# Patient Record
Sex: Male | Born: 1935
Health system: Southern US, Community
[De-identification: ages and names within clinical notes are randomized; demographics above are authoritative.]

## PROBLEM LIST (undated history)

## (undated) DIAGNOSIS — I350 Nonrheumatic aortic (valve) stenosis: Secondary | ICD-10-CM

## (undated) DIAGNOSIS — Z87442 Personal history of urinary calculi: Secondary | ICD-10-CM

## (undated) DIAGNOSIS — C801 Malignant (primary) neoplasm, unspecified: Secondary | ICD-10-CM

## (undated) DIAGNOSIS — E785 Hyperlipidemia, unspecified: Secondary | ICD-10-CM

## (undated) DIAGNOSIS — E1140 Type 2 diabetes mellitus with diabetic neuropathy, unspecified: Secondary | ICD-10-CM

## (undated) DIAGNOSIS — I10 Essential (primary) hypertension: Secondary | ICD-10-CM

## (undated) DIAGNOSIS — M1990 Unspecified osteoarthritis, unspecified site: Secondary | ICD-10-CM

## (undated) DIAGNOSIS — I2510 Atherosclerotic heart disease of native coronary artery without angina pectoris: Secondary | ICD-10-CM

## (undated) DIAGNOSIS — K219 Gastro-esophageal reflux disease without esophagitis: Secondary | ICD-10-CM

## (undated) DIAGNOSIS — I679 Cerebrovascular disease, unspecified: Secondary | ICD-10-CM

## (undated) DIAGNOSIS — I219 Acute myocardial infarction, unspecified: Secondary | ICD-10-CM

## (undated) DIAGNOSIS — I509 Heart failure, unspecified: Secondary | ICD-10-CM

## (undated) DIAGNOSIS — C4490 Unspecified malignant neoplasm of skin, unspecified: Secondary | ICD-10-CM

## (undated) HISTORY — DX: Essential (primary) hypertension: I10

## (undated) HISTORY — DX: Acute myocardial infarction, unspecified: I21.9

## (undated) HISTORY — DX: Type 2 diabetes mellitus with diabetic neuropathy, unspecified: E11.40

## (undated) HISTORY — DX: Unspecified malignant neoplasm of skin, unspecified: C44.90

## (undated) HISTORY — DX: Cerebrovascular disease, unspecified: I67.9

## (undated) HISTORY — DX: Hyperlipidemia, unspecified: E78.5

## (undated) HISTORY — DX: Nonrheumatic aortic (valve) stenosis: I35.0

## (undated) HISTORY — PX: CATARACT EXTRACTION, BILATERAL: SHX1313

## (undated) HISTORY — DX: Atherosclerotic heart disease of native coronary artery without angina pectoris: I25.10

---

## 1998-03-07 ENCOUNTER — Inpatient Hospital Stay (HOSPITAL_COMMUNITY): Admission: EM | Admit: 1998-03-07 | Discharge: 1998-03-11 | Payer: Self-pay | Admitting: *Deleted

## 2000-10-03 ENCOUNTER — Ambulatory Visit (HOSPITAL_COMMUNITY): Admission: RE | Admit: 2000-10-03 | Discharge: 2000-10-03 | Payer: Self-pay | Admitting: Cardiology

## 2000-10-03 ENCOUNTER — Encounter: Payer: Self-pay | Admitting: Cardiology

## 2004-10-05 ENCOUNTER — Inpatient Hospital Stay (HOSPITAL_COMMUNITY): Admission: EM | Admit: 2004-10-05 | Discharge: 2004-10-08 | Payer: Self-pay | Admitting: *Deleted

## 2004-10-05 ENCOUNTER — Ambulatory Visit: Payer: Self-pay | Admitting: Pulmonary Disease

## 2005-01-21 ENCOUNTER — Ambulatory Visit: Payer: Self-pay | Admitting: Cardiology

## 2005-11-25 ENCOUNTER — Ambulatory Visit: Payer: Self-pay

## 2005-11-25 ENCOUNTER — Ambulatory Visit: Payer: Self-pay | Admitting: Cardiology

## 2005-12-11 ENCOUNTER — Ambulatory Visit (HOSPITAL_COMMUNITY): Admission: RE | Admit: 2005-12-11 | Discharge: 2005-12-11 | Payer: Self-pay | Admitting: Vascular Surgery

## 2005-12-11 HISTORY — PX: OTHER SURGICAL HISTORY: SHX169

## 2006-01-27 ENCOUNTER — Ambulatory Visit: Payer: Self-pay | Admitting: Cardiology

## 2006-12-09 ENCOUNTER — Ambulatory Visit: Payer: Self-pay | Admitting: Cardiology

## 2006-12-18 ENCOUNTER — Ambulatory Visit: Payer: Self-pay

## 2007-12-31 ENCOUNTER — Ambulatory Visit: Payer: Self-pay | Admitting: Cardiology

## 2008-01-12 ENCOUNTER — Ambulatory Visit (HOSPITAL_COMMUNITY): Admission: RE | Admit: 2008-01-12 | Discharge: 2008-01-12 | Payer: Self-pay | Admitting: Cardiology

## 2008-10-26 ENCOUNTER — Encounter (INDEPENDENT_AMBULATORY_CARE_PROVIDER_SITE_OTHER): Payer: Self-pay | Admitting: *Deleted

## 2008-12-23 DIAGNOSIS — Z8679 Personal history of other diseases of the circulatory system: Secondary | ICD-10-CM | POA: Insufficient documentation

## 2008-12-23 DIAGNOSIS — E785 Hyperlipidemia, unspecified: Secondary | ICD-10-CM | POA: Insufficient documentation

## 2008-12-23 DIAGNOSIS — I10 Essential (primary) hypertension: Secondary | ICD-10-CM | POA: Insufficient documentation

## 2008-12-30 ENCOUNTER — Ambulatory Visit: Payer: Self-pay | Admitting: Cardiology

## 2009-01-05 ENCOUNTER — Telehealth (INDEPENDENT_AMBULATORY_CARE_PROVIDER_SITE_OTHER): Payer: Self-pay | Admitting: *Deleted

## 2009-01-09 ENCOUNTER — Encounter: Payer: Self-pay | Admitting: Cardiovascular Disease

## 2009-01-09 ENCOUNTER — Ambulatory Visit: Payer: Self-pay

## 2009-01-09 ENCOUNTER — Encounter: Payer: Self-pay | Admitting: Cardiology

## 2009-07-12 ENCOUNTER — Encounter: Payer: Self-pay | Admitting: Cardiology

## 2009-07-13 ENCOUNTER — Encounter: Payer: Self-pay | Admitting: Cardiology

## 2009-07-13 ENCOUNTER — Ambulatory Visit: Payer: Self-pay

## 2010-01-25 DIAGNOSIS — I2510 Atherosclerotic heart disease of native coronary artery without angina pectoris: Secondary | ICD-10-CM | POA: Insufficient documentation

## 2010-01-29 ENCOUNTER — Encounter: Payer: Self-pay | Admitting: Cardiology

## 2010-01-31 ENCOUNTER — Ambulatory Visit: Payer: Self-pay

## 2010-01-31 ENCOUNTER — Ambulatory Visit: Payer: Self-pay | Admitting: Cardiology

## 2010-07-03 NOTE — Miscellaneous (Signed)
Summary: Orders Update  Clinical Lists Changes  Orders: Added new Test order of Carotid Duplex (Carotid Duplex) - Signed 

## 2010-07-03 NOTE — Assessment & Plan Note (Signed)
Summary: 1 YR/DMP   Visit Type:  1 yr f/u Primary Provider:  Kari Baars  CC:  pt had bilateral cataract surgery back in June 2011...denies any cardiac complaints today.  History of Present Illness: William Walker returns today for evaluation management of his coronary disease and cerebrovascular disease.  He has no angina or ischemic symptoms. He denies symptoms of TIAs or mini strokes. I reviewed these at length with him. Carotid Dopplers today shows stability with this chronic left internal carotid artery occlusion and no flow in the left vertebral.  He still quite active playing golf and farming.  Current Medications (verified): 1)  Lisinopril 20 Mg Tabs (Lisinopril) .Marland Kitchen.. 1 Tab Once Daily 2)  Prevacid 30 Mg Cpdr (Lansoprazole) .... 1/2 Tab Once Daily 3)  Aspirin Ec 325 Mg Tbec (Aspirin) .... Take One Tablet By Mouth Daily 4)  Pravachol 40 Mg Tabs (Pravastatin Sodium) .Marland Kitchen.. 1 Tab Once Daily 5)  Multivitamins   Tabs (Multiple Vitamin) .Marland Kitchen.. 1 Tab Once Daily  Allergies: 1)  ! Pcn  Past History:  Past Medical History: Last updated: 01/25/2010 CAD, NATIVE VESSEL (ICD-414.01) MYOCARDIAL INFARCTION, ANTERIOR WALL/..03/1998 (ICD-410.10) CEREBROVASCULAR DISEASE, HX OF (ICD-V12.50) HYPERTENSION, UNSPECIFIED (ICD-401.9) HYPERLIPIDEMIA-MIXED (ICD-272.4)    Past Surgical History: Last updated: 12/23/2008 Arch cerebral arteriogram. SURGEON:  Larina Earthly, M.D...12/11/2005  Family History: Last updated: 12/23/2008 Family History of Coronary Artery Disease: Fatherin his 29's had CAD.Marland KitchenOtherwise noncontributory  Social History: Last updated: 12/23/2008 Never smoked.  No history of significant occupational exposures.  Clinical Reports Reviewed:  Carotid Doppler:  07/13/2009:  Impressions: Stable 60-79% RICA stenosis. Chronic LICA occlusion. Question of interval left vertebral artery occlusion.  Charlton Haws, MD  01/09/2009:  Impressions: Moderate plaque on the right. 60-79%  RICA stenosis. Chronic LICA occlusion.  Tonny Bollman, MD  01/12/2008:  US Carotid Duplex Bilat   Findings:    RIGHT CAROTID ARTERY: The right carotid bifurcation demonstrates   moderate echogenic and hypoechoic plaque formation.  By velocity   and ratio criteria as well as gray-scale imaging, the right ICA   stenosis is within the 50 - 69% range.  This is primarily based on   the elevated velocity of 155 cm/S. Wave form analysis demonstrates   no significant spectral broadening or turbulent flow.    RIGHT VERTEBRAL ARTERY:  Patent and antegrade    LEFT CAROTID ARTERY: No detectable flow.  Gray scale imaging as   well as Doppler evaluation demonstrates complete occlusion of the   left carotid artery.  This appears chronic.    LEFT VERTEBRAL ARTERY:  Patent and antegrade    IMPRESSION:   Moderate right ICA stenosis within the 50 - 69% range.   Complete chronic-appearing occlusion of the left ICA.   Both vertebral arteries are patent with antegrade flow.    Read By:  Sigurd Sos.,  M.D.   Released By:  Sigurd Sos.,  M.D.  11/25/2005:  Impressions: Mild plaque on the right, significant plaque on the left. 40-59% RICA stenosis 80-99% LICA stenosis with string-sign flow vs. LICA occlusion.  Randa Evens, MD  Nuclear Study:  01/09/2009:  Excerise capacity: Lexiscan  Blood Pressure response: Normal blood pressure response  Clinical symptoms: No chest pain  ECG impression: No significant ST segment change suggestive of ischemia  Overall impression: Normal stres nuclear study.  Dietrich Pates, MD, [facility]   12/18/2006:  Excerise capacity: Dobutamine study with no exercise  Blood Pressure response: Normal blood pressure response  Clinical symptoms: No chest pain  or dyspnea  ECG impression: No significant ST segment change suggestive of ischemia. Burst of SVT noted at peak.  Overall impression: Low risk dobutamine nuclear study as above.  Jonelle Sidle,  MD   11/01/2003:  Impression: Essentially normal Cardiolite study with thinning of the inferior apex but no transmural infarction or ischemia. Gated ejection fraction 66%.  Noralyn Pick. Eden Emms, MD, [facility]  10/03/2000:   REPORT:  CLINICAL DATA:  54-YEAR-OLD GENTLEMAN WITH PRIOR ANTERIOR MYOCARDIAL   INFARCTION TREATED WITH URGENT PERCUTANEOUS INTERVENTION;   HISTORY OF CONGESTIVE HEART FAILURE.   STRESS CARDIOLITE STUDY:   RADIONUCLIDE DATA:  ONE DAY REST/STRESS PROTOCOL PERFORMED WITH 10/29.7 MCI TC   65M SESTAMIBI.   STRESS DATA:  PLEASE SEE SEPARATE REPORT.  THE PATIENT EXERCISED TO 7   METS WITH ADEQUATE HEART RATE RESPONSE AND NO SIGNIFICANT   ELECTROCARDIOGRAPHIC ABNORMALITIES.   SCINTIGRAPHIC DATA:  ACQUISITION WAS TECHNICALLY GOOD WITH NORMAL LEFT   VENTRICULAR SIZE AND BORDERLINE RIGHT VENTRICULAR   ENLARGEMENT.  ON TOMOGRAPHIC IMAGES RECONSTRUCTED IN   STANDARD PLANES, THERE WAS THINNING OF THE INFERIOR WALL   THAT WAS NOT NUMERICALLY SIGNIFICANT BY QUANTITATIVE   ANALYSIS.  ALL OTHER SEGMENTS DEMONSTRATED NORMAL UPTAKE   AND DISTRIBUTION OF TRACER.  THE REST IMAGES WERE   UNCHANGED.  THE GATED 3D IMAGES REVEALED NORMAL REGIONAL  AND GLOBAL LEFT VENTRICULAR SYSTOLIC FUNCTION WITH  AN   ESTIMATED EJECTION FRACTION OF .62.  ON THE GATED   TOMOGRAPHIC IMAGES,  NORMAL SYSTOLIC ACCENTUATION OF   ACTIVITY WAS PRESENT THROUGHOUT.   IMPRESSION:  NEGATIVE STRESS CARDIOLITE STUDY WITH   SOMEWHAT IMPAIRED EXERCISE CAPACITY, NORMAL LEFT   VENTRICULAR SIZE AND FUNCTION AND NEITHER   ELECTROCARDIOGRAPHIC NOR SCINTIGRAPHIC EVIDENCE FOR   MYOCARDIAL ISCHEMIA OR INFARCTION.  OTHER FINDINGS AS   NOTED.  COMPARED WITH A PRIOR STUDY OF 09/13/98, ISCHEMIA   IN THE INFEROAPICAL AND ANTEROAPICAL REGIONS IS NO LONGER   EVIDENT.   TRANSCRIBED DATE:  MA  AS                                           Read ZO:[id number] M.   Dietrich Pates, M.D.  10/07/2000 16:14                                            Released VW:[id number]   M. Rothbart   Review of Systems       negative other than history of present illness  Vital Signs:  Patient profile:   77 year old male Height:      72 inches Weight:      213.8 pounds BMI:     29.10 Pulse rate:   67 / minute Pulse rhythm:   regular BP sitting:   130 / 74  (left arm) Cuff size:   large  Vitals Entered By: Danielle Rankin, CMA (January 31, 2010 11:08 AM)  Physical Exam  General:  Well developed, well nourished, in no acute distress. Head:  normocephalic and atraumatic Eyes:  PERRLA/EOM intact; conjunctiva and lids normal. Neck:  Neck supple, no JVD. No masses, thyromegaly or abnormal cervical nodes. Chest Wall:  no deformities or breast masses noted Lungs:  Clear bilaterally to auscultation and percussion. Heart:  PMI nondisplaced, regular rate and rhythm, normal  S1-S2 is split, no gallop, curve shows equal bilaterally with the soft right carotid bruit. Abdomen:  Bowel sounds positive; abdomen soft and non-tender without masses, organomegaly, or hernias noted. No hepatosplenomegaly. Msk:  decreased ROM.   Pulses:  pulses normal in all 4 extremities Extremities:  dependent rubor, mildly delayed capillary reflex, varicose veins without sign of DVT, trace edema Neurologic:  Alert and oriented x 3. Skin:  Intact without lesions or rashes. Psych:  Normal affect.   EKG  Procedure date:  01/31/2010  Findings:      normal sinus rhythm, normal EKG  Impression & Recommendations:  Problem # 1:  CAD, NATIVE VESSEL (ICD-414.01) Assessment Unchanged  His updated medication list for this problem includes:    Lisinopril 20 Mg Tabs (Lisinopril) .Marland Kitchen... 1 tab once daily    Aspirin Ec 325 Mg Tbec (Aspirin) .Marland Kitchen... Take one tablet by mouth daily  Orders: EKG w/ Interpretation (93000)  Problem # 2:  MYOCARDIAL INFARCTION, ANTERIOR WALL/..03/1998 (ICD-410.10) Assessment: Unchanged  His updated medication list for this problem includes:     Lisinopril 20 Mg Tabs (Lisinopril) .Marland Kitchen... 1 tab once daily    Aspirin Ec 325 Mg Tbec (Aspirin) .Marland Kitchen... Take one tablet by mouth daily  Problem # 3:  CEREBROVASCULAR DISEASE, HX OF (ICD-V12.50) Assessment: Unchanged  Problem # 4:  HYPERTENSION, UNSPECIFIED (ICD-401.9) Assessment: Improved  His updated medication list for this problem includes:    Lisinopril 20 Mg Tabs (Lisinopril) .Marland Kitchen... 1 tab once daily    Aspirin Ec 325 Mg Tbec (Aspirin) .Marland Kitchen... Take one tablet by mouth daily  Problem # 5:  HYPERLIPIDEMIA-MIXED (ICD-272.4)  His updated medication list for this problem includes:    Pravachol 40 Mg Tabs (Pravastatin sodium) .Marland Kitchen... 1 tab once daily  Patient Instructions: 1)  Your physician recommends that you schedule a follow-up appointment in: 1 year with Dr. Daleen Squibb and Carotid ultrasound 2)  Your physician recommends that you continue on your current medications as directed. Please refer to the Current Medication list given to you today.

## 2010-10-16 NOTE — Assessment & Plan Note (Signed)
[facility]                            CARDIOLOGY OFFICE NOTE   NAASIR, CARREIRA                        MRN:          [id number]  DATE:12/31/2007                            DOB:          [birth year]    Mr. William Walker returns today for the following problems:  1. Coronary artery disease status post large anterior wall infarct,      October 1999.  He has had full recovery of his left ventricular      function, EF is 66%.  Last stress Myoview December 18, 2006, with an EF      of 70%.  He did not like the dobutamine or like to do adenosine      next time.  He is having no signs of angina.  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  2. Hypertension, followed by Dr. Juanetta Gosling.  3. Hyperlipidemia.  He brings lipids from Dr. Juanetta Gosling' office, which      looked consistent and quite good.  4. History of cerebrovascular disease.  He is not having any symptoms      of transient ischemic attacks or stroke.  He has had a study in the      past which showed a widely patent right internal carotid artery,      large right vertebral with a 50% stenosis at the origin, occluded      left internal carotid, and occluded left vertebral.  Again, he is      asymptomatic.   CURRENT MEDICATIONS:  1. Lisinopril 20 mg a day.  2. Prevacid 30 mg a day.  3. Aspirin 325 a day.  4. Pravachol 40 mg a day.   PHYSICAL EXAMINATION:  VITAL SIGNS:  His blood pressure today is 135/79,  his pulse is 77 and regular.  His EKG is stable.  His weight is 215.  HEENT:  Unchanged.  Carotids upstrokes were equal bilaterally except for  slight decrease on the left.  There are no bruits.  Thyroid is not  enlarged.  Trachea is midline.  NECK:  Supple.  LUNGS:  Clear.  HEART:  Reveals a regular rate and rhythm.  No gallop or murmur.  ABDOMEN:  Soft, good bowel sounds.  No midline bruits.  EXTREMITIES:  No cyanosis, clubbing, or edema.  Pulses were present, but  reduced.  He has  trace edema.  NEURO:  Intact.   William Walker is doing well.  Other than the need for some screening carotid  Dopplers, we have made no other recommendations.  I will plan on seeing  him back again in a year.     Thomas C. Daleen Squibb, MD, [facility]  Electronically Signed   TCW/MedQ  DD: 12/31/2007  DT: 01/01/2008  Job #: [id number]   cc:   Ramon Dredge L. Juanetta Gosling, M.D.

## 2010-10-16 NOTE — Assessment & Plan Note (Signed)
[facility]                            CARDIOLOGY OFFICE NOTE   William Walker                        MRN:          [id number]  DATE:12/09/2006                            DOB:          02/27/1927    William Walker returns today for further management of the following issues:  1. Coronary artery disease, status post large anterior wall infarct,      October 1999.  He is having no symptoms of angina or ischemia.  He      had remarkable recovery of the LV function with an EF of 66% by      last Myoview.  He is due a stress followup.  He does not like      adenosine!  2. Hypertension.  3. Hyperlipidemia.  His lipids are remarkably good and at goal with      William Walker.  4. History of vascular disease.  He has had a cerebrovascular study by      William Walker, December 11, 2005, which showed a widely patent right      internal carotid artery, large right vertebral with a 50% stenosis      at the origin, and occluded left internal carotid artery, and an      occluded left vertebral.  He is asymptomatic.  Symptoms of a TIA      and stroke were reviewed with him today, as well as how to call      911.   His meds are Lisinopril 20 mg a day, Prevacid 30 mg a day, aspirin 325 a  day, Pravachol 40 mg a day, and Metanx which is a nutritional  supplement.   His blood pressure today is 140/76, pulse 67 and regular.  His weight is  213.  HEENT:  Normocephalic, atraumatic.  PERRLA, extraocular movements  intact.  Sclerae are clear.  Facial symmetry is normal.  Carotids are  full.  He has bilateral carotid sounds.  Thyroid is not enlarged,  trachea is midline.  LUNGS:  Clear.  HEART:  Reveals a regular rate and rhythm without murmur or gallop.  PMI  is nondisplaced.  ABDOMINAL EXAM:  Soft with good bowel sounds, no midline bruit.  EXTREMITIES:  Reveal no edema.  Pulses are intact.  NEURO EXAM:  Intact.  SKIN:  Shows a few ecchymoses.   Electrocardiogram shows sinus  rhythm, first degree AV block.  He has  some nonspecific ST segment changes inferiorly.  His __________ criteria  for LVH.  There has been no significant change.   ASSESSMENT AND PLAN:  William Walker is doing well.  I have asked him to  return for a dobutamine stress Myoview.  He does not like adenosine.  If  this is negative for ischemia, we will see him back in a year.     Thomas C. Daleen Squibb, MD, [facility]  Electronically Signed   TCW/MedQ  DD: 12/09/2006  DT: 12/10/2006  Job #: [id number]   cc:   William Walker, M.D.

## 2010-10-19 NOTE — Discharge Summary (Signed)
NAME:  William Walker, William Walker NO.:  [id number]   MEDICAL RECORD NO.:  [id number]          PATIENT TYPE:  INP   LOCATION:  3031                         FACILITY:  [facility]   PHYSICIAN:  Oley Balm. Sung Amabile, M.D. [facility] OF BIRTH:  [birth year]   DATE OF ADMISSION:  10/05/2004  DATE OF DISCHARGE:  10/08/2004                                 DISCHARGE SUMMARY   DISCHARGE DIAGNOSES:  1.  Urinary tract infection.  2.  History of kidney stones.  3.  Low-grade sepsis.   LABORATORY DATA:  On Oct 05, 2004, urinary culture assessment:  Greater than  100,000 colonies of Escherichia coli.  Sensitivities less than 0.25  sensitive to ciprofloxacin.  Blood culture date Oct 05, 2004 with 1 out of 2  cultures with gram-positive cocci in clusters, currently final culture date  pending.  Laboratory data date Oct 07, 2004:  Sodium 135, potassium 3.8,  chloride 107, CO2 24, glucose 89, BUN 14, creatinine 1.3. Complete blood  count date Oct 07, 2004.  White blood cell count 9.7, hemoglobin 11.8,  platelets 142.  On Oct 05, 2004, white blood cell count 11.3.  On Oct 05, 2004, BUN 21, creatinine 1.9.  Lactic acid 3.3.  INR 1.3.   BRIEF HISTORY:  William Walker is a 77 year old gentleman who usually enjoys good  health.  He presented with a 24-hour history of shaking chills and dysuria.  He was consequently brought to the emergency room on the morning of  admission for further evaluation.  Upon arrival he was noted to be  hypotensive and confused.  He received normal saline resuscitation with  improvement in his blood pressure and improvement in his cognitive function.  Upon arrival his temperature was 102.9.  he improved with Tylenol.  No other  complaints.  He denied any other significant pertinent positives for review  of systems.   PAST MEDICAL HISTORY:  Significant for:  1.  Myocardial infarction in 1999.  2.  Hypertension.  3.  Hyperlipidemia.  4.  Diabetes.  5.  Peripheral neuropathy.  6.  Remote  history of kidney stones.   He was admitted to the critical care service for low-grade sepsis and  urinary tract infection as well as volume response to septic shock.   HOSPITAL COURSE:  Problem #1.  Urinary tract infection.  Urinary cultures  were obtained.  Cultures were positive for E coli.  Sensitivities sensitive  to ciprofloxacin with less than 0.025 MIC.  The patient is now on day #3 of  Cipro.  Will go home on 14-day course.   Problem #2.  Low-grade sepsis.  Septic shock.  Easily resolved with volume  resuscitation.  BUN and creatinine are returning to baseline.  Vital signs  are now stable.   On physical examination on discharge, the patient has now been afebrile for  over 24 hours.  His temperature is 98.2, pulse 69,  respirations 18,  saturations 95% on room air.  Blood pressure 116/67.  Breath sounds are  clear.  Heart tones are normal.  Abdomen is soft, nontender.  Denies  dysuria.  Did have Foley catheter discontinued yesterday.  Does still  continue to complain of urinary frequency.   DISCHARGE MEDICATIONS:  1.  Ciprofloxacin 500 mg tablets one tablet twice a day x 11 more days.  2.  Aspirin 325 mg one a day.  3.  Prevacid 30 mg a day.   The patient following medications will be held and the patient has been  instructed to discuss with Dr. Juanetta Gosling upon further followup.  Glucotrol XL,  Lipitor, lisinopril.   ACTIVITY:  Without restrictions.   DIET:  No concentrated sweets.  The patient is instructed to check his  fasting blood glucose upon arising each morning.   FOLLOW UP:  The patient has been instructed to make followup appointment  with Dr. Juanetta Gosling in one week.  Phone number was provided.  We were unable to  make followup appointment for him as Dr. Juanetta Gosling' office is closed on  Monday.      PB/MEDQ  D:  10/08/2004  T:  10/08/2004  Job:  [id number]   cc:   Ramon Dredge L. Juanetta Gosling, M.D.  [address]  [place]  [place] [address]  Fax: [phone]

## 2010-10-19 NOTE — H&P (Signed)
NAME:  William Walker, William Walker NO.:  [id number]   MEDICAL RECORD NO.:  [id number]          PATIENT TYPE:  INP   LOCATION:  1824                         FACILITY:  [facility]   PHYSICIAN:  Oley Balm. Sung Amabile, M.D. [facility] OF BIRTH:  Jan 09, 1927   DATE OF ADMISSION:  10/05/2004  DATE OF DISCHARGE:                                HISTORY & PHYSICAL   ADMISSION DIAGNOSES:  1.  Urinary tract infection.  2.  History of kidney stones.  3.  Low grade sepsis.   HISTORY OF PRESENT ILLNESS:  William Walker is a 77 year old gentleman who usually  enjoys good health.  He presents with a 24-hour history of shaking chills  and dysuria.  He was consequently brought to the emergency department on the  morning of this admission for further evaluation.  Upon arrival he was noted  to be hypotensive and confused.  He received normal saline resuscitation  with improvement in his blood pressure and cognitive function.  Upon arrival  his temperature was 102.9 which has now improved with Tylenol.  He has no  other complaints.  At his baseline he enjoys robust health.  He denies sore  throat, neck stiffness, chest pain, shortness of breath, cough, sputum  production, hemoptysis, abdominal pain, nausea, vomiting, diarrhea, lower  extremity edema, and calf tenderness.   PAST MEDICAL HISTORY:  1.  Myocardial infarction in 1999.  2.  Hypertension.  3.  Hyperlipidemia.  4.  Diabetes.  5.  Peripheral neuropathy.  6.  Remote history of kidney stones.   CURRENT MEDICATIONS:  1.  Glucotrol XL 2.5 mg daily.  2.  Lipitor 10 mg daily.  3.  Lisinopril 40 mg daily.  4.  Prevacid 30 mg daily.  5.  Aspirin one daily.   SOCIAL HISTORY:  Never smoked.  No history of significant occupational  exposures.   FAMILY HISTORY:  Noncontributory.   REVIEW OF SYSTEMS:  As per the history of present illness.  Otherwise a  detailed review of systems is negative.   PHYSICAL EXAMINATION:  VITAL SIGNS:  Initial temperature  102.9, current  temperature 100-100.4.  Initial blood pressure 70/40, current blood pressure  and 98/60, pulse 96 (sinus rhythm), respirations 16, oxygen saturation 100%.  GENERAL:  He is well-developed, well-nourished, in no acute cardiac or  respiratory distress.  He is alert and oriented.  HEENT:  No acute abnormalities.  NECK:  Supple without adenopathy or jugular venous distension.  CHEST:  Full breath sounds without wheezes or other adventitious sounds.  CARDIAC:  Regular rate and rhythm.  No murmurs.  ABDOMEN:  Soft, nontender with normal bowel sounds.  EXTREMITIES:  Without clubbing, cyanosis, and edema.  Peripheral pulses are  full.  Capillary refill is normal.  NEUROLOGIC:  No focal deficits.   DATA:  Chest x-ray reveals no acute cardiac or respiratory disease.  EKG  reveals no acute ischemic changes.  Chemistries are pending.  Urinalysis  reveals too numerous to count white blood cells and many bacteria.  White  blood cell count is 11,300.   IMPRESSION:  1.  Urinary tract infection  with low grade sepsis.  2.  History of kidney stones.  3.  Type 2 diabetes.  4.  Hypertension and hyperlipidemia.  5.  History of myocardial infarction.   PLAN:  He will be admitted to the step-down unit and treated with  intravenous antibiotics - ciprofloxacin.  He did receive a dose of  vancomycin in the emergency department but I think we can hold on further  vancomycin for now pending culture results.  He will continue to receive  intravenous fluids for resuscitation.  Antihypertensives will be held.  The  rest of his medical regimen will be continued as is.  He will be covered  with sliding scale insulin.  DVT prophylaxis will be provided.  I do not  believe that he warrants the full sepsis protocol at this time given his  rapid improvement to intravenous fluids.  I do not believe he needs an ICU  level of care for the same reasons and therefore he is admitted to the step-  down  unit.      DBS/MEDQ  D:  10/05/2004  T:  10/05/2004  Job:  [id number]   cc:   Ramon Dredge L. Juanetta Gosling, M.D.  [address]  [place]  [place] [address]  Fax: [phone]

## 2010-10-19 NOTE — Assessment & Plan Note (Signed)
[facility]                              CARDIOLOGY OFFICE NOTE   RAAHIL, ONG                        MRN:          [id number]  DATE:01/27/2006                            DOB:          Mar 28, 1927    William Walker returns today after being evaluated for cerebrovascular disease by  Dr. Gretta Began.  Please see the angiogram report from December 11, 2005.  He is  a widely patent right internal carotid, large right vertebral with a 50%  stenosis at the origin and occluded left internal carotid and an occluded  left vertebral.   He is currently asymptomatic.  We are treating him medically.   His blood pressure today is 146/82, pulse 84 and regular. His weight is 219.  The rest of his exam is unchanged.   I reviewed extensively with Mr. and Mrs. Busey the symptoms of an acute  ischemic stroke.  He knows how to react to this, calling 911.   We will plan on seeing him back again in May 2008.  At that time he will  need a stress Myoview.  We made no changes in his medical program.  Dr.  Juanetta Gosling continues to follow his blood work.                               Thomas C. Daleen Squibb, MD, [facility]    TCW/MedQ  DD:  01/27/2006  DT:  01/28/2006  Job #:  [id number]   cc:   Larina Earthly, MD  [place] Deputy Juanetta Gosling, MD

## 2010-10-19 NOTE — Op Note (Signed)
NAME:  KOTARO, BUER NO.:  [id number]   MEDICAL RECORD NO.:  [id number]          PATIENT TYPE:  AMB   LOCATION:  SDS                          FACILITY:  [facility]   PHYSICIAN:  Larina Earthly, M.D.    DATE OF BIRTH:  1927-04-17   DATE OF PROCEDURE:  12/11/2005  DATE OF DISCHARGE:                                 OPERATIVE REPORT   PREOPERATIVE DIAGNOSIS:  Asymptomatic extracranial cerebrovascular occlusive  disease.   POSTOPERATIVE DIAGNOSIS:  Asymptomatic extracranial cerebrovascular  occlusive disease.   PROCEDURES:  Arch cerebral arteriogram.   SURGEON:  Larina Earthly, M.D.   ANESTHESIA:  1% lidocaine local.   COMPLICATIONS:  None.   DISPOSITION:  To holding area, stable.   PROCEDURE IN DETAIL:  The patient was taken to the peripheral vascular cath  lab and placed in supine position, where the area of the right and left  groins were prepped and draped in usual sterile fashion.  Using local  anesthesia and a single-wall puncture, the right common femoral artery was  entered and a guidewire was passed up to the level of the aortic arch.  A 5-  French sheath was passed over the guidewire and a pigtail catheter was  positioned at the level of the ascending aorta.  A 40-degree LAO projection  was undertaken.  This revealed the bovine arch was widely patent great  vessels proximally.  There was occlusion of the left vertebral artery, a  large right vertebral artery with a moderate 50% stenosis at its origin.  A  Simmons catheter was used to selectively catheterize the innominate artery.  AP and lateral projections were undertaken, again showing a large vertebral  artery with moderate proximal stenosis.  The intracranial injection revealed  excellent cross-filling from right to left and intracranial views will be  dictated as a separate note by [facility].  The left common  carotid artery was then selectively catheterized with the [facility] catheter.  AP  and lateral cervical and intracranial views were undertaken.  This  revealed complete occlusion of the internal carotid artery at its origin.  There was large external carotid with no good intracranial feeling with the  common carotid injection.  Next, the right lateral projections were  undertaken, revealing widely patent common carotid, internal and external  carotid arteries.  The patient tolerated the procedure without immediate  complications and was transferred to the holding area in stable condition.   FINDINGS:  1.  Widely patent right internal carotid artery.  2.  Large right vertebral artery with 50% stenosis at its origin.  3.  Occluded internal carotid artery on the left.  4.  Occluded left vertebral artery.      Larina Earthly, M.D.  Electronically Signed     TFE/MEDQ  D:  12/11/2005  T:  12/11/2005  Job:  [id number]   cc:   Thomas C. Wall, M.D.  [address]  [address]  [place]  [place] [address]   Oneal Deputy. Juanetta Gosling, M.D.  Fax: [phone]

## 2011-01-24 ENCOUNTER — Encounter: Payer: Self-pay | Admitting: Cardiology

## 2011-02-11 ENCOUNTER — Other Ambulatory Visit: Payer: Self-pay | Admitting: Cardiology

## 2011-02-11 DIAGNOSIS — I6529 Occlusion and stenosis of unspecified carotid artery: Secondary | ICD-10-CM

## 2011-02-12 ENCOUNTER — Ambulatory Visit (INDEPENDENT_AMBULATORY_CARE_PROVIDER_SITE_OTHER): Payer: Medicare Other | Admitting: Cardiology

## 2011-02-12 ENCOUNTER — Encounter: Payer: Self-pay | Admitting: Cardiology

## 2011-02-12 ENCOUNTER — Encounter (INDEPENDENT_AMBULATORY_CARE_PROVIDER_SITE_OTHER): Payer: Medicare Other | Admitting: *Deleted

## 2011-02-12 VITALS — BP 146/76 | HR 70 | Ht 71.0 in | Wt 211.0 lb

## 2011-02-12 DIAGNOSIS — I2510 Atherosclerotic heart disease of native coronary artery without angina pectoris: Secondary | ICD-10-CM

## 2011-02-12 DIAGNOSIS — Z8679 Personal history of other diseases of the circulatory system: Secondary | ICD-10-CM

## 2011-02-12 DIAGNOSIS — I6529 Occlusion and stenosis of unspecified carotid artery: Secondary | ICD-10-CM

## 2011-02-12 MED ORDER — NITROGLYCERIN 0.4 MG/SPRAY TL SOLN: 1.0000 | Status: DC | PRN

## 2011-02-12 NOTE — Assessment & Plan Note (Signed)
Stable. Symptoms reviewed. How to respond also reinforced.

## 2011-02-12 NOTE — Assessment & Plan Note (Signed)
Stable. Continue current medications. Nitroglycerin updated.

## 2011-02-12 NOTE — Progress Notes (Signed)
HPI William Walker returns today for evaluation and management coronary artery disease history of MI. He also has a history of severe true vascular disease has been stable. Carotid Dopplers today are stable. He is a chronically occluded left internal carotid artery 60-79% right internal carotid artery stenosis, and no flow in his left vertebral. Followup recommended in one year. He is having no symptoms of TIAs.  He denies any symptoms of angina or any chest discomfort. He remains very active. Past Medical History  Diagnosis Date  . CAD in native artery   . MI (myocardial infarction)   . CVD (cerebrovascular disease)   . HTN (hypertension)   . Hyperlipidemia     Past Surgical History  Procedure Date  . Arch cerebral ateriogram 12/11/2005    by Larina Earthly MD    Family History  Problem Relation Age of Onset  . Coronary artery disease Father     History   Social History  . Marital Status: Married    Spouse Name: N/A    Number of Children: N/A  . Years of Education: N/A   Occupational History  . Not on file.   Social History Main Topics  . Smoking status: Never Smoker   . Smokeless tobacco: Not on file  . Alcohol Use: Not on file  . Drug Use: Not on file  . Sexually Active: Not on file   Other Topics Concern  . Not on file   Social History Narrative  . No narrative on file    Allergies  Allergen Reactions  . Penicillins     Current Outpatient Prescriptions  Medication Sig Dispense Refill  . aspirin EC 325 MG tablet Take 325 mg by mouth daily.        . lansoprazole (PREVACID) 30 MG capsule Take 15 mg by mouth daily.        Marland Kitchen lisinopril (PRINIVIL,ZESTRIL) 20 MG tablet Take 20 mg by mouth daily.        . pravastatin (PRAVACHOL) 40 MG tablet Take 40 mg by mouth daily.          ROS Negative other than HPI.   PE General Appearance: well developed, well nourished in no acute distress HEENT: symmetrical face, PERRLA, good dentition  Neck: no JVD, thyromegaly, or  adenopathy, trachea midline Chest: symmetric without deformity Cardiac: PMI non-displaced, RRR, normal S1, S2, no gallop or murmur Lung: clear to ausculation and percussion Vascular: right carotid bruit  Abdominal: nondistended, nontender, good bowel sounds, no HSM, no bruits Extremities: no cyanosis, clubbing or edema, no sign of DVT, no varicosities  Skin: normal color, no rashes Neuro: alert and oriented x 3, non-focal Pysch: normal affect Filed Vitals:   02/12/11 1011  BP: 146/76  Pulse: 70  Height: 5\' 11"  (1.803 m)  Weight: 211 lb (95.709 kg)    EKG  Labs and Studies Reviewed.   No results found for this basename: WBC, HGB, HCT, MCV, PLT      Chemistry   No results found for this basename: NA, K, CL, CO2, BUN, CREATININE, GLU   No results found for this basename: CALCIUM, ALKPHOS, AST, ALT, BILITOT       No results found for this basename: CHOL   No results found for this basename: HDL   No results found for this basename: LDLCALC   No results found for this basename: TRIG   No results found for this basename: CHOLHDL   No results found for this basename: HGBA1C   No  results found for this basename: ALT, AST, GGT, ALKPHOS, BILITOT   No results found for this basename: TSH

## 2011-02-12 NOTE — Patient Instructions (Addendum)
Your physician recommends that you schedule a follow-up appointment in: 1 year with Dr. Daleen Squibb  Your physician has requested that you have a carotid duplex. This test is an ultrasound of the carotid arteries in your neck. It looks at blood flow through these arteries that supply the brain with blood. Allow one hour for this exam. There are no restrictions or special instructions. September 2013

## 2012-03-19 ENCOUNTER — Telehealth: Payer: Self-pay | Admitting: *Deleted

## 2012-03-19 DIAGNOSIS — I6529 Occlusion and stenosis of unspecified carotid artery: Secondary | ICD-10-CM

## 2012-03-19 NOTE — Telephone Encounter (Signed)
I spoke with pt today about upcoming appointment 04/01/12 with Dr. Daleen Squibb. He is due carotids. Scheduled for 03/26/12 carotids orders placed Mylo Red RN

## 2012-03-26 ENCOUNTER — Encounter (INDEPENDENT_AMBULATORY_CARE_PROVIDER_SITE_OTHER): Payer: Medicare Other

## 2012-03-26 DIAGNOSIS — I6529 Occlusion and stenosis of unspecified carotid artery: Secondary | ICD-10-CM

## 2012-04-01 ENCOUNTER — Ambulatory Visit (INDEPENDENT_AMBULATORY_CARE_PROVIDER_SITE_OTHER): Payer: Medicare Other | Admitting: Cardiology

## 2012-04-01 ENCOUNTER — Encounter: Payer: Self-pay | Admitting: Cardiology

## 2012-04-01 VITALS — BP 140/73 | HR 73 | Ht 72.0 in | Wt 207.0 lb

## 2012-04-01 DIAGNOSIS — G609 Hereditary and idiopathic neuropathy, unspecified: Secondary | ICD-10-CM

## 2012-04-01 DIAGNOSIS — I6529 Occlusion and stenosis of unspecified carotid artery: Secondary | ICD-10-CM

## 2012-04-01 DIAGNOSIS — I2510 Atherosclerotic heart disease of native coronary artery without angina pectoris: Secondary | ICD-10-CM

## 2012-04-01 DIAGNOSIS — E785 Hyperlipidemia, unspecified: Secondary | ICD-10-CM

## 2012-04-01 DIAGNOSIS — I739 Peripheral vascular disease, unspecified: Secondary | ICD-10-CM | POA: Insufficient documentation

## 2012-04-01 DIAGNOSIS — I779 Disorder of arteries and arterioles, unspecified: Secondary | ICD-10-CM | POA: Insufficient documentation

## 2012-04-01 DIAGNOSIS — G629 Polyneuropathy, unspecified: Secondary | ICD-10-CM | POA: Insufficient documentation

## 2012-04-01 DIAGNOSIS — I10 Essential (primary) hypertension: Secondary | ICD-10-CM

## 2012-04-01 NOTE — Assessment & Plan Note (Signed)
Stable. Continue secondary preventative therapy. 

## 2012-04-01 NOTE — Patient Instructions (Addendum)
Your physician recommends that you continue on your current medications as directed. Please refer to the Current Medication list given to you today.  Your physician has requested that you have a carotid duplex in 6 months.. This test is an ultrasound of the carotid arteries in your neck. It looks at blood flow through these arteries that supply the brain with blood. Allow one hour for this exam. There are no restrictions or special instructions.  Your physician wants you to follow-up in: 1 year with Dr. Daleen Squibb.  You will receive a reminder letter in the mail two months in advance. If you don't receive a letter, please call our office to schedule the follow-up appointment.

## 2012-04-01 NOTE — Assessment & Plan Note (Signed)
Asymptomatic but some progression on recent study. Repeat study in 6 months. Continue secondary preventative therapy.

## 2012-04-01 NOTE — Assessment & Plan Note (Signed)
This is most likely secondary to age and small vessel disease. I've advised him to wear shoes at all times other than in the house. I've also advised him to not trim his toenails too close.

## 2012-04-01 NOTE — Assessment & Plan Note (Signed)
Plan per note under  peripheral neuropathy.

## 2012-04-01 NOTE — Progress Notes (Signed)
HPI William Walker returns today for evaluation and management of his history of coronary artery disease, history of anterior William Walker infarction and percutaneous intervention. He also has carotid disease. Recent carotids showed slight progression of his disease. He is totally asymptomatic.  He denies any angina or symptoms of ischemia. He is compliant with his meds. Denies orthopnea, PND or edema.  His biggest complaint is the fact that his feet got burned and healed at the beach. He denies any claudication or any tissue injury otherwise. He does have significant dependent rubor and he has a nonspecific neuropathy with tingling and numbness in his feet.  Past Medical History  Diagnosis Date  . CAD in native artery   . MI (myocardial infarction)   . CVD (cerebrovascular disease)   . HTN (hypertension)   . Hyperlipidemia     Current Outpatient Prescriptions  Medication Sig Dispense Refill  . aspirin EC 325 MG tablet Take 325 mg by mouth daily.        . lansoprazole (PREVACID) 30 MG capsule Take 15 mg by mouth daily.        Marland Kitchen lisinopril (PRINIVIL,ZESTRIL) 20 MG tablet Take 20 mg by mouth daily.        . pravastatin (PRAVACHOL) 40 MG tablet Take 40 mg by mouth daily.        . traMADol (ULTRAM) 50 MG tablet Take 50 mg by mouth every 6 (six) hours as needed.        Allergies  Allergen Reactions  . Penicillins     Family History  Problem Relation Age of Onset  . Coronary artery disease Father     History   Social History  . Marital Status: Married    Spouse Name: N/A    Number of Children: N/A  . Years of Education: N/A   Occupational History  . Not on file.   Social History Main Topics  . Smoking status: Never Smoker   . Smokeless tobacco: Not on file  . Alcohol Use: Not on file  . Drug Use: Not on file  . Sexually Active: Not on file   Other Topics Concern  . Not on file   Social History Narrative  . No narrative on file    ROS ALL NEGATIVE EXCEPT THOSE NOTED IN  HPI  PE  General Appearance: well developed, well nourished in no acute distress HEENT: symmetrical face, PERRLA, good dentition  Neck: no JVD, thyromegaly, or adenopathy, trachea midline Chest: symmetric without deformity Cardiac: PMI non-displaced, RRR, normal S1, S2, no gallop or murmur Lung: clear to ausculation and percussion Vascular: all pulses full without bruits  Abdominal: nondistended, nontender, good bowel sounds, no HSM, no bruits Extremities: no cyanosis, clubbing or edema, no sign of DVT, no varicosities, dependent rubor with cool toes but good dorsalis pedis and posterior tibial pulses bilaterally. He has decreased capillary refill.  Skin: normal color, no rashes Neuro: alert and oriented x 3, non-focal Pysch: normal affect  EKG Normal sinus rhythm with first-degree AV block, left axis deviation, no change. BMET No results found for this basename: na, k, cl, co2, glucose, bun, creatinine, calcium, gfrnonaa, gfraa    Lipid Panel  No results found for this basename: chol, trig, hdl, cholhdl, vldl, ldlcalc    CBC No results found for this basename: wbc, rbc, hgb, hct, plt, mcv, mch, mchc, rdw, neutrabs, lymphsabs, monoabs, eosabs, basosabs

## 2012-12-03 ENCOUNTER — Encounter (HOSPITAL_COMMUNITY): Payer: Self-pay

## 2012-12-03 ENCOUNTER — Encounter (HOSPITAL_COMMUNITY): Payer: Self-pay | Admitting: Pharmacy Technician

## 2012-12-03 ENCOUNTER — Encounter (HOSPITAL_COMMUNITY)
Admission: RE | Admit: 2012-12-03 | Discharge: 2012-12-03 | Disposition: A | Discharge status: Home / Self Care | Payer: Medicare Other | Source: Ambulatory Visit | Attending: General Surgery | Admitting: General Surgery

## 2012-12-03 HISTORY — DX: Gastro-esophageal reflux disease without esophagitis: K21.9

## 2012-12-03 LAB — CBC WITH DIFFERENTIAL/PLATELET
Basophils Relative: 0 % (ref 0–1)
Eosinophils Absolute: 0.5 10*3/uL (ref 0.0–0.7)
HCT: 40.8 % (ref 39.0–52.0)
Hemoglobin: 14 g/dL (ref 13.0–17.0)
Lymphs Abs: 2.7 10*3/uL (ref 0.7–4.0)
MCH: 34.7 pg — ABNORMAL HIGH (ref 26.0–34.0)
MCHC: 34.3 g/dL (ref 30.0–36.0)
Monocytes Absolute: 0.5 10*3/uL (ref 0.1–1.0)
Monocytes Relative: 7 % (ref 3–12)
RBC: 4.03 MIL/uL — ABNORMAL LOW (ref 4.22–5.81)

## 2012-12-03 LAB — BASIC METABOLIC PANEL
BUN: 26 mg/dL — ABNORMAL HIGH (ref 6–23)
CO2: 27 mEq/L (ref 19–32)
Chloride: 101 mEq/L (ref 96–112)
Glucose, Bld: 187 mg/dL — ABNORMAL HIGH (ref 70–99)
Potassium: 4.4 mEq/L (ref 3.5–5.1)

## 2012-12-03 NOTE — H&P (Signed)
  NTS SOAP Note  Vital Signs:  Vitals as of: 12/03/2012: Systolic 135: Diastolic 72: Heart Rate 81: Temp 99.35F: Height 55ft 10in: Weight 208Lbs 0 Ounces: Pain Level 5: BMI 29.84  BMI : 29.84 kg/m2  Subjective: This 4 Years 4 Months old Male presents for of    HERNIA: ,Has had a left inguinal hernia for a long time, but recently it has become difficult to reduce.  Causes some mild discomfort.  Review of Symptoms:  Constitutional:unremarkable   Head:unremarkable    Eyes:unremarkable   Nose/Mouth/Throat:unremarkable Cardiovascular:  unremarkable   Respiratory:unremarkable   Gastrointestinal:  unremarkable   Genitourinary:unremarkable     Skin:unremarkable Breast:  negative Allergic/Immunologic:unremarkable     Past Medical History:    Reviewed   Past Medical History  Surgical History: none Medical Problems: carotid artery occlussion on left, HTN, high cholesterol, coronary stent placement in 1999 Allergies: PCN Medications: lisinopril, prevachol, tramadol, prevacid   Social History:Reviewed  Social History  Preferred Language: English Race:  White Ethnicity: Not Hispanic / Latino Age: 79 Years 4 Months Marital Status:  S Alcohol:  No Recreational drug(s):  No   Smoking Status: Never smoker reviewed on 12/03/2012 Functional Status reviewed on mm/dd/yyyy ------------------------------------------------ Bathing: Normal Cooking: Normal Dressing: Normal Driving: Normal Eating: Normal Managing Meds: Normal Oral Care: Normal Shopping: Normal Toileting: Normal Transferring: Normal Walking: Normal Cognitive Status reviewed on mm/dd/yyyy ------------------------------------------------ Attention: Normal Decision Making: Normal Language: Normal Memory: Normal Motor: Normal Perception: Normal Problem Solving: Normal Visual and Spatial: Normal   Family History:  Reviewed  Family Health History Mother, Deceased;  Healthy;  Father, Deceased; Healthy;     Objective Information: General:  Well appearing, well nourished in no distress.   No right carotid bruit.  No pulse in left carotid. Heart:  RRR, no murmur or gallop.  Normal S1, S2.  No S3, S4.  Lungs:    CTA bilaterally, no wheezes, rhonchi, rales.  Breathing unlabored. Abdomen:Soft, NT/ND, no HSM, no masses.  Large reducible left inguinal hernia noted. WU:[id number]    Assessment:Left inguinal hernia  Diagnosis &amp; Procedure Smart Code   Plan:Scheduled for left inguinal herniorrhaphy on 12/09/12.   Patient Education:Alternative treatments to surgery were discussed with patient (and family).  Risks and benefits  of procedure including bleeding, infection, recurrence, and cardiopulmonary difficulties were fully explained to the patient (and family) who gave informed consent. Patient/family questions were addressed.  To hold ASA.  Follow-up:Pending Surgery

## 2012-12-03 NOTE — Patient Instructions (Addendum)
William Walker  12/03/2012   Your procedure is scheduled on:  12/09/2012  Report to [facility] at  810  AM.  Call this number if you have problems the morning of surgery: [phone]   Remember:   Do not eat food or drink liquids after midnight.   Take these medicines the morning of surgery with A SIP OF WATER:  Lisinopril, ultram   Do not wear jewelry, make-up or nail polish.  Do not wear lotions, powders, or perfumes.   Do not shave 48 hours prior to surgery. Men may shave face and neck.  Do not bring valuables to the hospital.  [facility] is not responsible for any belongings or valuables.  Contacts, dentures or bridgework may not be worn into surgery.  Leave suitcase in the car. After surgery it may be brought to your room.  For patients admitted to the hospital, checkout time is 11:00 AM the day of discharge.   Patients discharged the day of surgery will not be allowed to drive home.  Name and phone number of your driver: family  Special Instructions: Shower using CHG 2 nights before surgery and the night before surgery.  If you shower the day of surgery use CHG.  Use special wash - you have one bottle of CHG for all showers.  You should use approximately 1/3 of the bottle for each shower.   Please read over the following fact sheets that you were given: Pain Booklet, Coughing and Deep Breathing, Surgical Site Infection Prevention, Anesthesia Post-op Instructions and Care and Recovery After Surgery Hernia A hernia occurs when an internal organ pushes out through a weak spot in the abdominal wall. Hernias most commonly occur in the groin and around the navel. Hernias often can be pushed back into place (reduced). Most hernias tend to get worse over time. Some abdominal hernias can get stuck in the opening (irreducible or incarcerated hernia) and cannot be reduced. An irreducible abdominal hernia which is tightly squeezed into the opening is at risk for impaired blood supply  (strangulated hernia). A strangulated hernia is a medical emergency. Because of the risk for an irreducible or strangulated hernia, surgery may be recommended to repair a hernia. CAUSES   Heavy lifting.  Prolonged coughing.  Straining to have a bowel movement.  A cut (incision) made during an abdominal surgery. HOME CARE INSTRUCTIONS   Bed rest is not required. You may continue your normal activities.  Avoid lifting more than 10 pounds (4.5 kg) or straining.  Cough gently. If you are a smoker it is best to stop. Even the best hernia repair can break down with the continual strain of coughing. Even if you do not have your hernia repaired, a cough will continue to aggravate the problem.  Do not wear anything tight over your hernia. Do not try to keep it in with an outside bandage or truss. These can damage abdominal contents if they are trapped within the hernia sac.  Eat a normal diet.  Avoid constipation. Straining over long periods of time will increase hernia size and encourage breakdown of repairs. If you cannot do this with diet alone, stool softeners may be used. SEEK IMMEDIATE MEDICAL CARE IF:   You have a fever.  You develop increasing abdominal pain.  You feel nauseous or vomit.  Your hernia is stuck outside the abdomen, looks discolored, feels hard, or is tender.  You have any changes in your bowel habits or in the hernia  that are unusual for you.  You have increased pain or swelling around the hernia.  You cannot push the hernia back in place by applying gentle pressure while lying down. MAKE SURE YOU:   Understand these instructions.  Will watch your condition.  Will get help right away if you are not doing well or get worse. Document Released: 05/20/2005 Document Revised: 08/12/2011 Document Reviewed: 01/07/2008 [facility] Patient Information 2014 [place], [place]. PATIENT INSTRUCTIONS POST-ANESTHESIA  IMMEDIATELY FOLLOWING SURGERY:  Do not drive or operate  machinery for the first twenty four hours after surgery.  Do not make any important decisions for twenty four hours after surgery or while taking narcotic pain medications or sedatives.  If you develop intractable nausea and vomiting or a severe headache please notify your doctor immediately.  FOLLOW-UP:  Please make an appointment with your surgeon as instructed. You do not need to follow up with anesthesia unless specifically instructed to do so.  WOUND CARE INSTRUCTIONS (if applicable):  Keep a dry clean dressing on the anesthesia/puncture wound site if there is drainage.  Once the wound has quit draining you may leave it open to air.  Generally you should leave the bandage intact for twenty four hours unless there is drainage.  If the epidural site drains for more than 36-48 hours please call the anesthesia department.  QUESTIONS?:  Please feel free to call your physician or the hospital operator if you have any questions, and they will be happy to assist you.

## 2012-12-09 ENCOUNTER — Ambulatory Visit (HOSPITAL_COMMUNITY)
Admission: RE | Admit: 2012-12-09 | Discharge: 2012-12-09 | Disposition: A | Discharge status: Home / Self Care | Payer: Medicare Other | Source: Ambulatory Visit | Attending: General Surgery | Admitting: General Surgery

## 2012-12-09 ENCOUNTER — Encounter (HOSPITAL_COMMUNITY): Payer: Self-pay

## 2012-12-09 ENCOUNTER — Encounter (HOSPITAL_COMMUNITY): Payer: Self-pay | Admitting: Anesthesiology

## 2012-12-09 ENCOUNTER — Encounter (HOSPITAL_COMMUNITY)
Admission: RE | Disposition: A | Discharge status: Home / Self Care | Payer: Self-pay | Source: Ambulatory Visit | Attending: General Surgery

## 2012-12-09 ENCOUNTER — Ambulatory Visit (HOSPITAL_COMMUNITY): Payer: Medicare Other | Admitting: Anesthesiology

## 2012-12-09 DIAGNOSIS — E119 Type 2 diabetes mellitus without complications: Secondary | ICD-10-CM | POA: Insufficient documentation

## 2012-12-09 DIAGNOSIS — K4090 Unilateral inguinal hernia, without obstruction or gangrene, not specified as recurrent: Secondary | ICD-10-CM | POA: Insufficient documentation

## 2012-12-09 DIAGNOSIS — I10 Essential (primary) hypertension: Secondary | ICD-10-CM | POA: Insufficient documentation

## 2012-12-09 DIAGNOSIS — Z01812 Encounter for preprocedural laboratory examination: Secondary | ICD-10-CM | POA: Insufficient documentation

## 2012-12-09 HISTORY — PX: INGUINAL HERNIA REPAIR: SHX194

## 2012-12-09 HISTORY — PX: INSERTION OF MESH: SHX5868

## 2012-12-09 SURGERY — REPAIR, HERNIA, INGUINAL, ADULT
Anesthesia: Spinal | Site: Groin | Laterality: Left | Wound class: Clean

## 2012-12-09 MED ORDER — GLYCOPYRROLATE 0.2 MG/ML IJ SOLN
INTRAMUSCULAR | Status: AC
  Filled 2012-12-09: qty 2

## 2012-12-09 MED ORDER — PROPOFOL INFUSION 10 MG/ML OPTIME
INTRAVENOUS | Status: DC | PRN
  Administered 2012-12-09: 25 ug/kg/min via INTRAVENOUS
  Administered 2012-12-09: 20 ug/kg/min via INTRAVENOUS

## 2012-12-09 MED ORDER — EPHEDRINE SULFATE 50 MG/ML IJ SOLN
INTRAMUSCULAR | Status: DC | PRN
  Administered 2012-12-09: 5 mg via INTRAVENOUS
  Administered 2012-12-09: 10 mg via INTRAVENOUS

## 2012-12-09 MED ORDER — FENTANYL CITRATE 0.05 MG/ML IJ SOLN
25.0000 ug | INTRAMUSCULAR | Status: DC | PRN
  Administered 2012-12-09: 50 ug via INTRAVENOUS

## 2012-12-09 MED ORDER — BUPIVACAINE IN DEXTROSE 0.75-8.25 % IT SOLN
INTRATHECAL | Status: AC
  Filled 2012-12-09: qty 2

## 2012-12-09 MED ORDER — ONDANSETRON HCL 4 MG/2ML IJ SOLN: 4.0000 mg | Freq: Once | INTRAMUSCULAR | Status: DC | PRN

## 2012-12-09 MED ORDER — VANCOMYCIN HCL IN DEXTROSE 1-5 GM/200ML-% IV SOLN
1000.0000 mg | Freq: Once | INTRAVENOUS | Status: AC
  Administered 2012-12-09: 1000 mg via INTRAVENOUS

## 2012-12-09 MED ORDER — LACTATED RINGERS IV SOLN
INTRAVENOUS | Status: DC
  Administered 2012-12-09: 09:00:00 via INTRAVENOUS

## 2012-12-09 MED ORDER — KETOROLAC TROMETHAMINE 30 MG/ML IJ SOLN
INTRAMUSCULAR | Status: AC
  Filled 2012-12-09: qty 1

## 2012-12-09 MED ORDER — EPHEDRINE SULFATE 50 MG/ML IJ SOLN
INTRAMUSCULAR | Status: AC
  Filled 2012-12-09: qty 1

## 2012-12-09 MED ORDER — MIDAZOLAM HCL 2 MG/2ML IJ SOLN
INTRAMUSCULAR | Status: AC
  Filled 2012-12-09: qty 2

## 2012-12-09 MED ORDER — FENTANYL CITRATE 0.05 MG/ML IJ SOLN
INTRAMUSCULAR | Status: AC
  Filled 2012-12-09: qty 2

## 2012-12-09 MED ORDER — ONDANSETRON HCL 4 MG/2ML IJ SOLN
4.0000 mg | Freq: Once | INTRAMUSCULAR | Status: AC
  Administered 2012-12-09: 4 mg via INTRAVENOUS

## 2012-12-09 MED ORDER — KETOROLAC TROMETHAMINE 30 MG/ML IJ SOLN
30.0000 mg | Freq: Once | INTRAMUSCULAR | Status: AC
  Administered 2012-12-09: 30 mg via INTRAVENOUS

## 2012-12-09 MED ORDER — ONDANSETRON HCL 4 MG/2ML IJ SOLN
INTRAMUSCULAR | Status: AC
  Filled 2012-12-09: qty 2

## 2012-12-09 MED ORDER — FENTANYL CITRATE 0.05 MG/ML IJ SOLN
INTRAMUSCULAR | Status: AC
  Filled 2012-12-09: qty 5

## 2012-12-09 MED ORDER — HYDROCODONE-ACETAMINOPHEN 5-325 MG PO TABS: 1.0000 | ORAL_TABLET | Freq: Four times a day (QID) | ORAL | Status: DC | PRN

## 2012-12-09 MED ORDER — SODIUM CHLORIDE 0.9 % IR SOLN
Status: DC | PRN
  Administered 2012-12-09: 1000 mL

## 2012-12-09 MED ORDER — VANCOMYCIN HCL IN DEXTROSE 1-5 GM/200ML-% IV SOLN
INTRAVENOUS | Status: AC
  Filled 2012-12-09: qty 200

## 2012-12-09 MED ORDER — PROPOFOL 10 MG/ML IV EMUL
INTRAVENOUS | Status: AC
  Filled 2012-12-09: qty 20

## 2012-12-09 MED ORDER — BUPIVACAINE HCL (PF) 0.5 % IJ SOLN
INTRAMUSCULAR | Status: AC
  Filled 2012-12-09: qty 30

## 2012-12-09 MED ORDER — MIDAZOLAM HCL 2 MG/2ML IJ SOLN
1.0000 mg | INTRAMUSCULAR | Status: DC | PRN
  Administered 2012-12-09 (×2): 2 mg via INTRAVENOUS

## 2012-12-09 MED ORDER — CHLORHEXIDINE GLUCONATE 4 % EX LIQD: 1.0000 "application " | Freq: Once | CUTANEOUS | Status: DC

## 2012-12-09 MED ORDER — NEOSTIGMINE METHYLSULFATE 1 MG/ML IJ SOLN
INTRAMUSCULAR | Status: AC
  Filled 2012-12-09: qty 1

## 2012-12-09 MED ORDER — LIDOCAINE HCL (PF) 1 % IJ SOLN
INTRAMUSCULAR | Status: AC
  Filled 2012-12-09: qty 5

## 2012-12-09 MED ORDER — FENTANYL CITRATE 0.05 MG/ML IJ SOLN
INTRAMUSCULAR | Status: DC | PRN
  Administered 2012-12-09 (×2): 25 ug via INTRAVENOUS

## 2012-12-09 MED ORDER — FENTANYL CITRATE 0.05 MG/ML IJ SOLN
25.0000 ug | INTRAMUSCULAR | Status: DC | PRN
  Administered 2012-12-09: 25 ug via INTRAVENOUS

## 2012-12-09 MED ORDER — BUPIVACAINE LIPOSOME 1.3 % IJ SUSP
20.0000 mL | Freq: Once | INTRAMUSCULAR | Status: AC
  Administered 2012-12-09: 15 mL
  Filled 2012-12-09: qty 20

## 2012-12-09 SURGICAL SUPPLY — 42 items
ADH SKN CLS APL DERMABOND .7 (GAUZE/BANDAGES/DRESSINGS) ×1
BAG HAMPER (MISCELLANEOUS) ×2 IMPLANT
CLOTH BEACON ORANGE TIMEOUT ST (SAFETY) ×2 IMPLANT
COVER LIGHT HANDLE STERIS (MISCELLANEOUS) ×4 IMPLANT
DECANTER SPIKE VIAL GLASS SM (MISCELLANEOUS) ×2 IMPLANT
DERMABOND ADVANCED (GAUZE/BANDAGES/DRESSINGS) ×1
DERMABOND ADVANCED .7 DNX12 (GAUZE/BANDAGES/DRESSINGS) ×1 IMPLANT
DRAIN PENROSE 18X.75 LTX STRL (MISCELLANEOUS) ×2 IMPLANT
DRAPE UTILITY W/TAPE 26X15 (DRAPES) ×2 IMPLANT
ELECT REM PT RETURN 9FT ADLT (ELECTROSURGICAL) ×2
ELECTRODE REM PT RTRN 9FT ADLT (ELECTROSURGICAL) ×1 IMPLANT
FORMALIN 10 PREFIL 120ML (MISCELLANEOUS) IMPLANT
GLOVE BIO SURGEON STRL SZ7.5 (GLOVE) ×2 IMPLANT
GLOVE ECLIPSE 6.5 STRL STRAW (GLOVE) ×4 IMPLANT
GLOVE EXAM NITRILE MD LF STRL (GLOVE) ×2 IMPLANT
GLOVE INDICATOR 7.0 STRL GRN (GLOVE) ×4 IMPLANT
GOWN STRL REIN XL XLG (GOWN DISPOSABLE) ×6 IMPLANT
INST SET MINOR GENERAL (KITS) ×2 IMPLANT
KIT ROOM TURNOVER APOR (KITS) ×2 IMPLANT
MANIFOLD NEPTUNE II (INSTRUMENTS) ×2 IMPLANT
MESH HERNIA 1.6X1.9 PLUG LRG (Mesh General) ×2 IMPLANT
MESH HERNIA PLUG LRG (Mesh General) ×2 IMPLANT
NEEDLE HYPO 18GX1.5 BLUNT FILL (NEEDLE) ×2 IMPLANT
NS IRRIG 1000ML POUR BTL (IV SOLUTION) ×2 IMPLANT
PACK MINOR (CUSTOM PROCEDURE TRAY) ×2 IMPLANT
PAD ARMBOARD 7.5X6 YLW CONV (MISCELLANEOUS) ×2 IMPLANT
SET BASIN LINEN APH (SET/KITS/TRAYS/PACK) ×2 IMPLANT
SOL PREP PROV IODINE SCRUB 4OZ (MISCELLANEOUS) ×2 IMPLANT
SUT NOVA NAB GS-22 2 2-0 T-19 (SUTURE) ×4 IMPLANT
SUT NOVAFIL NAB HGS22 2-0 30IN (SUTURE) IMPLANT
SUT SILK 3 0 (SUTURE) ×2
SUT SILK 3-0 18XBRD TIE 12 (SUTURE) ×1 IMPLANT
SUT VIC AB 2-0 CT1 27 (SUTURE) ×2
SUT VIC AB 2-0 CT1 TAPERPNT 27 (SUTURE) ×1 IMPLANT
SUT VIC AB 3-0 SH 27 (SUTURE) ×2
SUT VIC AB 3-0 SH 27X BRD (SUTURE) ×1 IMPLANT
SUT VIC AB 4-0 PS2 27 (SUTURE) ×2 IMPLANT
SUT VICRYL AB 3 0 TIES (SUTURE) IMPLANT
SYR 20CC LL (SYRINGE) ×2 IMPLANT
SYR BULB IRRIGATION 50ML (SYRINGE) ×2 IMPLANT
SYR CONTROL 10ML LL (SYRINGE) ×2 IMPLANT
TOWEL OR 17X26 4PK STRL BLUE (TOWEL DISPOSABLE) ×2 IMPLANT

## 2012-12-09 NOTE — Anesthesia Preprocedure Evaluation (Signed)
Anesthesia Evaluation  Patient identified by MRN, date of birth, ID band Patient awake    Reviewed: Allergy & Precautions, H&P , NPO status , Patient's Chart, lab work & pertinent test results  Airway Mallampati: II TM Distance: >3 FB     Dental  (+) Teeth Intact and Implants   Pulmonary former smoker,  breath sounds clear to auscultation        Cardiovascular hypertension, + CAD, + Past MI and + Peripheral Vascular Disease Rhythm:Regular Rate:Normal     Neuro/Psych  Neuromuscular disease    GI/Hepatic GERD-  ,  Endo/Other  diabetes, Type 2  Renal/GU      Musculoskeletal   Abdominal   Peds  Hematology   Anesthesia Other Findings   Reproductive/Obstetrics                           Anesthesia Physical Anesthesia Plan  ASA: III  Anesthesia Plan: Spinal   Post-op Pain Management:    Induction:   Airway Management Planned: Nasal Cannula  Additional Equipment:   Intra-op Plan:   Post-operative Plan:   Informed Consent: I have reviewed the patients History and Physical, chart, labs and discussed the procedure including the risks, benefits and alternatives for the proposed anesthesia with the patient or authorized representative who has indicated his/her understanding and acceptance.     Plan Discussed with:   Anesthesia Plan Comments:         Anesthesia Quick Evaluation

## 2012-12-09 NOTE — Interval H&P Note (Signed)
History and Physical Interval Note:  12/09/2012 9:18 AM  William Walker  has presented today for surgery, with the diagnosis of left inguinal hernia  The various methods of treatment have been discussed with the patient and family. After consideration of risks, benefits and other options for treatment, the patient has consented to  Procedure(s): HERNIA REPAIR INGUINAL ADULT (Left) as a surgical intervention .  The patient's history has been reviewed, patient examined, no change in status, stable for surgery.  I have reviewed the patient's chart and labs.  Questions were answered to the patient's satisfaction.     Franky Macho A

## 2012-12-09 NOTE — Op Note (Signed)
Patient:  William Walker  DOB:  05/17/1927  MRN:  [id number]   Preop Diagnosis:  Left inguinal hernia  Postop Diagnosis:  Same  Procedure:  Left inguinal herniorrhaphy  Surgeon:  Franky Macho, M.D.  Anes:  Spinal  Indications:  Patient is an 79 year old white male who presents with a symptomatic left inguinal hernia. It is now difficult to reduce with bowel contents extending into the scrotum. The risks and benefits of the procedure including bleeding, infection, and recurrence of the hernia were fully explained to the patient, who gave informed consent.  Procedure note:  The patient is placed the supine position. After spinal anesthesia was administered, the left groin region was prepped and draped using usual sterile technique with Betadine. Surgical site confirmation was performed.  A transverse incision was made in the left groin region down to the external oblique aponeuroses. The aponeuroses was incised to the external ring. The patient had a chronic hydrocele with an indirect hernia sac that extended to the testicle. This was excised without difficulty. A direct hernia was also found in the hernia sac was freed away from the spermatic cord up to the peritoneal reflection and inverted. 2 large size PerFix Bard plug was then placed into this region. An onlay mesh patch was then placed along the floor of inguinal canal and secured superiorly to the conjoined tendon and inferiorly to the shelving edge of Poupart's ligament using 2-0 Novafil interrupted sutures. The internal ring was recreated using a 2-0 Novafil interrupted suture. The left testicle was secured back into the scrotum without difficulty. The external oblique aponeuroses was reapproximated using a 2-0 Vicryl running suture. Subcutaneous layer was reapproximated using 3-0 Vicryl interrupted suture. The skin was closed using a 4-0 Vicryl subcuticular suture. Exparel was then instilled into the surrounding wound. Dermabond was then  applied.  All tape and needle counts were correct at the end of the procedure. Patient was transferred to PACU in stable condition.  Complications:  None  EBL:  Minimal  Specimen:  None

## 2012-12-09 NOTE — Anesthesia Postprocedure Evaluation (Signed)
  Anesthesia Post-op Note  Patient: William Walker  Procedure(s) Performed: Procedure(s): HERNIA REPAIR INGUINAL with mesh (Left)  Patient Location: PACU  Anesthesia Type:Spinal  Level of Consciousness: awake, alert  and oriented  Airway and Oxygen Therapy: Patient Spontanous Breathing  Post-op Pain: none  Post-op Assessment: Post-op Vital signs reviewed, Patient's Cardiovascular Status Stable, Respiratory Function Stable, Patent Airway and No signs of Nausea or vomiting  Post-op Vital Signs: Reviewed and stable  Complications: No apparent anesthesia complications

## 2012-12-09 NOTE — Transfer of Care (Signed)
Immediate Anesthesia Transfer of Care Note  Patient: William Walker  Procedure(s) Performed: Procedure(s): HERNIA REPAIR INGUINAL with mesh (Left)  Patient Location: PACU  Anesthesia Type:Spinal  Level of Consciousness: awake, alert  and oriented  Airway & Oxygen Therapy: Patient Spontanous Breathing  Post-op Assessment: Report given to PACU RN  Post vital signs: Reviewed and stable  Complications: No apparent anesthesia complications

## 2012-12-09 NOTE — Anesthesia Procedure Notes (Signed)
Spinal  Patient location during procedure: OR Start time: 12/09/2012 10:13 AM Preanesthetic Checklist Completed: patient identified, site marked, surgical consent, pre-op evaluation, timeout performed, IV checked, risks and benefits discussed and monitors and equipment checked Spinal Block Patient position: left lateral decubitus Prep: Betadine Patient monitoring: heart rate, cardiac monitor, continuous pulse ox and blood pressure Approach: left paramedian Location: L3-4 Injection technique: single-shot Needle Needle type: Spinocan  Needle gauge: 22 G Needle length: 9 cm Assessment Sensory level: T8 Additional Notes  ATTEMPTS:1 TRAY ZO:[id number] TRAY EXPIRATION DATE:05/2013

## 2012-12-11 ENCOUNTER — Encounter (HOSPITAL_COMMUNITY): Payer: Self-pay | Admitting: General Surgery

## 2013-06-11 ENCOUNTER — Ambulatory Visit: Payer: Medicare Other | Admitting: Cardiovascular Disease

## 2013-06-14 ENCOUNTER — Ambulatory Visit: Payer: Medicare Other | Admitting: Cardiology

## 2013-06-17 DIAGNOSIS — I6529 Occlusion and stenosis of unspecified carotid artery: Secondary | ICD-10-CM | POA: Insufficient documentation

## 2013-06-17 DIAGNOSIS — K4090 Unilateral inguinal hernia, without obstruction or gangrene, not specified as recurrent: Secondary | ICD-10-CM | POA: Insufficient documentation

## 2013-06-17 DIAGNOSIS — E1340 Other specified diabetes mellitus with diabetic neuropathy, unspecified: Secondary | ICD-10-CM

## 2013-06-17 DIAGNOSIS — E1169 Type 2 diabetes mellitus with other specified complication: Secondary | ICD-10-CM | POA: Insufficient documentation

## 2013-06-18 ENCOUNTER — Ambulatory Visit (INDEPENDENT_AMBULATORY_CARE_PROVIDER_SITE_OTHER): Payer: Medicare Other | Admitting: Cardiology

## 2013-06-18 ENCOUNTER — Encounter: Payer: Self-pay | Admitting: Cardiology

## 2013-06-18 VITALS — BP 154/85 | HR 90 | Ht 71.0 in | Wt 210.5 lb

## 2013-06-18 DIAGNOSIS — I779 Disorder of arteries and arterioles, unspecified: Secondary | ICD-10-CM

## 2013-06-18 DIAGNOSIS — Z8679 Personal history of other diseases of the circulatory system: Secondary | ICD-10-CM

## 2013-06-18 DIAGNOSIS — I739 Peripheral vascular disease, unspecified: Principal | ICD-10-CM

## 2013-06-18 DIAGNOSIS — E785 Hyperlipidemia, unspecified: Secondary | ICD-10-CM

## 2013-06-18 DIAGNOSIS — I6529 Occlusion and stenosis of unspecified carotid artery: Secondary | ICD-10-CM

## 2013-06-18 DIAGNOSIS — I2510 Atherosclerotic heart disease of native coronary artery without angina pectoris: Secondary | ICD-10-CM

## 2013-06-18 NOTE — Patient Instructions (Addendum)
Your physician wants you to follow-up in: [place] will receive a reminder letter in the mail two months in advance. If you don't receive a letter, please call our office to schedule the follow-up appointment.  Your physician has requested that you have a carotid duplex. This test is an ultrasound of the carotid arteries in your neck. It looks at blood flow through these arteries that supply the brain with blood. Allow one hour for this exam. There are no restrictions or special instructions.  WE WILL CALL YOU WITH YOUR TEST RESULTS/INSTRUCTIONS/NEXT STEPS ONCE RECEIVED BY THE PROVIDER

## 2013-06-18 NOTE — Progress Notes (Signed)
Clinical Summary William Walker is an 80 y.o.male referred to the office for followup by Dr. Luan Pulling. He is a former patient of Dr. Verl Blalock, last seen in October 2013. He is here with his wife. Reports no angina symptoms, no regular nitroglycerin use. Has stable NYHA class II dyspnea. He still tries to play golf during the week when the weather will allow.  Carotid Dopplers from October 2013 showed 60-79% RICA stenosis and chronically occluded LICA as well as left vertebral. He has not had followup studies since that time.  Lipids are followed by Dr. Luan Pulling, he reports tolerating Pravachol.   Allergies  Allergen Reactions  . Penicillins Other (See Comments)    Unknown    Current Outpatient Prescriptions  Medication Sig Dispense Refill  . aspirin EC 325 MG tablet Take 325 mg by mouth daily.        Marland Kitchen HYDROcodone-acetaminophen (NORCO) 5-325 MG per tablet Take 1 tablet by mouth every 6 (six) hours as needed for pain.  40 tablet  0  . lansoprazole (PREVACID) 15 MG capsule Take 15 mg by mouth daily.      Marland Kitchen lisinopril (PRINIVIL,ZESTRIL) 20 MG tablet Take 20 mg by mouth daily.        . pravastatin (PRAVACHOL) 40 MG tablet Take 40 mg by mouth daily.        . traMADol (ULTRAM) 50 MG tablet Take 50 mg by mouth every 6 (six) hours as needed for pain.        No current facility-administered medications for this visit.    Past Medical History  Diagnosis Date  . Coronary atherosclerosis of native coronary artery     Reportedly 2 stents - presumably LAD (records not available)  . CVD (cerebrovascular disease)   . Essential hypertension, benign   . Hyperlipidemia   . MI (myocardial infarction)     October 1999  . Type 2 diabetes mellitus with diabetic neuropathy   . GERD (gastroesophageal reflux disease)     Past Surgical History  Procedure Laterality Date  . Arch cerebral ateriogram  12/11/2005    Rosetta Posner MD  . Cataract extraction, bilateral    . Inguinal hernia repair Left 12/09/2012    Procedure: HERNIA REPAIR INGUINAL with mesh;  Surgeon: [name], MD;  Location: [facility];  Service: General;  Laterality: Left;  . Insertion of mesh Left 12/09/2012    Procedure: INSERTION OF MESH;  Surgeon: [name], MD;  Location: [facility];  Service: General;  Laterality: Left;    Family History  Problem Relation Age of Onset  . Coronary artery disease Father     Social History Mr. Rod reports that he quit smoking about 64 years ago. His smoking use included Pipe. He does not have any smokeless tobacco history on file. [name] reports that he does not drink alcohol.  Review of Systems No palpitations, dizziness, syncope. Hard of hearing. No leg swelling. Stable appetite.  Physical Examination Filed Vitals:   06/18/13 1352  BP: 154/85  Pulse: 90   Filed Weights   06/18/13 1352  Weight: 210 lb 8 oz (95.482 kg)   Patient appears comfortable at rest. HEENT: Conjunctiva and lids normal, oropharynx clear. Neck: Supple, no elevated JVP or carotid bruits, no thyromegaly. Lungs: Clear to auscultation, nonlabored breathing at rest. Cardiac: Regular rate and rhythm, no S3, 2/6 systolic murmur. Abdomen: Soft, nontender, bowel sounds present. Extremities: No pitting edema, distal pulses 1-2+. Skin: Warm and dry. Musculoskeletal: No kyphosis.  Neuropsychiatric: Alert and oriented x3, affect grossly appropriate.   Problem List and Plan   CAD, NATIVE VESSEL Symptomatically stable over time on medical therapy, history of remote infarct in October 1999 with previous intervention. He has been managed conservatively, continue observation for now.  Bilateral carotid artery disease Known occluded LICA and 93-81% RICA stenosis by Dopplers in October 2013. Repeat study to be obtained. He has seen Dr. Donnetta Hutching in the past. For now continues on aspirin and statin therapy.  HYPERLIPIDEMIA-MIXED Lipids are followed by Dr. Luan Pulling, he continues on Pravachol. Would aim for LDL around  70.    Satira Sark, M.D., F.A.C.C.

## 2013-06-18 NOTE — Assessment & Plan Note (Signed)
Known occluded LICA and 88-87% RICA stenosis by Dopplers in October 2013. Repeat study to be obtained. He has seen Dr. Donnetta Hutching in the past. For now continues on aspirin and statin therapy.

## 2013-06-18 NOTE — Assessment & Plan Note (Signed)
Lipids are followed by Dr. Luan Pulling, he continues on Pravachol. Would aim for LDL around 70.

## 2013-06-18 NOTE — Addendum Note (Signed)
Addended by: Shara Blazing A on: 06/18/2013 03:14 PM   Modules accepted: Orders

## 2013-06-18 NOTE — Assessment & Plan Note (Signed)
Symptomatically stable over time on medical therapy, history of remote infarct in October 1999 with previous intervention. He has been managed conservatively, continue observation for now.

## 2013-06-24 ENCOUNTER — Encounter (INDEPENDENT_AMBULATORY_CARE_PROVIDER_SITE_OTHER): Payer: Medicare Other

## 2013-06-24 DIAGNOSIS — I739 Peripheral vascular disease, unspecified: Principal | ICD-10-CM

## 2013-06-24 DIAGNOSIS — I779 Disorder of arteries and arterioles, unspecified: Secondary | ICD-10-CM

## 2013-06-24 DIAGNOSIS — I6529 Occlusion and stenosis of unspecified carotid artery: Secondary | ICD-10-CM

## 2013-06-24 DIAGNOSIS — Z8679 Personal history of other diseases of the circulatory system: Secondary | ICD-10-CM

## 2013-06-24 DIAGNOSIS — I2510 Atherosclerotic heart disease of native coronary artery without angina pectoris: Secondary | ICD-10-CM

## 2013-06-25 ENCOUNTER — Other Ambulatory Visit: Payer: Self-pay | Admitting: *Deleted

## 2013-06-25 DIAGNOSIS — I25810 Atherosclerosis of coronary artery bypass graft(s) without angina pectoris: Secondary | ICD-10-CM

## 2014-06-07 DIAGNOSIS — Z961 Presence of intraocular lens: Secondary | ICD-10-CM | POA: Diagnosis not present

## 2014-06-09 DIAGNOSIS — E785 Hyperlipidemia, unspecified: Secondary | ICD-10-CM | POA: Diagnosis not present

## 2014-06-09 DIAGNOSIS — I119 Hypertensive heart disease without heart failure: Secondary | ICD-10-CM | POA: Diagnosis not present

## 2014-06-09 DIAGNOSIS — E1140 Type 2 diabetes mellitus with diabetic neuropathy, unspecified: Secondary | ICD-10-CM | POA: Diagnosis not present

## 2014-06-09 DIAGNOSIS — E1169 Type 2 diabetes mellitus with other specified complication: Secondary | ICD-10-CM | POA: Diagnosis not present

## 2014-07-19 ENCOUNTER — Encounter: Payer: Self-pay | Admitting: *Deleted

## 2014-08-22 DIAGNOSIS — D225 Melanocytic nevi of trunk: Secondary | ICD-10-CM | POA: Diagnosis not present

## 2014-08-22 DIAGNOSIS — L570 Actinic keratosis: Secondary | ICD-10-CM | POA: Diagnosis not present

## 2014-08-22 DIAGNOSIS — X32XXXD Exposure to sunlight, subsequent encounter: Secondary | ICD-10-CM | POA: Diagnosis not present

## 2014-08-25 ENCOUNTER — Encounter: Payer: Self-pay | Admitting: Cardiology

## 2014-08-25 ENCOUNTER — Ambulatory Visit (INDEPENDENT_AMBULATORY_CARE_PROVIDER_SITE_OTHER): Payer: Medicare Other | Admitting: Cardiology

## 2014-08-25 VITALS — BP 110/62 | HR 82 | Ht 72.0 in | Wt 207.0 lb

## 2014-08-25 DIAGNOSIS — I2510 Atherosclerotic heart disease of native coronary artery without angina pectoris: Secondary | ICD-10-CM | POA: Diagnosis not present

## 2014-08-25 DIAGNOSIS — I6523 Occlusion and stenosis of bilateral carotid arteries: Secondary | ICD-10-CM

## 2014-08-25 DIAGNOSIS — E782 Mixed hyperlipidemia: Secondary | ICD-10-CM

## 2014-08-25 DIAGNOSIS — I10 Essential (primary) hypertension: Secondary | ICD-10-CM | POA: Diagnosis not present

## 2014-08-25 NOTE — Progress Notes (Signed)
Cardiology Office Note  Date: 08/25/2014   ID: William Walker, DOB 1927-03-10, MRN [id number]  PCP: Alonza Bogus, MD  Primary Cardiologist: [name], MD   Chief Complaint  Patient presents with  . Coronary Artery Disease  . Hypertension    History of Present Illness: William Walker is an 81 y.o. male last seen in January 2015. He presents today for a routine visit. Since last year he reports no major change in functional capacity, still plays golf when he can. He does not endorse any angina symptoms on medical therapy. He sees Dr. Luan Pulling about every 3 months, tells me that he will be having a physical soon with lab work. Follow-up tracing today shows sinus rhythm with prolonged PR interval.  Today we discussed his cardiac history and possibility of follow-up testing to reassess ischemic burden and also valvular status. At this point he told me that he was comfortable following symptoms, and will consider further testing if anything changed.   Past Medical History  Diagnosis Date  . Coronary atherosclerosis of native coronary artery     Reportedly 2 stents - presumably LAD (records not available)  . CVD (cerebrovascular disease)   . Essential hypertension, benign   . Hyperlipidemia   . MI (myocardial infarction)     October 1999  . Type 2 diabetes mellitus with diabetic neuropathy   . GERD (gastroesophageal reflux disease)     Past Surgical History  Procedure Laterality Date  . Arch cerebral ateriogram  12/11/2005    Rosetta Posner MD  . Cataract extraction, bilateral    . Inguinal hernia repair Left 12/09/2012    Procedure: HERNIA REPAIR INGUINAL with mesh;  Surgeon: [name], MD;  Location: [facility];  Service: General;  Laterality: Left;  . Insertion of mesh Left 12/09/2012    Procedure: INSERTION OF MESH;  Surgeon: [name], MD;  Location: [facility];  Service: General;  Laterality: Left;    Current Outpatient Prescriptions  Medication Sig Dispense Refill    . aspirin EC 325 MG tablet Take 325 mg by mouth daily.      Marland Kitchen HYDROcodone-acetaminophen (NORCO) 5-325 MG per tablet Take 1 tablet by mouth every 6 (six) hours as needed for pain. 40 tablet 0  . lansoprazole (PREVACID) 15 MG capsule Take 15 mg by mouth daily.    Marland Kitchen lisinopril (PRINIVIL,ZESTRIL) 20 MG tablet Take 20 mg by mouth daily.      . pravastatin (PRAVACHOL) 40 MG tablet Take 40 mg by mouth daily.      . traMADol (ULTRAM) 50 MG tablet Take 50 mg by mouth every 6 (six) hours as needed for pain.      No current facility-administered medications for this visit.    Allergies:  Penicillins   Social History: The patient  reports that he quit smoking about 65 years ago. His smoking use included Pipe. He does not have any smokeless tobacco history on file. He reports that he does not drink alcohol or use illicit drugs.   ROS:  Please see the history of present illness. Otherwise, complete review of systems is positive for "aches and pains." No palpitations or syncope.  All other systems are reviewed and negative.   Physical Exam: VS:  BP 110/62 mmHg  Pulse 82  Ht 6' (1.829 m)  Wt 207 lb (93.895 kg)  BMI 28.07 kg/m2  SpO2 98%, BMI Body mass index is 28.07 kg/(m^2).  Wt Readings from Last 3 Encounters:  08/25/14  207 lb (93.895 kg)  06/18/13 210 lb 8 oz (95.482 kg)  12/09/12 208 lb (94.348 kg)     Patient appears comfortable at rest. HEENT: Conjunctiva and lids normal, oropharynx clear. Neck: Supple, no elevated JVP or carotid bruits, no thyromegaly. Lungs: Clear to auscultation, nonlabored breathing at rest. Cardiac: Regular rate and rhythm, no S3, 2-3/6 higher pitched systolic murmur at base. Abdomen: Soft, nontender, bowel sounds present. Extremities: No pitting edema, distal pulses 1-2+. Skin: Warm and dry. Musculoskeletal: No kyphosis. Neuropsychiatric: Alert and oriented x3, affect grossly appropriate.   ECG: ECG is ordered today and reviewed showing sinus rhythm with  prolonged PR interval.   Other Studies Reviewed Today:  Carotid Dopplers from 06/25/2013 showed chronic occlusion of the LICA with 12-24% RICA stenosis.  ASSESSMENT AND PLAN:  1. Symptomatically stable CAD status post previous remote interventions to the LAD based on limited information. ECG is stable. We discussed potential for follow-up ischemic evaluation, however he indicated being comfortable with observation at this time, would consider follow-up testing if notices a significant change.  2. Bilateral carotid artery disease, occluded LICA, and moderate disease of the RICA. Follow-up carotid Dopplers will be obtained.  3. Essential hypertension, blood pressure is normal today.  4. Hyperlipidemia, on Pravachol, followed by Dr. Luan Pulling.  Current medicines were reviewed at length with the patient today.   Orders Placed This Encounter  Procedures  . EKG 12-Lead    Disposition: FU with me in 1 year.   Signed, Satira Sark, MD, [facility] 08/25/2014 10:32 AM    [facility] at [place]. [address], [place], [place] [address] Phone: [phone]; Fax: [phone]

## 2014-08-25 NOTE — Patient Instructions (Addendum)
Your physician wants you to follow-up in: 1 year You will receive a reminder letter in the mail two months in advance. If you don't receive a letter, please call our office to schedule the follow-up appointment.    Your physician recommends that you continue on your current medications as directed. Please refer to the Current Medication list given to you today.  Please schedule Carotid Ultrasound at the [facility] office  Thank you for choosing [facility] !

## 2014-09-08 DIAGNOSIS — Z1211 Encounter for screening for malignant neoplasm of colon: Secondary | ICD-10-CM | POA: Diagnosis not present

## 2014-09-08 DIAGNOSIS — Z0000 Encounter for general adult medical examination without abnormal findings: Secondary | ICD-10-CM | POA: Diagnosis not present

## 2014-09-12 DIAGNOSIS — E785 Hyperlipidemia, unspecified: Secondary | ICD-10-CM | POA: Diagnosis not present

## 2014-09-12 DIAGNOSIS — I2510 Atherosclerotic heart disease of native coronary artery without angina pectoris: Secondary | ICD-10-CM | POA: Diagnosis not present

## 2014-09-12 DIAGNOSIS — E1140 Type 2 diabetes mellitus with diabetic neuropathy, unspecified: Secondary | ICD-10-CM | POA: Diagnosis not present

## 2014-09-12 DIAGNOSIS — Z0000 Encounter for general adult medical examination without abnormal findings: Secondary | ICD-10-CM | POA: Diagnosis not present

## 2014-09-12 DIAGNOSIS — I119 Hypertensive heart disease without heart failure: Secondary | ICD-10-CM | POA: Diagnosis not present

## 2014-09-14 ENCOUNTER — Encounter (INDEPENDENT_AMBULATORY_CARE_PROVIDER_SITE_OTHER): Payer: Medicare Other

## 2014-09-14 DIAGNOSIS — I6523 Occlusion and stenosis of bilateral carotid arteries: Secondary | ICD-10-CM

## 2014-12-08 DIAGNOSIS — I2510 Atherosclerotic heart disease of native coronary artery without angina pectoris: Secondary | ICD-10-CM | POA: Diagnosis not present

## 2014-12-08 DIAGNOSIS — E1140 Type 2 diabetes mellitus with diabetic neuropathy, unspecified: Secondary | ICD-10-CM | POA: Diagnosis not present

## 2014-12-08 DIAGNOSIS — E1169 Type 2 diabetes mellitus with other specified complication: Secondary | ICD-10-CM | POA: Diagnosis not present

## 2014-12-08 DIAGNOSIS — I10 Essential (primary) hypertension: Secondary | ICD-10-CM | POA: Diagnosis not present

## 2014-12-16 DIAGNOSIS — I2510 Atherosclerotic heart disease of native coronary artery without angina pectoris: Secondary | ICD-10-CM | POA: Diagnosis not present

## 2014-12-16 DIAGNOSIS — E1140 Type 2 diabetes mellitus with diabetic neuropathy, unspecified: Secondary | ICD-10-CM | POA: Diagnosis not present

## 2014-12-16 DIAGNOSIS — I10 Essential (primary) hypertension: Secondary | ICD-10-CM | POA: Diagnosis not present

## 2014-12-16 DIAGNOSIS — E1169 Type 2 diabetes mellitus with other specified complication: Secondary | ICD-10-CM | POA: Diagnosis not present

## 2015-02-16 DIAGNOSIS — E1169 Type 2 diabetes mellitus with other specified complication: Secondary | ICD-10-CM | POA: Diagnosis not present

## 2015-02-16 DIAGNOSIS — J301 Allergic rhinitis due to pollen: Secondary | ICD-10-CM | POA: Diagnosis not present

## 2015-02-16 DIAGNOSIS — I119 Hypertensive heart disease without heart failure: Secondary | ICD-10-CM | POA: Diagnosis not present

## 2015-03-09 DIAGNOSIS — I2510 Atherosclerotic heart disease of native coronary artery without angina pectoris: Secondary | ICD-10-CM | POA: Diagnosis not present

## 2015-03-09 DIAGNOSIS — E1140 Type 2 diabetes mellitus with diabetic neuropathy, unspecified: Secondary | ICD-10-CM | POA: Diagnosis not present

## 2015-03-09 DIAGNOSIS — Z23 Encounter for immunization: Secondary | ICD-10-CM | POA: Diagnosis not present

## 2015-03-09 DIAGNOSIS — I6521 Occlusion and stenosis of right carotid artery: Secondary | ICD-10-CM | POA: Diagnosis not present

## 2015-03-09 DIAGNOSIS — E1151 Type 2 diabetes mellitus with diabetic peripheral angiopathy without gangrene: Secondary | ICD-10-CM | POA: Diagnosis not present

## 2015-03-23 ENCOUNTER — Telehealth: Payer: Self-pay | Admitting: Cardiology

## 2015-03-23 NOTE — Telephone Encounter (Signed)
6 month fu Cartoid doppers-per Vas Lab Report -last one done 09/14/2014  Unable to reach patient by phone. Mailed patient a reminder letter.

## 2015-03-24 DIAGNOSIS — E1151 Type 2 diabetes mellitus with diabetic peripheral angiopathy without gangrene: Secondary | ICD-10-CM | POA: Diagnosis not present

## 2015-03-24 DIAGNOSIS — E1140 Type 2 diabetes mellitus with diabetic neuropathy, unspecified: Secondary | ICD-10-CM | POA: Diagnosis not present

## 2015-03-24 DIAGNOSIS — I2510 Atherosclerotic heart disease of native coronary artery without angina pectoris: Secondary | ICD-10-CM | POA: Diagnosis not present

## 2015-03-24 DIAGNOSIS — I6521 Occlusion and stenosis of right carotid artery: Secondary | ICD-10-CM | POA: Diagnosis not present

## 2015-05-03 DIAGNOSIS — H578 Other specified disorders of eye and adnexa: Secondary | ICD-10-CM | POA: Diagnosis not present

## 2015-06-08 DIAGNOSIS — I10 Essential (primary) hypertension: Secondary | ICD-10-CM | POA: Diagnosis not present

## 2015-06-08 DIAGNOSIS — I2510 Atherosclerotic heart disease of native coronary artery without angina pectoris: Secondary | ICD-10-CM | POA: Diagnosis not present

## 2015-06-08 DIAGNOSIS — I119 Hypertensive heart disease without heart failure: Secondary | ICD-10-CM | POA: Diagnosis not present

## 2015-06-08 DIAGNOSIS — E1151 Type 2 diabetes mellitus with diabetic peripheral angiopathy without gangrene: Secondary | ICD-10-CM | POA: Diagnosis not present

## 2015-07-04 DIAGNOSIS — Z961 Presence of intraocular lens: Secondary | ICD-10-CM | POA: Diagnosis not present

## 2015-07-04 DIAGNOSIS — H35363 Drusen (degenerative) of macula, bilateral: Secondary | ICD-10-CM | POA: Diagnosis not present

## 2015-07-04 DIAGNOSIS — H524 Presbyopia: Secondary | ICD-10-CM | POA: Diagnosis not present

## 2015-07-04 DIAGNOSIS — H52203 Unspecified astigmatism, bilateral: Secondary | ICD-10-CM | POA: Diagnosis not present

## 2015-07-12 ENCOUNTER — Other Ambulatory Visit: Payer: Self-pay | Admitting: Cardiology

## 2015-07-12 DIAGNOSIS — I6523 Occlusion and stenosis of bilateral carotid arteries: Secondary | ICD-10-CM

## 2015-07-27 ENCOUNTER — Ambulatory Visit: Payer: Medicare Other

## 2015-07-27 DIAGNOSIS — I6523 Occlusion and stenosis of bilateral carotid arteries: Secondary | ICD-10-CM | POA: Diagnosis not present

## 2015-08-31 ENCOUNTER — Ambulatory Visit (INDEPENDENT_AMBULATORY_CARE_PROVIDER_SITE_OTHER): Payer: Medicare Other | Admitting: Cardiology

## 2015-08-31 ENCOUNTER — Encounter: Payer: Self-pay | Admitting: Cardiology

## 2015-08-31 VITALS — BP 128/70 | HR 76 | Ht 72.0 in | Wt 207.0 lb

## 2015-08-31 DIAGNOSIS — I2510 Atherosclerotic heart disease of native coronary artery without angina pectoris: Secondary | ICD-10-CM | POA: Diagnosis not present

## 2015-08-31 DIAGNOSIS — I6523 Occlusion and stenosis of bilateral carotid arteries: Secondary | ICD-10-CM

## 2015-08-31 DIAGNOSIS — E782 Mixed hyperlipidemia: Secondary | ICD-10-CM

## 2015-08-31 NOTE — Patient Instructions (Signed)
Your physician wants you to follow-up in: 1 year with Dr Mcdowell You will receive a reminder letter in the mail two months in advance. If you don't receive a letter, please call our office to schedule the follow-up appointment.    Your physician recommends that you continue on your current medications as directed. Please refer to the Current Medication list given to you today.     If you need a refill on your cardiac medications before your next appointment, please call your pharmacy.      Thank you for choosing [facility] !        

## 2015-08-31 NOTE — Progress Notes (Signed)
Cardiology Office Note  Date: 08/31/2015   ID: KENDERICK KETELSEN, DOB [birth year], MRN [id number]  PCP: Alonza Bogus, MD  Primary Cardiologist: [name], MD   Chief Complaint  Patient presents with  . Coronary Artery Disease    History of Present Illness: William Walker is an 82 y.o. male last seen in March 2016. He is here today for a routine visit. He does not report any angina symptoms, remains functional in ADLs, has gradually slowed down over the years. I reviewed his medications which are outlined below.  He remains comfortable with observation, has declined follow-up cardiac ischemic and structural testing at this point.  He had recent carotid Dopplers done with stable findings as outlined below. We discussed the results again today. Anticipate a follow-up study in one year.  Past Medical History  Diagnosis Date  . Coronary atherosclerosis of native coronary artery     Reportedly 2 stents - presumably LAD (records not available)  . CVD (cerebrovascular disease)   . Essential hypertension, benign   . Hyperlipidemia   . MI (myocardial infarction) [facility])     October 1999  . Type 2 diabetes mellitus with diabetic neuropathy ([place])   . GERD (gastroesophageal reflux disease)     Current Outpatient Prescriptions  Medication Sig Dispense Refill  . aspirin EC 325 MG tablet Take 325 mg by mouth daily.      . lansoprazole (PREVACID) 15 MG capsule Take 15 mg by mouth daily.    Marland Kitchen lisinopril (PRINIVIL,ZESTRIL) 20 MG tablet Take 20 mg by mouth daily.      . pravastatin (PRAVACHOL) 40 MG tablet Take 40 mg by mouth daily.      . traMADol (ULTRAM) 50 MG tablet Take 50 mg by mouth every 6 (six) hours as needed for pain.      No current facility-administered medications for this visit.   Allergies:  Penicillins   Social History: The patient  reports that he quit smoking about 66 years ago. His smoking use included Pipe. He does not have any smokeless tobacco history on file. He  reports that he does not drink alcohol or use illicit drugs.   ROS:  Please see the history of present illness. Otherwise, complete review of systems is positive for arthritic stiffness.  All other systems are reviewed and negative.   Physical Exam: VS:  BP 128/70 mmHg  Pulse 76  Ht 6' (1.829 m)  Wt 207 lb (93.895 kg)  BMI 28.07 kg/m2  SpO2 99%, BMI Body mass index is 28.07 kg/(m^2).  Wt Readings from Last 3 Encounters:  08/31/15 207 lb (93.895 kg)  08/25/14 207 lb (93.895 kg)  06/18/13 210 lb 8 oz (95.482 kg)    Patient appears comfortable at rest. HEENT: Conjunctiva and lids normal, oropharynx clear. Neck: Supple, no elevated JVP or carotid bruits, no thyromegaly. Lungs: Clear to auscultation, nonlabored breathing at rest. Cardiac: Regular rate and rhythm, no S3, 2-3/6 higher pitched systolic murmur at base. Abdomen: Soft, nontender, bowel sounds present. Extremities: No pitting edema, distal pulses 1-2+.  ECG: I personally reviewed the prior tracing from 08/25/2014 which showed sinus rhythm with prolonged PR interval.  Other Studies Reviewed Today:  Carotid Dopplers 07/27/2015: Chronically occluded LICA with [id number] RICA stenosis.  Assessment and Plan:  1. Symptomatically stable CAD on medical therapy. He prefers conservative management, we continue observation for now.  2. Carotid artery disease with occluded LICA and moderate disease of the RICA. Follow carotid Dopplers next year.  3.  Hyperlipidemia, continues on Pravachol. He will be having follow-up lipids with Dr. Luan Pulling in the next month.  Current medicines were reviewed with the patient today.  Disposition: FU with me in 1 year.   Signed, Satira Sark, MD, [facility] 08/31/2015 10:26 AM    [facility] at [facility] [address] [place], [place] [address] Phone: [phone]; Fax: [phone]

## 2015-09-14 DIAGNOSIS — Z0000 Encounter for general adult medical examination without abnormal findings: Secondary | ICD-10-CM | POA: Diagnosis not present

## 2015-09-14 DIAGNOSIS — Z1211 Encounter for screening for malignant neoplasm of colon: Secondary | ICD-10-CM | POA: Diagnosis not present

## 2015-09-20 DIAGNOSIS — E1151 Type 2 diabetes mellitus with diabetic peripheral angiopathy without gangrene: Secondary | ICD-10-CM | POA: Diagnosis not present

## 2015-09-20 DIAGNOSIS — E785 Hyperlipidemia, unspecified: Secondary | ICD-10-CM | POA: Diagnosis not present

## 2015-09-20 DIAGNOSIS — I119 Hypertensive heart disease without heart failure: Secondary | ICD-10-CM | POA: Diagnosis not present

## 2015-09-20 DIAGNOSIS — E1169 Type 2 diabetes mellitus with other specified complication: Secondary | ICD-10-CM | POA: Diagnosis not present

## 2015-09-20 DIAGNOSIS — E1140 Type 2 diabetes mellitus with diabetic neuropathy, unspecified: Secondary | ICD-10-CM | POA: Diagnosis not present

## 2015-12-21 DIAGNOSIS — I2510 Atherosclerotic heart disease of native coronary artery without angina pectoris: Secondary | ICD-10-CM | POA: Diagnosis not present

## 2015-12-21 DIAGNOSIS — E1151 Type 2 diabetes mellitus with diabetic peripheral angiopathy without gangrene: Secondary | ICD-10-CM | POA: Diagnosis not present

## 2015-12-21 DIAGNOSIS — I119 Hypertensive heart disease without heart failure: Secondary | ICD-10-CM | POA: Diagnosis not present

## 2015-12-21 DIAGNOSIS — E1140 Type 2 diabetes mellitus with diabetic neuropathy, unspecified: Secondary | ICD-10-CM | POA: Diagnosis not present

## 2016-07-11 DIAGNOSIS — E1151 Type 2 diabetes mellitus with diabetic peripheral angiopathy without gangrene: Secondary | ICD-10-CM | POA: Diagnosis not present

## 2016-07-11 DIAGNOSIS — I2510 Atherosclerotic heart disease of native coronary artery without angina pectoris: Secondary | ICD-10-CM | POA: Diagnosis not present

## 2016-07-11 DIAGNOSIS — I10 Essential (primary) hypertension: Secondary | ICD-10-CM | POA: Diagnosis not present

## 2016-07-11 DIAGNOSIS — I739 Peripheral vascular disease, unspecified: Secondary | ICD-10-CM | POA: Diagnosis not present

## 2016-09-09 DIAGNOSIS — E1151 Type 2 diabetes mellitus with diabetic peripheral angiopathy without gangrene: Secondary | ICD-10-CM | POA: Diagnosis not present

## 2016-09-09 DIAGNOSIS — I2510 Atherosclerotic heart disease of native coronary artery without angina pectoris: Secondary | ICD-10-CM | POA: Diagnosis not present

## 2016-09-09 DIAGNOSIS — I10 Essential (primary) hypertension: Secondary | ICD-10-CM | POA: Diagnosis not present

## 2016-09-10 ENCOUNTER — Other Ambulatory Visit (HOSPITAL_COMMUNITY): Payer: Self-pay | Admitting: Pulmonary Disease

## 2016-09-10 DIAGNOSIS — R609 Edema, unspecified: Secondary | ICD-10-CM

## 2016-09-16 ENCOUNTER — Ambulatory Visit (HOSPITAL_COMMUNITY)
Admission: RE | Admit: 2016-09-16 | Discharge: 2016-09-16 | Disposition: A | Discharge status: Home / Self Care | Payer: Medicare Other | Source: Ambulatory Visit | Attending: Pulmonary Disease | Admitting: Pulmonary Disease

## 2016-09-16 DIAGNOSIS — E785 Hyperlipidemia, unspecified: Secondary | ICD-10-CM | POA: Diagnosis not present

## 2016-09-16 DIAGNOSIS — I351 Nonrheumatic aortic (valve) insufficiency: Secondary | ICD-10-CM | POA: Diagnosis not present

## 2016-09-16 DIAGNOSIS — E119 Type 2 diabetes mellitus without complications: Secondary | ICD-10-CM | POA: Insufficient documentation

## 2016-09-16 DIAGNOSIS — I10 Essential (primary) hypertension: Secondary | ICD-10-CM | POA: Insufficient documentation

## 2016-09-16 DIAGNOSIS — R609 Edema, unspecified: Secondary | ICD-10-CM | POA: Insufficient documentation

## 2016-09-16 DIAGNOSIS — I2510 Atherosclerotic heart disease of native coronary artery without angina pectoris: Secondary | ICD-10-CM | POA: Insufficient documentation

## 2016-09-16 DIAGNOSIS — Z87891 Personal history of nicotine dependence: Secondary | ICD-10-CM | POA: Insufficient documentation

## 2016-09-16 NOTE — Progress Notes (Signed)
*  PRELIMINARY RESULTS* Echocardiogram 2D Echocardiogram has been performed.  Leavy Cella 09/16/2016, 2:41 PM

## 2016-10-08 NOTE — Progress Notes (Signed)
Cardiology Office Note  Date: 10/09/2016   ID: JYAIR KIRALY, DOB 02-20-1927, MRN [id number]  PCP: [name], MD  Primary Cardiologist: [name], MD   Chief Complaint  Patient presents with  . Coronary Artery Disease    History of Present Illness: William Walker is an 83 y.o. male last seen in March 2017.He presents today for a routine visit. Reports no chest pain symptoms. Mainly limited by chronic leg weakness and neuropathy. Still functions with his ADLs, states that he is compliant with medications.  Recent follow-up echocardiogram from April obtained by Dr. Luan Pulling demonstrated normal LVEF at 60-65% with grade 1 diastolic dysfunction and increased filling pressures, also moderate aortic stenosis with mild aortic regurgitation. He had been having some leg swelling and is now using Lasix as needed with good results. He is not on standing diuretic.  I reviewed his medications. Cardiac regimen includes aspirin, lisinopril, and Pravachol. Today's blood pressure was low normal. He states that sometimes he feels lightheaded when he stands up. He does check his blood pressure at home and I told him that he could reduce the dose of lisinopril to 10 mg daily if he sees that his systolic is more regularly under the 120s.  I personally reviewed his ECG today which shows sinus rhythm with prolonged PR interval.  Past Medical History:  Diagnosis Date  . Coronary atherosclerosis of native coronary artery    Reportedly 2 stents - presumably LAD (records not available)  . CVD (cerebrovascular disease)   . Essential hypertension, benign   . GERD (gastroesophageal reflux disease)   . Hyperlipidemia   . MI (myocardial infarction) [facility])    October 1999  . Type 2 diabetes mellitus with diabetic neuropathy [facility])     Past Surgical History:  Procedure Laterality Date  . Arch cerebral ateriogram  12/11/2005   Rosetta Posner MD  . CATARACT EXTRACTION, BILATERAL    . INGUINAL HERNIA  REPAIR Left 12/09/2012   Procedure: HERNIA REPAIR INGUINAL with mesh;  Surgeon: [name], MD;  Location: [facility];  Service: General;  Laterality: Left;  . INSERTION OF MESH Left 12/09/2012   Procedure: INSERTION OF MESH;  Surgeon: [name], MD;  Location: [facility];  Service: General;  Laterality: Left;    Current Outpatient Prescriptions  Medication Sig Dispense Refill  . aspirin EC 325 MG tablet Take 325 mg by mouth daily.      . lansoprazole (PREVACID) 15 MG capsule Take 15 mg by mouth daily.    Marland Kitchen lisinopril (PRINIVIL,ZESTRIL) 20 MG tablet Take 20 mg by mouth daily.      . pravastatin (PRAVACHOL) 40 MG tablet Take 40 mg by mouth daily.      . traMADol (ULTRAM) 50 MG tablet Take 50 mg by mouth every 6 (six) hours as needed for pain.      No current facility-administered medications for this visit.    Allergies:  Penicillins   Social History: The patient  reports that he quit smoking about 67 years ago. His smoking use included Pipe. He has never used smokeless tobacco. He reports that he does not drink alcohol or use drugs.   Family History: The patient's family history includes Coronary artery disease in his father.   ROS:  Please see the history of present illness. Otherwise, complete review of systems is positive for arthritic pains.  All other systems are reviewed and negative.   Physical Exam: VS:  BP 102/60   Pulse 86  Ht 6' (1.829 m)   Wt 212 lb (96.2 kg)   SpO2 97%   BMI 28.75 kg/m , BMI Body mass index is 28.75 kg/m.  Wt Readings from Last 3 Encounters:  10/09/16 212 lb (96.2 kg)  08/31/15 207 lb (93.9 kg)  08/25/14 207 lb (93.9 kg)    Patient appears comfortable at rest. HEENT: Conjunctiva and lids normal, oropharynx clear. Neck: Supple, no elevated JVP or carotid bruits, no thyromegaly. Lungs: Clear to auscultation, nonlabored breathing at rest. Cardiac: Regular rate and rhythm, no S3, 2-3/6 higher pitched systolic murmur at base. Abdomen: Soft,  nontender, bowel sounds present. Extremities: No pitting edema, distal pulses 1-2+.  ECG: I personally reviewed the tracing from 08/25/2014 which showed sinus rhythm with prolonged PR interval.  Other Studies Reviewed Today:  Echocardiogram 09/16/2016: Study Conclusions  - Left ventricle: The cavity size was normal. Wall thickness was   increased in a pattern of moderate LVH. Systolic function was   normal. The estimated ejection fraction was in the range of 60%   to 65%. Wall motion was normal; there were no regional wall   motion abnormalities. Doppler parameters are consistent with   abnormal left ventricular relaxation (grade 1 diastolic   dysfunction). Doppler parameters are consistent with high   ventricular filling pressure. - Aortic valve: Moderately calcified annulus. Trileaflet;   moderately thickened leaflets. There was moderate stenosis. There   was mild regurgitation. Valve area (VTI): 1.25 cm^2. Valve area   (Vmax): 1.14 cm^2. - Mitral valve: Mildly calcified annulus. Mildly thickened leaflets. - Left atrium: The atrium was mildly dilated. - Pulmonary arteries: Systolic pressure was mildly increased. PA   peak pressure: 32 mm Hg (S). - Technically adequate study.  Assessment and Plan:  1. History of CAD and reported stent interventions, presumably LAD based on limited records. We have managed him conservatively and he does not report any angina symptoms on medical therapy.  2. Moderate aortic stenosis with mild aortic regurgitation by recent echocardiogram.  3. Hyperlipidemia, continues on Pravachol. Keep follow-up with Dr. Luan Pulling.  4. Bilateral carotid artery disease. Doppler studies from February 3154 showed chronic LICA occlusion and 00-86% RICA stenosis. He is on aspirin and statin.  Current medicines were reviewed with the patient today.   Orders Placed This Encounter  Procedures  . EKG 12-Lead    Disposition: Follow-up in one year.  Signed, Satira Sark, MD, [facility] 10/09/2016 11:37 AM    [facility] at [place]. [address], [place], [place] [address] Phone: [phone]; Fax: [phone]

## 2016-10-09 ENCOUNTER — Ambulatory Visit (INDEPENDENT_AMBULATORY_CARE_PROVIDER_SITE_OTHER): Payer: Medicare Other | Admitting: Cardiology

## 2016-10-09 ENCOUNTER — Encounter: Payer: Self-pay | Admitting: Cardiology

## 2016-10-09 VITALS — BP 102/60 | HR 86 | Ht 72.0 in | Wt 212.0 lb

## 2016-10-09 DIAGNOSIS — I739 Peripheral vascular disease, unspecified: Secondary | ICD-10-CM

## 2016-10-09 DIAGNOSIS — I779 Disorder of arteries and arterioles, unspecified: Secondary | ICD-10-CM | POA: Diagnosis not present

## 2016-10-09 DIAGNOSIS — E782 Mixed hyperlipidemia: Secondary | ICD-10-CM

## 2016-10-09 DIAGNOSIS — I350 Nonrheumatic aortic (valve) stenosis: Secondary | ICD-10-CM

## 2016-10-09 DIAGNOSIS — I2510 Atherosclerotic heart disease of native coronary artery without angina pectoris: Secondary | ICD-10-CM

## 2016-10-09 NOTE — Patient Instructions (Signed)
Medication Instructions:  Your physician recommends that you continue on your current medications as directed. Please refer to the Current Medication list given to you today.   Labwork: NONE  Testing/Procedures: Your physician has requested that you have a carotid duplex. This test is an ultrasound of the carotid arteries in your neck. It looks at blood flow through these arteries that supply the brain with blood. Allow one hour for this exam. There are no restrictions or special instructions.    Follow-Up: Your physician wants you to follow-up in: 1 YEAR.  You will receive a reminder letter in the mail two months in advance. If you don't receive a letter, please call our office to schedule the follow-up appointment.   Any Other Special Instructions Will Be Listed Below (If Applicable).     If you need a refill on your cardiac medications before your next appointment, please call your pharmacy.   

## 2016-10-10 ENCOUNTER — Ambulatory Visit: Payer: Medicare Other

## 2016-10-10 DIAGNOSIS — I739 Peripheral vascular disease, unspecified: Principal | ICD-10-CM

## 2016-10-10 DIAGNOSIS — I779 Disorder of arteries and arterioles, unspecified: Secondary | ICD-10-CM

## 2016-10-10 NOTE — Progress Notes (Unsigned)
Ogletree geor

## 2016-10-14 DIAGNOSIS — Z0000 Encounter for general adult medical examination without abnormal findings: Secondary | ICD-10-CM | POA: Diagnosis not present

## 2016-10-14 DIAGNOSIS — R609 Edema, unspecified: Secondary | ICD-10-CM | POA: Diagnosis not present

## 2016-10-14 DIAGNOSIS — I2510 Atherosclerotic heart disease of native coronary artery without angina pectoris: Secondary | ICD-10-CM | POA: Diagnosis not present

## 2016-10-14 DIAGNOSIS — E785 Hyperlipidemia, unspecified: Secondary | ICD-10-CM | POA: Diagnosis not present

## 2016-10-14 DIAGNOSIS — E1140 Type 2 diabetes mellitus with diabetic neuropathy, unspecified: Secondary | ICD-10-CM | POA: Diagnosis not present

## 2016-11-19 DIAGNOSIS — H52203 Unspecified astigmatism, bilateral: Secondary | ICD-10-CM | POA: Diagnosis not present

## 2016-11-19 DIAGNOSIS — H524 Presbyopia: Secondary | ICD-10-CM | POA: Diagnosis not present

## 2017-01-16 DIAGNOSIS — I10 Essential (primary) hypertension: Secondary | ICD-10-CM | POA: Diagnosis not present

## 2017-01-16 DIAGNOSIS — E039 Hypothyroidism, unspecified: Secondary | ICD-10-CM | POA: Diagnosis not present

## 2017-02-05 ENCOUNTER — Encounter: Payer: Self-pay | Admitting: *Deleted

## 2017-02-05 ENCOUNTER — Ambulatory Visit (INDEPENDENT_AMBULATORY_CARE_PROVIDER_SITE_OTHER): Payer: Medicare Other | Admitting: Physician Assistant

## 2017-02-05 ENCOUNTER — Encounter: Payer: Self-pay | Admitting: Physician Assistant

## 2017-02-05 VITALS — BP 114/62 | HR 82 | Ht 72.0 in | Wt 214.0 lb

## 2017-02-05 DIAGNOSIS — I25119 Atherosclerotic heart disease of native coronary artery with unspecified angina pectoris: Secondary | ICD-10-CM

## 2017-02-05 DIAGNOSIS — I350 Nonrheumatic aortic (valve) stenosis: Secondary | ICD-10-CM

## 2017-02-05 DIAGNOSIS — I779 Disorder of arteries and arterioles, unspecified: Secondary | ICD-10-CM

## 2017-02-05 DIAGNOSIS — I10 Essential (primary) hypertension: Secondary | ICD-10-CM | POA: Diagnosis not present

## 2017-02-05 DIAGNOSIS — E782 Mixed hyperlipidemia: Secondary | ICD-10-CM

## 2017-02-05 DIAGNOSIS — R079 Chest pain, unspecified: Secondary | ICD-10-CM | POA: Diagnosis not present

## 2017-02-05 DIAGNOSIS — I739 Peripheral vascular disease, unspecified: Secondary | ICD-10-CM

## 2017-02-05 MED ORDER — LISINOPRIL 5 MG PO TABS: 5.0000 mg | ORAL_TABLET | Freq: Every day | ORAL | 3 refills | Status: DC

## 2017-02-05 MED ORDER — ISOSORBIDE MONONITRATE ER 30 MG PO TB24
30.0000 mg | ORAL_TABLET | Freq: Every day | ORAL | 3 refills | Status: DC
Start: 2017-02-05 — End: 2017-04-14

## 2017-02-05 NOTE — Patient Instructions (Signed)
Medication Instructions:  Your physician has recommended you make the following change in your medication:  Decrease Lisinopril to 5 mg Daily  Start Imdur 30 mg Daily    Labwork: NONE  Testing/Procedures: Your physician has requested that you have a lexiscan myoview. For further information please visit [email or web address]. Please follow instruction sheet, as given.    Follow-Up: Your physician recommends that you schedule a follow-up appointment with Dr. Domenic Polite   Any Other Special Instructions Will Be Listed Below (If Applicable).     If you need a refill on your cardiac medications before your next appointment, please call your pharmacy.

## 2017-02-05 NOTE — Progress Notes (Signed)
Cardiology Office Note    Date:  02/05/2017   ID:  IANMICHAEL AMESCUA, DOB 10/24/1926, MRN [id number]  PCP:  [name], MD  Cardiologist: Dr. Domenic Polite  No chief complaint on file.   History of Present Illness:   William Walker is a 83 y.o. male who is being seen today for the evaluation of chest pain at the request of [name], MD.  Patient has a history of CAD with reportedly an MI in 1999 and 2 stents in the LAD. Records not available. Also has hypertension, DM type II, HLD. 2-D echo 09/2016 LVEF 60-65% with grade 1 DD and increased filling pressures, moderate aortic stenosis with mild AI. He was having some edema that was managed with when necessary Lasix. Saw Dr. Domenic Polite 10/09/16 and was having some dizziness and low blood pressures. Lisinopril was decreased to 10 mg daily. He also has bilateral carotid artery disease with a chronic LICA occlusion and 76-28% R ICA stenosis 10/2016. He was not having chest pain at that time and has been managed medically.  Complains of chest pressure with little exertion runs down left arm, some dyspnea on exertion. Has been going on for just over one month. He says he can hardly do anything without getting some chest pressure. He was able to golf without symptoms a week ago. Ankles are swelling some and he uses Lasix about once a week. He says he was putting salt on his watermelon to help bring his blood pressure up. He only decrease his lisinopril about a week ago. Sleeping a lot.   Past Medical History:  Diagnosis Date  . Coronary atherosclerosis of native coronary artery    Reportedly 2 stents - presumably LAD (records not available)  . CVD (cerebrovascular disease)   . Essential hypertension, benign   . GERD (gastroesophageal reflux disease)   . Hyperlipidemia   . MI (myocardial infarction) [facility])    October 1999  . Type 2 diabetes mellitus with diabetic neuropathy [facility])     Past Surgical History:  Procedure Laterality Date  . Arch  cerebral ateriogram  12/11/2005   Rosetta Posner MD  . CATARACT EXTRACTION, BILATERAL    . INGUINAL HERNIA REPAIR Left 12/09/2012   Procedure: HERNIA REPAIR INGUINAL with mesh;  Surgeon: [name], MD;  Location: [facility];  Service: General;  Laterality: Left;  . INSERTION OF MESH Left 12/09/2012   Procedure: INSERTION OF MESH;  Surgeon: [name], MD;  Location: [facility];  Service: General;  Laterality: Left;    Current Medications: Current Meds  Medication Sig  . aspirin EC 325 MG tablet Take 325 mg by mouth daily.    . furosemide (LASIX) 40 MG tablet Take 40 mg by mouth daily as needed.   . lansoprazole (PREVACID) 15 MG capsule Take 15 mg by mouth daily.  Marland Kitchen levothyroxine (SYNTHROID, LEVOTHROID) 50 MCG tablet Take 50 mcg by mouth.   . potassium chloride SA (KLOR-CON M20) 20 MEQ tablet Take 20 mEq by mouth.   . pravastatin (PRAVACHOL) 40 MG tablet Take 40 mg by mouth daily.    . traMADol (ULTRAM) 50 MG tablet Take 50 mg by mouth every 6 (six) hours as needed for pain.   . [DISCONTINUED] lisinopril (PRINIVIL,ZESTRIL) 20 MG tablet Take 20 mg by mouth daily.       Allergies:   Penicillins   Social History   Social History  . Marital status: Married    Spouse name: N/A  . Number  of children: N/A  . Years of education: N/A   Social History Main Topics  . Smoking status: Former Smoker    Types: Pipe    Quit date: 12/03/1948  . Smokeless tobacco: Never Used  . Alcohol use No  . Drug use: No  . Sexual activity: Yes    Birth control/ protection: None   Other Topics Concern  . None   Social History Narrative  . None     Family History:  The patient's family history includes Coronary artery disease in his father.   ROS:   Please see the history of present illness.    Review of Systems  Constitution: Positive for weakness and malaise/fatigue.  HENT: Negative.   Cardiovascular: Positive for chest pain, dyspnea on exertion and leg swelling.  Respiratory: Negative.     Endocrine: Negative.   Hematologic/Lymphatic: Negative.   Musculoskeletal: Negative.   Gastrointestinal: Negative.   Genitourinary: Positive for frequency.  Neurological: Positive for sensory change.   All other systems reviewed and are negative.   PHYSICAL EXAM:   VS:  BP 114/62   Pulse 82   Ht 6' (1.829 m)   Wt 214 lb (97.1 kg)   SpO2 98%   BMI 29.02 kg/m   Physical Exam  GEN: Well nourished, well developed, in no acute distress  Neck: no JVD, carotid bruits, or masses Cardiac:RRR; 7-0/9 harsh systolic murmur at the left sternal border Respiratory:  clear to auscultation bilaterally, normal work of breathing GI: soft, nontender, nondistended, + BS Ext: Trace of ankle edema bilaterally without cyanosis, clubbing, Good distal pulses bilaterally Neuro:  Alert and Oriented x 3 Psych: euthymic mood, full affect  Wt Readings from Last 3 Encounters:  02/05/17 214 lb (97.1 kg)  10/09/16 212 lb (96.2 kg)  08/31/15 207 lb (93.9 kg)      Studies/Labs Reviewed:   EKG:  EKG is  ordered today.  The ekg ordered today demonstrates Normal sinus rhythm with first-degree AV block, no acute change  Recent Labs: No results found for requested labs within last 8760 hours.   Lipid Panel No results found for: CHOL, TRIG, HDL, CHOLHDL, VLDL, LDLCALC, LDLDIRECT  Additional studies/ records that were reviewed today include:   Echocardiogram 09/16/2016: Study Conclusions   - Left ventricle: The cavity size was normal. Wall thickness was   increased in a pattern of moderate LVH. Systolic function was   normal. The estimated ejection fraction was in the range of 60%   to 65%. Wall motion was normal; there were no regional wall   motion abnormalities. Doppler parameters are consistent with   abnormal left ventricular relaxation (grade 1 diastolic   dysfunction). Doppler parameters are consistent with high   ventricular filling pressure. - Aortic valve: Moderately calcified annulus.  Trileaflet;   moderately thickened leaflets. There was moderate stenosis. There   was mild regurgitation. Valve area (VTI): 1.25 cm^2. Valve area   (Vmax): 1.14 cm^2. - Mitral valve: Mildly calcified annulus. Mildly thickened leaflets. - Left atrium: The atrium was mildly dilated. - Pulmonary arteries: Systolic pressure was mildly increased. PA   peak pressure: 32 mm Hg (S). - Technically adequate study.   Carotid Dopplers 5/10/18Stable 60-79% RICA stenosis and chronic LICA and left vertebral artery occlusions.    ASSESSMENT:    1. Chest pain, unspecified type   2. Atherosclerosis of native coronary artery of native heart with angina pectoris ([place])   3. Essential hypertension   4. Bilateral carotid artery disease ([place])  5. Mixed hyperlipidemia   6. Nonrheumatic aortic valve stenosis      PLAN:  In order of problems listed above:  Chest pain consistent with angina worrisome for ischemia. Had a long discussion with he and his wife concerning possible cardiac catheterization versus Lexi scan Myoview. He would like to remain active but given his age and carotid disease like to start off with the stress test. I will decrease his lisinopril to 5 mg once daily and add Imdur 30 mg once a day. Nitroglycerin when necessary. Proceed to the emergency room for prolonged chest pain. Follow-up with Dr. Domenic Polite 02/17/17.  CAD status post 2 stents by Dr. Evangeline Dakin in 1999. Records unavailable, continue aspirin and pravastatin  Essential hypertension blood pressures been running low. Decreasing lisinopril further with the addition of Imdur  Bilateral carotid artery stenosis with carotid Dopplers checked recently and unchanged  Hyperlipidemia continue pravastatin  Moderate aortic stenosis on most recent echo      Medication Adjustments/Labs and Tests Ordered: Current medicines are reviewed at length with the patient today.  Concerns regarding medicines are outlined above.  Medication  changes, Labs and Tests ordered today are listed in the Patient Instructions below. There are no Patient Instructions on file for this visit.   Sumner Boast, PA-C  02/05/2017 2:47 PM    [facility] [place], [place], [place]  [address] Phone: [phone]; Fax: [phone]

## 2017-02-13 ENCOUNTER — Encounter (HOSPITAL_COMMUNITY): Payer: Self-pay

## 2017-02-13 ENCOUNTER — Encounter (HOSPITAL_BASED_OUTPATIENT_CLINIC_OR_DEPARTMENT_OTHER)
Admission: RE | Admit: 2017-02-13 | Discharge: 2017-02-13 | Disposition: A | Discharge status: Home / Self Care | Payer: Medicare Other | Source: Ambulatory Visit | Attending: Physician Assistant | Admitting: Physician Assistant

## 2017-02-13 ENCOUNTER — Encounter (HOSPITAL_COMMUNITY)
Admission: RE | Admit: 2017-02-13 | Discharge: 2017-02-13 | Disposition: A | Discharge status: Home / Self Care | Payer: Medicare Other | Source: Ambulatory Visit | Attending: Physician Assistant | Admitting: Physician Assistant

## 2017-02-13 DIAGNOSIS — R079 Chest pain, unspecified: Secondary | ICD-10-CM | POA: Diagnosis not present

## 2017-02-13 HISTORY — DX: Heart failure, unspecified: I50.9

## 2017-02-13 LAB — NM MYOCAR MULTI W/SPECT W/WALL MOTION / EF
CHL CUP NUCLEAR SDS: 6
CHL CUP RESTING HR STRESS: 65 {beats}/min
LHR: 0.29
LVDIAVOL: 89 mL (ref 62–150)
LVSYSVOL: 24 mL
NUC STRESS TID: 0.8
Peak HR: 82 {beats}/min
SRS: 3
SSS: 9

## 2017-02-13 MED ORDER — TECHNETIUM TC 99M TETROFOSMIN IV KIT
10.0000 | PACK | Freq: Once | INTRAVENOUS | Status: AC | PRN
  Administered 2017-02-13: 10.2 via INTRAVENOUS

## 2017-02-13 MED ORDER — REGADENOSON 0.4 MG/5ML IV SOLN
INTRAVENOUS | Status: AC
  Administered 2017-02-13: 0.4 mg via INTRAVENOUS
  Filled 2017-02-13: qty 5

## 2017-02-13 MED ORDER — SODIUM CHLORIDE 0.9% FLUSH
INTRAVENOUS | Status: AC
  Administered 2017-02-13: 10 mL via INTRAVENOUS
  Filled 2017-02-13: qty 10

## 2017-02-13 MED ORDER — TECHNETIUM TC 99M TETROFOSMIN IV KIT
30.0000 | PACK | Freq: Once | INTRAVENOUS | Status: AC | PRN
  Administered 2017-02-13: 29.8 via INTRAVENOUS

## 2017-02-17 NOTE — Progress Notes (Signed)
Cardiology Office Note  Date: 02/18/2017   ID: William Walker, DOB 1926-07-05, MRN [id number]  PCP: [name], MD  Primary Cardiologist: [name], MD   Chief Complaint  Patient presents with  . Coronary Artery Disease    History of Present Illness: William Walker is an 83 y.o. male seen recently by Ms. Vita Barley on September 5. He was reporting angina symptoms at that time and referred for follow-up ischemic testing. In addition, lisinopril dose was decreased to 5 mg daily and he was started on Imdur 30 mg daily.  He is here with his wife today for follow-up. He cannot tell any major difference following the medication adjustments. He describes to me probable angina symptoms when he is up walking for exercise, also has neuropathy and leg pain which limits him. He does not have any chest pain at rest. He does report that his blood pressure has been lower than normal and wonders whether this could be related. This is despite cutting the lisinopril back to 5 mg daily.  Exercise Myoview done recently demonstrated probable mild ischemia in the inferior/septal distribution with LVEF 72%. I reviewed the results with him.  Reviewed his lab work from May per Dr. Luan Pulling, outlined below.  Past Medical History:  Diagnosis Date  . CHF (congestive heart failure) ([place])   . Coronary atherosclerosis of native coronary artery    Reportedly 2 stents - presumably LAD (records not available)  . CVD (cerebrovascular disease)   . Essential hypertension, benign   . GERD (gastroesophageal reflux disease)   . Hyperlipidemia   . MI (myocardial infarction) [facility])    October 1999  . Type 2 diabetes mellitus with diabetic neuropathy [facility])     Past Surgical History:  Procedure Laterality Date  . Arch cerebral ateriogram  12/11/2005   Rosetta Posner MD  . CATARACT EXTRACTION, BILATERAL    . INGUINAL HERNIA REPAIR Left 12/09/2012   Procedure: HERNIA REPAIR INGUINAL with mesh;  Surgeon: [name], MD;  Location: [facility];  Service: General;  Laterality: Left;  . INSERTION OF MESH Left 12/09/2012   Procedure: INSERTION OF MESH;  Surgeon: [name], MD;  Location: [facility];  Service: General;  Laterality: Left;    Current Outpatient Prescriptions  Medication Sig Dispense Refill  . aspirin EC 325 MG tablet Take 325 mg by mouth daily.      . furosemide (LASIX) 40 MG tablet Take 40 mg by mouth daily as needed.     . isosorbide mononitrate (IMDUR) 30 MG 24 hr tablet Take 1 tablet (30 mg total) by mouth daily. 90 tablet 3  . lansoprazole (PREVACID) 15 MG capsule Take 15 mg by mouth daily.    Marland Kitchen levothyroxine (SYNTHROID, LEVOTHROID) 50 MCG tablet Take 50 mcg by mouth.     . potassium chloride SA (KLOR-CON M20) 20 MEQ tablet Take 20 mEq by mouth.     . pravastatin (PRAVACHOL) 40 MG tablet Take 40 mg by mouth daily.      . traMADol (ULTRAM) 50 MG tablet Take 50 mg by mouth every 6 (six) hours as needed for pain.      No current facility-administered medications for this visit.    Allergies:  Penicillins   Social History: The patient  reports that he quit smoking about 68 years ago. His smoking use included Pipe. He has never used smokeless tobacco. He reports that he does not drink alcohol or use drugs.   ROS:  Please see the history of present illness. Otherwise, complete review of systems is positive for hearing loss.  All other systems are reviewed and negative.   Physical Exam: VS:  BP (!) 106/58   Pulse 69   Ht 6' (1.829 m)   Wt 212 lb (96.2 kg)   SpO2 95%   BMI 28.75 kg/m , BMI Body mass index is 28.75 kg/m.  Wt Readings from Last 3 Encounters:  02/18/17 212 lb (96.2 kg)  02/05/17 214 lb (97.1 kg)  10/09/16 212 lb (96.2 kg)    General: Elderly male, appears comfortable at rest. HEENT: Conjunctiva and lids normal, oropharynx clear. Neck: Supple, no elevated JVP or carotid bruits, no thyromegaly. Lungs: Clear to auscultation, nonlabored breathing at rest. Cardiac:  Regular rate and rhythm, no S3, soft systolic murmur, no pericardial rub. Abdomen: Soft, nontender, bowel sounds present, no guarding or rebound. Extremities: No pitting edema, distal pulses 2+. Skin: Warm and dry. Musculoskeletal: No kyphosis. Neuropsychiatric: Alert and oriented x3, affect grossly appropriate.  ECG: I personally reviewed the tracing from 02/05/2017 which showed sinus rhythm with prolonged PR interval.  Recent Labwork:  May 2018: Hgb 13.1, platelets 252, BUN 1.27, creatinine 1.27, AST 25, ALT 22, TSH 6.5, HbgA1c 7.3. Cholesterol 168, triglycerides 137, HDL 27, LDL 79  Other Studies Reviewed Today:  Echocardiogram 09/16/2016: Study Conclusions  - Left ventricle: The cavity size was normal. Wall thickness was   increased in a pattern of moderate LVH. Systolic function was   normal. The estimated ejection fraction was in the range of 60%   to 65%. Wall motion was normal; there were no regional wall   motion abnormalities. Doppler parameters are consistent with   abnormal left ventricular relaxation (grade 1 diastolic   dysfunction). Doppler parameters are consistent with high   ventricular filling pressure. - Aortic valve: Moderately calcified annulus. Trileaflet;   moderately thickened leaflets. There was moderate stenosis. There   was mild regurgitation. Valve area (VTI): 1.25 cm^2. Valve area   (Vmax): 1.14 cm^2. - Mitral valve: Mildly calcified annulus. Mildly thickened leaflets. - Left atrium: The atrium was mildly dilated. - Pulmonary arteries: Systolic pressure was mildly increased. PA   peak pressure: 32 mm Hg (S). - Technically adequate study.  Exercise Myoview 02/13/2017:  There was no ST segment deviation noted during stress.  Defect 1: There is a medium defect of mild severity present in the basal inferoseptal, mid inferoseptal, apical septal and apical inferior location. While this is likely due to variable soft tissue attenuation artifact, a mild  degree of ischemia cannot entirely be ruled out.  This is a low risk study.  Nuclear stress EF: 72%.  Assessment and Plan:  1. CAD with exertional angina symptoms. He has a history of previous coronary intervention with stents, details are not clear. Recent Myoview does show a mild region of ischemia in the inferior/inferoseptal wall. We discussed options including medical therapy versus proceeding to invasive cardiac catheterization for evaluation of revascularization options. For now he prefers medical therapy unless symptoms escalate. Plan to have him hold lisinopril and allow blood pressure to come up somewhat since he seems to feel worse with lower blood pressures. Also continue Imdur, aspirin, and statin therapy.  2. Essential hypertension. Recent blood pressure has been low. Discontinue lisinopril for now and follow blood pressure trend at home.  3. Hyperlipidemia, on Pravachol. Recent LDL 79.  4. Type 2 diabetes mellitus, continues to follow with Dr. Luan Pulling. Recent hemoglobin A1c 7.3.  Current medicines  were reviewed with the patient today.  Disposition: Follow-up in one month.  Signed, Satira Sark, MD, [facility] 02/18/2017 1:53 PM    [place] [facility] [address], [place], [place] [address] Phone: [phone]; Fax: [phone]

## 2017-02-18 ENCOUNTER — Encounter: Payer: Self-pay | Admitting: Cardiology

## 2017-02-18 ENCOUNTER — Ambulatory Visit (INDEPENDENT_AMBULATORY_CARE_PROVIDER_SITE_OTHER): Payer: Medicare Other | Admitting: Cardiology

## 2017-02-18 VITALS — BP 106/58 | HR 69 | Ht 72.0 in | Wt 212.0 lb

## 2017-02-18 DIAGNOSIS — E782 Mixed hyperlipidemia: Secondary | ICD-10-CM | POA: Diagnosis not present

## 2017-02-18 DIAGNOSIS — E118 Type 2 diabetes mellitus with unspecified complications: Secondary | ICD-10-CM

## 2017-02-18 DIAGNOSIS — I10 Essential (primary) hypertension: Secondary | ICD-10-CM

## 2017-02-18 DIAGNOSIS — I25119 Atherosclerotic heart disease of native coronary artery with unspecified angina pectoris: Secondary | ICD-10-CM

## 2017-02-18 NOTE — Patient Instructions (Signed)
Your physician recommends that you schedule a follow-up appointment in: 1 month with Dr.McDowell     STOP Lisinopril    Continue all other medications     No lab work or tests ordered today.       Thank you for choosing [facility] !

## 2017-03-12 DIAGNOSIS — Z23 Encounter for immunization: Secondary | ICD-10-CM | POA: Diagnosis not present

## 2017-04-13 NOTE — Progress Notes (Signed)
Cardiology Office Note  Date: 04/14/2017   ID: William Walker, DOB 1927/03/19, MRN [id number]  PCP: [name], MD  Primary Cardiologist: [name], MD   Chief Complaint  Patient presents with  . Coronary Artery Disease    History of Present Illness: William Walker is an 83 y.o. male last seen in September.  Presents today with his wife for a follow-up visit.  He reports increasing exertional angina symptoms over the last several months.  Ischemic testing from September was overall low risk, possible mild inferoseptal ischemia noted in the setting of soft tissue attenuation.  Echocardiogram also revealed moderate aortic stenosis.  At the last visit we had him stop lisinopril completely given relatively low blood pressure.  He has been checking blood pressures at home, systolics are consistently under 150.  We went over his medications.  Current cardiac regimen includes aspirin, Lasix, M Doerr, potassium supplements, and Pravachol.  We have discussed possible cardiac catheterization as a next step to evaluate for any potential revascularization options, and also assess aortic stenosis further.  This would at least give [place] a better understanding of his treatment options beyond medications at age 74.  Past Medical History:  Diagnosis Date  . CHF (congestive heart failure) ([place])   . Coronary atherosclerosis of native coronary artery    Reportedly 2 stents - presumably LAD (records not available)  . CVD (cerebrovascular disease)   . Essential hypertension, benign   . GERD (gastroesophageal reflux disease)   . Hyperlipidemia   . MI (myocardial infarction) [facility])    October 1999  . Type 2 diabetes mellitus with diabetic neuropathy [facility])     Past Surgical History:  Procedure Laterality Date  . Arch cerebral ateriogram  12/11/2005   Rosetta Posner MD  . CATARACT EXTRACTION, BILATERAL      Current Outpatient Medications  Medication Sig Dispense Refill  . aspirin EC 325 MG  tablet Take 325 mg by mouth daily.      . furosemide (LASIX) 40 MG tablet Take 40 mg by mouth daily as needed.     . lansoprazole (PREVACID) 15 MG capsule Take 15 mg by mouth daily.    . potassium chloride SA (KLOR-CON M20) 20 MEQ tablet Take 20 mEq by mouth.     . pravastatin (PRAVACHOL) 40 MG tablet Take 40 mg by mouth daily.      . traMADol (ULTRAM) 50 MG tablet Take 50 mg by mouth every 6 (six) hours as needed for pain.     . isosorbide mononitrate (IMDUR) 30 MG 24 hr tablet Take 1 tablet (30 mg total) 2 (two) times daily by mouth. 180 tablet 3   No current facility-administered medications for this visit.    Allergies:  Penicillins   Social History: The patient  reports that he quit smoking about 68 years ago. His smoking use included pipe. he has never used smokeless tobacco. He reports that he does not drink alcohol or use drugs.   ROS:  Please see the history of present illness. Otherwise, complete review of systems is positive for hearing loss.  All other systems are reviewed and negative.   Physical Exam: VS:  BP (!) 142/82   Pulse 80   Ht 6' (1.829 m)   Wt 193 lb (87.5 kg)   SpO2 98%   BMI 26.18 kg/m , BMI Body mass index is 26.18 kg/m.  Wt Readings from Last 3 Encounters:  04/14/17 193 lb (87.5 kg)  02/18/17 212 lb (  96.2 kg)  02/05/17 214 lb (97.1 kg)    General: Elderly male, appears comfortable at rest. HEENT: Conjunctiva and lids normal, oropharynx clear. Neck: Supple, no elevated JVP or carotid bruits, no thyromegaly. Lungs: Clear to auscultation, nonlabored breathing at rest. Cardiac: Regular rate and rhythm, no S3, 2/6 systolic murmur, no pericardial rub. Abdomen: Soft, nontender, bowel sounds present, no guarding or rebound. Extremities: No pitting edema, distal pulses 2+. Skin: Warm and dry. Musculoskeletal: No kyphosis. Neuropsychiatric: Alert and oriented x3, affect grossly appropriate.  ECG: I personally reviewed the tracing from 02/05/2017 which showed  sinus rhythm with prolonged PR interval.  Recent Labwork:  May 2018: Hgb 13.1, platelets 252, BUN 1.27, creatinine 1.27, AST 25, ALT 22, TSH 6.5, HbgA1c 7.3. Cholesterol 168, triglycerides 137, HDL 27, LDL 79  Other Studies Reviewed Today:  Echocardiogram 09/16/2016: Study Conclusions  - Left ventricle: The cavity size was normal. Wall thickness was increased in a pattern of moderate LVH. Systolic function was normal. The estimated ejection fraction was in the range of 60% to 65%. Wall motion was normal; there were no regional wall motion abnormalities. Doppler parameters are consistent with abnormal left ventricular relaxation (grade 1 diastolic dysfunction). Doppler parameters are consistent with high ventricular filling pressure. - Aortic valve: Moderately calcified annulus. Trileaflet; moderately thickened leaflets. There was moderate stenosis. There was mild regurgitation. Valve area (VTI): 1.25 cm^2. Valve area (Vmax): 1.14 cm^2. - Mitral valve: Mildly calcified annulus. Mildly thickened leaflets. - Left atrium: The atrium was mildly dilated. - Pulmonary arteries: Systolic pressure was mildly increased. PA peak pressure: 32 mm Hg (S). - Technically adequate study.  Exercise Myoview 02/13/2017:  There was no ST segment deviation noted during stress.  Defect 1: There is a medium defect of mild severity present in the basal inferoseptal, mid inferoseptal, apical septal and apical inferior location. While this is likely due to variable soft tissue attenuation artifact, a mild degree of ischemia cannot entirely be ruled out.  This is a low risk study.  Nuclear stress EF: 72%.  Assessment and Plan:  1.  CAD with progressive exertional angina.  He has had a previous history of stent interventions, although details are not clear.  After extensive discussion today, plan is to increase Imdur to 30 mg twice daily.  I plan to bring him back to the office after  Thanksgiving to discuss proceeding with a diagnostic right and left heart catheterization to better understand his treatment options.  He wanted to consider the procedure a little more in the meanwhile, but feels like he will likely proceed.  2.  Essential hypertension, continue with current regimen.  He is checking blood pressure at home.  3.  Moderate aortic stenosis by echocardiogram in April.  4.  Type 2 diabetes mellitus, follows with Dr. Luan Pulling.  Last hemoglobin A1c 7.3.  Current medicines were reviewed with the patient today.  Disposition: Follow-up in the next few weeks.  Signed, Satira Sark, MD, [facility] 04/14/2017 2:18 PM    [facility] [address] [place], [place] [address] Phone: [phone]; Fax: [phone]

## 2017-04-14 ENCOUNTER — Encounter: Payer: Self-pay | Admitting: Cardiology

## 2017-04-14 ENCOUNTER — Ambulatory Visit: Payer: Medicare Other | Admitting: Cardiology

## 2017-04-14 VITALS — BP 142/82 | HR 80 | Ht 72.0 in | Wt 193.0 lb

## 2017-04-14 DIAGNOSIS — I350 Nonrheumatic aortic (valve) stenosis: Secondary | ICD-10-CM | POA: Diagnosis not present

## 2017-04-14 DIAGNOSIS — I25119 Atherosclerotic heart disease of native coronary artery with unspecified angina pectoris: Secondary | ICD-10-CM | POA: Diagnosis not present

## 2017-04-14 DIAGNOSIS — E118 Type 2 diabetes mellitus with unspecified complications: Secondary | ICD-10-CM

## 2017-04-14 DIAGNOSIS — I10 Essential (primary) hypertension: Secondary | ICD-10-CM | POA: Diagnosis not present

## 2017-04-14 MED ORDER — ISOSORBIDE MONONITRATE ER 30 MG PO TB24: 30.0000 mg | ORAL_TABLET | Freq: Two times a day (BID) | ORAL | 3 refills | Status: DC

## 2017-04-14 NOTE — Patient Instructions (Signed)
Your physician recommends that you schedule a follow-up appointment in: 3 weeks with Dr.McDowell   INCREASE Imdur to 30 mg twice a day    All other medications stay the same    No lab work or tests ordered today.       Thank you for choosing [facility] !

## 2017-05-01 DIAGNOSIS — I739 Peripheral vascular disease, unspecified: Secondary | ICD-10-CM | POA: Diagnosis not present

## 2017-05-01 DIAGNOSIS — I2510 Atherosclerotic heart disease of native coronary artery without angina pectoris: Secondary | ICD-10-CM | POA: Diagnosis not present

## 2017-05-01 DIAGNOSIS — E119 Type 2 diabetes mellitus without complications: Secondary | ICD-10-CM | POA: Diagnosis not present

## 2017-05-01 DIAGNOSIS — I10 Essential (primary) hypertension: Secondary | ICD-10-CM | POA: Diagnosis not present

## 2017-05-07 ENCOUNTER — Ambulatory Visit: Payer: Medicare Other | Admitting: Cardiology

## 2017-05-07 ENCOUNTER — Encounter: Payer: Self-pay | Admitting: Cardiology

## 2017-05-07 VITALS — BP 158/80 | HR 85 | Ht 72.0 in | Wt 215.0 lb

## 2017-05-07 DIAGNOSIS — I350 Nonrheumatic aortic (valve) stenosis: Secondary | ICD-10-CM

## 2017-05-07 DIAGNOSIS — E118 Type 2 diabetes mellitus with unspecified complications: Secondary | ICD-10-CM

## 2017-05-07 DIAGNOSIS — I25119 Atherosclerotic heart disease of native coronary artery with unspecified angina pectoris: Secondary | ICD-10-CM

## 2017-05-07 DIAGNOSIS — I10 Essential (primary) hypertension: Secondary | ICD-10-CM

## 2017-05-07 MED ORDER — METOPROLOL SUCCINATE ER 25 MG PO TB24: 12.5000 mg | ORAL_TABLET | Freq: Every day | ORAL | 3 refills | Status: DC

## 2017-05-07 NOTE — Patient Instructions (Signed)
Medication Instructions:  START TOPROL XL 12.5 MG DAILY (1/2 TABLET)   Labwork: NONE  Testing/Procedures: NONE  Follow-Up: Your physician recommends that you schedule a follow-up appointment in: 3 MONTHS    Any Other Special Instructions Will Be Listed Below (If Applicable).     If you need a refill on your cardiac medications before your next appointment, please call your pharmacy.

## 2017-05-07 NOTE — Progress Notes (Signed)
Cardiology Office Note  Date: 05/07/2017   ID: DEZMOND DOWNIE, DOB 04-12-1927, MRN [id number]  PCP: [name], MD  Primary Cardiologist: [name], MD   Chief Complaint  Patient presents with  . Coronary Artery Disease    History of Present Illness: William Walker is an 83 y.o. male last seen in November and follow-up of exertional angina symptoms. Ischemic testing from September was overall low risk, possible mild inferoseptal ischemia noted in the setting of soft tissue attenuation.  Echocardiogram also revealed moderate aortic stenosis.  Imdur was increased to 30 mg twice daily and he comes back to discuss symptoms and next step.  He is here today with his daughter.  He states that he feels like he is able to walk further without angina since the increase in Imdur.  I talked with him about continuing medical therapy adjustments versus pursuing other invasive cardiac evaluation to at least understand his treatment options in more detail.  He does not want to pursue a cardiac catheterization at this point, certainly not unreasonable.  Current cardiac medical regimen includes aspirin, Imdur, Pravachol, and as needed Lasix for leg swelling.  We discussed adding low-dose Toprol-XL next.  Past Medical History:  Diagnosis Date  . CHF (congestive heart failure) ([place])   . Coronary atherosclerosis of native coronary artery    Reportedly 2 stents - presumably LAD (records not available)  . CVD (cerebrovascular disease)   . Essential hypertension, benign   . GERD (gastroesophageal reflux disease)   . Hyperlipidemia   . MI (myocardial infarction) [facility])    October 1999  . Type 2 diabetes mellitus with diabetic neuropathy [facility])     Past Surgical History:  Procedure Laterality Date  . Arch cerebral ateriogram  12/11/2005   Rosetta Posner MD  . CATARACT EXTRACTION, BILATERAL    . INGUINAL HERNIA REPAIR Left 12/09/2012   Procedure: HERNIA REPAIR INGUINAL with mesh;  Surgeon: [name], MD;  Location: [facility];  Service: General;  Laterality: Left;  . INSERTION OF MESH Left 12/09/2012   Procedure: INSERTION OF MESH;  Surgeon: [name], MD;  Location: [facility];  Service: General;  Laterality: Left;    Current Outpatient Medications  Medication Sig Dispense Refill  . aspirin EC 325 MG tablet Take 325 mg by mouth daily.      . furosemide (LASIX) 40 MG tablet Take 40 mg by mouth daily as needed.     . isosorbide mononitrate (IMDUR) 30 MG 24 hr tablet Take 1 tablet (30 mg total) 2 (two) times daily by mouth. 180 tablet 3  . lansoprazole (PREVACID) 15 MG capsule Take 15 mg by mouth daily.    . potassium chloride SA (KLOR-CON M20) 20 MEQ tablet Take 20 mEq by mouth.     . pravastatin (PRAVACHOL) 40 MG tablet Take 40 mg by mouth daily.      . traMADol (ULTRAM) 50 MG tablet Take 50 mg by mouth every 6 (six) hours as needed for pain.     . metoprolol succinate (TOPROL XL) 25 MG 24 hr tablet Take 0.5 tablets (12.5 mg total) by mouth daily. 45 tablet 3   No current facility-administered medications for this visit.    Allergies:  Penicillins   Social History: The patient  reports that he quit smoking about 68 years ago. His smoking use included pipe and cigars. His smokeless tobacco use includes chew. He reports that he does not drink alcohol or use drugs.  ROS:  Please see the history of present illness. Otherwise, complete review of systems is positive for arthritic stiffness.  All other systems are reviewed and negative.   Physical Exam: VS:  BP (!) 158/80   Pulse 85   Ht 6' (1.829 m)   Wt 215 lb (97.5 kg)   SpO2 96% Comment: on room air  BMI 29.16 kg/m , BMI Body mass index is 29.16 kg/m.  Wt Readings from Last 3 Encounters:  05/07/17 215 lb (97.5 kg)  04/14/17 193 lb (87.5 kg)  02/18/17 212 lb (96.2 kg)    General: Elderly male, appears comfortable at rest. HEENT: Conjunctiva and lids normal, oropharynx clear. Neck: Supple, no elevated JVP or carotid  bruits, no thyromegaly. Lungs: Clear to auscultation, nonlabored breathing at rest. Cardiac: Regular rate and rhythm, no S3, 2/6 systolic murmur, no pericardial rub. Abdomen: Soft, nontender, bowel sounds present, no guarding or rebound. Extremities: No pitting edema, distal pulses 2+. Skin: Warm and dry. Musculoskeletal: No kyphosis. Neuropsychiatric: Alert and oriented x3, affect grossly appropriate.  ECG: I personally reviewed the tracing from 02/05/2017 which shows sinus rhythm with prolonged PR interval.  Recent Labwork:  May 2018: Hgb 13.1, platelets 252, BUN 1.27, creatinine 1.27, AST 25, ALT 22, TSH 6.5, HbgA1c 7.3. Cholesterol 168, triglycerides 137, HDL 27, LDL 79  Other Studies Reviewed Today:  Echocardiogram 09/16/2016: Study Conclusions  - Left ventricle: The cavity size was normal. Wall thickness was increased in a pattern of moderate LVH. Systolic function was normal. The estimated ejection fraction was in the range of 60% to 65%. Wall motion was normal; there were no regional wall motion abnormalities. Doppler parameters are consistent with abnormal left ventricular relaxation (grade 1 diastolic dysfunction). Doppler parameters are consistent with high ventricular filling pressure. - Aortic valve: Moderately calcified annulus. Trileaflet; moderately thickened leaflets. There was moderate stenosis. There was mild regurgitation. Valve area (VTI): 1.25 cm^2. Valve area (Vmax): 1.14 cm^2. - Mitral valve: Mildly calcified annulus. Mildly thickened leaflets. - Left atrium: The atrium was mildly dilated. - Pulmonary arteries: Systolic pressure was mildly increased. PA peak pressure: 32 mm Hg (S). - Technically adequate study.  Exercise Myoview 02/13/2017:  There was no ST segment deviation noted during stress.  Defect 1: There is a medium defect of mild severity present in the basal inferoseptal, mid inferoseptal, apical septal and apical  inferior location. While this is likely due to variable soft tissue attenuation artifact, a mild degree of ischemia cannot entirely be ruled out.  This is a low risk study.  Nuclear stress EF: 72%.  Assessment and Plan:  1.  CAD with exertional angina.  We have discussed options for management and at this point we will continue with medical therapy titration.  He tolerated the increase in Imdur, and we will add Toprol-XL beginning at 12.5 mg daily.  Office follow-up arranged.  2.  Essential hypertension, blood pressure elevated today.  Continue medical therapy adjustments.  3.  Moderate aortic stenosis.  Echocardiogram from April outlined above.  Will consider follow-up study around the time of his next visit.  4.  Type 2 diabetes mellitus, followed by Dr. Luan Pulling.  This is managed by diet.  Current medicines were reviewed with the patient today.  Disposition: Follow-up in 3 months, sooner if needed.  Signed, Satira Sark, MD, [facility] 05/07/2017 1:19 PM    [place] at [facility] [address], [place], [place] [address] Phone: [phone]; Fax: [phone]

## 2017-07-31 DIAGNOSIS — I10 Essential (primary) hypertension: Secondary | ICD-10-CM | POA: Diagnosis not present

## 2017-07-31 DIAGNOSIS — E1140 Type 2 diabetes mellitus with diabetic neuropathy, unspecified: Secondary | ICD-10-CM | POA: Diagnosis not present

## 2017-07-31 DIAGNOSIS — E1151 Type 2 diabetes mellitus with diabetic peripheral angiopathy without gangrene: Secondary | ICD-10-CM | POA: Diagnosis not present

## 2017-07-31 DIAGNOSIS — I25119 Atherosclerotic heart disease of native coronary artery with unspecified angina pectoris: Secondary | ICD-10-CM | POA: Diagnosis not present

## 2017-08-07 NOTE — Progress Notes (Signed)
Cardiology Office Note  Date: 08/08/2017   ID: William Walker, DOB [birth year], MRN [id number]  PCP: [name], MD  Primary Cardiologist: [name], MD   Chief Complaint  Patient presents with  . Coronary Artery Disease  . Aortic Stenosis    History of Present Illness: William Walker is an 84 y.o. male last seen in December 2018.  He presents for a follow-up visit with his daughter today.  He states that his stamina continues to decline and he has recurring angina limiting his activity despite medication adjustments.  He did not tolerate M Doerr at twice daily dosing, has cut it back to once a day.  Cardiac structural and ischemic testing from last year is outlined below.  We have been managing him medically so far after initial discussion.  Reviewed his current medications which are outlined below.  We discussed increasing Toprol-XL to 25 mg daily.  We also had a discussion about the possibility of considering a cardiac catheterization to at least understand what his treatment options might be beyond medical therapy.  He also needs an echocardiogram to reassess degree of aortic stenosis.  Past Medical History:  Diagnosis Date  . CHF (congestive heart failure) ([place])   . Coronary atherosclerosis of native coronary artery    Reportedly 2 stents - presumably LAD (records not available)  . CVD (cerebrovascular disease)   . Essential hypertension, benign   . GERD (gastroesophageal reflux disease)   . Hyperlipidemia   . MI (myocardial infarction) [facility])    October 1999  . Type 2 diabetes mellitus with diabetic neuropathy [facility])     Past Surgical History:  Procedure Laterality Date  . Arch cerebral ateriogram  12/11/2005   Rosetta Posner MD  . CATARACT EXTRACTION, BILATERAL    . INGUINAL HERNIA REPAIR Left 12/09/2012   Procedure: HERNIA REPAIR INGUINAL with mesh;  Surgeon: [name], MD;  Location: [facility];  Service: General;  Laterality: Left;  . INSERTION OF MESH Left  12/09/2012   Procedure: INSERTION OF MESH;  Surgeon: [name], MD;  Location: [facility];  Service: General;  Laterality: Left;    Current Outpatient Medications  Medication Sig Dispense Refill  . aspirin EC 325 MG tablet Take 325 mg by mouth daily.      Marland Kitchen doxycycline (VIBRAMYCIN) 100 MG capsule     . furosemide (LASIX) 40 MG tablet Take 40 mg by mouth daily as needed.     . isosorbide mononitrate (IMDUR) 30 MG 24 hr tablet Take 30 mg by mouth daily.    . lansoprazole (PREVACID) 15 MG capsule Take 15 mg by mouth daily.    . metoprolol succinate (TOPROL XL) 25 MG 24 hr tablet Take 1 tablet (25 mg total) by mouth daily. 90 tablet 3  . potassium chloride SA (KLOR-CON M20) 20 MEQ tablet Take 20 mEq by mouth.     . pravastatin (PRAVACHOL) 40 MG tablet Take 40 mg by mouth daily.      . traMADol (ULTRAM) 50 MG tablet Take 50 mg by mouth every 6 (six) hours as needed for pain.      No current facility-administered medications for this visit.    Allergies:  Penicillins   Social History: The patient  reports that he quit smoking about 68 years ago. His smoking use included pipe and cigars. His smokeless tobacco use includes chew. He reports that he does not drink alcohol or use drugs.   ROS:  Please see the  history of present illness. Otherwise, complete review of systems is positive for hearing loss.  All other systems are reviewed and negative.   Physical Exam: VS:  BP 124/60   Pulse 96   Ht 5\' 11"  (1.803 m)   Wt 219 lb (99.3 kg)   SpO2 96%   BMI 30.54 kg/m , BMI Body mass index is 30.54 kg/m.  Wt Readings from Last 3 Encounters:  08/08/17 219 lb (99.3 kg)  05/07/17 215 lb (97.5 kg)  04/14/17 193 lb (87.5 kg)    General: Elderly male, appears comfortable at rest. HEENT: Conjunctiva and lids normal, oropharynx clear. Neck: Supple, no elevated JVP or carotid bruits, no thyromegaly. Lungs: Clear to auscultation, nonlabored breathing at rest. Cardiac: Regular rate and rhythm, no S3,  4-6/2 systolic murmur, no pericardial rub. Abdomen: Soft, nontender, bowel sounds present. Extremities: No pitting edema, distal pulses 2+. Skin: Warm and dry. Musculoskeletal: No kyphosis. Neuropsychiatric: Alert and oriented x3, affect grossly appropriate.  ECG: I personally reviewed the tracing from 02/05/2017 which shows sinus rhythm with prolonged PR interval.  Recent Labwork:  May 2018: Hgb 13.1, platelets 252, BUN 1.27, creatinine 1.27, AST 25, ALT 22, TSH 6.5, HbgA1c 7.3. Cholesterol 168, triglycerides 137, HDL 27, LDL 79  Other Studies Reviewed Today:  Echocardiogram 09/16/2016: Study Conclusions  - Left ventricle: The cavity size was normal. Wall thickness was increased in a pattern of moderate LVH. Systolic function was normal. The estimated ejection fraction was in the range of 60% to 65%. Wall motion was normal; there were no regional wall motion abnormalities. Doppler parameters are consistent with abnormal left ventricular relaxation (grade 1 diastolic dysfunction). Doppler parameters are consistent with high ventricular filling pressure. - Aortic valve: Moderately calcified annulus. Trileaflet; moderately thickened leaflets. There was moderate stenosis. There was mild regurgitation. Valve area (VTI): 1.25 cm^2. Valve area (Vmax): 1.14 cm^2. - Mitral valve: Mildly calcified annulus. Mildly thickened leaflets. - Left atrium: The atrium was mildly dilated. - Pulmonary arteries: Systolic pressure was mildly increased. PA peak pressure: 32 mm Hg (S). - Technically adequate study.  Exercise Myoview 02/13/2017:  There was no ST segment deviation noted during stress.  Defect 1: There is a medium defect of mild severity present in the basal inferoseptal, mid inferoseptal, apical septal and apical inferior location. While this is likely due to variable soft tissue attenuation artifact, a mild degree of ischemia cannot entirely be ruled out.  This is  a low risk study.  Nuclear stress EF: 72%.  Assessment and Plan:  1.  Exertional angina and lack of stamina.  This is in the setting of ischemic heart disease and aortic stenosis as well as advanced age.  Noninvasive testing from last year is reviewed above.  We have attempted medication adjustments and at this point will increase Toprol-XL from 12.5 mg daily to 25 mg daily.  We have discussed other possibilities for treatment, although at 84 years old these may be limited.  I have not pushed him for cardiac catheterization, but we have discussed the possibility of pursuing this to at least understand what other options may be possible as it relates to PCI or even potentially TAVR.  We will get a follow-up echocardiogram and have him come back to the office.  2.  Aortic stenosis, moderate by echocardiogram in April of last year.  This will be reassessed.  3.  Essential hypertension, blood pressure control is better today on current regimen.  4.  Type 2 diabetes mellitus, followed by Dr.  Hawkins.  Current medicines were reviewed with the patient today.   Orders Placed This Encounter  Procedures  . ECHOCARDIOGRAM COMPLETE    Disposition: Follow-up in 1 month.  Signed, Satira Sark, MD, [facility] 08/08/2017 11:34 AM    [place] at [facility] [address], [place], [place] [address] Phone: [phone]; Fax: [phone]

## 2017-08-08 ENCOUNTER — Encounter: Payer: Self-pay | Admitting: Cardiology

## 2017-08-08 ENCOUNTER — Ambulatory Visit: Payer: Medicare Other | Admitting: Cardiology

## 2017-08-08 VITALS — BP 124/60 | HR 96 | Ht 71.0 in | Wt 219.0 lb

## 2017-08-08 DIAGNOSIS — I10 Essential (primary) hypertension: Secondary | ICD-10-CM

## 2017-08-08 DIAGNOSIS — I350 Nonrheumatic aortic (valve) stenosis: Secondary | ICD-10-CM

## 2017-08-08 DIAGNOSIS — E118 Type 2 diabetes mellitus with unspecified complications: Secondary | ICD-10-CM

## 2017-08-08 DIAGNOSIS — I25119 Atherosclerotic heart disease of native coronary artery with unspecified angina pectoris: Secondary | ICD-10-CM

## 2017-08-08 MED ORDER — METOPROLOL SUCCINATE ER 25 MG PO TB24: 25.0000 mg | ORAL_TABLET | Freq: Every day | ORAL | 3 refills | Status: DC

## 2017-08-08 NOTE — Patient Instructions (Addendum)
Your physician wants you to follow-up in:1 month with Dr.McDowell     INCREASE Toprol to 25 mg daily    Your physician has requested that you have an echocardiogram. Echocardiography is a painless test that uses sound waves to create images of your heart. It provides your doctor with information about the size and shape of your heart and how well your heart's chambers and valves are working. This procedure takes approximately one hour. There are no restrictions for this procedure.      No lab work ordered today    If you need a refill on your cardiac medications before your next appointment, please call your pharmacy.    Thank you for choosing [facility] !

## 2017-08-15 ENCOUNTER — Ambulatory Visit (HOSPITAL_COMMUNITY)
Admission: RE | Admit: 2017-08-15 | Discharge: 2017-08-15 | Disposition: A | Discharge status: Home / Self Care | Payer: Medicare Other | Source: Ambulatory Visit | Attending: Cardiology | Admitting: Cardiology

## 2017-08-15 DIAGNOSIS — I2510 Atherosclerotic heart disease of native coronary artery without angina pectoris: Secondary | ICD-10-CM | POA: Diagnosis not present

## 2017-08-15 DIAGNOSIS — E119 Type 2 diabetes mellitus without complications: Secondary | ICD-10-CM | POA: Diagnosis not present

## 2017-08-15 DIAGNOSIS — I350 Nonrheumatic aortic (valve) stenosis: Secondary | ICD-10-CM | POA: Diagnosis not present

## 2017-08-15 DIAGNOSIS — I082 Rheumatic disorders of both aortic and tricuspid valves: Secondary | ICD-10-CM | POA: Diagnosis not present

## 2017-08-15 DIAGNOSIS — Z87891 Personal history of nicotine dependence: Secondary | ICD-10-CM | POA: Diagnosis not present

## 2017-08-15 DIAGNOSIS — I10 Essential (primary) hypertension: Secondary | ICD-10-CM | POA: Diagnosis not present

## 2017-08-15 DIAGNOSIS — E785 Hyperlipidemia, unspecified: Secondary | ICD-10-CM | POA: Insufficient documentation

## 2017-08-15 DIAGNOSIS — Z8673 Personal history of transient ischemic attack (TIA), and cerebral infarction without residual deficits: Secondary | ICD-10-CM | POA: Insufficient documentation

## 2017-08-15 NOTE — Progress Notes (Signed)
*  PRELIMINARY RESULTS* Echocardiogram 2D Echocardiogram has been performed.  Leavy Cella 08/15/2017, 11:33 AM

## 2017-09-22 ENCOUNTER — Other Ambulatory Visit: Payer: Self-pay

## 2017-09-22 MED ORDER — METOPROLOL SUCCINATE ER 25 MG PO TB24: 25.0000 mg | ORAL_TABLET | Freq: Every day | ORAL | 3 refills | Status: DC

## 2017-09-22 NOTE — Telephone Encounter (Signed)
escribed to [facility], metoprolol refill

## 2017-09-26 ENCOUNTER — Telehealth: Payer: Self-pay

## 2017-09-26 DIAGNOSIS — I6523 Occlusion and stenosis of bilateral carotid arteries: Secondary | ICD-10-CM

## 2017-09-26 NOTE — Telephone Encounter (Signed)
Order placed for carotid US, messaged front desk to schedule

## 2017-09-26 NOTE — Telephone Encounter (Signed)
-----   Message from Bernita Raisin, RN sent at 10/11/2016  9:26 AM EDT ----- Regarding: needs carotid end of april 2019 for sm Carotid US April 2019 for sm

## 2017-09-30 NOTE — H&P (View-Only) (Signed)
Cardiology Office Note  Date: 10/02/2017   ID: William Walker, DOB 07/04/1926, MRN [id number]  PCP: [name], MD  Primary Cardiologist: [name], MD   Chief Complaint  Patient presents with  . Coronary Artery Disease    History of Present Illness: William Walker is an 84 y.o. male last seen in March.  He presents with his daughter for a follow-up visit.  Still reports NYHA class III dyspnea, no no angina but decreased stamina.  We have tried medication adjustments, he ultimately stopped Imdur with concerns about a rash. At the last visit we increased Toprol-XL to 25 mg daily which he did tolerate.  Unfortunately, none of these changes have improved his symptoms.  Follow-up echocardiogram in March revealed LVEF 60 to 65% with grade 1 diastolic dysfunction, moderate to severe aortic stenosis with mean gradient 27 mmHg and dimensionless index 0.27.  We have discussed these results.  Today he talked with me about pursuing a cardiac catheterization to better understand his treatment options as it relates to both ischemic heart disease and aortic stenosis.  We had discussed this as a possibility previously, and he has had time to consider it further.  Past Medical History:  Diagnosis Date  . CHF (congestive heart failure) ([place])   . Coronary atherosclerosis of native coronary artery    Reportedly 2 stents - presumably LAD (records not available)  . CVD (cerebrovascular disease)   . Essential hypertension, benign   . GERD (gastroesophageal reflux disease)   . Hyperlipidemia   . MI (myocardial infarction) [facility])    October 1999  . Type 2 diabetes mellitus with diabetic neuropathy [facility])     Past Surgical History:  Procedure Laterality Date  . Arch cerebral ateriogram  12/11/2005   Rosetta Posner MD  . CATARACT EXTRACTION, BILATERAL    . INGUINAL HERNIA REPAIR Left 12/09/2012   Procedure: HERNIA REPAIR INGUINAL with mesh;  Surgeon: [name], MD;  Location: [facility];   Service: General;  Laterality: Left;  . INSERTION OF MESH Left 12/09/2012   Procedure: INSERTION OF MESH;  Surgeon: [name], MD;  Location: [facility];  Service: General;  Laterality: Left;    Current Outpatient Medications  Medication Sig Dispense Refill  . aspirin EC 325 MG tablet Take 325 mg by mouth daily.      . furosemide (LASIX) 40 MG tablet Take 40 mg by mouth daily as needed.     . lansoprazole (PREVACID) 15 MG capsule Take 15 mg by mouth daily.    . metoprolol succinate (TOPROL XL) 25 MG 24 hr tablet Take 1 tablet (25 mg total) by mouth daily. 90 tablet 3  . potassium chloride SA (KLOR-CON M20) 20 MEQ tablet Take 20 mEq by mouth.     . pravastatin (PRAVACHOL) 40 MG tablet Take 40 mg by mouth daily.       No current facility-administered medications for this visit.    Allergies:  Penicillins   Social History: The patient  reports that he quit smoking about 68 years ago. His smoking use included pipe and cigars. His smokeless tobacco use includes chew. He reports that he does not drink alcohol or use drugs.   Family History: The patient's family history includes Coronary artery disease in his father.   ROS:  Please see the history of present illness. Otherwise, complete review of systems is positive for hearing loss.  All other systems are reviewed and negative.   Physical Exam: VS:  BP (!) 154/80 (BP Location: Right Arm)   Pulse 84   Ht 6' (1.829 m)   Wt 216 lb (98 kg)   SpO2 97%   BMI 29.29 kg/m , BMI Body mass index is 29.29 kg/m.  Wt Readings from Last 3 Encounters:  10/02/17 216 lb (98 kg)  08/08/17 219 lb (99.3 kg)  05/07/17 215 lb (97.5 kg)    General: Elderly male, appears comfortable at rest. HEENT: Conjunctiva and lids normal, oropharynx clear. Neck: Supple, no elevated JVP or carotid bruits, no thyromegaly. Lungs: Clear to auscultation, nonlabored breathing at rest. Cardiac: Regular rate and rhythm, no S3, 3/6 systolic murmur, no pericardial  rub. Abdomen: Soft, nontender, bowel sounds present, no guarding or rebound. Extremities: No pitting edema, distal pulses 2+. Skin: Warm and dry. Musculoskeletal: No kyphosis. Neuropsychiatric: Alert and oriented x3, affect grossly appropriate.  ECG: I personally reviewed the tracing from  02/05/2017 which shows sinus rhythm with prolonged PR interval.  Recent Labwork:  May 2018: Hgb 13.1, platelets 252, creatinine 1.27, AST 25, ALT 22, TSH 6.5, HbgA1c 7.3. Cholesterol 168, triglycerides 137, HDL 27, LDL 79  Other Studies Reviewed Today:  Echocardiogram 08/15/2017: Study Conclusions  - Left ventricle: The cavity size was normal. Wall thickness was   increased in a pattern of moderate LVH. Systolic function was   normal. The estimated ejection fraction was in the range of 60%   to 65%. Wall motion was normal; there were no regional wall   motion abnormalities. Doppler parameters are consistent with   abnormal left ventricular relaxation (grade 1 diastolic   dysfunction). - Aortic valve: There was moderate to severe stenosis. There was   mild regurgitation. Mean gradient (S): 27 mm Hg. Peak gradient   (S): 42 mm Hg. VTI ratio of LVOT to aortic valve: 0.27. Valve   area (VTI): 0.95 cm^2. Valve area (Vmax): 0.99 cm^2. - Mitral valve: Mildly calcified annulus. There was trivial   regurgitation. - Left atrium: The atrium was mildly dilated. - Right atrium: Central venous pressure (est): 3 mm Hg. - Atrial septum: No defect or patent foramen ovale was identified. - Tricuspid valve: There was mild regurgitation. - Pulmonary arteries: PA peak pressure: 31 mm Hg (S). - Pericardium, extracardiac: There was no pericardial effusion.  Exercise Myoview 02/13/2017:  There was no ST segment deviation noted during stress.  Defect 1: There is a medium defect of mild severity present in the basal inferoseptal, mid inferoseptal, apical septal and apical inferior location. While this is likely due to  variable soft tissue attenuation artifact, a mild degree of ischemia cannot entirely be ruled out.  This is a low risk study.  Nuclear stress EF: 72%.  Assessment and Plan:  1.  Dyspnea on exertion with declining stamina.  We have attempted medical therapy for underlying ischemic heart disease based on history and also progressive aortic stenosis.  He is now at a point that he would like to consider further invasive testing, although his options at age 71 may be limited.  We have discussed the risks and benefits of a right and left heart catheterization for evaluation of coronary anatomy and further assessment of aortic stenosis for potential consideration of TAVR.  He is in agreement to proceed.  This will be scheduled with Dr. Burt Knack next week.  2.  Progressive aortic stenosis, moderate to severe range by recent echocardiogram.  3.  CAD with previous intervention (possibly 2 stents in LAD, records not available).  4.  Essential hypertension, no  further changes to medical regimen at this time.  He did not tolerate Imdur.  5.  Hyperlipidemia, on Pravachol.  Current medicines were reviewed with the patient today.   Orders Placed This Encounter  Procedures  . DG Chest 2 View  . CBC w/Diff/Platelet  . Basic Metabolic Panel (BMET)  . INR/PT    Disposition: Follow-up after procedure.  Signed, Satira Sark, MD, [facility] 10/02/2017 3:40 PM    [facility] at [facility] [address], [place], [place] [address] Phone: [phone]; Fax: [phone]

## 2017-09-30 NOTE — Progress Notes (Signed)
Cardiology Office Note  Date: 10/02/2017   ID: ARN MCOMBER, DOB 03/09/1927, MRN [id number]  PCP: [name], MD  Primary Cardiologist: [name], MD   Chief Complaint  Patient presents with  . Coronary Artery Disease    History of Present Illness: William Walker is an 84 y.o. male last seen in March.  He presents with his daughter for a follow-up visit.  Still reports NYHA class III dyspnea, no no angina but decreased stamina.  We have tried medication adjustments, he ultimately stopped Imdur with concerns about a rash. At the last visit we increased Toprol-XL to 25 mg daily which he did tolerate.  Unfortunately, none of these changes have improved his symptoms.  Follow-up echocardiogram in March revealed LVEF 60 to 65% with grade 1 diastolic dysfunction, moderate to severe aortic stenosis with mean gradient 27 mmHg and dimensionless index 0.27.  We have discussed these results.  Today he talked with me about pursuing a cardiac catheterization to better understand his treatment options as it relates to both ischemic heart disease and aortic stenosis.  We had discussed this as a possibility previously, and he has had time to consider it further.  Past Medical History:  Diagnosis Date  . CHF (congestive heart failure) ([place])   . Coronary atherosclerosis of native coronary artery    Reportedly 2 stents - presumably LAD (records not available)  . CVD (cerebrovascular disease)   . Essential hypertension, benign   . GERD (gastroesophageal reflux disease)   . Hyperlipidemia   . MI (myocardial infarction) [facility])    October 1999  . Type 2 diabetes mellitus with diabetic neuropathy [facility])     Past Surgical History:  Procedure Laterality Date  . Arch cerebral ateriogram  12/11/2005   Rosetta Posner MD  . CATARACT EXTRACTION, BILATERAL    . INGUINAL HERNIA REPAIR Left 12/09/2012   Procedure: HERNIA REPAIR INGUINAL with mesh;  Surgeon: [name], MD;  Location: [facility];   Service: General;  Laterality: Left;  . INSERTION OF MESH Left 12/09/2012   Procedure: INSERTION OF MESH;  Surgeon: [name], MD;  Location: [facility];  Service: General;  Laterality: Left;    Current Outpatient Medications  Medication Sig Dispense Refill  . aspirin EC 325 MG tablet Take 325 mg by mouth daily.      . furosemide (LASIX) 40 MG tablet Take 40 mg by mouth daily as needed.     . lansoprazole (PREVACID) 15 MG capsule Take 15 mg by mouth daily.    . metoprolol succinate (TOPROL XL) 25 MG 24 hr tablet Take 1 tablet (25 mg total) by mouth daily. 90 tablet 3  . potassium chloride SA (KLOR-CON M20) 20 MEQ tablet Take 20 mEq by mouth.     . pravastatin (PRAVACHOL) 40 MG tablet Take 40 mg by mouth daily.       No current facility-administered medications for this visit.    Allergies:  Penicillins   Social History: The patient  reports that he quit smoking about 68 years ago. His smoking use included pipe and cigars. His smokeless tobacco use includes chew. He reports that he does not drink alcohol or use drugs.   Family History: The patient's family history includes Coronary artery disease in his father.   ROS:  Please see the history of present illness. Otherwise, complete review of systems is positive for hearing loss.  All other systems are reviewed and negative.   Physical Exam: VS:  BP (!) 154/80 (BP Location: Right Arm)   Pulse 84   Ht 6' (1.829 m)   Wt 216 lb (98 kg)   SpO2 97%   BMI 29.29 kg/m , BMI Body mass index is 29.29 kg/m.  Wt Readings from Last 3 Encounters:  10/02/17 216 lb (98 kg)  08/08/17 219 lb (99.3 kg)  05/07/17 215 lb (97.5 kg)    General: Elderly male, appears comfortable at rest. HEENT: Conjunctiva and lids normal, oropharynx clear. Neck: Supple, no elevated JVP or carotid bruits, no thyromegaly. Lungs: Clear to auscultation, nonlabored breathing at rest. Cardiac: Regular rate and rhythm, no S3, 3/6 systolic murmur, no pericardial  rub. Abdomen: Soft, nontender, bowel sounds present, no guarding or rebound. Extremities: No pitting edema, distal pulses 2+. Skin: Warm and dry. Musculoskeletal: No kyphosis. Neuropsychiatric: Alert and oriented x3, affect grossly appropriate.  ECG: I personally reviewed the tracing from  02/05/2017 which shows sinus rhythm with prolonged PR interval.  Recent Labwork:  May 2018: Hgb 13.1, platelets 252, creatinine 1.27, AST 25, ALT 22, TSH 6.5, HbgA1c 7.3. Cholesterol 168, triglycerides 137, HDL 27, LDL 79  Other Studies Reviewed Today:  Echocardiogram 08/15/2017: Study Conclusions  - Left ventricle: The cavity size was normal. Wall thickness was   increased in a pattern of moderate LVH. Systolic function was   normal. The estimated ejection fraction was in the range of 60%   to 65%. Wall motion was normal; there were no regional wall   motion abnormalities. Doppler parameters are consistent with   abnormal left ventricular relaxation (grade 1 diastolic   dysfunction). - Aortic valve: There was moderate to severe stenosis. There was   mild regurgitation. Mean gradient (S): 27 mm Hg. Peak gradient   (S): 42 mm Hg. VTI ratio of LVOT to aortic valve: 0.27. Valve   area (VTI): 0.95 cm^2. Valve area (Vmax): 0.99 cm^2. - Mitral valve: Mildly calcified annulus. There was trivial   regurgitation. - Left atrium: The atrium was mildly dilated. - Right atrium: Central venous pressure (est): 3 mm Hg. - Atrial septum: No defect or patent foramen ovale was identified. - Tricuspid valve: There was mild regurgitation. - Pulmonary arteries: PA peak pressure: 31 mm Hg (S). - Pericardium, extracardiac: There was no pericardial effusion.  Exercise Myoview 02/13/2017:  There was no ST segment deviation noted during stress.  Defect 1: There is a medium defect of mild severity present in the basal inferoseptal, mid inferoseptal, apical septal and apical inferior location. While this is likely due to  variable soft tissue attenuation artifact, a mild degree of ischemia cannot entirely be ruled out.  This is a low risk study.  Nuclear stress EF: 72%.  Assessment and Plan:  1.  Dyspnea on exertion with declining stamina.  We have attempted medical therapy for underlying ischemic heart disease based on history and also progressive aortic stenosis.  He is now at a point that he would like to consider further invasive testing, although his options at age 42 may be limited.  We have discussed the risks and benefits of a right and left heart catheterization for evaluation of coronary anatomy and further assessment of aortic stenosis for potential consideration of TAVR.  He is in agreement to proceed.  This will be scheduled with Dr. Burt Knack next week.  2.  Progressive aortic stenosis, moderate to severe range by recent echocardiogram.  3.  CAD with previous intervention (possibly 2 stents in LAD, records not available).  4.  Essential hypertension, no  further changes to medical regimen at this time.  He did not tolerate Imdur.  5.  Hyperlipidemia, on Pravachol.  Current medicines were reviewed with the patient today.   Orders Placed This Encounter  Procedures  . DG Chest 2 View  . CBC w/Diff/Platelet  . Basic Metabolic Panel (BMET)  . INR/PT    Disposition: Follow-up after procedure.  Signed, Satira Sark, MD, [facility] 10/02/2017 3:40 PM    [facility] at [facility] [address] [place], [place] [address] Phone: [phone]; Fax: [phone]

## 2017-10-02 ENCOUNTER — Ambulatory Visit (HOSPITAL_COMMUNITY)
Admission: RE | Admit: 2017-10-02 | Discharge: 2017-10-02 | Disposition: A | Discharge status: Home / Self Care | Payer: Medicare Other | Source: Ambulatory Visit | Attending: Cardiology | Admitting: Cardiology

## 2017-10-02 ENCOUNTER — Other Ambulatory Visit: Payer: Self-pay | Admitting: Cardiology

## 2017-10-02 ENCOUNTER — Encounter: Payer: Self-pay | Admitting: Cardiology

## 2017-10-02 ENCOUNTER — Ambulatory Visit: Payer: Medicare Other | Admitting: Cardiology

## 2017-10-02 ENCOUNTER — Other Ambulatory Visit (HOSPITAL_COMMUNITY)
Admission: RE | Admit: 2017-10-02 | Discharge: 2017-10-02 | Disposition: A | Discharge status: Home / Self Care | Payer: Medicare Other | Source: Ambulatory Visit | Attending: Cardiology | Admitting: Cardiology

## 2017-10-02 VITALS — BP 154/80 | HR 84 | Ht 72.0 in | Wt 216.0 lb

## 2017-10-02 DIAGNOSIS — Z01818 Encounter for other preprocedural examination: Secondary | ICD-10-CM

## 2017-10-02 DIAGNOSIS — R0609 Other forms of dyspnea: Principal | ICD-10-CM

## 2017-10-02 DIAGNOSIS — R0600 Dyspnea, unspecified: Secondary | ICD-10-CM

## 2017-10-02 DIAGNOSIS — K449 Diaphragmatic hernia without obstruction or gangrene: Secondary | ICD-10-CM | POA: Insufficient documentation

## 2017-10-02 DIAGNOSIS — I25119 Atherosclerotic heart disease of native coronary artery with unspecified angina pectoris: Secondary | ICD-10-CM

## 2017-10-02 DIAGNOSIS — I10 Essential (primary) hypertension: Secondary | ICD-10-CM

## 2017-10-02 DIAGNOSIS — Z01812 Encounter for preprocedural laboratory examination: Secondary | ICD-10-CM | POA: Diagnosis not present

## 2017-10-02 DIAGNOSIS — E782 Mixed hyperlipidemia: Secondary | ICD-10-CM

## 2017-10-02 DIAGNOSIS — I350 Nonrheumatic aortic (valve) stenosis: Secondary | ICD-10-CM | POA: Insufficient documentation

## 2017-10-02 LAB — PROTIME-INR
INR: 1.02
PROTHROMBIN TIME: 13.3 s (ref 11.4–15.2)

## 2017-10-02 LAB — CBC WITH DIFFERENTIAL/PLATELET
BASOS PCT: 0 %
Basophils Absolute: 0 10*3/uL (ref 0.0–0.1)
EOS ABS: 0.3 10*3/uL (ref 0.0–0.7)
Eosinophils Relative: 4 %
HCT: 27.6 % — ABNORMAL LOW (ref 39.0–52.0)
Hemoglobin: 8.3 g/dL — ABNORMAL LOW (ref 13.0–17.0)
Lymphocytes Relative: 18 %
Lymphs Abs: 1.4 10*3/uL (ref 0.7–4.0)
MCH: 23.4 pg — ABNORMAL LOW (ref 26.0–34.0)
MCHC: 30.1 g/dL (ref 30.0–36.0)
MCV: 78 fL (ref 78.0–100.0)
MONO ABS: 0.8 10*3/uL (ref 0.1–1.0)
MONOS PCT: 10 %
Neutro Abs: 5.4 10*3/uL (ref 1.7–7.7)
Neutrophils Relative %: 68 %
Platelets: 252 10*3/uL (ref 150–400)
RBC: 3.54 MIL/uL — ABNORMAL LOW (ref 4.22–5.81)
RDW: 17.8 % — AB (ref 11.5–15.5)
WBC: 8 10*3/uL (ref 4.0–10.5)

## 2017-10-02 LAB — BASIC METABOLIC PANEL
Anion gap: 11 (ref 5–15)
BUN: 22 mg/dL — ABNORMAL HIGH (ref 6–20)
CALCIUM: 9.3 mg/dL (ref 8.9–10.3)
CO2: 24 mmol/L (ref 22–32)
CREATININE: 1.13 mg/dL (ref 0.61–1.24)
Chloride: 102 mmol/L (ref 101–111)
GFR calc non Af Amer: 56 mL/min — ABNORMAL LOW (ref >60)
GLUCOSE: 179 mg/dL — AB (ref 65–99)
Potassium: 4.2 mmol/L (ref 3.5–5.1)
Sodium: 137 mmol/L (ref 135–145)

## 2017-10-02 NOTE — Patient Instructions (Addendum)
   [place] [address] Dept: [phone] Loc: [phone]  William Walker  10/02/2017  You are scheduled for a Cardiac Catheterization on Tuesday, May 7 with Dr. [name].  1. Please arrive at the [facility] (Main Entrance A) at [facility]: [address], [place] [address] at 10:00 AM (two hours before your procedure to ensure your preparation). Free valet parking service is available.   Special note: Every effort is made to have your procedure done on time. Please understand that emergencies sometimes delay scheduled procedures.  2. Diet: Do not eat or drink anything after midnight prior to your procedure except sips of water to take medications.  3. Labs get lab work today and Chest x-ray  4. Medication instructions in preparation for your procedure:  Take all medications as usual  EXCEPT : HOLD LASIX THE MORNING OF TEST   On the morning of your procedure, take your Aspirin 81 MG and any morning medicines NOT listed above.  You may use sips of water.  5. Plan for one night stay--bring personal belongings. 6. Bring a current list of your medications and current insurance cards. 7. You MUST have a responsible person to drive you home. 8. Someone MUST be with you the first 24 hours after you arrive home or your discharge will be delayed. 9. Please wear clothes that are easy to get on and off and wear slip-on shoes.  Thank you for allowing [place] to care for you!   -- [place]

## 2017-10-07 ENCOUNTER — Encounter (HOSPITAL_COMMUNITY)
Admission: RE | Disposition: A | Discharge status: Home / Self Care | Payer: Self-pay | Source: Ambulatory Visit | Attending: Cardiovascular Disease

## 2017-10-07 ENCOUNTER — Ambulatory Visit (HOSPITAL_COMMUNITY)
Admission: RE | Admit: 2017-10-07 | Discharge: 2017-10-07 | Disposition: A | Discharge status: Home / Self Care | Payer: Medicare Other | Source: Ambulatory Visit | Attending: Cardiovascular Disease | Admitting: Cardiovascular Disease

## 2017-10-07 DIAGNOSIS — R0600 Dyspnea, unspecified: Secondary | ICD-10-CM

## 2017-10-07 DIAGNOSIS — E785 Hyperlipidemia, unspecified: Secondary | ICD-10-CM | POA: Insufficient documentation

## 2017-10-07 DIAGNOSIS — I110 Hypertensive heart disease with heart failure: Secondary | ICD-10-CM | POA: Diagnosis not present

## 2017-10-07 DIAGNOSIS — I252 Old myocardial infarction: Secondary | ICD-10-CM | POA: Insufficient documentation

## 2017-10-07 DIAGNOSIS — Z955 Presence of coronary angioplasty implant and graft: Secondary | ICD-10-CM | POA: Diagnosis not present

## 2017-10-07 DIAGNOSIS — Z79899 Other long term (current) drug therapy: Secondary | ICD-10-CM | POA: Diagnosis not present

## 2017-10-07 DIAGNOSIS — Z8249 Family history of ischemic heart disease and other diseases of the circulatory system: Secondary | ICD-10-CM | POA: Diagnosis not present

## 2017-10-07 DIAGNOSIS — Z720 Tobacco use: Secondary | ICD-10-CM | POA: Insufficient documentation

## 2017-10-07 DIAGNOSIS — I509 Heart failure, unspecified: Secondary | ICD-10-CM | POA: Diagnosis not present

## 2017-10-07 DIAGNOSIS — D649 Anemia, unspecified: Secondary | ICD-10-CM | POA: Insufficient documentation

## 2017-10-07 DIAGNOSIS — I25118 Atherosclerotic heart disease of native coronary artery with other forms of angina pectoris: Secondary | ICD-10-CM

## 2017-10-07 DIAGNOSIS — E1140 Type 2 diabetes mellitus with diabetic neuropathy, unspecified: Secondary | ICD-10-CM | POA: Diagnosis not present

## 2017-10-07 DIAGNOSIS — Z7982 Long term (current) use of aspirin: Secondary | ICD-10-CM | POA: Insufficient documentation

## 2017-10-07 DIAGNOSIS — R0609 Other forms of dyspnea: Secondary | ICD-10-CM

## 2017-10-07 DIAGNOSIS — I350 Nonrheumatic aortic (valve) stenosis: Secondary | ICD-10-CM | POA: Diagnosis not present

## 2017-10-07 DIAGNOSIS — I208 Other forms of angina pectoris: Secondary | ICD-10-CM | POA: Diagnosis present

## 2017-10-07 DIAGNOSIS — K219 Gastro-esophageal reflux disease without esophagitis: Secondary | ICD-10-CM | POA: Diagnosis not present

## 2017-10-07 HISTORY — PX: RIGHT/LEFT HEART CATH AND CORONARY ANGIOGRAPHY: CATH118266

## 2017-10-07 LAB — CBC
HEMATOCRIT: 26.3 % — AB (ref 39.0–52.0)
Hemoglobin: 7.7 g/dL — ABNORMAL LOW (ref 13.0–17.0)
MCH: 23 pg — ABNORMAL LOW (ref 26.0–34.0)
MCHC: 29.3 g/dL — ABNORMAL LOW (ref 30.0–36.0)
MCV: 78.5 fL (ref 78.0–100.0)
PLATELETS: 230 10*3/uL (ref 150–400)
RBC: 3.35 MIL/uL — AB (ref 4.22–5.81)
RDW: 18.4 % — ABNORMAL HIGH (ref 11.5–15.5)
WBC: 5 10*3/uL (ref 4.0–10.5)

## 2017-10-07 LAB — POCT I-STAT 3, VENOUS BLOOD GAS (G3P V)
Acid-base deficit: 2 mmol/L (ref 0.0–2.0)
Bicarbonate: 23.6 mmol/L (ref 20.0–28.0)
O2 SAT: 67 %
PCO2 VEN: 41.8 mmHg — AB (ref 44.0–60.0)
PO2 VEN: 36 mmHg (ref 32.0–45.0)
TCO2: 25 mmol/L (ref 22–32)
pH, Ven: 7.361 (ref 7.250–7.430)

## 2017-10-07 LAB — POCT I-STAT 3, ART BLOOD GAS (G3+)
Acid-base deficit: 2 mmol/L (ref 0.0–2.0)
BICARBONATE: 23.1 mmol/L (ref 20.0–28.0)
O2 Saturation: 98 %
PH ART: 7.392 (ref 7.350–7.450)
TCO2: 24 mmol/L (ref 22–32)
pCO2 arterial: 38 mmHg (ref 32.0–48.0)
pO2, Arterial: 105 mmHg (ref 83.0–108.0)

## 2017-10-07 LAB — GLUCOSE, CAPILLARY: Glucose-Capillary: 162 mg/dL — ABNORMAL HIGH (ref 65–99)

## 2017-10-07 SURGERY — RIGHT/LEFT HEART CATH AND CORONARY ANGIOGRAPHY
Anesthesia: LOCAL

## 2017-10-07 MED ORDER — ACETAMINOPHEN 325 MG PO TABS: 650.0000 mg | ORAL_TABLET | ORAL | Status: DC | PRN

## 2017-10-07 MED ORDER — LIDOCAINE HCL (PF) 1 % IJ SOLN
INTRAMUSCULAR | Status: AC
  Filled 2017-10-07: qty 30

## 2017-10-07 MED ORDER — SODIUM CHLORIDE 0.9% FLUSH: 3.0000 mL | Freq: Two times a day (BID) | INTRAVENOUS | Status: DC

## 2017-10-07 MED ORDER — VERAPAMIL HCL 2.5 MG/ML IV SOLN
INTRAVENOUS | Status: AC
  Filled 2017-10-07: qty 2

## 2017-10-07 MED ORDER — IOHEXOL 350 MG/ML SOLN
INTRAVENOUS | Status: DC | PRN
  Administered 2017-10-07: 90 mL via INTRAVENOUS

## 2017-10-07 MED ORDER — METOPROLOL SUCCINATE ER 25 MG PO TB24: 25.0000 mg | ORAL_TABLET | Freq: Every day | ORAL | Status: DC

## 2017-10-07 MED ORDER — FENTANYL CITRATE (PF) 100 MCG/2ML IJ SOLN
INTRAMUSCULAR | Status: AC
  Filled 2017-10-07: qty 2

## 2017-10-07 MED ORDER — VERAPAMIL HCL 2.5 MG/ML IV SOLN
INTRAVENOUS | Status: DC | PRN
  Administered 2017-10-07: 10 mL via INTRA_ARTERIAL

## 2017-10-07 MED ORDER — ASPIRIN 81 MG PO CHEW: 81.0000 mg | CHEWABLE_TABLET | ORAL | Status: DC

## 2017-10-07 MED ORDER — MIDAZOLAM HCL 2 MG/2ML IJ SOLN
INTRAMUSCULAR | Status: AC
  Filled 2017-10-07: qty 2

## 2017-10-07 MED ORDER — SODIUM CHLORIDE 0.9 % WEIGHT BASED INFUSION: 1.0000 mL/kg/h | INTRAVENOUS | Status: DC

## 2017-10-07 MED ORDER — MIDAZOLAM HCL 2 MG/2ML IJ SOLN
INTRAMUSCULAR | Status: DC | PRN
  Administered 2017-10-07: 1 mg via INTRAVENOUS

## 2017-10-07 MED ORDER — SODIUM CHLORIDE 0.9 % WEIGHT BASED INFUSION
3.0000 mL/kg/h | INTRAVENOUS | Status: AC
  Administered 2017-10-07: 3 mL/kg/h via INTRAVENOUS

## 2017-10-07 MED ORDER — SODIUM CHLORIDE 0.9% FLUSH: 3.0000 mL | INTRAVENOUS | Status: DC | PRN

## 2017-10-07 MED ORDER — ASPIRIN EC 81 MG PO TBEC: 81.0000 mg | DELAYED_RELEASE_TABLET | Freq: Every day | ORAL | 2 refills | Status: AC

## 2017-10-07 MED ORDER — LIDOCAINE HCL (PF) 1 % IJ SOLN
INTRAMUSCULAR | Status: DC | PRN
  Administered 2017-10-07: 2 mL

## 2017-10-07 MED ORDER — HEPARIN (PORCINE) IN NACL 1000-0.9 UT/500ML-% IV SOLN
INTRAVENOUS | Status: AC
  Filled 2017-10-07: qty 1000

## 2017-10-07 MED ORDER — FENTANYL CITRATE (PF) 100 MCG/2ML IJ SOLN
INTRAMUSCULAR | Status: DC | PRN
  Administered 2017-10-07: 25 ug via INTRAVENOUS

## 2017-10-07 MED ORDER — ONDANSETRON HCL 4 MG/2ML IJ SOLN: 4.0000 mg | Freq: Four times a day (QID) | INTRAMUSCULAR | Status: DC | PRN

## 2017-10-07 MED ORDER — SODIUM CHLORIDE 0.9 % IV SOLN: 250.0000 mL | INTRAVENOUS | Status: DC | PRN

## 2017-10-07 MED ORDER — HEPARIN SODIUM (PORCINE) 1000 UNIT/ML IJ SOLN
INTRAMUSCULAR | Status: AC
  Filled 2017-10-07: qty 1

## 2017-10-07 MED ORDER — HEPARIN (PORCINE) IN NACL 2-0.9 UNITS/ML
INTRAMUSCULAR | Status: AC | PRN
  Administered 2017-10-07 (×2): 500 mL

## 2017-10-07 MED ORDER — HEPARIN SODIUM (PORCINE) 1000 UNIT/ML IJ SOLN
INTRAMUSCULAR | Status: DC | PRN
  Administered 2017-10-07: 5000 [IU] via INTRAVENOUS

## 2017-10-07 SURGICAL SUPPLY — 14 items
BAND CMPR LRG ZPHR (HEMOSTASIS) ×1
BAND ZEPHYR COMPRESS 30 LONG (HEMOSTASIS) ×2 IMPLANT
CATH BALLN WEDGE 5F 110CM (CATHETERS) ×2 IMPLANT
CATH INFINITI 5 FR JL3.5 (CATHETERS) ×2 IMPLANT
CATH INFINITI JR4 5F (CATHETERS) ×2 IMPLANT
CATH LAUNCHER 5F EBU3.5 (CATHETERS) ×2 IMPLANT
GUIDEWIRE INQWIRE 1.5J.035X260 (WIRE) ×1 IMPLANT
INQWIRE 1.5J .035X260CM (WIRE) ×2
KIT HEART LEFT (KITS) ×2 IMPLANT
PACK CARDIAC CATHETERIZATION (CUSTOM PROCEDURE TRAY) ×2 IMPLANT
SHEATH GLIDE SLENDER 4/5FR (SHEATH) ×2 IMPLANT
SHEATH RAIN RADIAL 21G 6FR (SHEATH) ×2 IMPLANT
TRANSDUCER W/STOPCOCK (MISCELLANEOUS) ×2 IMPLANT
TUBING CIL FLEX 10 FLL-RA (TUBING) ×2 IMPLANT

## 2017-10-07 NOTE — Discharge Instructions (Signed)

## 2017-10-07 NOTE — Interval H&P Note (Signed)
History and Physical Interval Note:  10/07/2017 12:43 PM  William Walker  has presented today for surgery, with the diagnosis of cad - as  The various methods of treatment have been discussed with the patient and family. After consideration of risks, benefits and other options for treatment, the patient has consented to  Procedure(s): RIGHT/LEFT HEART CATH AND CORONARY ANGIOGRAPHY (N/A) as a surgical intervention .  The patient's history has been reviewed, patient examined, no change in status, stable for surgery.  I have reviewed the patient's chart and labs.  Questions were answered to the patient's satisfaction.    Labs reviewed. Long d/w patient and family. HgB 7.7 down from baseline approximately 13. Pt with exertional angina. Denies melena, maroon stools, or hematochezia. Reviewed options. I think reasonable to proceed with diagnostic only cath (R/L) to evaluate coronary anatomy. Likely will need PRBC transfusion and further evaluation for symptomatic anemia once we better understand his cardiac status.   [name]

## 2017-10-07 NOTE — Progress Notes (Signed)
Notified Dr. Burt Knack of Hgb 7.7.  No new orders

## 2017-10-08 ENCOUNTER — Encounter (HOSPITAL_COMMUNITY): Payer: Self-pay | Admitting: Cardiovascular Disease

## 2017-10-08 ENCOUNTER — Encounter (HOSPITAL_COMMUNITY)
Admission: RE | Admit: 2017-10-08 | Discharge: 2017-10-08 | Disposition: A | Discharge status: Home / Self Care | Payer: Medicare Other | Source: Ambulatory Visit | Attending: Pulmonary Disease | Admitting: Pulmonary Disease

## 2017-10-08 DIAGNOSIS — D649 Anemia, unspecified: Secondary | ICD-10-CM | POA: Insufficient documentation

## 2017-10-08 LAB — ABO/RH: ABO/RH(D): A POS

## 2017-10-08 LAB — VITAMIN B12: VITAMIN B 12: 234 pg/mL (ref 180–914)

## 2017-10-08 LAB — IRON AND TIBC
IRON: 22 ug/dL — AB (ref 45–182)
SATURATION RATIOS: 5 % — AB (ref 17.9–39.5)
TIBC: 479 ug/dL — AB (ref 250–450)
UIBC: 457 ug/dL

## 2017-10-08 LAB — FERRITIN: FERRITIN: 14 ng/mL — AB (ref 24–336)

## 2017-10-08 LAB — HEMOGLOBIN AND HEMATOCRIT, BLOOD
HCT: 28.4 % — ABNORMAL LOW (ref 39.0–52.0)
HEMOGLOBIN: 8.2 g/dL — AB (ref 13.0–17.0)

## 2017-10-08 LAB — PREPARE RBC (CROSSMATCH)

## 2017-10-08 MED FILL — Heparin Sod (Porcine)-NaCl IV Soln 1000 Unit/500ML-0.9%: INTRAVENOUS | Qty: 1000 | Status: AC

## 2017-10-08 NOTE — Progress Notes (Signed)
Here for blood to be drawn for blood transfusion on 10/10/17. Consent signed. Blood drawn and sent to lab for results. Son at side.

## 2017-10-09 LAB — FOLATE RBC
FOLATE, RBC: 1638 ng/mL (ref >498)
Folate, Hemolysate: 448.9 ng/mL
HEMATOCRIT: 27.4 % — AB (ref 37.5–51.0)

## 2017-10-10 ENCOUNTER — Encounter (HOSPITAL_COMMUNITY): Payer: Self-pay

## 2017-10-10 ENCOUNTER — Encounter (HOSPITAL_COMMUNITY)
Admission: RE | Admit: 2017-10-10 | Discharge: 2017-10-10 | Disposition: A | Discharge status: Home / Self Care | Payer: Medicare Other | Source: Ambulatory Visit | Attending: Pulmonary Disease | Admitting: Pulmonary Disease

## 2017-10-10 DIAGNOSIS — D649 Anemia, unspecified: Secondary | ICD-10-CM | POA: Diagnosis not present

## 2017-10-10 MED ORDER — SODIUM CHLORIDE 0.9 % IV SOLN: Freq: Once | INTRAVENOUS | Status: DC

## 2017-10-11 LAB — TYPE AND SCREEN
ABO/RH(D): A POS
Antibody Screen: NEGATIVE
Unit division: 0
Unit division: 0

## 2017-10-11 LAB — BPAM RBC
BLOOD PRODUCT EXPIRATION DATE: 201905292359
Blood Product Expiration Date: 201905272359
ISSUE DATE / TIME: 201905100943
ISSUE DATE / TIME: 201905100814
Unit Type and Rh: 5100
Unit Type and Rh: 5100

## 2017-10-14 NOTE — Progress Notes (Signed)
Results for JEMAL, MISKELL (MRN [id number]) as of 10/14/2017 10:18  Ref. Range 10/08/2017 13:50 10/08/2017 13:50 10/08/2017 13:53 10/08/2017 14:30  Iron Latest Ref Range: 45 - 182 ug/dL   22 (L)   UIBC Latest Units: ug/dL   457   TIBC Latest Ref Range: 250 - 450 ug/dL   479 (H)   Saturation Ratios Latest Ref Range: 17.9 - 39.5 %   5 (L)   Ferritin Latest Ref Range: 24 - 336 ng/mL   14 (L)   Folate, Hemolysate Latest Ref Range: Not Estab. ng/mL    448.9  Folate, RBC Latest Ref Range: >498 ng/mL    1,638  Vitamin B12 Latest Ref Range: 180 - 914 pg/mL   234   Hemoglobin Latest Ref Range: 13.0 - 17.0 g/dL 8.2 (L)     HCT Latest Ref Range: 37.5 - 51.0 % 28.4 (L)   27.4 (L)  Sample Expiration Unknown 10/11/2017     Antibody Screen Unknown NEG     ABO/RH(D) Unknown A POS   A POS...  Unit Number Unknown [id number]    Blood Component Type Unknown RED CELLS,LR RBC LR PHER1    Unit division Unknown 00 00    Status of Unit Unknown ISSUED,FINAL ISSUED,FINAL    Transfusion Status Unknown OK TO TRANSFUSE OK TO TRANSFUSE    Crossmatch Result Unknown Compatible Compatible...    Order Confirmation Unknown ORDER PROCESSED B.Marland KitchenMarland Kitchen

## 2017-10-17 ENCOUNTER — Ambulatory Visit (HOSPITAL_COMMUNITY)
Admission: RE | Admit: 2017-10-17 | Discharge: 2017-10-17 | Disposition: A | Discharge status: Home / Self Care | Payer: Medicare Other | Source: Ambulatory Visit | Attending: Cardiology | Admitting: Cardiology

## 2017-10-17 DIAGNOSIS — I6523 Occlusion and stenosis of bilateral carotid arteries: Secondary | ICD-10-CM

## 2017-10-17 DIAGNOSIS — I6521 Occlusion and stenosis of right carotid artery: Secondary | ICD-10-CM | POA: Diagnosis not present

## 2017-10-20 ENCOUNTER — Telehealth: Payer: Self-pay

## 2017-10-20 DIAGNOSIS — I6523 Occlusion and stenosis of bilateral carotid arteries: Secondary | ICD-10-CM

## 2017-10-20 NOTE — Telephone Encounter (Signed)
-----   Message from Satira Sark, MD sent at 10/19/2017  2:17 PM EDT ----- Results reviewed. He should have a consultation with [facility] regarding progressive carotid artery disease. A copy of this test should be forwarded to [name], MD.

## 2017-10-20 NOTE — Telephone Encounter (Signed)
Daughter Sofie Rower informed.

## 2017-10-20 NOTE — Telephone Encounter (Signed)
Referral place to [facility] [place], I spoke with wife, Darcus Pester and they will expect call from [facility]

## 2017-10-28 ENCOUNTER — Encounter: Payer: Self-pay | Admitting: Internal Medicine

## 2017-10-28 DIAGNOSIS — E1169 Type 2 diabetes mellitus with other specified complication: Secondary | ICD-10-CM | POA: Diagnosis not present

## 2017-10-28 DIAGNOSIS — D649 Anemia, unspecified: Secondary | ICD-10-CM | POA: Diagnosis not present

## 2017-10-28 DIAGNOSIS — I10 Essential (primary) hypertension: Secondary | ICD-10-CM | POA: Diagnosis not present

## 2017-10-28 DIAGNOSIS — I25119 Atherosclerotic heart disease of native coronary artery with unspecified angina pectoris: Secondary | ICD-10-CM | POA: Diagnosis not present

## 2017-11-13 NOTE — Progress Notes (Signed)
Cardiology Office Note  Date: 11/14/2017   ID: William Walker, DOB 1927/05/27, MRN [id number]  PCP: [name], MD  Primary Cardiologist: [name], MD   Chief Complaint  Patient presents with  . Coronary Artery Disease  . Aortic Stenosis    History of Present Illness: William Walker is an 84 y.o. male last seen in May.  He is here today with his daughter for a follow-up visit. He underwent left and right heart catheterization with Dr. Burt Knack in May with findings of moderate diffuse CAD, continued patency of stent site within the LAD, and overall moderate aortic stenosis.  Medical therapy was recommended.  He was also noted to be anemic with follow-up planned with PCP.  He did see Dr. Luan Pulling and had 2 units of PRBCs, follow-up hemoglobin 10.5.  He has feels somewhat better.  Apparently he had negative stool cards.  Follow-up carotid Dopplers showed significant bilateral ICA disease as detailed below, he has pending assessment with [facility], sees Dr. Donnetta Hutching soon.  He is asymptomatic and is on antiplatelet regimen as well as statin.  Today we discussed plan to continue medical therapy and observation for now from a cardiac perspective.  Past Medical History:  Diagnosis Date  . CHF (congestive heart failure) ([place])   . Coronary atherosclerosis of native coronary artery    Reportedly 2 stents - presumably LAD (records not available)  . CVD (cerebrovascular disease)   . Essential hypertension, benign   . GERD (gastroesophageal reflux disease)   . Hyperlipidemia   . MI (myocardial infarction) [facility])    October 1999  . Type 2 diabetes mellitus with diabetic neuropathy [facility])     Past Surgical History:  Procedure Laterality Date  . Arch cerebral ateriogram  12/11/2005   Rosetta Posner MD  . CATARACT EXTRACTION, BILATERAL    . INGUINAL HERNIA REPAIR Left 12/09/2012   Procedure: HERNIA REPAIR INGUINAL with mesh;  Surgeon: [name], MD;  Location: [facility];  Service: General;   Laterality: Left;  . INSERTION OF MESH Left 12/09/2012   Procedure: INSERTION OF MESH;  Surgeon: [name], MD;  Location: [facility];  Service: General;  Laterality: Left;  . RIGHT/LEFT HEART CATH AND CORONARY ANGIOGRAPHY N/A 10/07/2017   Procedure: RIGHT/LEFT HEART CATH AND CORONARY ANGIOGRAPHY;  Surgeon: [name], MD;  Location: [facility];  Service: Cardiovascular;  Laterality: N/A;    Current Outpatient Medications  Medication Sig Dispense Refill  . Ascorbic Acid (VITAMIN C PO) Take 1 tablet by mouth daily.    Marland Kitchen aspirin EC 81 MG tablet Take 1 tablet (81 mg total) by mouth daily. 150 tablet 2  . furosemide (LASIX) 40 MG tablet Take 40 mg by mouth daily as needed for fluid.     Marland Kitchen lansoprazole (PREVACID) 15 MG capsule Take 15 mg by mouth daily with supper.     . metoprolol succinate (TOPROL XL) 25 MG 24 hr tablet Take 1 tablet (25 mg total) by mouth daily. 90 tablet 3  . potassium chloride SA (KLOR-CON M20) 20 MEQ tablet Take 20 mEq by mouth daily as needed (with Lasix).     . pravastatin (PRAVACHOL) 40 MG tablet Take 40 mg by mouth at bedtime.     Marland Kitchen lisinopril (PRINIVIL,ZESTRIL) 10 MG tablet Take 1 tablet (10 mg total) by mouth daily. 90 tablet 3   No current facility-administered medications for this visit.    Allergies:  Penicillins   Social History: The patient  reports that he quit smoking about 68 years ago. His smoking use included pipe and cigars. His smokeless tobacco use includes chew. He reports that he does not drink alcohol or use drugs.   ROS:  Please see the history of present illness. Otherwise, complete review of systems is positive for chronic rash, arthritic changes, hearing loss.  All other systems are reviewed and negative.   Physical Exam: VS:  BP (!) 142/78   Pulse 84   Ht 5\' 11"  (1.803 m)   Wt 217 lb (98.4 kg)   SpO2 98%   BMI 30.27 kg/m , BMI Body mass index is 30.27 kg/m.  Wt Readings from Last 3 Encounters:  11/14/17 217 lb (98.4 kg)    10/10/17 216 lb (98 kg)  10/07/17 216 lb (98 kg)    General: Elderly male, appears comfortable at rest. HEENT: Conjunctiva and lids normal, oropharynx clear. Neck: Supple, no elevated JVP or carotid bruits, no thyromegaly. Lungs: Clear to auscultation, nonlabored breathing at rest. Cardiac: Regular rate and rhythm, no S3, 3/6 systolic murmur. Abdomen: Soft, nontender, bowel sounds present. Extremities: No pitting edema, distal pulses 2+. Skin: Warm and dry. Musculoskeletal: No kyphosis. Neuropsychiatric: Alert and oriented x3, affect grossly appropriate.  ECG: I personally reviewed the tracing from 10/07/2017 which showed sinus rhythm with prolonged PR interval and LVH.  Recent Labwork: 10/02/2017: BUN 22; Creatinine, Ser 1.13; Potassium 4.2; Sodium 137 10/07/2017: Platelets 230 10/08/2017: Hemoglobin 8.2   Other Studies Reviewed Today:  Carotid Dopplers 10/17/2017: IMPRESSION: Right:  Heterogeneous and partially calcified plaque at the right carotid bifurcation, with 70%-99% stenosis by established duplex criteria. This may be overestimated given the contralateral occlusion, and if there is concern for more precise assessment, formal cerebral angiogram may be considered.  Left:  Chronic occlusion of the left internal carotid artery.  There appears to be new occlusion of the left vertebral artery, which was patent in 2009  Cardiac catheterization 10/07/2017: 1. Moderate diffuse coronary artery disease with moderate left mainstem stenosis, moderate stenosis of the proximal circumflex, mild nonobstructive stenosis of the LAD and RCA, and moderately severe stenosis of the diagonal origin 2.  Continued patency of the stented segment in the LAD 3.  Moderate aortic stenosis with a mean transvalvular gradient of 26 mmHg  Recommendations: All available data is reviewed.  The patient really does not have any targets for PCI.  I suspect a combination of moderate aortic stenosis,  moderate coronary artery disease, and significant anemia accounts in combination for his symptoms.  His hemoglobin is approximately 8 mg/dL which is down from a normal hemoglobin of 13 mg/dL last year.  Advised follow-up with his primary care physician for consideration of elective blood transfusion and further work-up.  I think his cardiac disease can be followed with medical therapy and serial echo follow-up.  Findings discussed with Dr. Domenic Polite.  Note if the patient requires repeat catheterization, I would not recommend using the right radial as an access site because of severe right subclavian tortuosity.  Assessment and Plan:  1.  Moderate multivessel CAD with patent LAD stent site by recent cardiac catheterization and plan for medical therapy.  Continue aspirin and Pravachol.  2.  Essential hypertension, patient states blood pressure has started to trend back up.  We are resuming lisinopril at 10 mg daily which he had taken in the past.  3.  Iron deficiency anemia, being followed by Dr. Luan Pulling at this point.  He is status post PRBCs with hemoglobin 10.5, stool cards  negative.  4.  Severe bilateral ICA disease with chronically occluded LICA and progression in RICA disease to severe range.  He has been referred to Dr. Donnetta Hutching for vascular evaluation.  To new aspirin and statin.  5.  Aortic stenosis, moderate by recent cardiac catheterization, moderate to severe by echocardiogram.  Continue to follow at this time, he may ultimately be a candidate for TAVR.  Current medicines were reviewed with the patient today.  Disposition: Follow-up in 3 months.  Signed, Satira Sark, MD, [facility] 11/14/2017 11:18 AM    [facility] at [facility] [address], [place], [place] [address] Phone: [phone]; Fax: [phone]

## 2017-11-14 ENCOUNTER — Encounter: Payer: Self-pay | Admitting: Cardiology

## 2017-11-14 ENCOUNTER — Ambulatory Visit: Payer: Medicare Other | Admitting: Cardiology

## 2017-11-14 VITALS — BP 142/78 | HR 84 | Ht 71.0 in | Wt 217.0 lb

## 2017-11-14 DIAGNOSIS — I6523 Occlusion and stenosis of bilateral carotid arteries: Secondary | ICD-10-CM | POA: Diagnosis not present

## 2017-11-14 DIAGNOSIS — I350 Nonrheumatic aortic (valve) stenosis: Secondary | ICD-10-CM

## 2017-11-14 DIAGNOSIS — I10 Essential (primary) hypertension: Secondary | ICD-10-CM | POA: Diagnosis not present

## 2017-11-14 DIAGNOSIS — I25119 Atherosclerotic heart disease of native coronary artery with unspecified angina pectoris: Secondary | ICD-10-CM

## 2017-11-14 DIAGNOSIS — D509 Iron deficiency anemia, unspecified: Secondary | ICD-10-CM | POA: Diagnosis not present

## 2017-11-14 MED ORDER — LISINOPRIL 10 MG PO TABS: 10.0000 mg | ORAL_TABLET | Freq: Every day | ORAL | 3 refills | Status: DC

## 2017-11-14 NOTE — Patient Instructions (Addendum)
Your physician recommends that you schedule a follow-up appointment in:  3 months with Dr.McDowell      START Lisinopril 10 mg daily   All other medications stay the same    No lab work or tests ordered today.      Thank you for choosing [facility] !

## 2017-12-02 ENCOUNTER — Other Ambulatory Visit: Payer: Self-pay

## 2017-12-02 DIAGNOSIS — I6523 Occlusion and stenosis of bilateral carotid arteries: Secondary | ICD-10-CM

## 2017-12-09 ENCOUNTER — Encounter: Payer: Self-pay | Admitting: Vascular Surgery

## 2017-12-09 ENCOUNTER — Ambulatory Visit (HOSPITAL_COMMUNITY)
Admission: RE | Admit: 2017-12-09 | Discharge: 2017-12-09 | Disposition: A | Discharge status: Home / Self Care | Payer: Medicare Other | Source: Ambulatory Visit | Attending: Vascular Surgery | Admitting: Vascular Surgery

## 2017-12-09 ENCOUNTER — Ambulatory Visit: Payer: Medicare Other | Admitting: Vascular Surgery

## 2017-12-09 VITALS — BP 148/79 | HR 68 | Temp 97.6°F | Resp 18 | Ht 71.0 in | Wt 210.0 lb

## 2017-12-09 DIAGNOSIS — I6523 Occlusion and stenosis of bilateral carotid arteries: Secondary | ICD-10-CM

## 2017-12-09 NOTE — Progress Notes (Signed)
[facility] [place]  Patient name: William Walker MRN: [id number] DOB: 07-16-1926 Sex: male  REASON FOR CONSULT: Evaluation of extracranial cerebrovascular occlusive disease  HPI: William Walker is a 84 y.o. male, who is here today for follow-up.  He has a long known history of left carotid occlusion.  Has known moderate right carotid stenosis.  He underwent a recent follow-up duplex suggesting some progression of his right stenosis and also potentially new finding of left vertebral occlusion.  He is here today for further discussion.  He remains quite active despite his age of 41.  He recently underwent diagnostic cardiac catheterization revealing moderate disease being treated medically.  He denies any neurologic deficits.  Past Medical History:  Diagnosis Date  . CHF (congestive heart failure) ([place])   . Coronary atherosclerosis of native coronary artery    Reportedly 2 stents - presumably LAD (records not available)  . CVD (cerebrovascular disease)   . Essential hypertension, benign   . GERD (gastroesophageal reflux disease)   . Hyperlipidemia   . MI (myocardial infarction) [facility])    October 1999  . Type 2 diabetes mellitus with diabetic neuropathy (HCC)     Family History  Problem Relation Age of Onset  . Coronary artery disease Father     SOCIAL HISTORY: Social History   Socioeconomic History  . Marital status: Married    Spouse name: Not on file  . Number of children: Not on file  . Years of education: Not on file  . Highest education level: Not on file  Occupational History  . Not on file  Social Needs  . Financial resource strain: Not on file  . Food insecurity:    Worry: Not on file    Inability: Not on file  . Transportation needs:    Medical: Not on file    Non-medical: Not on file  Tobacco Use  . Smoking status: Former Smoker    Types: Pipe, Cigars    Last attempt to quit: 12/03/1948    Years since  quitting: 69.0  . Smokeless tobacco: Current User    Types: Chew  . Tobacco comment: has been chewing tobacco for 60-70 years  Substance and Sexual Activity  . Alcohol use: No  . Drug use: No  . Sexual activity: Yes    Birth control/protection: None  Lifestyle  . Physical activity:    Days per week: Not on file    Minutes per session: Not on file  . Stress: Not on file  Relationships  . Social connections:    Talks on phone: Not on file    Gets together: Not on file    Attends religious service: Not on file    Active member of club or organization: Not on file    Attends meetings of clubs or organizations: Not on file    Relationship status: Not on file  . Intimate partner violence:    Fear of current or ex partner: Not on file    Emotionally abused: Not on file    Physically abused: Not on file    Forced sexual activity: Not on file  Other Topics Concern  . Not on file  Social History Narrative  . Not on file    Allergies  Allergen Reactions  . Penicillins Other (See Comments)    Unknown Has patient had a PCN reaction causing immediate rash, facial/tongue/throat swelling, SOB or lightheadedness with hypotension: Unknown Has patient had a PCN reaction causing severe  rash involving mucus membranes or skin necrosis: Unknown Has patient had a PCN reaction that required hospitalization: Unknown Has patient had a PCN reaction occurring within the last 10 years: No If all of the above answers are "NO", then may proceed with Cephalosporin use.     Current Outpatient Medications  Medication Sig Dispense Refill  . Ascorbic Acid (VITAMIN C PO) Take 1 tablet by mouth daily.    Marland Kitchen aspirin EC 81 MG tablet Take 1 tablet (81 mg total) by mouth daily. 150 tablet 2  . furosemide (LASIX) 40 MG tablet Take 40 mg by mouth daily as needed for fluid.     Marland Kitchen lansoprazole (PREVACID) 15 MG capsule Take 15 mg by mouth daily with supper.     Marland Kitchen lisinopril (PRINIVIL,ZESTRIL) 10 MG tablet Take 1  tablet (10 mg total) by mouth daily. 90 tablet 3  . metoprolol succinate (TOPROL XL) 25 MG 24 hr tablet Take 1 tablet (25 mg total) by mouth daily. 90 tablet 3  . potassium chloride SA (KLOR-CON M20) 20 MEQ tablet Take 20 mEq by mouth daily as needed (with Lasix).     . pravastatin (PRAVACHOL) 40 MG tablet Take 40 mg by mouth at bedtime.      No current facility-administered medications for this visit.     REVIEW OF SYSTEMS:  [X]  denotes positive finding, [ ]  denotes negative finding Cardiac  Comments:  Chest pain or chest pressure: x   Shortness of breath upon exertion:    Short of breath when lying flat:    Irregular heart rhythm:        Vascular    Pain in calf, thigh, or hip brought on by ambulation:    Pain in feet at night that wakes you up from your sleep:     Blood clot in your veins:    Leg swelling:  x       Pulmonary    Oxygen at home:    Productive cough:     Wheezing:         Neurologic    Sudden weakness in arms or legs:     Sudden numbness in arms or legs:     Sudden onset of difficulty speaking or slurred speech:    Temporary loss of vision in one eye:     Problems with dizziness:         Gastrointestinal    Blood in stool:     Vomited blood:         Genitourinary    Burning when urinating:     Blood in urine:        Psychiatric    Major depression:         Hematologic    Bleeding problems:    Problems with blood clotting too easily:        Skin    Rashes or ulcers: x       Constitutional    Fever or chills:      PHYSICAL EXAM: There were no vitals filed for this visit.  GENERAL: The patient is a well-nourished male, in no acute distress. The vital signs are documented above. CARDIOVASCULAR: I do not hear carotid bruits bilaterally.  He does have a heart murmur.  Plus radial pulses bilaterally PULMONARY: There is good air exchange  ABDOMEN: Soft and non-tender  MUSCULOSKELETAL: There are no major deformities or cyanosis. NEUROLOGIC: No  focal weakness or paresthesias are detected. SKIN: There are no ulcers or rashes noted. PSYCHIATRIC:  The patient has a normal affect.  DATA:  I reviewed his carotid duplex from [facility].  He underwent repeat study in our office today.  Our velocities are somewhat lower than those obtained at [facility].  His systolic velocity max on the right is 245 cm/s.  He does appear to have probable occlusion of his left vertebral artery but antegrade right vertebral artery  MEDICAL ISSUES: I discussed these findings and length with patient and his family present.  Asymptomatic left carotid occlusion and moderate carotid stenosis.  He certainly remains below the threshold of where we would recommend meant for asymptomatic disease.  He is currently undergoing yearly duplex with Dr. Domenic Polite.  Would recommend continuation of this.  We will see him if he develops any symptoms or progression.  I did explain that the left vertebral artery may be a new finding but potentially could be an old finding.  He does have patency of antegrade right vertebral artery flow so explained that this would be not a cause for concern.  We will see him again as needed   Rosetta Posner, MD [facility] V[facility] and Vein Specialists of [facility] Tel [phone] Pager [phone]

## 2017-12-11 ENCOUNTER — Encounter (HOSPITAL_COMMUNITY): Payer: Medicare Other

## 2017-12-11 ENCOUNTER — Encounter: Payer: Medicare Other | Admitting: Vascular Surgery

## 2018-01-26 DIAGNOSIS — L723 Sebaceous cyst: Secondary | ICD-10-CM | POA: Diagnosis not present

## 2018-01-26 DIAGNOSIS — L719 Rosacea, unspecified: Secondary | ICD-10-CM | POA: Diagnosis not present

## 2018-01-27 ENCOUNTER — Ambulatory Visit: Payer: Medicare Other | Admitting: Gastroenterology

## 2018-01-28 DIAGNOSIS — I2510 Atherosclerotic heart disease of native coronary artery without angina pectoris: Secondary | ICD-10-CM | POA: Diagnosis not present

## 2018-01-28 DIAGNOSIS — E1151 Type 2 diabetes mellitus with diabetic peripheral angiopathy without gangrene: Secondary | ICD-10-CM | POA: Diagnosis not present

## 2018-01-28 DIAGNOSIS — D649 Anemia, unspecified: Secondary | ICD-10-CM | POA: Diagnosis not present

## 2018-01-28 DIAGNOSIS — R21 Rash and other nonspecific skin eruption: Secondary | ICD-10-CM | POA: Diagnosis not present

## 2018-02-24 ENCOUNTER — Encounter: Payer: Self-pay | Admitting: Cardiology

## 2018-02-24 ENCOUNTER — Ambulatory Visit: Payer: Medicare Other | Admitting: Cardiology

## 2018-02-24 VITALS — BP 136/80 | HR 71 | Ht 72.0 in | Wt 208.0 lb

## 2018-02-24 DIAGNOSIS — I350 Nonrheumatic aortic (valve) stenosis: Secondary | ICD-10-CM | POA: Diagnosis not present

## 2018-02-24 DIAGNOSIS — D509 Iron deficiency anemia, unspecified: Secondary | ICD-10-CM

## 2018-02-24 DIAGNOSIS — I6523 Occlusion and stenosis of bilateral carotid arteries: Secondary | ICD-10-CM

## 2018-02-24 DIAGNOSIS — I25119 Atherosclerotic heart disease of native coronary artery with unspecified angina pectoris: Secondary | ICD-10-CM

## 2018-02-24 NOTE — Progress Notes (Signed)
Cardiology Office Note  Date: 02/24/2018   ID: William Walker, DOB Oct 10, 1926, MRN [id number]  PCP: [name], MD  Primary Cardiologist: [name], MD   Chief Complaint  Patient presents with  . Aortic Stenosis  . Coronary Artery Disease    History of Present Illness: William Walker is an 84 y.o. male last seen in June.  He is here today for a routine follow-up visit.  Since last assessment he does not report any obvious worsening in stamina, no exertional angina symptoms.  Still has to pace himself, has balance trouble with neuropathy as well, but denies any falls.  Office consultation noted with Dr. Donnetta Hutching in July.  Carotid Dopplers were repeated at that time revealing 60 to 79% RICA stenosis and occluded LICA.  Medical therapy and observation was recommended.  I reviewed his current medications.  He remains on aspirin, lisinopril, Toprol-XL, Pravachol, and Lasix with potassium supplements.  He did have recent lab work done in follow-up, hemoglobin has improved significantly up to 12.8.  Past Medical History:  Diagnosis Date  . CHF (congestive heart failure) ([place])   . Coronary atherosclerosis of native coronary artery    Reportedly 2 stents - presumably LAD (records not available)  . CVD (cerebrovascular disease)   . Essential hypertension, benign   . GERD (gastroesophageal reflux disease)   . Hyperlipidemia   . MI (myocardial infarction) [facility])    October 1999  . Type 2 diabetes mellitus with diabetic neuropathy [facility])     Past Surgical History:  Procedure Laterality Date  . Arch cerebral ateriogram  12/11/2005   Rosetta Posner MD  . CATARACT EXTRACTION, BILATERAL    . INGUINAL HERNIA REPAIR Left 12/09/2012   Procedure: HERNIA REPAIR INGUINAL with mesh;  Surgeon: [name], MD;  Location: [facility];  Service: General;  Laterality: Left;  . INSERTION OF MESH Left 12/09/2012   Procedure: INSERTION OF MESH;  Surgeon: [name], MD;  Location: [facility];  Service:  General;  Laterality: Left;  . RIGHT/LEFT HEART CATH AND CORONARY ANGIOGRAPHY N/A 10/07/2017   Procedure: RIGHT/LEFT HEART CATH AND CORONARY ANGIOGRAPHY;  Surgeon: [name], MD;  Location: [facility];  Service: Cardiovascular;  Laterality: N/A;    Current Outpatient Medications  Medication Sig Dispense Refill  . Ascorbic Acid (VITAMIN C PO) Take 1 tablet by mouth daily.    Marland Kitchen aspirin EC 81 MG tablet Take 1 tablet (81 mg total) by mouth daily. 150 tablet 2  . augmented betamethasone dipropionate (DIPROLENE-AF) 0.05 % cream APPLY CREAM TOPICALLY TO AFFECTED AREA(S) DAILY AS DIRECTED AFTER BATHING  5  . furosemide (LASIX) 40 MG tablet Take 40 mg by mouth daily as needed for fluid.     Marland Kitchen lansoprazole (PREVACID) 15 MG capsule Take 15 mg by mouth daily with supper.     Marland Kitchen lisinopril (PRINIVIL,ZESTRIL) 10 MG tablet Take 1 tablet (10 mg total) by mouth daily. 90 tablet 3  . metoprolol succinate (TOPROL XL) 25 MG 24 hr tablet Take 1 tablet (25 mg total) by mouth daily. 90 tablet 3  . potassium chloride SA (KLOR-CON M20) 20 MEQ tablet Take 20 mEq by mouth daily as needed (with Lasix).     . pravastatin (PRAVACHOL) 40 MG tablet Take 40 mg by mouth at bedtime.      No current facility-administered medications for this visit.    Allergies:  Penicillins   Social History: The patient  reports that he quit smoking about  69 years ago. His smoking use included pipe and cigars. His smokeless tobacco use includes chew. He reports that he does not drink alcohol or use drugs.   ROS:  Please see the history of present illness. Otherwise, complete review of systems is positive for hearing loss, leg weakness, neuropathy.  All other systems are reviewed and negative.   Physical Exam: VS:  BP 136/80 (BP Location: Left Arm)   Pulse 71   Ht 6' (1.829 m)   Wt 208 lb (94.3 kg)   SpO2 97%   BMI 28.21 kg/m , BMI Body mass index is 28.21 kg/m.  Wt Readings from Last 3 Encounters:  02/24/18 208 lb (94.3  kg)  12/09/17 210 lb (95.3 kg)  11/14/17 217 lb (98.4 kg)    General: Elderly male, appears comfortable at rest. HEENT: Conjunctiva and lids normal, oropharynx clear. Neck: Supple, no elevated JVP, right carotid bruit, no thyromegaly. Lungs: Clear to auscultation, nonlabored breathing at rest. Cardiac: Regular rate and rhythm, no S3, 3/6 systolic murmur. Abdomen: Soft, nontender, bowel sounds present. Extremities: No pitting edema, distal pulses 2+. Skin: Warm and dry. Musculoskeletal: No kyphosis. Neuropsychiatric: Alert and oriented x3, affect grossly appropriate.  ECG: I personally reviewed the tracing from 10/07/2017 which showed sinus rhythm with prolonged PR interval and LVH.  Recent Labwork: 10/02/2017: BUN 22; Creatinine, Ser 1.13; Potassium 4.2; Sodium 137 10/07/2017: Platelets 230 10/08/2017: Hemoglobin 8.02 January 2018: Hemoglobin 12.8, platelets 264, BUN 16, creatinine 1.17, potassium 5.3, TSH 5.79, hemoglobin A1c 7.6  Other Studies Reviewed Today:  Cardiac catheterization 10/07/2017: 1. Moderate diffuse coronary artery disease with moderate left mainstem stenosis, moderate stenosis of the proximal circumflex, mild nonobstructive stenosis of the LAD and RCA, and moderately severe stenosis of the diagonal origin 2. Continued patency of the stented segment in the LAD 3. Moderate aortic stenosis with a mean transvalvular gradient of 26 mmHg  Recommendations: All available data is reviewed. The patient really does not have any targets for PCI. I suspect a combination of moderate aortic stenosis, moderate coronary artery disease, and significant anemia accounts in combination for his symptoms. His hemoglobin is approximately 8 mg/dL which is down from a normal hemoglobin of 13 mg/dL last year. Advised follow-up with his primary care physician for consideration of elective blood transfusion and further work-up. I think his cardiac disease can be followed with medical therapy and  serial echo follow-up. Findings discussed with Dr. Domenic Polite.  Note if the patient requires repeat catheterization, I would not recommend using the right radial as an access site because of severe right subclavian tortuosity.  Echocardiogram 08/15/2017: Study Conclusions  - Left ventricle: The cavity size was normal. Wall thickness was   increased in a pattern of moderate LVH. Systolic function was   normal. The estimated ejection fraction was in the range of 60%   to 65%. Wall motion was normal; there were no regional wall   motion abnormalities. Doppler parameters are consistent with   abnormal left ventricular relaxation (grade 1 diastolic   dysfunction). - Aortic valve: There was moderate to severe stenosis. There was   mild regurgitation. Mean gradient (S): 27 mm Hg. Peak gradient   (S): 42 mm Hg. VTI ratio of LVOT to aortic valve: 0.27. Valve   area (VTI): 0.95 cm^2. Valve area (Vmax): 0.99 cm^2. - Mitral valve: Mildly calcified annulus. There was trivial   regurgitation. - Left atrium: The atrium was mildly dilated. - Right atrium: Central venous pressure (est): 3 mm Hg. - Atrial  septum: No defect or patent foramen ovale was identified. - Tricuspid valve: There was mild regurgitation. - Pulmonary arteries: PA peak pressure: 31 mm Hg (S). - Pericardium, extracardiac: There was no pericardial effusion.  Assessment and Plan:  1.  Moderate aortic stenosis based on assessment at right and left heart catheterization in May.  Plan is to continue surveillance imaging, echocardiogram will be repeated prior to his next office visit in March.  2.  Multivessel CAD, overall moderate with patent LAD stent site as of angiography in May.  No active angina.  Continue aspirin and Pravachol.  3.  Bilateral carotid artery disease with occluded LICA and 60 to 08% R ICA stenosis.  He has established with Dr. Donnetta Hutching.  Plan to continue medical therapy and observation for now.  4.  Iron deficiency  anemia, hemoglobin is improved to 12.8.  He follows with Dr. Luan Pulling.  Current medicines were reviewed with the patient today.   Orders Placed This Encounter  Procedures  . ECHOCARDIOGRAM COMPLETE    Disposition: Follow-up in 6 months.  Signed, Satira Sark, MD, [facility] 02/24/2018 11:24 AM    [facility] [address], [place], [place] [address] Phone: [phone]; Fax: [phone]

## 2018-02-24 NOTE — Patient Instructions (Signed)
Your physician wants you to follow-up in:  6 months with Dr.McDowell You will receive a reminder letter in the mail two months in advance. If you don't receive a letter, please call our office to schedule the follow-up appointment.   Your physician has requested that you have an echocardiogram (IN 6 MONTHS JUST BEFORE FOLLOW UP VISIT). Echocardiography is a painless test that uses sound waves to create images of your heart. It provides your doctor with information about the size and shape of your heart and how well your heart's chambers and valves are working. This procedure takes approximately one hour. There are no restrictions for this procedure.      Your physician recommends that you continue on your current medications as directed. Please refer to the Current Medication list given to you today.    If you need a refill on your cardiac medications before your next appointment, please call your pharmacy.     No labs ordered today.       Thank you for choosing [facility] !

## 2018-03-25 DIAGNOSIS — R202 Paresthesia of skin: Secondary | ICD-10-CM | POA: Diagnosis not present

## 2018-03-25 DIAGNOSIS — Z23 Encounter for immunization: Secondary | ICD-10-CM | POA: Diagnosis not present

## 2018-05-04 DIAGNOSIS — Z23 Encounter for immunization: Secondary | ICD-10-CM | POA: Diagnosis not present

## 2018-05-04 DIAGNOSIS — Z0000 Encounter for general adult medical examination without abnormal findings: Secondary | ICD-10-CM | POA: Diagnosis not present

## 2018-05-05 DIAGNOSIS — E785 Hyperlipidemia, unspecified: Secondary | ICD-10-CM | POA: Diagnosis not present

## 2018-05-05 DIAGNOSIS — Z0000 Encounter for general adult medical examination without abnormal findings: Secondary | ICD-10-CM | POA: Diagnosis not present

## 2018-05-05 DIAGNOSIS — D649 Anemia, unspecified: Secondary | ICD-10-CM | POA: Diagnosis not present

## 2018-05-05 DIAGNOSIS — I2510 Atherosclerotic heart disease of native coronary artery without angina pectoris: Secondary | ICD-10-CM | POA: Diagnosis not present

## 2018-05-05 DIAGNOSIS — E1140 Type 2 diabetes mellitus with diabetic neuropathy, unspecified: Secondary | ICD-10-CM | POA: Diagnosis not present

## 2018-05-05 LAB — CMP 10231
ALT: 23 (ref 3–30)
AST: 19
Albumin/Globulin Ratio: 2.6
Albumin: 4.6
Alkaline Phosphatase: 75
BUN/Creatinine Ratio: 14
BUN: 17 (ref 4–21)
Calcium: 9.4
Carbon Dioxide, Total: 25
Chloride: 100
Creat: 1.23
EGFR (African American): 60
EGFR (Non-African Amer.): 52
Globulin, Total: 1.8
Glucose: 162
Potassium: 4.6
Sodium: 142
Total Bilirubin: 1
Total Protein: 6.4 (ref 6.4–8.2)

## 2018-05-05 LAB — CBC WITH DIFF/PLATELET
BASO(ABSOLUTE): 0
Basophils: 1
Eosinophils Absolute: 1
HCT: 41 (ref 29–41)
Hemoglobin: 14
Immature Granulocytes: 0
Lymphs Abs: 2.9
MCH: 33.9
MCHC: 34
MCV: 100 (ref 76–111)
Monocytes(Absolute): 0.6
Monocytes: 8
Neutro Abs: 3.6
Neutrophils: 45
RBC: 4.13
RDW: 13.6
WBC: 7.9
lymph#: 37
platelet count: 229

## 2018-05-06 LAB — LIPID PANEL
Cholesterol, Total: 179
HDL Cholesterol: 54 (ref 35–70)
LDL Cholesterol: 98
Triglycerides: 134 (ref 40–160)
VLDL Cholesterol Cal: 27

## 2018-05-06 LAB — HEMOGLOBIN A1C: Hgb A1c MFr Bld: 8.1 — AB (ref 4.0–6.0)

## 2018-06-07 ENCOUNTER — Emergency Department (HOSPITAL_COMMUNITY): Payer: Medicare Other

## 2018-06-07 ENCOUNTER — Encounter (HOSPITAL_COMMUNITY): Payer: Self-pay | Admitting: Emergency Medicine

## 2018-06-07 ENCOUNTER — Other Ambulatory Visit: Payer: Self-pay

## 2018-06-07 ENCOUNTER — Emergency Department (HOSPITAL_COMMUNITY)
Admission: EM | Admit: 2018-06-07 | Discharge: 2018-06-07 | Disposition: A | Discharge status: Home / Self Care | Payer: Medicare Other | Attending: Emergency Medicine | Admitting: Emergency Medicine

## 2018-06-07 DIAGNOSIS — H538 Other visual disturbances: Secondary | ICD-10-CM | POA: Diagnosis not present

## 2018-06-07 DIAGNOSIS — I509 Heart failure, unspecified: Secondary | ICD-10-CM | POA: Diagnosis not present

## 2018-06-07 DIAGNOSIS — E119 Type 2 diabetes mellitus without complications: Secondary | ICD-10-CM | POA: Diagnosis not present

## 2018-06-07 DIAGNOSIS — Z7982 Long term (current) use of aspirin: Secondary | ICD-10-CM | POA: Insufficient documentation

## 2018-06-07 DIAGNOSIS — R519 Headache, unspecified: Secondary | ICD-10-CM

## 2018-06-07 DIAGNOSIS — I6502 Occlusion and stenosis of left vertebral artery: Secondary | ICD-10-CM | POA: Diagnosis not present

## 2018-06-07 DIAGNOSIS — Z79899 Other long term (current) drug therapy: Secondary | ICD-10-CM | POA: Diagnosis not present

## 2018-06-07 DIAGNOSIS — I110 Hypertensive heart disease with heart failure: Secondary | ICD-10-CM | POA: Diagnosis not present

## 2018-06-07 DIAGNOSIS — R51 Headache: Secondary | ICD-10-CM | POA: Diagnosis not present

## 2018-06-07 DIAGNOSIS — I10 Essential (primary) hypertension: Secondary | ICD-10-CM | POA: Diagnosis not present

## 2018-06-07 LAB — CBC WITH DIFFERENTIAL/PLATELET
Abs Immature Granulocytes: 0.03 10*3/uL (ref 0.00–0.07)
Basophils Absolute: 0 10*3/uL (ref 0.0–0.1)
Basophils Relative: 0 %
Eosinophils Absolute: 0.2 10*3/uL (ref 0.0–0.5)
Eosinophils Relative: 2 %
HCT: 41.2 % (ref 39.0–52.0)
Hemoglobin: 13.8 g/dL (ref 13.0–17.0)
Immature Granulocytes: 0 %
Lymphocytes Relative: 25 %
Lymphs Abs: 2.2 10*3/uL (ref 0.7–4.0)
MCH: 33.7 pg (ref 26.0–34.0)
MCHC: 33.5 g/dL (ref 30.0–36.0)
MCV: 100.5 fL — ABNORMAL HIGH (ref 80.0–100.0)
Monocytes Absolute: 0.9 10*3/uL (ref 0.1–1.0)
Monocytes Relative: 9 %
Neutro Abs: 5.7 10*3/uL (ref 1.7–7.7)
Neutrophils Relative %: 64 %
Platelets: 217 10*3/uL (ref 150–400)
RBC: 4.1 MIL/uL — ABNORMAL LOW (ref 4.22–5.81)
RDW: 12.8 % (ref 11.5–15.5)
WBC: 9.1 10*3/uL (ref 4.0–10.5)
nRBC: 0 % (ref 0.0–0.2)

## 2018-06-07 LAB — COMPREHENSIVE METABOLIC PANEL
ALT: 17 U/L (ref 0–44)
ANION GAP: 9 (ref 5–15)
AST: 21 U/L (ref 15–41)
Albumin: 3.8 g/dL (ref 3.5–5.0)
Alkaline Phosphatase: 57 U/L (ref 38–126)
BILIRUBIN TOTAL: 1.1 mg/dL (ref 0.3–1.2)
BUN: 15 mg/dL (ref 8–23)
CALCIUM: 9.2 mg/dL (ref 8.9–10.3)
CO2: 24 mmol/L (ref 22–32)
CREATININE: 1.13 mg/dL (ref 0.61–1.24)
Chloride: 96 mmol/L — ABNORMAL LOW (ref 98–111)
GFR, EST NON AFRICAN AMERICAN: 57 mL/min — AB (ref >60)
Glucose, Bld: 181 mg/dL — ABNORMAL HIGH (ref 70–99)
Potassium: 4 mmol/L (ref 3.5–5.1)
Sodium: 129 mmol/L — ABNORMAL LOW (ref 135–145)
TOTAL PROTEIN: 6.4 g/dL — AB (ref 6.5–8.1)

## 2018-06-07 LAB — SEDIMENTATION RATE: Sed Rate: 14 mm/hr (ref 0–16)

## 2018-06-07 MED ORDER — ACETAMINOPHEN 500 MG PO TABS
1000.0000 mg | ORAL_TABLET | Freq: Once | ORAL | Status: AC
  Administered 2018-06-07: 1000 mg via ORAL
  Filled 2018-06-07: qty 2

## 2018-06-07 MED ORDER — SODIUM CHLORIDE 0.9 % IV BOLUS
500.0000 mL | Freq: Once | INTRAVENOUS | Status: AC
  Administered 2018-06-07: 500 mL via INTRAVENOUS

## 2018-06-07 MED ORDER — HYDROCODONE-ACETAMINOPHEN 5-325 MG PO TABS: 1.0000 | ORAL_TABLET | Freq: Four times a day (QID) | ORAL | 0 refills | Status: DC | PRN

## 2018-06-07 MED ORDER — TETRACAINE HCL 0.5 % OP SOLN
2.0000 [drp] | Freq: Once | OPHTHALMIC | Status: AC
  Administered 2018-06-07: 2 [drp] via OPHTHALMIC
  Filled 2018-06-07: qty 4

## 2018-06-07 NOTE — Discharge Instructions (Signed)
1.  Schedule follow-up appointment with Dr. Arnoldo Morale of neurosurgery.  You should also call your ophthalmologist tomorrow and schedule an appointment with them as soon as possible. 2.  Take extra strength Tylenol every 6 hours for pain if needed.  If you need a stronger pain medication, you can try 1 Vicodin tablet with 1 extra strength Tylenol tablet.  If you need additional pain medication, you may try 2 Vicodin tablets if 1 did not cause any confusion, lightheadedness or other symptoms.  Keep in mind, every Vicodin tablet contains the amount of Tylenol of 1 regular strength Tylenol tablet.  Be sure to closely monitor how much Tylenol you are taking so that it does not exceed a maximum total daily dose of 4000mg .

## 2018-06-07 NOTE — ED Provider Notes (Signed)
[name] Provider Note   CSN: [id number] Arrival date & time: 06/07/18  1254     History   Chief Complaint Chief Complaint  Patient presents with  . Headache  . Blurred Vision    HPI William Walker is a 85 y.o. male.  HPI Patient reports he developed a headache yesterday morning.  At first he had a pressure behind his eyes.  He reports quickly over several hours it became intense and viselike.  He reports that he developed some blurred vision.  He did not experience double vision.  Patient did not develop nausea or vomiting.  He denies neck stiffness.  He denies incoordination or focal weakness or numbness of the extremities.  He did not have fever that he is aware of.  He denies history of similar headache.  He reports headache is made worse by jolting movements.  He denies he had recent sore throat sinus congestion.  Patient takes a daily aspirin.  No history of trauma.  Patient denies history of glaucoma or other intraocular problems.  He has had cataract replacement. Past Medical History:  Diagnosis Date  . CHF (congestive heart failure) ([place])   . Coronary atherosclerosis of native coronary artery    Reportedly 2 stents - presumably LAD (records not available)  . CVD (cerebrovascular disease)   . Essential hypertension, benign   . GERD (gastroesophageal reflux disease)   . Hyperlipidemia   . MI (myocardial infarction) [facility])    October 1999  . Type 2 diabetes mellitus with diabetic neuropathy [facility])     Patient Active Problem List   Diagnosis Date Noted  . Chest pain 02/05/2017  . Aortic stenosis 02/05/2017  . Type II or unspecified type diabetes mellitus with other specified manifestations, not stated as uncontrolled 06/17/2013  . Occlusion and stenosis of carotid artery without mention of cerebral infarction 06/17/2013  . Inguinal hernia 06/17/2013  . Neuropathy due to secondary diabetes mellitus ([place]) 06/17/2013  . Peripheral  neuropathy 04/01/2012  . Peripheral vascular disease of foot ([place]) 04/01/2012  . Bilateral carotid artery disease ([place]) 04/01/2012  . CAD, NATIVE VESSEL 01/25/2010  . Hyperlipidemia 12/23/2008  . Essential hypertension 12/23/2008  . CEREBROVASCULAR DISEASE, HX OF 12/23/2008    Past Surgical History:  Procedure Laterality Date  . Arch cerebral ateriogram  12/11/2005   Rosetta Posner MD  . CATARACT EXTRACTION, BILATERAL    . INGUINAL HERNIA REPAIR Left 12/09/2012   Procedure: HERNIA REPAIR INGUINAL with mesh;  Surgeon: [name], MD;  Location: [facility];  Service: General;  Laterality: Left;  . INSERTION OF MESH Left 12/09/2012   Procedure: INSERTION OF MESH;  Surgeon: [name], MD;  Location: [facility];  Service: General;  Laterality: Left;  . RIGHT/LEFT HEART CATH AND CORONARY ANGIOGRAPHY N/A 10/07/2017   Procedure: RIGHT/LEFT HEART CATH AND CORONARY ANGIOGRAPHY;  Surgeon: [name], MD;  Location: [facility];  Service: Cardiovascular;  Laterality: N/A;        Home Medications    Prior to Admission medications   Medication Sig Start Date End Date Taking? Authorizing Provider  Ascorbic Acid (VITAMIN C PO) Take 1 tablet by mouth daily.   Yes [provider]  aspirin EC 81 MG tablet Take 1 tablet (81 mg total) by mouth daily. 10/07/17 10/07/18 Yes [name], MD  augmented betamethasone dipropionate (DIPROLENE-AF) 0.05 % cream Apply 1 application topically daily as needed (itching).  01/26/18  Yes [provider]  furosemide (LASIX) 40 MG tablet Take 40 mg by mouth daily as needed for fluid.  09/09/16  Yes [provider]  lansoprazole (PREVACID) 15 MG capsule Take 15 mg by mouth daily with supper.    Yes [provider]  lisinopril (PRINIVIL,ZESTRIL) 10 MG tablet Take 1 tablet (10 mg total) by mouth daily. 11/14/17 06/07/18 Yes Satira Sark, MD  metoprolol succinate (TOPROL XL) 25 MG 24 hr tablet Take 1 tablet (25 mg total) by mouth  daily. 09/22/17  Yes Satira Sark, MD  potassium chloride SA (KLOR-CON M20) 20 MEQ tablet Take 20 mEq by mouth daily as needed (with Lasix).  09/09/16  Yes [provider]  pravastatin (PRAVACHOL) 40 MG tablet Take 40 mg by mouth at bedtime.    Yes [provider]  sitaGLIPtin (JANUVIA) 50 MG tablet Take 50 mg by mouth daily.   Yes [provider]  HYDROcodone-acetaminophen (NORCO/VICODIN) 5-325 MG tablet Take 1-2 tablets by mouth every 6 (six) hours as needed for moderate pain or severe pain. 06/07/18   Charlesetta Shanks, MD    Family History Family History  Problem Relation Age of Onset  . Coronary artery disease Father     Social History Social History   Tobacco Use  . Smoking status: Former Smoker    Types: Pipe, Cigars    Last attempt to quit: 12/03/1948    Years since quitting: 69.5  . Smokeless tobacco: Current User    Types: Chew  . Tobacco comment: has been chewing tobacco for 60-70 years  Substance Use Topics  . Alcohol use: No  . Drug use: No     Allergies   Penicillins   Review of Systems Review of Systems 10 Systems reviewed and are negative for acute change except as noted in the HPI.   Physical Exam Updated Vital Signs BP 133/64   Pulse 63   Temp 99.8 F (37.7 C) (Oral)   Resp 18   SpO2 96%   Physical Exam Constitutional:      General: He is not in acute distress.    Appearance: Normal appearance. He is not ill-appearing.  HENT:     Head: Normocephalic and atraumatic.     Comments: Patient has some discomfort to palpation over the temples.  No percussion tenderness over sinuses.  Bilateral TMs normal.  Moderate cerumen in right ear canal.  Not impacted.  Posterior oropharynx widely patent.  Dentition nontender.  No facial swelling.  No rashes.  Neck supple.    Mouth/Throat:     Mouth: Mucous membranes are moist.  Eyes:     Extraocular Movements: Extraocular movements intact.     Pupils: Pupils are equal, round, and  reactive to light.     Comments: Intraocular pressure measured with Tono-Pen.  Measures average between 18 and 20 both eyes.  Neck:     Musculoskeletal: Normal range of motion and neck supple.  Cardiovascular:     Rate and Rhythm: Normal rate and regular rhythm.     Comments: 3\6 systolic ejection murmur. Pulmonary:     Effort: Pulmonary effort is normal.     Breath sounds: Normal breath sounds.  Abdominal:     General: There is no distension.     Palpations: Abdomen is soft.     Tenderness: There is no abdominal tenderness. There is no guarding.  Musculoskeletal: Normal range of motion.        General: No swelling or tenderness.     Right lower leg: No  edema.     Left lower leg: No edema.  Skin:    General: Skin is warm.  Neurological:     General: No focal deficit present.     Mental Status: He is alert and oriented to person, place, and time.     Cranial Nerves: No cranial nerve deficit.     Sensory: No sensory deficit.     Motor: No weakness.     Coordination: Coordination normal.  Psychiatric:        Mood and Affect: Mood normal.      ED Treatments / Results  Labs (all labs ordered are listed, but only abnormal results are displayed) Labs Reviewed  CBC WITH DIFFERENTIAL/PLATELET - Abnormal; Notable for the following components:      Result Value   RBC 4.10 (*)    MCV 100.5 (*)    All other components within normal limits  COMPREHENSIVE METABOLIC PANEL - Abnormal; Notable for the following components:   Sodium 129 (*)    Chloride 96 (*)    Glucose, Bld 181 (*)    Total Protein 6.4 (*)    GFR calc non Af Amer 57 (*)    All other components within normal limits  SEDIMENTATION RATE    EKG EKG Interpretation  Date/Time:  Sunday June 07 2018 13:02:01 EST Ventricular Rate:  76 PR Interval:  230 QRS Duration: 92 QT Interval:  394 QTC Calculation: 443 R Axis:   -36 Text Interpretation:  Sinus rhythm with 1st degree A-V block Left axis deviation Moderate  voltage criteria for LVH, may be normal variant Nonspecific ST and T wave abnormality Abnormal ECG no change from previous Confirmed by Charlesetta Shanks [phone]) on 06/07/2018 3:07:15 PM   Radiology Ct Head Wo Contrast  Result Date: 06/07/2018 CLINICAL DATA:  Severe headache yesterday now has become dull pain. Possible blurred vision. EXAM: CT HEAD WITHOUT CONTRAST TECHNIQUE: Contiguous axial images were obtained from the base of the skull through the vertex without intravenous contrast. COMPARISON:  None. FINDINGS: Brain: Ventricles, cisterns and other CSF spaces are normal. Minimal chronic ischemic microvascular disease is present. There is no mass, mass effect, shift of midline structures or acute hemorrhage. No evidence of acute infarction. Vascular: No hyperdense vessel or unexpected calcification. Skull: Normal. Negative for fracture or focal lesion. Sinuses/Orbits: No acute finding. Other: None. IMPRESSION: No acute findings. Mild chronic ischemic microvascular disease. Electronically Signed   By: Marin Olp M.D.   On: 06/07/2018 15:50   Mr Virgel Paling JJ Contrast  Result Date: 06/07/2018 CLINICAL DATA:  Initial evaluation for acute severe headache with blurry vision, left leg weakness. EXAM: MRI HEAD WITHOUT CONTRAST MRA HEAD WITHOUT CONTRAST TECHNIQUE: Multiplanar, multiecho pulse sequences of the brain and surrounding structures were obtained without intravenous contrast. Angiographic images of the head were obtained using MRA technique without contrast. COMPARISON:  Comparison made with prior CT from earlier the same day as well as prior carotid ultrasound from 10/17/2017. FINDINGS: MRI HEAD FINDINGS Brain: Generalized age appropriate cerebral atrophy. Patchy and confluent T2/FLAIR hyperintensity within the periventricular and deep white matter both cerebral hemispheres most consistent with chronic microvascular ischemic disease, mild for age. Superimposed chronic lacunar infarct present at the left  caudate head. Scattered multifocal chronic left cerebellar infarcts noted as well. No abnormal foci of restricted diffusion to suggest acute or subacute ischemia. Gray-white matter differentiation maintained. No other areas of chronic infarction. No foci of susceptibility artifact to suggest acute or chronic intracranial hemorrhage. No mass lesion, midline  shift or mass effect. No hydrocephalus. No extra-axial fluid collection. Pituitary gland within normal limits. Midline structures intact and normal. Vascular: Abnormal flow void within the left ICA to the terminus, likely occluded. Additional abnormal flow void seen within the diminutive left vertebral artery as it courses into the cranial to the level of the left PICA, likely related to slow flow as this appears patent on corresponding MRA. Major intracranial vascular flow voids otherwise maintained. Skull and upper cervical spine: Craniocervical junction within normal limits. Upper cervical spine normal. No focal marrow replacing lesion. Scalp soft tissues unremarkable. Sinuses/Orbits: Patient status post bilateral ocular lens replacement. Scattered mucosal thickening throughout the paranasal sinuses, chronic in appearance. No air-fluid levels to suggest acute sinusitis. Small left mastoid effusion, of doubtful significance. Other: Approximate 2.6 cm well-circumscribed T2 hyperintense lesion noted within the right parotid gland (series 20, image 21), indeterminate. MRA HEAD FINDINGS ANTERIOR CIRCULATION: Absent flow related signal seen within the left ICA to the level of the terminus. Right ICA demonstrates mild multifocal atheromatous irregularity but is widely patent to the terminus without hemodynamically significant stenosis. A1 segments patent bilaterally. Anterior communicating artery complex somewhat irregular and ectatic in appearance. Possible 5 mm outpouching at this level could reflect a small irregular aneurysm (series 9, image 112), not entirely  certain as there is some motion artifact through this region. Anterior cerebral arteries mildly irregular but widely patent to their distal aspects. Right M1 widely patent without stenosis. Normal right MCA bifurcation. Distal right MCA branches well perfused There is perfusion of the left MCA via of collateralization across the circle-of-Willis. Short-segment mild stenosis noted within the distal left M1 segment. Left M1 otherwise widely patent. Normal left MCA bifurcation with good perfusion of the distal left MCA branches. Overall flow within the left MCA territory attenuated as compared to the right due to collateralization. POSTERIOR CIRCULATION: Dominant right vertebral artery patent to the vertebrobasilar junction without stenosis. Hypoplastic left vertebral artery is patent at the skull base. Short-segment moderate distal left V4 stenosis just prior to the vertebrobasilar junction (series 1042, image 8). Posterior inferior cerebral arteries patent bilaterally. Basilar patent to its distal aspect without stenosis. Superior cerebral arteries patent bilaterally. Both PCAs demonstrate mild scattered atheromatous irregularity but are perfused to their distal aspects. Distal small vessel atheromatous irregularity seen throughout the intracranial circulation. IMPRESSION: MRI HEAD IMPRESSION: 1. No acute intracranial abnormality. 2. Age-appropriate cerebral atrophy with mild chronic small vessel ischemic disease. Small remote lacunar infarcts involving the left basal ganglia and left cerebellum. 3. Abnormal flow void within the left ICA, consistent with occlusion. See below. MRA HEAD IMPRESSION: 1. Chronic left ICA occlusion to the level of the ICA terminus. Left MCA artery is perfused via collateralization across the circle-of-Willis. 2. Short-segment moderate distal left V4 stenosis. Dominant right vertebral artery widely patent to the vertebrobasilar junction. 3. Diffuse small vessel atheromatous irregularity  throughout the intracranial circulation. 4. Question 5 mm outpouching arising from the anterior communicating artery complex, which could reflect a small aneurysm, not entirely certain given motion artifact through this region on this exam. Attention at follow-up recommended. Electronically Signed   By: Jeannine Boga M.D.   On: 06/07/2018 22:12   Mr Brain Wo Contrast  Result Date: 06/07/2018 CLINICAL DATA:  Initial evaluation for acute severe headache with blurry vision, left leg weakness. EXAM: MRI HEAD WITHOUT CONTRAST MRA HEAD WITHOUT CONTRAST TECHNIQUE: Multiplanar, multiecho pulse sequences of the brain and surrounding structures were obtained without intravenous contrast. Angiographic images of the head  were obtained using MRA technique without contrast. COMPARISON:  Comparison made with prior CT from earlier the same day as well as prior carotid ultrasound from 10/17/2017. FINDINGS: MRI HEAD FINDINGS Brain: Generalized age appropriate cerebral atrophy. Patchy and confluent T2/FLAIR hyperintensity within the periventricular and deep white matter both cerebral hemispheres most consistent with chronic microvascular ischemic disease, mild for age. Superimposed chronic lacunar infarct present at the left caudate head. Scattered multifocal chronic left cerebellar infarcts noted as well. No abnormal foci of restricted diffusion to suggest acute or subacute ischemia. Gray-white matter differentiation maintained. No other areas of chronic infarction. No foci of susceptibility artifact to suggest acute or chronic intracranial hemorrhage. No mass lesion, midline shift or mass effect. No hydrocephalus. No extra-axial fluid collection. Pituitary gland within normal limits. Midline structures intact and normal. Vascular: Abnormal flow void within the left ICA to the terminus, likely occluded. Additional abnormal flow void seen within the diminutive left vertebral artery as it courses into the cranial to the level  of the left PICA, likely related to slow flow as this appears patent on corresponding MRA. Major intracranial vascular flow voids otherwise maintained. Skull and upper cervical spine: Craniocervical junction within normal limits. Upper cervical spine normal. No focal marrow replacing lesion. Scalp soft tissues unremarkable. Sinuses/Orbits: Patient status post bilateral ocular lens replacement. Scattered mucosal thickening throughout the paranasal sinuses, chronic in appearance. No air-fluid levels to suggest acute sinusitis. Small left mastoid effusion, of doubtful significance. Other: Approximate 2.6 cm well-circumscribed T2 hyperintense lesion noted within the right parotid gland (series 20, image 21), indeterminate. MRA HEAD FINDINGS ANTERIOR CIRCULATION: Absent flow related signal seen within the left ICA to the level of the terminus. Right ICA demonstrates mild multifocal atheromatous irregularity but is widely patent to the terminus without hemodynamically significant stenosis. A1 segments patent bilaterally. Anterior communicating artery complex somewhat irregular and ectatic in appearance. Possible 5 mm outpouching at this level could reflect a small irregular aneurysm (series 9, image 112), not entirely certain as there is some motion artifact through this region. Anterior cerebral arteries mildly irregular but widely patent to their distal aspects. Right M1 widely patent without stenosis. Normal right MCA bifurcation. Distal right MCA branches well perfused There is perfusion of the left MCA via of collateralization across the circle-of-Willis. Short-segment mild stenosis noted within the distal left M1 segment. Left M1 otherwise widely patent. Normal left MCA bifurcation with good perfusion of the distal left MCA branches. Overall flow within the left MCA territory attenuated as compared to the right due to collateralization. POSTERIOR CIRCULATION: Dominant right vertebral artery patent to the  vertebrobasilar junction without stenosis. Hypoplastic left vertebral artery is patent at the skull base. Short-segment moderate distal left V4 stenosis just prior to the vertebrobasilar junction (series 1042, image 8). Posterior inferior cerebral arteries patent bilaterally. Basilar patent to its distal aspect without stenosis. Superior cerebral arteries patent bilaterally. Both PCAs demonstrate mild scattered atheromatous irregularity but are perfused to their distal aspects. Distal small vessel atheromatous irregularity seen throughout the intracranial circulation. IMPRESSION: MRI HEAD IMPRESSION: 1. No acute intracranial abnormality. 2. Age-appropriate cerebral atrophy with mild chronic small vessel ischemic disease. Small remote lacunar infarcts involving the left basal ganglia and left cerebellum. 3. Abnormal flow void within the left ICA, consistent with occlusion. See below. MRA HEAD IMPRESSION: 1. Chronic left ICA occlusion to the level of the ICA terminus. Left MCA artery is perfused via collateralization across the circle-of-Willis. 2. Short-segment moderate distal left V4 stenosis. Dominant right vertebral artery  widely patent to the vertebrobasilar junction. 3. Diffuse small vessel atheromatous irregularity throughout the intracranial circulation. 4. Question 5 mm outpouching arising from the anterior communicating artery complex, which could reflect a small aneurysm, not entirely certain given motion artifact through this region on this exam. Attention at follow-up recommended. Electronically Signed   By: Jeannine Boga M.D.   On: 06/07/2018 22:12    Procedures Procedures (including critical care time)  Medications Ordered in ED Medications  tetracaine (PONTOCAINE) 0.5 % ophthalmic solution 2 drop (2 drops Both Eyes Given 06/07/18 1615)  sodium chloride 0.9 % bolus 500 mL (0 mLs Intravenous Stopped 06/07/18 1820)  acetaminophen (TYLENOL) tablet 1,000 mg (1,000 mg Oral Given 06/07/18 1735)      Initial Impression / Assessment and Plan / ED Course  I have reviewed the triage vital signs and the nursing notes.  Pertinent labs & imaging results that were available during my care of the patient were reviewed by me and considered in my medical decision making (see chart for details).  Clinical Course as of Jun 07 2244  Nancy Fetter Jun 07, 2018  1749 Patient remains alert and appropriate.  He is getting fluids infused and has had Tylenol.  He reports he still getting a intense sharp headache when he walks.  Neurologic exam intact.  Movements are coordinated purposeful.   [MP]    Clinical Course User Index [MP] Charlesetta Shanks, MD   Consult: Reviewed with Dr. Arnoldo Morale.  Advises that at this time without any deficits and small size of potential aneurysm identified on MRI, patient would not be an interventional candidate.  This would be appropriate for outpatient observation.  Patient presents as outlined above with a headache that was severe behind his eyes.  He does not have increased intraocular pressure.  Pupils are symmetric.  He does not have other neurologic deficit.  He shows no signs of infectious etiology.  MRI does show a aneurysm of 5 mm.  Radiology read this is equivocal due to some focal artifact.  I do feel this is consistent with the patient's presentation.  He is however in excellent condition.  Headache is significantly abated he has not had vomiting, confusion or incoordination.  He is ambulating appropriately with clear mental status.  The case has been reviewed with neurosurgery and risk of any intervention would not outweigh benefit at this time.  Plan of outpatient observation of follow-up reviewed with patient and family.  They will continue to try Tylenol with pain with Vicodin to take if needed.  Final Clinical Impressions(s) / ED Diagnoses   Final diagnoses:  Bad headache  Blurred vision    ED Discharge Orders         Ordered    HYDROcodone-acetaminophen  (NORCO/VICODIN) 5-325 MG tablet  Every 6 hours PRN     06/07/18 2241           Charlesetta Shanks, MD 06/07/18 2248

## 2018-06-07 NOTE — ED Triage Notes (Signed)
Pt. Stated, I started having the worse headache yesterday morning and now its just a dull pain. And ive had some blurred vision . The pain starts behind my eyes.

## 2018-06-07 NOTE — ED Notes (Signed)
Taken to MRI at this time

## 2018-06-07 NOTE — ED Triage Notes (Signed)
PT reports starting to have a sever HA with blurred vision ., Pt reports lt leg weakness and unable to stand. Pt reports the blurred vision started on Sat. At the same time the leg weakness started. Pt able to read the purple cards in stroke folder.

## 2018-06-07 NOTE — ED Notes (Signed)
Called lab and added on Sed rate.

## 2018-06-07 NOTE — ED Notes (Signed)
Urine at bedside. 

## 2018-06-10 DIAGNOSIS — I671 Cerebral aneurysm, nonruptured: Secondary | ICD-10-CM | POA: Diagnosis not present

## 2018-06-17 DIAGNOSIS — E119 Type 2 diabetes mellitus without complications: Secondary | ICD-10-CM | POA: Diagnosis not present

## 2018-06-17 DIAGNOSIS — H353132 Nonexudative age-related macular degeneration, bilateral, intermediate dry stage: Secondary | ICD-10-CM | POA: Diagnosis not present

## 2018-06-17 DIAGNOSIS — H5315 Visual distortions of shape and size: Secondary | ICD-10-CM | POA: Diagnosis not present

## 2018-06-17 DIAGNOSIS — G43B0 Ophthalmoplegic migraine, not intractable: Secondary | ICD-10-CM | POA: Diagnosis not present

## 2018-06-17 DIAGNOSIS — Z961 Presence of intraocular lens: Secondary | ICD-10-CM | POA: Diagnosis not present

## 2018-06-17 LAB — HM DIABETES EYE EXAM

## 2018-07-02 ENCOUNTER — Encounter (INDEPENDENT_AMBULATORY_CARE_PROVIDER_SITE_OTHER): Payer: Commercial Managed Care - HMO | Admitting: Ophthalmology

## 2018-07-02 DIAGNOSIS — E11319 Type 2 diabetes mellitus with unspecified diabetic retinopathy without macular edema: Secondary | ICD-10-CM | POA: Diagnosis not present

## 2018-07-02 DIAGNOSIS — E113293 Type 2 diabetes mellitus with mild nonproliferative diabetic retinopathy without macular edema, bilateral: Secondary | ICD-10-CM | POA: Diagnosis not present

## 2018-07-02 DIAGNOSIS — H353132 Nonexudative age-related macular degeneration, bilateral, intermediate dry stage: Secondary | ICD-10-CM | POA: Diagnosis not present

## 2018-07-02 DIAGNOSIS — H43813 Vitreous degeneration, bilateral: Secondary | ICD-10-CM

## 2018-07-02 DIAGNOSIS — H3562 Retinal hemorrhage, left eye: Secondary | ICD-10-CM

## 2018-07-02 DIAGNOSIS — I10 Essential (primary) hypertension: Secondary | ICD-10-CM

## 2018-07-02 DIAGNOSIS — H35033 Hypertensive retinopathy, bilateral: Secondary | ICD-10-CM | POA: Diagnosis not present

## 2018-07-07 DIAGNOSIS — E1165 Type 2 diabetes mellitus with hyperglycemia: Secondary | ICD-10-CM | POA: Diagnosis not present

## 2018-07-07 DIAGNOSIS — I6521 Occlusion and stenosis of right carotid artery: Secondary | ICD-10-CM | POA: Diagnosis not present

## 2018-07-07 DIAGNOSIS — I25119 Atherosclerotic heart disease of native coronary artery with unspecified angina pectoris: Secondary | ICD-10-CM | POA: Diagnosis not present

## 2018-07-07 DIAGNOSIS — R51 Headache: Secondary | ICD-10-CM | POA: Diagnosis not present

## 2018-07-14 ENCOUNTER — Ambulatory Visit (HOSPITAL_COMMUNITY)
Admission: RE | Admit: 2018-07-14 | Discharge: 2018-07-14 | Disposition: A | Discharge status: Home / Self Care | Payer: Medicare Other | Source: Ambulatory Visit | Attending: Cardiology | Admitting: Cardiology

## 2018-07-14 DIAGNOSIS — I350 Nonrheumatic aortic (valve) stenosis: Secondary | ICD-10-CM

## 2018-07-14 NOTE — Progress Notes (Signed)
*  PRELIMINARY RESULTS* Echocardiogram 2D Echocardiogram has been performed.  Leavy Cella 07/14/2018, 12:33 PM

## 2018-07-29 DIAGNOSIS — I6529 Occlusion and stenosis of unspecified carotid artery: Secondary | ICD-10-CM | POA: Diagnosis not present

## 2018-07-29 DIAGNOSIS — E1165 Type 2 diabetes mellitus with hyperglycemia: Secondary | ICD-10-CM | POA: Diagnosis not present

## 2018-07-29 DIAGNOSIS — I25119 Atherosclerotic heart disease of native coronary artery with unspecified angina pectoris: Secondary | ICD-10-CM | POA: Diagnosis not present

## 2018-07-30 LAB — CBC WITH DIFF/PLATELET
BASO(ABSOLUTE): 0.1
Basophils: 1
Eosinophils Absolute: 1
Eosinophils, %: 11
HCT: 41 (ref 29–41)
Hemoglobin: 14.1
Immature Granulocytes: 0
LYMPH: 43 %
Lymphs Abs: 2.9
MCH: 34.7
MCHC: 34.2
MCV: 102 (ref 76–111)
Monocytes(Absolute): 0.7
Monocytes: 10
Neutro Abs: 2.4
Neutrophils: 35
RBC: 4.06
RDW: 12.1
WBC: 6.8
platelet count: 240

## 2018-07-30 LAB — CMP 10231
ALT: 21 (ref 3–30)
AST: 23
Albumin/Globulin Ratio: 2.3
Albumin: 4.6
Alkaline Phosphatase: 76
BUN/Creatinine Ratio: 10
BUN: 13 (ref 4–21)
Bilirubin: 0.8
Calcium: 9.7
Carbon Dioxide, Total: 25
Chloride: 98
Creat: 1.3
EGFR (African American): 56
EGFR (Non-African Amer.): 48
Globulin, Total: 2
Glucose: 167
Potassium: 4.7
Sodium: 141
Total Protein: 6.6 (ref 6.4–8.2)

## 2018-07-30 LAB — HEMOGLOBIN A1C: Hgb A1c MFr Bld: 8 — AB (ref 4.0–6.0)

## 2018-07-30 LAB — TSH: TSH: 9

## 2018-08-04 DIAGNOSIS — I2510 Atherosclerotic heart disease of native coronary artery without angina pectoris: Secondary | ICD-10-CM | POA: Diagnosis not present

## 2018-08-04 DIAGNOSIS — I6621 Occlusion and stenosis of right posterior cerebral artery: Secondary | ICD-10-CM | POA: Diagnosis not present

## 2018-08-04 DIAGNOSIS — E1169 Type 2 diabetes mellitus with other specified complication: Secondary | ICD-10-CM | POA: Diagnosis not present

## 2018-08-04 DIAGNOSIS — E039 Hypothyroidism, unspecified: Secondary | ICD-10-CM | POA: Diagnosis not present

## 2018-08-28 ENCOUNTER — Ambulatory Visit: Payer: Medicare Other | Admitting: Cardiology

## 2018-08-31 ENCOUNTER — Encounter (INDEPENDENT_AMBULATORY_CARE_PROVIDER_SITE_OTHER): Payer: Commercial Managed Care - HMO | Admitting: Ophthalmology

## 2018-09-09 ENCOUNTER — Telehealth: Payer: Self-pay

## 2018-09-09 NOTE — Telephone Encounter (Signed)
Called pt to discuss upcoming appt. No answer.Left message on pone for pt to return call

## 2018-09-17 ENCOUNTER — Ambulatory Visit: Payer: Medicare Other | Admitting: Cardiology

## 2018-10-29 DIAGNOSIS — E118 Type 2 diabetes mellitus with unspecified complications: Secondary | ICD-10-CM | POA: Diagnosis not present

## 2018-10-29 DIAGNOSIS — E039 Hypothyroidism, unspecified: Secondary | ICD-10-CM | POA: Diagnosis not present

## 2018-10-30 LAB — HEMOGLOBIN A1C: Hgb A1c MFr Bld: 8.2 — AB (ref 4.0–6.0)

## 2018-11-04 DIAGNOSIS — E785 Hyperlipidemia, unspecified: Secondary | ICD-10-CM | POA: Diagnosis not present

## 2018-11-04 DIAGNOSIS — E1165 Type 2 diabetes mellitus with hyperglycemia: Secondary | ICD-10-CM | POA: Diagnosis not present

## 2018-11-04 DIAGNOSIS — I25119 Atherosclerotic heart disease of native coronary artery with unspecified angina pectoris: Secondary | ICD-10-CM | POA: Diagnosis not present

## 2018-11-04 DIAGNOSIS — I6521 Occlusion and stenosis of right carotid artery: Secondary | ICD-10-CM | POA: Diagnosis not present

## 2018-11-12 DIAGNOSIS — H401121 Primary open-angle glaucoma, left eye, mild stage: Secondary | ICD-10-CM | POA: Diagnosis not present

## 2018-11-12 DIAGNOSIS — Z961 Presence of intraocular lens: Secondary | ICD-10-CM | POA: Diagnosis not present

## 2018-11-12 DIAGNOSIS — H353132 Nonexudative age-related macular degeneration, bilateral, intermediate dry stage: Secondary | ICD-10-CM | POA: Diagnosis not present

## 2018-11-12 DIAGNOSIS — H538 Other visual disturbances: Secondary | ICD-10-CM | POA: Diagnosis not present

## 2018-11-12 DIAGNOSIS — E119 Type 2 diabetes mellitus without complications: Secondary | ICD-10-CM | POA: Diagnosis not present

## 2019-01-08 IMAGING — US US CAROTID DUPLEX BILAT
1 series · 13 of 24 positions shown · non-contrast
Comparison: 01/12/2008

CLINICAL DATA: 89-year-old male with a history of carotid stenosis
and known occlusion

EXAM:
BILATERAL CAROTID DUPLEX ULTRASOUND
TECHNIQUE: Gray scale imaging, color Doppler and duplex ultrasound were
performed of bilateral carotid and vertebral arteries in the neck.

[Series 1: us carotid duplex bilat · 0.06mm/px · 13 of 75 slices shown]
[im 1/75]
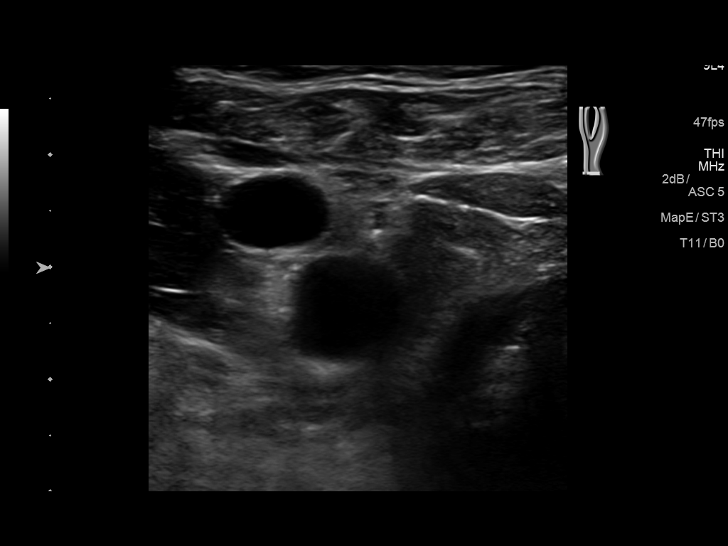
[im 7/75]
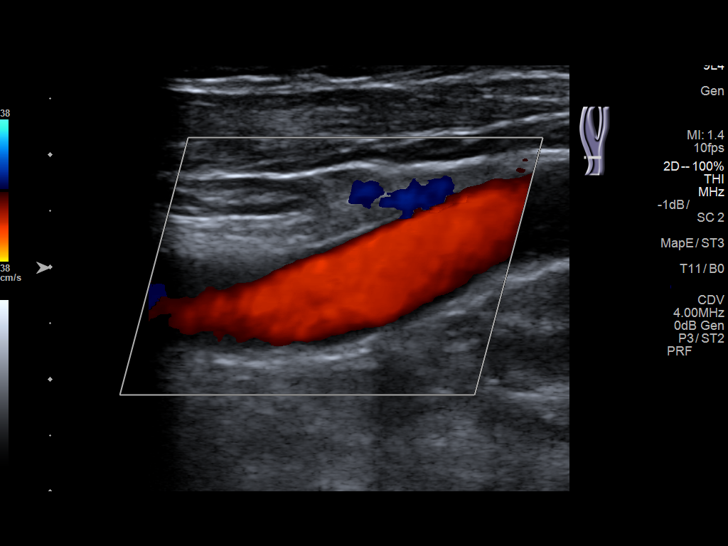
[im 13/75]
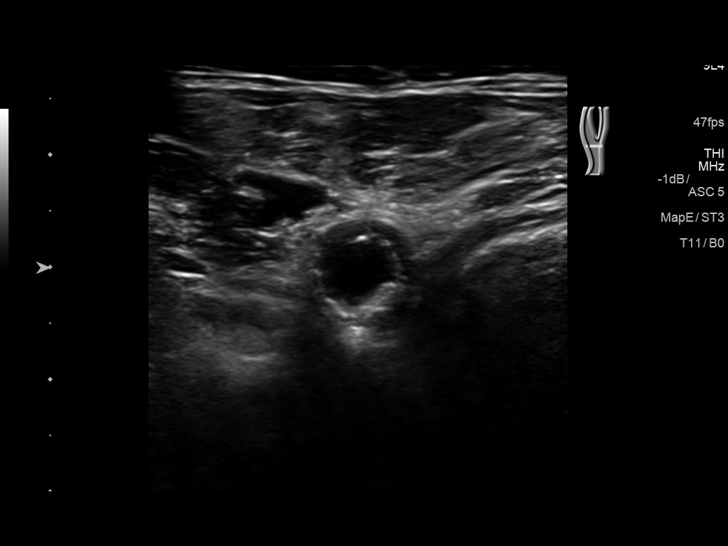
[im 20/75]
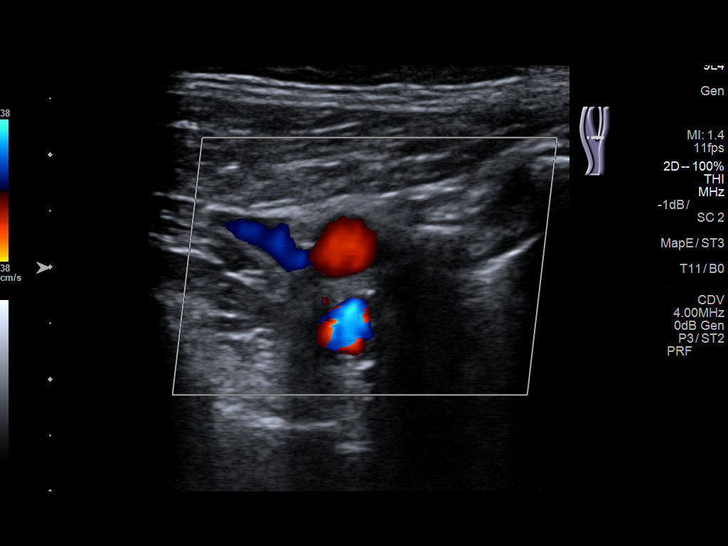
[im 26/75]
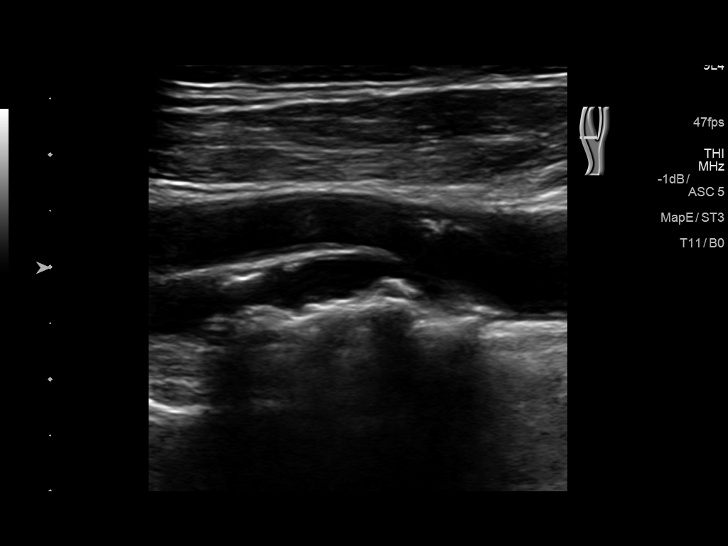
[im 33/75]
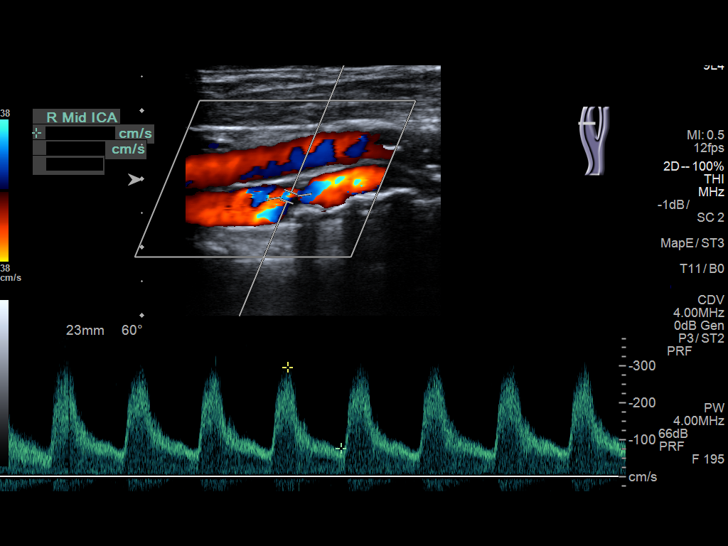
[im 39/75]
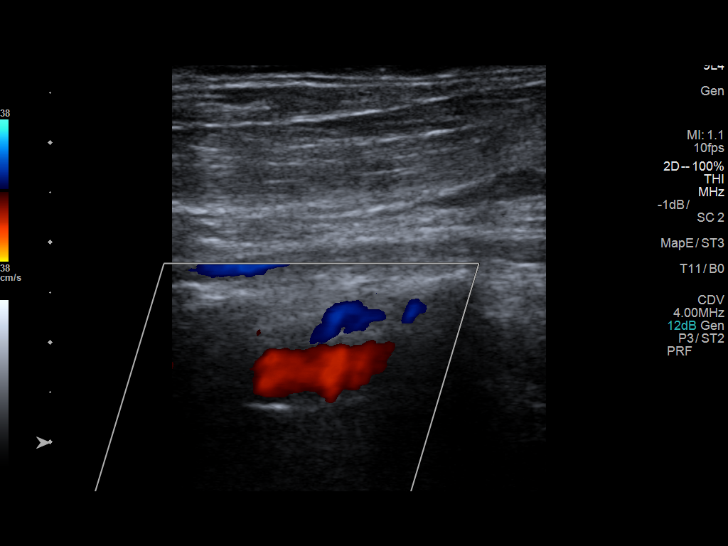
[im 42/75]
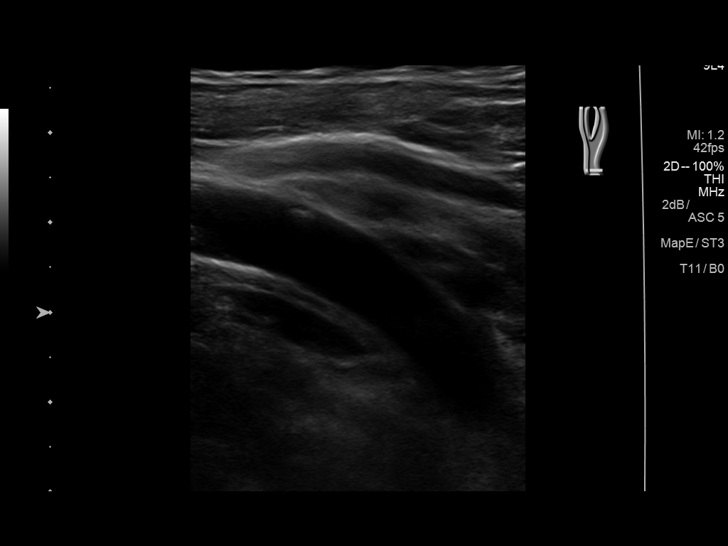
[im 49/75]
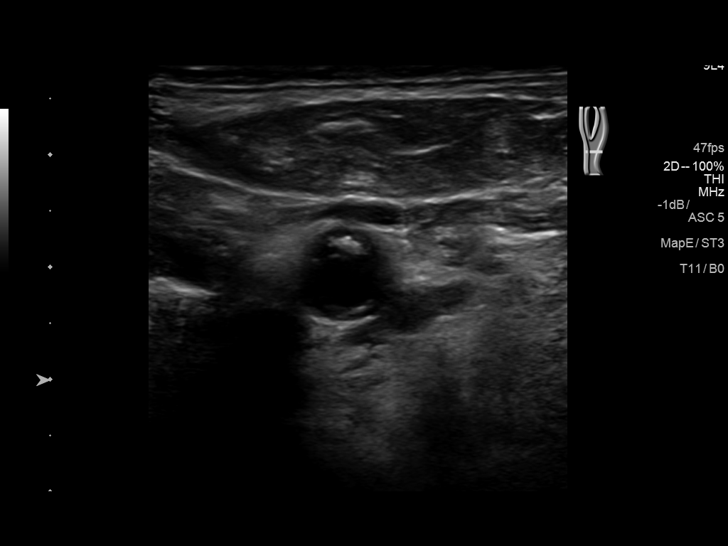
[im 55/75]
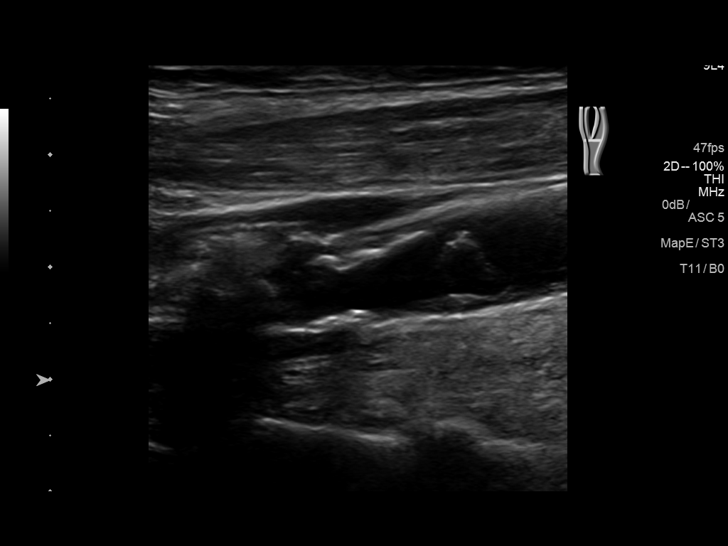
[im 62/75]
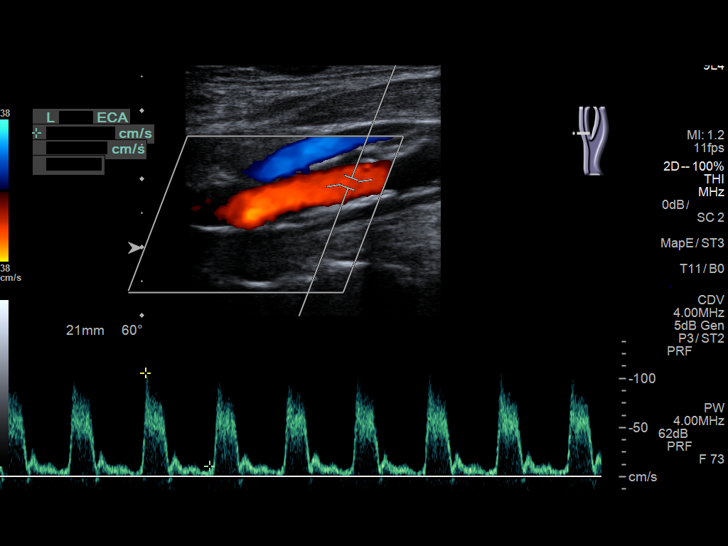
[im 68/75]
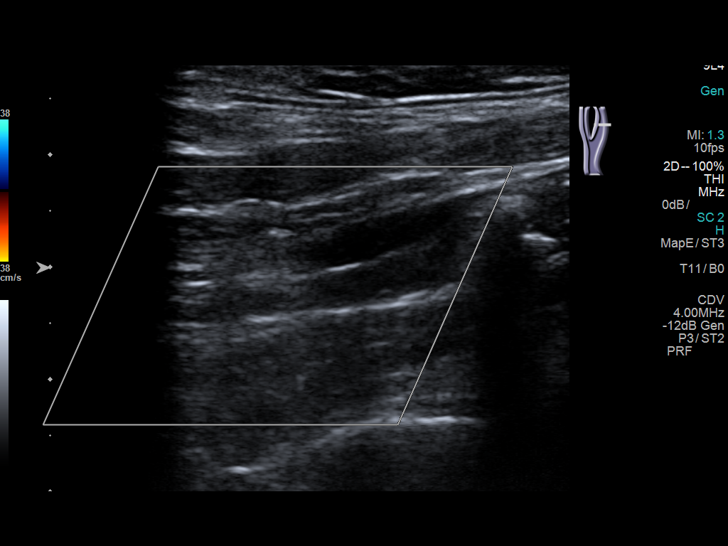
[im 75/75]
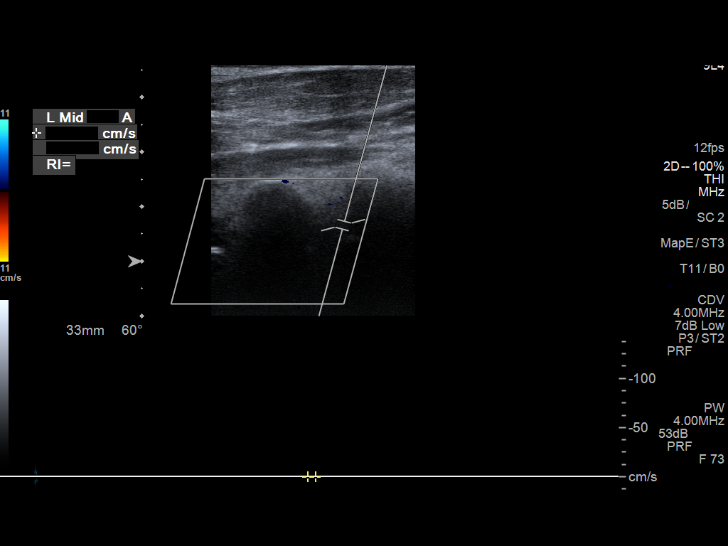

[13 of 24 positions shown; findings below may reference images not displayed]

FINDINGS: Criteria: Quantification of carotid stenosis is based on velocity
parameters that correlate the residual internal carotid diameter
with NASCET-based stenosis levels, using the diameter of the distal
internal carotid lumen as the denominator for stenosis measurement.

The following velocity measurements were obtained:

RIGHT

ICA:  Systolic 318 cm/sec, Diastolic 81 cm/sec

CCA:  102 cm/sec

SYSTOLIC ICA/CCA RATIO:

ECA:  197 cm/sec

LEFT

ICA: Occlusion

CCA:  72 cm/sec

SYSTOLIC ICA/CCA RATIO:  Non calculable

ECA:  109 cm/sec

Right Brachial SBP: Not acquired

Left Brachial SBP: Not acquired

RIGHT CAROTID ARTERY: No significant calcifications of the right
common carotid artery. Intermediate waveform maintained. Moderate
heterogeneous and partially calcified plaque at the right carotid
bifurcation. No significant lumen shadowing. Low resistance waveform
of the right ICA. No significant tortuosity.

RIGHT VERTEBRAL ARTERY: Antegrade flow with low resistance waveform.

LEFT CAROTID ARTERY: Common carotid artery is patent with mild
atherosclerotic changes. Left ICA occlusion.

LEFT VERTEBRAL ARTERY: No flow within the left vertebral artery, new
from the prior
IMPRESSION: Right:

Heterogeneous and partially calcified plaque at the right carotid
bifurcation, with 70%-99% stenosis by established duplex criteria.
This may be overestimated given the contralateral occlusion, and if
there is concern for more precise assessment, formal cerebral
angiogram may be considered.

Left:

Chronic occlusion of the left internal carotid artery.

There appears to be new occlusion of the left vertebral artery,
which was patent in 4555

## 2019-01-18 ENCOUNTER — Other Ambulatory Visit: Payer: Self-pay

## 2019-01-18 ENCOUNTER — Ambulatory Visit (INDEPENDENT_AMBULATORY_CARE_PROVIDER_SITE_OTHER): Payer: Medicare Other | Admitting: Cardiology

## 2019-01-18 ENCOUNTER — Encounter: Payer: Self-pay | Admitting: Cardiology

## 2019-01-18 VITALS — BP 133/74 | HR 78 | Temp 98.0°F | Ht 71.0 in | Wt 207.0 lb

## 2019-01-18 DIAGNOSIS — I350 Nonrheumatic aortic (valve) stenosis: Secondary | ICD-10-CM

## 2019-01-18 DIAGNOSIS — I10 Essential (primary) hypertension: Secondary | ICD-10-CM | POA: Diagnosis not present

## 2019-01-18 DIAGNOSIS — I25119 Atherosclerotic heart disease of native coronary artery with unspecified angina pectoris: Secondary | ICD-10-CM

## 2019-01-18 DIAGNOSIS — I6523 Occlusion and stenosis of bilateral carotid arteries: Secondary | ICD-10-CM | POA: Diagnosis not present

## 2019-01-18 NOTE — Progress Notes (Signed)
Cardiology Office Note  Date: 01/18/2019   ID: William Walker, DOB [birth year], MRN [id number]  PCP:  [name], MD  Cardiologist:  [name], MD Electrophysiologist:  None   Chief Complaint  Patient presents with  . Cardiac follow-up    History of Present Illness:  William Walker is a 85 y.o. male last seen in September 2019.  He is here for a routine visit.  He does not report any progressive shortness of breath with basic ADLs, no exertional chest pain.  Mainly complains of neuropathy.  He uses a cane to ambulate, has had no falls.  Echocardiogram from February of this year revealed LVEF 60 to 65% with overall moderate to severe calcific aortic stenosis.  I discussed the results with him, we will continue surveillance for now.  Last carotid Dopplers and reviewed by Dr. Donnetta Hutching were back in July 2019. He is due for an updated study.  I reviewed his cardiac medications which are outlined below.  He reports no intolerances.  Past Medical History:  Diagnosis Date  . CHF (congestive heart failure) ([place])   . Coronary atherosclerosis of native coronary artery    Reportedly 2 stents - presumably LAD (records not available)  . CVD (cerebrovascular disease)   . Essential hypertension, benign   . GERD (gastroesophageal reflux disease)   . Hyperlipidemia   . MI (myocardial infarction) [facility])    October 1999  . Type 2 diabetes mellitus with diabetic neuropathy [facility])     Past Surgical History:  Procedure Laterality Date  . Arch cerebral ateriogram  12/11/2005   Rosetta Posner MD  . CATARACT EXTRACTION, BILATERAL    . INGUINAL HERNIA REPAIR Left 12/09/2012   Procedure: HERNIA REPAIR INGUINAL with mesh;  Surgeon: [name], MD;  Location: [facility];  Service: General;  Laterality: Left;  . INSERTION OF MESH Left 12/09/2012   Procedure: INSERTION OF MESH;  Surgeon: [name], MD;  Location: [facility];  Service: General;  Laterality: Left;  . RIGHT/LEFT HEART CATH AND CORONARY  ANGIOGRAPHY N/A 10/07/2017   Procedure: RIGHT/LEFT HEART CATH AND CORONARY ANGIOGRAPHY;  Surgeon: [name], MD;  Location: [place] [facility];  Service: Cardiovascular;  Laterality: N/A;    Current Outpatient Medications  Medication Sig Dispense Refill  . Ascorbic Acid (VITAMIN C PO) Take 1 tablet by mouth daily.    Marland Kitchen augmented betamethasone dipropionate (DIPROLENE-AF) 0.05 % cream Apply 1 application topically daily as needed (itching).   5  . furosemide (LASIX) 40 MG tablet Take 40 mg by mouth daily as needed for fluid.     Marland Kitchen glipiZIDE (GLUCOTROL) 5 MG tablet     . HYDROcodone-acetaminophen (NORCO/VICODIN) 5-325 MG tablet Take 1-2 tablets by mouth every 6 (six) hours as needed for moderate pain or severe pain. 20 tablet 0  . lansoprazole (PREVACID) 15 MG capsule Take 15 mg by mouth daily with supper.     . levothyroxine (SYNTHROID) 50 MCG tablet     . lisinopril (PRINIVIL,ZESTRIL) 10 MG tablet Take 1 tablet (10 mg total) by mouth daily. 90 tablet 3  . metoprolol succinate (TOPROL XL) 25 MG 24 hr tablet Take 1 tablet (25 mg total) by mouth daily. 90 tablet 3  . potassium chloride SA (KLOR-CON M20) 20 MEQ tablet Take 20 mEq by mouth daily as needed (with Lasix).     . pravastatin (PRAVACHOL) 40 MG tablet Take 40 mg by mouth at bedtime.      No current  facility-administered medications for this visit.    Allergies:  Penicillins   Social History: The patient  reports that he quit smoking about 70 years ago. His smoking use included pipe and cigars. His smokeless tobacco use includes chew. He reports that he does not drink alcohol or use drugs.   ROS:  Please see the history of present illness. Otherwise, complete review of systems is positive for hearing loss.  All other systems are reviewed and negative.   Physical Exam: VS:  BP 133/74   Pulse 78   Temp 98 F (36.7 C)   Ht 5\' 11"  (1.803 m)   Wt 207 lb (93.9 kg)   BMI 28.87 kg/m , BMI Body mass index is 28.87 kg/m.  Wt  Readings from Last 3 Encounters:  01/18/19 207 lb (93.9 kg)  02/24/18 208 lb (94.3 kg)  12/09/17 210 lb (95.3 kg)    General: Elderly male, appears comfortable at rest. HEENT: Conjunctiva and lids normal, wearing a mask. Neck: Supple, no elevated JVP or carotid bruits, no thyromegaly. Lungs: Clear to auscultation, nonlabored breathing at rest. Cardiac: Regular rate and rhythm, no S3, 3/6 systolic murmur. Abdomen: Soft, nontender, bowel sounds present. Extremities: No pitting edema, distal pulses 2+. Skin: Warm and dry. Musculoskeletal: No kyphosis. Neuropsychiatric: Alert and oriented x3, affect grossly appropriate.  ECG:  An ECG dated 06/07/2018 was personally reviewed today and demonstrated:  Sinus rhythm with prolonged PR interval, leftward axis, increased voltage with repolarization abnormalities.  Recent Labwork: 06/07/2018: ALT 17; AST 21; BUN 15; Creatinine, Ser 1.13; Hemoglobin 13.8; Platelets 217; Potassium 4.0; Sodium 129  August 2019: Hemoglobin 12.8, platelets 264, BUN 16, creatinine 1.17, potassium 5.3, TSH 5.79, hemoglobin A1c 7.6  Other Studies Reviewed Today:  Cardiac catheterization 10/07/2017: 1. Moderate diffuse coronary artery disease with moderate left mainstem stenosis, moderate stenosis of the proximal circumflex, mild nonobstructive stenosis of the LAD and RCA, and moderately severe stenosis of the diagonal origin 2. Continued patency of the stented segment in the LAD 3. Moderate aortic stenosis with a mean transvalvular gradient of 26 mmHg  Recommendations: All available data is reviewed. The patient really does not have any targets for PCI. I suspect a combination of moderate aortic stenosis, moderate coronary artery disease, and significant anemia accounts in combination for his symptoms. His hemoglobin is approximately 8 mg/dL which is down from a normal hemoglobin of 13 mg/dL last year. Advised follow-up with his primary care physician for consideration of  elective blood transfusion and further work-up. I think his cardiac disease can be followed with medical therapy and serial echo follow-up. Findings discussed with Dr. Domenic Polite.  Note if the patient requires repeat catheterization, I would not recommend using the right radial as an access site because of severe right subclavian tortuosity.  Echocardiogram 07/14/2018:  1. The left ventricle has normal systolic function of 98-33%. The cavity size was normal. There is moderate concentric left ventricular hypertrophy. Left ventricular diastolic Doppler parameters are consistent with impaired relaxation. No evidence of  left ventricular regional wall motion abnormalities.  2. The right ventricle has normal systolic function. The cavity was normal. There is no increase in right ventricular wall thickness. Normal estimated RVSP of 28.2 mmHg.  3. The aortic valve is tricuspid There is moderate calcification of the aortic valve. Aortic valve regurgitation is mild by color flow Doppler. There is moderate-severe stenosis with mean gradient 29 mmHg and LVOT/AV VTI 0.26. There is moderate aortic  annular calcification noted.  4. The mitral valve is  normal in structure. There is mild mitral annular calcification present.  5. The tricuspid valve is normal in structure.  6. The aortic root is normal in size and structure.  7. Left atrial size was mildly dilated.  Assessment and Plan:  1.  Moderate to severe calcific aortic stenosis by echocardiography in February.  This was graded as moderate by cardiac catheterization in May of last year.  We will continue with surveillance for now.  2.  Multivessel CAD, moderate range with patent LAD stent site at cardiac catheterization in May of last year.  No active angina at this time. Continue medical therapy.  3.  Bilateral carotid artery disease with follow-up by Dr. Donnetta Hutching.  Repeat carotid Dopplers.  4.  History of iron deficiency anemia with improvement in  hemoglobin to 13.8.  Medication Adjustments/Labs and Tests Ordered: Current medicines are reviewed at length with the patient today.  Concerns regarding medicines are outlined above.   Tests Ordered: Orders Placed This Encounter  Procedures  . ECHOCARDIOGRAM COMPLETE    Medication Changes: No orders of the defined types were placed in this encounter.   Disposition:  Follow up December in the [place] office.  Signed, Satira Sark, MD, [facility] 01/18/2019 4:23 PM    [place] at [facility] [address], [place], [place] [address] Phone: [phone]; Fax: [phone]

## 2019-01-18 NOTE — Patient Instructions (Signed)
Medication Instructions: Your physician recommends that you continue on your current medications as directed. Please refer to the Current Medication list given to you today.   Labwork: none  Procedures/Testing: Your physician has requested that you have a carotid duplex at the [facility] office. This test is an ultrasound of the carotid arteries in your neck. It looks at blood flow through these arteries that supply the brain with blood. Allow one hour for this exam. There are no restrictions or special instructions. Your physician has requested that you have an echocardiogram. Echocardiography is a painless test that uses sound waves to create images of your heart. It provides your doctor with information about the size and shape of your heart and how well your heart's chambers and valves are working. This procedure takes approximately one hour. There are no restrictions for this procedure.    Your physician has requested that you have an echocardiogram [place]. Echocardiography is a painless test that uses sound waves to create images of your heart. It provides your doctor with information about the size and shape of your heart and how well your heart's chambers and valves are working. This procedure takes approximately one hour. There are no restrictions for this procedure.   Follow-Up  December with Dr.McDowell  Any Additional Special Instructions Will Be Listed Below (If Applicable).     If you need a refill on your cardiac medications before your next appointment, please call your pharmacy.

## 2019-01-20 ENCOUNTER — Other Ambulatory Visit: Payer: Self-pay | Admitting: Cardiology

## 2019-01-20 DIAGNOSIS — I359 Nonrheumatic aortic valve disorder, unspecified: Secondary | ICD-10-CM

## 2019-02-03 ENCOUNTER — Other Ambulatory Visit: Payer: Self-pay | Admitting: Occupational Therapy

## 2019-02-03 ENCOUNTER — Ambulatory Visit (INDEPENDENT_AMBULATORY_CARE_PROVIDER_SITE_OTHER): Payer: Medicare Other

## 2019-02-03 ENCOUNTER — Other Ambulatory Visit: Payer: Self-pay

## 2019-02-03 ENCOUNTER — Other Ambulatory Visit: Payer: Self-pay | Admitting: Cardiology

## 2019-02-03 DIAGNOSIS — I6523 Occlusion and stenosis of bilateral carotid arteries: Secondary | ICD-10-CM | POA: Diagnosis not present

## 2019-02-11 DIAGNOSIS — I2510 Atherosclerotic heart disease of native coronary artery without angina pectoris: Secondary | ICD-10-CM | POA: Diagnosis not present

## 2019-02-11 DIAGNOSIS — E1169 Type 2 diabetes mellitus with other specified complication: Secondary | ICD-10-CM | POA: Diagnosis not present

## 2019-02-11 DIAGNOSIS — Z0000 Encounter for general adult medical examination without abnormal findings: Secondary | ICD-10-CM | POA: Diagnosis not present

## 2019-02-11 DIAGNOSIS — Z23 Encounter for immunization: Secondary | ICD-10-CM | POA: Diagnosis not present

## 2019-02-11 DIAGNOSIS — E785 Hyperlipidemia, unspecified: Secondary | ICD-10-CM | POA: Diagnosis not present

## 2019-02-11 DIAGNOSIS — I119 Hypertensive heart disease without heart failure: Secondary | ICD-10-CM | POA: Diagnosis not present

## 2019-02-12 LAB — CBC WITH DIFF/PLATELET
BASO(ABSOLUTE): 0.1
Basophils: 1
Eosinophils Absolute: 1
Eosinophils, %: 8
HCT: 44 — AB (ref 29–41)
Hemoglobin: 14.6
Immature Granulocytes: 0
Lymphocytes: 38
Lymphs Abs: 2.5
MCH: 34.6
MCHC: 33.3
MCV: 104 (ref 76–111)
Monocytes(Absolute): 0.5
Monocytes: 7
Neutro Abs: 3.1
Neutrophils: 46
RBC: 4.22
RDW: 12.7
WBC: 6.6
platelet count: 210

## 2019-02-12 LAB — HEMOGLOBIN A1C: Hgb A1c MFr Bld: 6.8 — AB (ref 4.0–6.0)

## 2019-02-12 LAB — TSH: TSH: 2.57

## 2019-02-15 DIAGNOSIS — N390 Urinary tract infection, site not specified: Secondary | ICD-10-CM | POA: Diagnosis not present

## 2019-05-04 ENCOUNTER — Other Ambulatory Visit: Payer: Self-pay

## 2019-05-04 ENCOUNTER — Ambulatory Visit (HOSPITAL_COMMUNITY)
Admission: RE | Admit: 2019-05-04 | Discharge: 2019-05-04 | Disposition: A | Discharge status: Home / Self Care | Payer: Medicare Other | Source: Ambulatory Visit | Attending: Cardiology | Admitting: Cardiology

## 2019-05-04 DIAGNOSIS — I359 Nonrheumatic aortic valve disorder, unspecified: Secondary | ICD-10-CM | POA: Diagnosis not present

## 2019-05-04 NOTE — Progress Notes (Signed)
*  PRELIMINARY RESULTS* Echocardiogram 2D Echocardiogram has been performed.  Samuel Germany 05/04/2019, 12:57 PM

## 2019-05-05 ENCOUNTER — Ambulatory Visit: Payer: Medicare Other | Admitting: Cardiology

## 2019-05-10 ENCOUNTER — Telehealth (INDEPENDENT_AMBULATORY_CARE_PROVIDER_SITE_OTHER): Payer: Medicare Other | Admitting: Cardiology

## 2019-05-10 ENCOUNTER — Encounter: Payer: Self-pay | Admitting: Cardiology

## 2019-05-10 VITALS — BP 134/62 | HR 65 | Ht 71.0 in | Wt 200.0 lb

## 2019-05-10 DIAGNOSIS — I6523 Occlusion and stenosis of bilateral carotid arteries: Secondary | ICD-10-CM

## 2019-05-10 DIAGNOSIS — I10 Essential (primary) hypertension: Secondary | ICD-10-CM

## 2019-05-10 DIAGNOSIS — I350 Nonrheumatic aortic (valve) stenosis: Secondary | ICD-10-CM | POA: Diagnosis not present

## 2019-05-10 DIAGNOSIS — I25119 Atherosclerotic heart disease of native coronary artery with unspecified angina pectoris: Secondary | ICD-10-CM | POA: Diagnosis not present

## 2019-05-10 DIAGNOSIS — K219 Gastro-esophageal reflux disease without esophagitis: Secondary | ICD-10-CM | POA: Diagnosis not present

## 2019-05-10 MED ORDER — ISOSORBIDE MONONITRATE ER 30 MG PO TB24: 15.0000 mg | ORAL_TABLET | Freq: Every evening | ORAL | 3 refills | Status: DC

## 2019-05-10 NOTE — Patient Instructions (Signed)
Medication Instructions:  START Imdur 15 mg every  Evening  *If you need a refill on your cardiac medications before your next appointment, please call your pharmacy*  Lab Work: None today If you have labs (blood work) drawn today and your tests are completely normal, you will receive your results only by: Marland Kitchen MyChart Message (if you have MyChart) OR . A paper copy in the mail If you have any lab test that is abnormal or we need to change your treatment, we will call you to review the results.  Testing/Procedures: None today  Follow-Up: At [facility], you and your health needs are our priority.  As part of our continuing mission to provide you with exceptional heart care, we have created designated Provider Care Teams.  These Care Teams include your primary Cardiologist (physician) and Advanced Practice Providers (APPs -  Physician Assistants and Nurse Practitioners) who all work together to provide you with the care you need, when you need it.  Your next appointment:   4 month(s)  The format for your next appointment:   In Person  Provider:   [name], MD  Other Instructions None     Thank you for choosing [facility] !

## 2019-05-10 NOTE — Progress Notes (Signed)
Virtual Visit via Telephone Note   This visit type was conducted due to national recommendations for restrictions regarding the COVID-19 Pandemic (e.g. social distancing) in an effort to limit this patient's exposure and mitigate transmission in our community.  Due to his co-morbid illnesses, this patient is at least at moderate risk for complications without adequate follow up.  This format is felt to be most appropriate for this patient at this time.  The patient did not have access to video technology/had technical difficulties with video requiring transitioning to audio format only (telephone).  All issues noted in this document were discussed and addressed.  No physical exam could be performed with this format.  Please refer to the patient's chart for his  consent to telehealth for [facility].   Date:  05/10/2019   ID:  ERSKIN FEDDER, DOB December 16, 1926, MRN [id number]  Patient Location: Home Provider Location: Home  PCP:  [name], MD  Cardiologist:  [name], MD  Electrophysiologist:  None   Evaluation Performed:  Follow-Up Visit  Chief Complaint:  Cardiac follow-up  History of Present Illness:    William Walker is a 85 y.o. male last seen in August.  We spoke by phone today.  I also talked with his daughter.  He has been staying around the house mostly during the pandemic, tries to do some yard work but has had a hard time not being able to get out as much.  Also having trouble with his memory.   Recent echocardiogram showed LVEF 55 to 60% with moderate LVH and mild diastolic dysfunction.  Aortic valve showed evidence of severe stenosis with mean gradient 38 mmHg and dimensionless index 0.21.  This is noted with moderate aortic regurgitation however.  Aortic stenosis was felt to be moderate by cardiac catheterization last year.  We went over the results today.  I reviewed his medications which are outlined below.  He has been having some reflux and indigestion symptoms,  started back on Prevacid.  We also discussed starting on low-dose Imdur 15 mg daily.  Follow-up carotid Dopplers in September showed occlusion of the left ICA with moderate 40 to 59% RICA stenosis.  The patient does not have symptoms concerning for COVID-19 infection (fever, chills, cough, or new shortness of breath).    Past Medical History:  Diagnosis Date  . Aortic stenosis   . Coronary atherosclerosis of native coronary artery    Reportedly 2 stents - presumably LAD (records not available)  . CVD (cerebrovascular disease)   . Essential hypertension   . GERD (gastroesophageal reflux disease)   . Hyperlipidemia   . MI (myocardial infarction) [facility])    October 1999  . Type 2 diabetes mellitus with diabetic neuropathy [facility])    Past Surgical History:  Procedure Laterality Date  . Arch cerebral ateriogram  12/11/2005   Rosetta Posner MD  . CATARACT EXTRACTION, BILATERAL    . INGUINAL HERNIA REPAIR Left 12/09/2012   Procedure: HERNIA REPAIR INGUINAL with mesh;  Surgeon: [name], MD;  Location: [facility];  Service: General;  Laterality: Left;  . INSERTION OF MESH Left 12/09/2012   Procedure: INSERTION OF MESH;  Surgeon: [name], MD;  Location: [facility];  Service: General;  Laterality: Left;  . RIGHT/LEFT HEART CATH AND CORONARY ANGIOGRAPHY N/A 10/07/2017   Procedure: RIGHT/LEFT HEART CATH AND CORONARY ANGIOGRAPHY;  Surgeon: [name], MD;  Location: [place] CV LAB;  Service: Cardiovascular;  Laterality: N/A;     Current  Meds  Medication Sig  . Ascorbic Acid (VITAMIN C PO) Take 1 tablet by mouth daily.  Marland Kitchen aspirin EC 81 MG tablet Take 81 mg by mouth daily.  Marland Kitchen augmented betamethasone dipropionate (DIPROLENE-AF) 0.05 % cream Apply 1 application topically daily as needed (itching).   . furosemide (LASIX) 40 MG tablet Take 40 mg by mouth daily as needed for fluid.   Marland Kitchen glipiZIDE (GLUCOTROL) 5 MG tablet   . lansoprazole (PREVACID) 15 MG capsule Take 15 mg by mouth daily with  supper.   . levothyroxine (SYNTHROID) 50 MCG tablet   . lisinopril (PRINIVIL,ZESTRIL) 10 MG tablet Take 1 tablet (10 mg total) by mouth daily.  . metoprolol succinate (TOPROL XL) 25 MG 24 hr tablet Take 1 tablet (25 mg total) by mouth daily.  . nitroGLYCERIN (NITROSTAT) 0.4 MG SL tablet Place 0.4 mg under the tongue every 5 (five) minutes as needed for chest pain.  . potassium chloride SA (KLOR-CON M20) 20 MEQ tablet Take 20 mEq by mouth daily as needed (with Lasix).   . pravastatin (PRAVACHOL) 40 MG tablet Take 40 mg by mouth at bedtime.      Allergies:   Penicillins   Social History   Tobacco Use  . Smoking status: Former Smoker    Types: Pipe, Cigars    Quit date: 12/03/1948    Years since quitting: 70.4  . Smokeless tobacco: Current User    Types: Chew  . Tobacco comment: has been chewing tobacco for 60-70 years  Substance Use Topics  . Alcohol use: No  . Drug use: No     Family Hx: The patient's family history includes Coronary artery disease in his father.  ROS:   Please see the history of present illness. All other systems reviewed and are negative.   Prior CV studies:   The following studies were reviewed today:  Cardiac catheterization 10/07/2017: 1. Moderate diffuse coronary artery disease with moderate left mainstem stenosis, moderate stenosis of the proximal circumflex, mild nonobstructive stenosis of the LAD and RCA, and moderately severe stenosis of the diagonal origin 2. Continued patency of the stented segment in the LAD 3. Moderate aortic stenosis with a mean transvalvular gradient of 26 mmHg  Recommendations: All available data is reviewed. The patient really does not have any targets for PCI. I suspect a combination of moderate aortic stenosis, moderate coronary artery disease, and significant anemia accounts in combination for his symptoms. His hemoglobin is approximately 8 mg/dL which is down from a normal hemoglobin of 13 mg/dL last year. Advised  follow-up with his primary care physician for consideration of elective blood transfusion and further work-up. I think his cardiac disease can be followed with medical therapy and serial echo follow-up. Findings discussed with Dr. Domenic Polite.  Note if the patient requires repeat catheterization, I would not recommend using the right radial as an access site because of severe right subclavian tortuosity.  Echocardiogram 05/04/2019: 1. Left ventricular ejection fraction, by visual estimation, is 55 to 60%. The left ventricle has normal function. There is moderately increased left ventricular hypertrophy.  2. Left ventricular diastolic parameters are consistent with Grade I diastolic dysfunction (impaired relaxation).  3. Global right ventricle has normal systolic function.The right ventricular size is normal. No increase in right ventricular wall thickness.  4. Left atrial size was severely dilated.  5. Right atrial size was moderately dilated.  6. Moderate aortic valve annular calcification.  7. The mitral valve is normal in structure. No evidence of mitral valve regurgitation. No  evidence of mitral stenosis.  8. The tricuspid valve is normal in structure. Tricuspid valve regurgitation is mild.  9. Aortic valve regurgitation is moderate. 10. The aortic valve is tricuspid. Aortic valve regurgitation is moderate. Severe aortic valve stenosis. Severe by AVA VTI and dimensionless index.Mean gradient (38 mmHg) essentially consistent with severe AS given other parameters 11. There is Moderate calcification of the aortic valve. 12. There is Moderate thickening of the aortic valve. 13. The pulmonic valve was not well visualized. Pulmonic valve regurgitation is mild. 14. Normal pulmonary artery systolic pressure. 15. The inferior vena cava is normal in size with greater than 50% respiratory variability, suggesting right atrial pressure of 3 mmHg.  Carotid Dopplers 02/03/2019: Summary: Right Carotid:  Velocities in the right ICA are consistent with a 40-59%                stenosis. The ECA appears >50% stenosed.  Left Carotid: Evidence consistent with a total occlusion of the left ICA.  Vertebrals:  Right vertebral artery demonstrates antegrade flow. Left vertebral              artery demonstrates an occlusion. Subclavians: Normal flow hemodynamics were seen in bilateral subclavian              arteries.  Labs/Other Tests and Data Reviewed:    EKG:  An ECG dated 06/07/2018 was personally reviewed today and demonstrated:  Sinus rhythm with prolonged PR interval, leftward axis, increased voltage with repolarization abnormalities.  Recent Labs: 06/07/2018: ALT 17; BUN 15; Creatinine, Ser 1.13; Hemoglobin 13.8; Platelets 217; Potassium 4.0; Sodium 129    Wt Readings from Last 3 Encounters:  05/10/19 200 lb (90.7 kg)  01/18/19 207 lb (93.9 kg)  02/24/18 208 lb (94.3 kg)     Objective:    Vital Signs:  BP 134/62   Pulse 65   Ht 5\' 11"  (1.803 m)   Wt 200 lb (90.7 kg)   BMI 27.89 kg/m    Patient spoke in full sentences, not short of breath. No audible wheezing or coughing. Speech pattern normal.  ASSESSMENT & PLAN:    1.  Calcific aortic stenosis, severe range by recent echocardiogram but otherwise clinically stable.  He underwent cardiac catheterization last year as discussed above.  We will continue with observation for now.  TAVR still may be an option if absolutely necessary.  2.  Multivessel CAD, moderate with patent LAD stent site by cardiac catheterization last year.  Plan to continue medical therapy with addition of Imdur 15 mg once daily.  He has had intermittent angina symptoms.  3.  Bilateral carotid artery disease with recent carotid Dopplers showing occluded LICA and moderately stenosed RICA.  Continue aspirin and statin.   4.  GERD, continues on Prevacid.  COVID-19 Education: The signs and symptoms of COVID-19 were discussed with the patient and how to seek care  for testing (follow up with PCP or arrange E-visit). The importance of social distancing was discussed today.  Time:   Today, I have spent 10 minutes with the patient with telehealth technology discussing the above problems.     Medication Adjustments/Labs and Tests Ordered: Current medicines are reviewed at length with the patient today.  Concerns regarding medicines are outlined above.   Tests Ordered: No orders of the defined types were placed in this encounter.   Medication Changes: Meds ordered this encounter  Medications  . isosorbide mononitrate (IMDUR) 30 MG 24 hr tablet    Sig: Take 0.5 tablets (15  mg total) by mouth every evening.    Dispense:  90 tablet    Refill:  3    Follow Up:  In Person 4 months in the [place] office.  Signed, [name], MD  05/10/2019 9:48 AM    [facility]

## 2019-05-13 DIAGNOSIS — I2510 Atherosclerotic heart disease of native coronary artery without angina pectoris: Secondary | ICD-10-CM | POA: Diagnosis not present

## 2019-05-13 DIAGNOSIS — I10 Essential (primary) hypertension: Secondary | ICD-10-CM | POA: Diagnosis not present

## 2019-05-13 DIAGNOSIS — E1165 Type 2 diabetes mellitus with hyperglycemia: Secondary | ICD-10-CM | POA: Diagnosis not present

## 2019-05-13 DIAGNOSIS — K219 Gastro-esophageal reflux disease without esophagitis: Secondary | ICD-10-CM | POA: Diagnosis not present

## 2019-05-17 DIAGNOSIS — L720 Epidermal cyst: Secondary | ICD-10-CM | POA: Diagnosis not present

## 2019-05-17 DIAGNOSIS — L570 Actinic keratosis: Secondary | ICD-10-CM | POA: Diagnosis not present

## 2019-05-17 DIAGNOSIS — X32XXXA Exposure to sunlight, initial encounter: Secondary | ICD-10-CM | POA: Diagnosis not present

## 2019-05-17 DIAGNOSIS — L308 Other specified dermatitis: Secondary | ICD-10-CM | POA: Diagnosis not present

## 2019-05-17 DIAGNOSIS — B86 Scabies: Secondary | ICD-10-CM | POA: Diagnosis not present

## 2019-05-17 DIAGNOSIS — S60451A Superficial foreign body of left index finger, initial encounter: Secondary | ICD-10-CM | POA: Diagnosis not present

## 2019-07-30 DIAGNOSIS — E119 Type 2 diabetes mellitus without complications: Secondary | ICD-10-CM | POA: Diagnosis not present

## 2019-08-05 DIAGNOSIS — L570 Actinic keratosis: Secondary | ICD-10-CM | POA: Diagnosis not present

## 2019-08-05 DIAGNOSIS — X32XXXD Exposure to sunlight, subsequent encounter: Secondary | ICD-10-CM | POA: Diagnosis not present

## 2019-08-05 DIAGNOSIS — L308 Other specified dermatitis: Secondary | ICD-10-CM | POA: Diagnosis not present

## 2019-08-16 DIAGNOSIS — Z1389 Encounter for screening for other disorder: Secondary | ICD-10-CM | POA: Diagnosis not present

## 2019-08-16 DIAGNOSIS — E119 Type 2 diabetes mellitus without complications: Secondary | ICD-10-CM | POA: Diagnosis not present

## 2019-08-16 DIAGNOSIS — E7849 Other hyperlipidemia: Secondary | ICD-10-CM | POA: Diagnosis not present

## 2019-08-16 DIAGNOSIS — Z0000 Encounter for general adult medical examination without abnormal findings: Secondary | ICD-10-CM | POA: Diagnosis not present

## 2019-09-13 ENCOUNTER — Other Ambulatory Visit (HOSPITAL_COMMUNITY)
Admission: RE | Admit: 2019-09-13 | Discharge: 2019-09-13 | Disposition: A | Discharge status: Home / Self Care | Payer: Medicare Other | Source: Ambulatory Visit | Attending: Cardiology | Admitting: Cardiology

## 2019-09-13 ENCOUNTER — Ambulatory Visit: Payer: Medicare Other | Admitting: Cardiology

## 2019-09-13 ENCOUNTER — Encounter: Payer: Self-pay | Admitting: Cardiology

## 2019-09-13 ENCOUNTER — Other Ambulatory Visit: Payer: Self-pay

## 2019-09-13 VITALS — BP 146/78 | HR 80 | Wt 214.6 lb

## 2019-09-13 DIAGNOSIS — I25119 Atherosclerotic heart disease of native coronary artery with unspecified angina pectoris: Secondary | ICD-10-CM

## 2019-09-13 DIAGNOSIS — I10 Essential (primary) hypertension: Secondary | ICD-10-CM

## 2019-09-13 DIAGNOSIS — I350 Nonrheumatic aortic (valve) stenosis: Secondary | ICD-10-CM

## 2019-09-13 LAB — BASIC METABOLIC PANEL
Anion gap: 8 (ref 5–15)
BUN: 21 mg/dL (ref 8–23)
CO2: 27 mmol/L (ref 22–32)
Calcium: 9.2 mg/dL (ref 8.9–10.3)
Chloride: 101 mmol/L (ref 98–111)
Creatinine, Ser: 1.19 mg/dL (ref 0.61–1.24)
GFR calc Af Amer: >60 mL/min (ref >60)
GFR calc non Af Amer: 53 mL/min — ABNORMAL LOW (ref >60)
Glucose, Bld: 107 mg/dL — ABNORMAL HIGH (ref 70–99)
Potassium: 4.5 mmol/L (ref 3.5–5.1)
Sodium: 136 mmol/L (ref 135–145)

## 2019-09-13 MED ORDER — ISOSORBIDE MONONITRATE ER 30 MG PO TB24: 15.0000 mg | ORAL_TABLET | Freq: Two times a day (BID) | ORAL | 3 refills | Status: DC

## 2019-09-13 MED ORDER — NITROGLYCERIN 0.4 MG SL SUBL: 0.4000 mg | SUBLINGUAL_TABLET | SUBLINGUAL | 3 refills | Status: DC | PRN

## 2019-09-13 NOTE — Patient Instructions (Signed)
Medication Instructions:   Increase Imdur to 15 mg- two times daily   Labwork: today bmet  Testing/Procedures: Your physician has requested that you have an echocardiogram. Echocardiography is a painless test that uses sound waves to create images of your heart. It provides your doctor with information about the size and shape of your heart and how well your heart's chambers and valves are working. This procedure takes approximately one hour. There are no restrictions for this procedure.    Follow-Up: Your physician recommends that you schedule a follow-up appointment in: June 2021   Any Other Special Instructions Will Be Listed Below (If Applicable).     If you need a refill on your cardiac medications before your next appointment, please call your pharmacy.

## 2019-09-13 NOTE — Progress Notes (Signed)
Cardiology Office Note  Date: 09/13/2019   ID: William SERRATORE, DOB 1927/01/16, MRN [id number]  PCP:  [name], MD  Cardiologist:  [name], MD Electrophysiologist:  None   Chief Complaint  Patient presents with  . Cardiac follow-up    History of Present Illness: William Walker is a 86 y.o. male last assessed via telehealth encounter in December 2020.  He is here today with his daughter for a follow-up visit.  He reports increasing episodes of angina and nitroglycerin use with activities around the house.  He has also been having more leg swelling.  I reviewed his medications which are outlined below.  We discussed increasing Imdur to 15 mg twice daily, also most likely putting him on more of a standing dose of Lasix.  He does need a follow-up BMET first.  I personally reviewed his ECG today which shows sinus rhythm with prolonged PR interval, increased voltage.  He will be due for a follow-up echocardiogram in June.  Past Medical History:  Diagnosis Date  . Aortic stenosis   . Coronary atherosclerosis of native coronary artery    Reportedly 2 stents - presumably LAD (records not available)  . CVD (cerebrovascular disease)   . Essential hypertension   . GERD (gastroesophageal reflux disease)   . Hyperlipidemia   . MI (myocardial infarction) [facility])    October 1999  . Type 2 diabetes mellitus with diabetic neuropathy [facility])     Past Surgical History:  Procedure Laterality Date  . Arch cerebral ateriogram  12/11/2005   Rosetta Posner MD  . CATARACT EXTRACTION, BILATERAL    . INGUINAL HERNIA REPAIR Left 12/09/2012   Procedure: HERNIA REPAIR INGUINAL with mesh;  Surgeon: [name], MD;  Location: [facility];  Service: General;  Laterality: Left;  . INSERTION OF MESH Left 12/09/2012   Procedure: INSERTION OF MESH;  Surgeon: [name], MD;  Location: [facility];  Service: General;  Laterality: Left;  . RIGHT/LEFT HEART CATH AND CORONARY ANGIOGRAPHY N/A 10/07/2017    Procedure: RIGHT/LEFT HEART CATH AND CORONARY ANGIOGRAPHY;  Surgeon: [name], MD;  Location: [place] CV LAB;  Service: Cardiovascular;  Laterality: N/A;    Current Outpatient Medications  Medication Sig Dispense Refill  . Ascorbic Acid (VITAMIN C PO) Take 1 tablet by mouth daily.    Marland Kitchen aspirin EC 81 MG tablet Take 81 mg by mouth daily.    Marland Kitchen augmented betamethasone dipropionate (DIPROLENE-AF) 0.05 % cream Apply 1 application topically daily as needed (itching).   5  . furosemide (LASIX) 40 MG tablet Take 40 mg by mouth daily as needed for fluid.     Marland Kitchen glipiZIDE (GLUCOTROL) 5 MG tablet     . lansoprazole (PREVACID) 15 MG capsule Take 15 mg by mouth daily with supper.     . levothyroxine (SYNTHROID) 50 MCG tablet     . metoprolol succinate (TOPROL XL) 25 MG 24 hr tablet Take 1 tablet (25 mg total) by mouth daily. 90 tablet 3  . nitroGLYCERIN (NITROSTAT) 0.4 MG SL tablet Place 1 tablet (0.4 mg total) under the tongue every 5 (five) minutes as needed for chest pain. 25 tablet 3  . potassium chloride SA (KLOR-CON M20) 20 MEQ tablet Take 20 mEq by mouth daily as needed (with Lasix).     . pravastatin (PRAVACHOL) 40 MG tablet Take 40 mg by mouth at bedtime.     . isosorbide mononitrate (IMDUR) 30 MG 24 hr tablet Take 0.5 tablets (15  mg total) by mouth in the morning and at bedtime. 90 tablet 3  . lisinopril (PRINIVIL,ZESTRIL) 10 MG tablet Take 1 tablet (10 mg total) by mouth daily. 90 tablet 3   No current facility-administered medications for this visit.   Allergies:  Penicillins   ROS:   Hearing loss.  Knee pain.  Uses a cane.  Physical Exam: VS:  BP (!) 146/78   Pulse 80   Wt 214 lb 9.6 oz (97.3 kg)   BMI 29.93 kg/m , BMI Body mass index is 29.93 kg/m.  Wt Readings from Last 3 Encounters:  09/13/19 214 lb 9.6 oz (97.3 kg)  05/10/19 200 lb (90.7 kg)  01/18/19 207 lb (93.9 kg)    General: Elderly male, appears comfortable at rest. HEENT: Conjunctiva and lids normal, wearing  a mask. Neck: Supple, no elevated JVP or carotid bruits, no thyromegaly. Lungs: Clear to auscultation, nonlabored breathing at rest. Cardiac: Regular rate and rhythm, no S3, 3/6 systolic murmur with decreased second heart sound, no pericardial rub. Abdomen: Soft, nontender, bowel sounds present. Extremities: Chronic appearing lower leg edema and venous stasis, distal pulses 2+. Skin: Warm and dry. Musculoskeletal: No kyphosis. Neuropsychiatric: Alert and oriented x3, affect grossly appropriate.  ECG:  An ECG dated 06/07/2018 was personally reviewed today and demonstrated:  Sinus rhythm with prolonged PR interval, leftward axis, increased voltage with repolarization abnormalities.  Recent Labwork: 02/11/2019: TSH 2.570  06/07/2018: ALT 17; BUN 15; Creatinine, Ser 1.13; Hemoglobin 13.8; Platelets 217; Potassium 4.0; Sodium 129   Other Studies Reviewed Today:  Cardiac catheterization 10/07/2017: 1. Moderate diffuse coronary artery disease with moderate left mainstem stenosis, moderate stenosis of the proximal circumflex, mild nonobstructive stenosis of the LAD and RCA, and moderately severe stenosis of the diagonal origin 2. Continued patency of the stented segment in the LAD 3. Moderate aortic stenosis with a mean transvalvular gradient of 26 mmHg  Recommendations: All available data is reviewed. The patient really does not have any targets for PCI. I suspect a combination of moderate aortic stenosis, moderate coronary artery disease, and significant anemia accounts in combination for his symptoms. His hemoglobin is approximately 8 mg/dL which is down from a normal hemoglobin of 13 mg/dL last year. Advised follow-up with his primary care physician for consideration of elective blood transfusion and further work-up. I think his cardiac disease can be followed with medical therapy and serial echo follow-up. Findings discussed with Dr. Domenic Polite.  Note if the patient requires repeat  catheterization, I would not recommend using the right radial as an access site because of severe right subclavian tortuosity.  Echocardiogram 05/04/2019: 1. Left ventricular ejection fraction, by visual estimation, is 55 to 60%. The left ventricle has normal function. There is moderately increased left ventricular hypertrophy. 2. Left ventricular diastolic parameters are consistent with Grade I diastolic dysfunction (impaired relaxation). 3. Global right ventricle has normal systolic function.The right ventricular size is normal. No increase in right ventricular wall thickness. 4. Left atrial size was severely dilated. 5. Right atrial size was moderately dilated. 6. Moderate aortic valve annular calcification. 7. The mitral valve is normal in structure. No evidence of mitral valve regurgitation. No evidence of mitral stenosis. 8. The tricuspid valve is normal in structure. Tricuspid valve regurgitation is mild. 9. Aortic valve regurgitation is moderate. 10. The aortic valve is tricuspid. Aortic valve regurgitation is moderate. Severe aortic valve stenosis. Severe by AVA VTI and dimensionless index.Mean gradient (38 mmHg) essentially consistent with severe AS given other parameters 11. There is Moderate  calcification of the aortic valve. 12. There is Moderate thickening of the aortic valve. 13. The pulmonic valve was not well visualized. Pulmonic valve regurgitation is mild. 14. Normal pulmonary artery systolic pressure. 15. The inferior vena cava is normal in size with greater than 50% respiratory variability, suggesting right atrial pressure of 3 mmHg.  Carotid Dopplers 02/03/2019: Summary: Right Carotid: Velocities in the right ICA are consistent with a 40-59% stenosis. The ECA appears >50% stenosed.  Left Carotid: Evidence consistent with a total occlusion of the left ICA.  Vertebrals: Right vertebral artery demonstrates antegrade flow. Left  vertebral artery demonstrates an occlusion. Subclavians: Normal flow hemodynamics were seen in bilateral subclavian arteries.  Assessment and Plan:  1.  Severe calcific aortic stenosis.  We will plan a follow-up echocardiogram in June with clinical reevaluation.  He underwent evaluation for TAVR previously in 2019 at which point it was felt that his aortic stenosis was more in the moderate range.  He may need referral back for further discussion.  2.  Multivessel CAD, moderate diffuse disease with patent LAD stent site at cardiac catheterization in 2019.  He is reporting increased angina symptoms.  We will increase Imdur to 15 mg twice daily.  Refill for as needed nitroglycerin.  3.  Recurring leg swelling.  He has Lasix 40 mg with potassium supplements that he has been taking as needed.  We will start back on this daily for the next few days, check BMET today.  Further plans regarding standing dose going forward.  Medication Adjustments/Labs and Tests Ordered: Current medicines are reviewed at length with the patient today.  Concerns regarding medicines are outlined above.   Tests Ordered: Orders Placed This Encounter  Procedures  . Basic Metabolic Panel (BMET)  . EKG 12-Lead  . ECHOCARDIOGRAM COMPLETE    Medication Changes: Meds ordered this encounter  Medications  . isosorbide mononitrate (IMDUR) 30 MG 24 hr tablet    Sig: Take 0.5 tablets (15 mg total) by mouth in the morning and at bedtime.    Dispense:  90 tablet    Refill:  3  . nitroGLYCERIN (NITROSTAT) 0.4 MG SL tablet    Sig: Place 1 tablet (0.4 mg total) under the tongue every 5 (five) minutes as needed for chest pain.    Dispense:  25 tablet    Refill:  3    Disposition:  Follow up June for review of echocardiogram and symptoms.  Signed, Satira Sark, MD, [facility] 09/13/2019 1:29 PM    [facility] at [facility] [address] [place], [place] [address] Phone:  [phone]; Fax: [phone]

## 2019-09-16 ENCOUNTER — Telehealth: Payer: Self-pay

## 2019-09-16 MED ORDER — POTASSIUM CHLORIDE CRYS ER 20 MEQ PO TBCR: EXTENDED_RELEASE_TABLET | ORAL | 3 refills | Status: DC

## 2019-09-16 MED ORDER — FUROSEMIDE 40 MG PO TABS: ORAL_TABLET | ORAL | 3 refills | Status: DC

## 2019-09-16 NOTE — Telephone Encounter (Signed)
Called pt. No answer. Put in new RX with standing orders for lasix & potassium.

## 2019-09-16 NOTE — Telephone Encounter (Signed)
-----   Message from Satira Sark, MD sent at 09/13/2019  3:00 PM EDT ----- Results reviewed.  Follow-up lab work shows stable renal function, creatinine 1.19 and normal potassium.  For standing diuretic dosing I would use Lasix 40 mg on Monday, Wednesday, and Friday, also take potassium supplement with that - KCl 20 mEq.

## 2019-09-30 ENCOUNTER — Ambulatory Visit: Payer: Medicare Other | Admitting: Family Medicine

## 2019-11-15 ENCOUNTER — Emergency Department (HOSPITAL_COMMUNITY): Payer: Medicare Other

## 2019-11-15 ENCOUNTER — Inpatient Hospital Stay (HOSPITAL_COMMUNITY)
Admission: RE | Admit: 2019-11-15 | Discharge: 2019-12-07 | DRG: 216 | Disposition: A | Discharge status: Rehabilitation Facility | Payer: Medicare Other | Attending: Cardiothoracic Surgery | Admitting: Cardiothoracic Surgery

## 2019-11-15 ENCOUNTER — Other Ambulatory Visit: Payer: Self-pay

## 2019-11-15 ENCOUNTER — Encounter (HOSPITAL_COMMUNITY): Payer: Self-pay

## 2019-11-15 DIAGNOSIS — I25110 Atherosclerotic heart disease of native coronary artery with unstable angina pectoris: Secondary | ICD-10-CM | POA: Diagnosis present

## 2019-11-15 DIAGNOSIS — I252 Old myocardial infarction: Secondary | ICD-10-CM

## 2019-11-15 DIAGNOSIS — R7309 Other abnormal glucose: Secondary | ICD-10-CM | POA: Diagnosis not present

## 2019-11-15 DIAGNOSIS — N1832 Chronic kidney disease, stage 3b: Secondary | ICD-10-CM | POA: Diagnosis not present

## 2019-11-15 DIAGNOSIS — R079 Chest pain, unspecified: Secondary | ICD-10-CM | POA: Diagnosis not present

## 2019-11-15 DIAGNOSIS — R5381 Other malaise: Secondary | ICD-10-CM | POA: Diagnosis not present

## 2019-11-15 DIAGNOSIS — I679 Cerebrovascular disease, unspecified: Secondary | ICD-10-CM | POA: Diagnosis present

## 2019-11-15 DIAGNOSIS — Z01810 Encounter for preprocedural cardiovascular examination: Secondary | ICD-10-CM

## 2019-11-15 DIAGNOSIS — N179 Acute kidney failure, unspecified: Secondary | ICD-10-CM | POA: Diagnosis not present

## 2019-11-15 DIAGNOSIS — R350 Frequency of micturition: Secondary | ICD-10-CM | POA: Diagnosis not present

## 2019-11-15 DIAGNOSIS — J90 Pleural effusion, not elsewhere classified: Secondary | ICD-10-CM | POA: Diagnosis not present

## 2019-11-15 DIAGNOSIS — I083 Combined rheumatic disorders of mitral, aortic and tricuspid valves: Secondary | ICD-10-CM | POA: Diagnosis not present

## 2019-11-15 DIAGNOSIS — T8111XD Postprocedural  cardiogenic shock, subsequent encounter: Secondary | ICD-10-CM | POA: Diagnosis not present

## 2019-11-15 DIAGNOSIS — I350 Nonrheumatic aortic (valve) stenosis: Secondary | ICD-10-CM | POA: Diagnosis not present

## 2019-11-15 DIAGNOSIS — E039 Hypothyroidism, unspecified: Secondary | ICD-10-CM | POA: Diagnosis present

## 2019-11-15 DIAGNOSIS — I129 Hypertensive chronic kidney disease with stage 1 through stage 4 chronic kidney disease, or unspecified chronic kidney disease: Secondary | ICD-10-CM | POA: Diagnosis not present

## 2019-11-15 DIAGNOSIS — Z7984 Long term (current) use of oral hypoglycemic drugs: Secondary | ICD-10-CM

## 2019-11-15 DIAGNOSIS — I6523 Occlusion and stenosis of bilateral carotid arteries: Secondary | ICD-10-CM | POA: Diagnosis not present

## 2019-11-15 DIAGNOSIS — Z20822 Contact with and (suspected) exposure to covid-19: Secondary | ICD-10-CM | POA: Diagnosis present

## 2019-11-15 DIAGNOSIS — I351 Nonrheumatic aortic (valve) insufficiency: Secondary | ICD-10-CM | POA: Diagnosis not present

## 2019-11-15 DIAGNOSIS — E785 Hyperlipidemia, unspecified: Secondary | ICD-10-CM

## 2019-11-15 DIAGNOSIS — J9811 Atelectasis: Secondary | ICD-10-CM | POA: Diagnosis not present

## 2019-11-15 DIAGNOSIS — I5021 Acute systolic (congestive) heart failure: Secondary | ICD-10-CM | POA: Diagnosis not present

## 2019-11-15 DIAGNOSIS — R0689 Other abnormalities of breathing: Secondary | ICD-10-CM | POA: Diagnosis not present

## 2019-11-15 DIAGNOSIS — Z9889 Other specified postprocedural states: Secondary | ICD-10-CM

## 2019-11-15 DIAGNOSIS — K7200 Acute and subacute hepatic failure without coma: Secondary | ICD-10-CM | POA: Diagnosis not present

## 2019-11-15 DIAGNOSIS — E1140 Type 2 diabetes mellitus with diabetic neuropathy, unspecified: Secondary | ICD-10-CM | POA: Diagnosis not present

## 2019-11-15 DIAGNOSIS — Z006 Encounter for examination for normal comparison and control in clinical research program: Secondary | ICD-10-CM | POA: Diagnosis not present

## 2019-11-15 DIAGNOSIS — R000 Tachycardia, unspecified: Secondary | ICD-10-CM | POA: Diagnosis not present

## 2019-11-15 DIAGNOSIS — N990 Postprocedural (acute) (chronic) kidney failure: Secondary | ICD-10-CM | POA: Diagnosis not present

## 2019-11-15 DIAGNOSIS — E1151 Type 2 diabetes mellitus with diabetic peripheral angiopathy without gangrene: Secondary | ICD-10-CM | POA: Diagnosis not present

## 2019-11-15 DIAGNOSIS — I440 Atrioventricular block, first degree: Secondary | ICD-10-CM | POA: Diagnosis present

## 2019-11-15 DIAGNOSIS — Z955 Presence of coronary angioplasty implant and graft: Secondary | ICD-10-CM

## 2019-11-15 DIAGNOSIS — Z953 Presence of xenogenic heart valve: Secondary | ICD-10-CM | POA: Diagnosis not present

## 2019-11-15 DIAGNOSIS — I10 Essential (primary) hypertension: Secondary | ICD-10-CM | POA: Diagnosis not present

## 2019-11-15 DIAGNOSIS — D7589 Other specified diseases of blood and blood-forming organs: Secondary | ICD-10-CM | POA: Diagnosis not present

## 2019-11-15 DIAGNOSIS — Z9689 Presence of other specified functional implants: Secondary | ICD-10-CM

## 2019-11-15 DIAGNOSIS — I2510 Atherosclerotic heart disease of native coronary artery without angina pectoris: Secondary | ICD-10-CM | POA: Diagnosis not present

## 2019-11-15 DIAGNOSIS — R54 Age-related physical debility: Secondary | ICD-10-CM | POA: Diagnosis present

## 2019-11-15 DIAGNOSIS — E1165 Type 2 diabetes mellitus with hyperglycemia: Secondary | ICD-10-CM | POA: Diagnosis not present

## 2019-11-15 DIAGNOSIS — R600 Localized edema: Secondary | ICD-10-CM | POA: Diagnosis not present

## 2019-11-15 DIAGNOSIS — K449 Diaphragmatic hernia without obstruction or gangrene: Secondary | ICD-10-CM | POA: Diagnosis not present

## 2019-11-15 DIAGNOSIS — I517 Cardiomegaly: Secondary | ICD-10-CM | POA: Diagnosis not present

## 2019-11-15 DIAGNOSIS — D62 Acute posthemorrhagic anemia: Secondary | ICD-10-CM | POA: Diagnosis not present

## 2019-11-15 DIAGNOSIS — N170 Acute kidney failure with tubular necrosis: Secondary | ICD-10-CM | POA: Diagnosis not present

## 2019-11-15 DIAGNOSIS — I25118 Atherosclerotic heart disease of native coronary artery with other forms of angina pectoris: Secondary | ICD-10-CM | POA: Diagnosis not present

## 2019-11-15 DIAGNOSIS — Z79899 Other long term (current) drug therapy: Secondary | ICD-10-CM

## 2019-11-15 DIAGNOSIS — K219 Gastro-esophageal reflux disease without esophagitis: Secondary | ICD-10-CM | POA: Diagnosis not present

## 2019-11-15 DIAGNOSIS — I5031 Acute diastolic (congestive) heart failure: Secondary | ICD-10-CM | POA: Diagnosis not present

## 2019-11-15 DIAGNOSIS — Z01818 Encounter for other preprocedural examination: Secondary | ICD-10-CM | POA: Diagnosis not present

## 2019-11-15 DIAGNOSIS — E1122 Type 2 diabetes mellitus with diabetic chronic kidney disease: Secondary | ICD-10-CM | POA: Diagnosis present

## 2019-11-15 DIAGNOSIS — K5900 Constipation, unspecified: Secondary | ICD-10-CM | POA: Diagnosis not present

## 2019-11-15 DIAGNOSIS — E876 Hypokalemia: Secondary | ICD-10-CM | POA: Diagnosis not present

## 2019-11-15 DIAGNOSIS — D72829 Elevated white blood cell count, unspecified: Secondary | ICD-10-CM | POA: Diagnosis not present

## 2019-11-15 DIAGNOSIS — Z09 Encounter for follow-up examination after completed treatment for conditions other than malignant neoplasm: Secondary | ICD-10-CM

## 2019-11-15 DIAGNOSIS — I200 Unstable angina: Secondary | ICD-10-CM | POA: Diagnosis not present

## 2019-11-15 DIAGNOSIS — Z8249 Family history of ischemic heart disease and other diseases of the circulatory system: Secondary | ICD-10-CM

## 2019-11-15 DIAGNOSIS — K4090 Unilateral inguinal hernia, without obstruction or gangrene, not specified as recurrent: Secondary | ICD-10-CM

## 2019-11-15 DIAGNOSIS — I130 Hypertensive heart and chronic kidney disease with heart failure and stage 1 through stage 4 chronic kidney disease, or unspecified chronic kidney disease: Secondary | ICD-10-CM | POA: Diagnosis not present

## 2019-11-15 DIAGNOSIS — R570 Cardiogenic shock: Secondary | ICD-10-CM | POA: Diagnosis not present

## 2019-11-15 DIAGNOSIS — Q341 Congenital cyst of mediastinum: Secondary | ICD-10-CM | POA: Diagnosis not present

## 2019-11-15 DIAGNOSIS — Z4682 Encounter for fitting and adjustment of non-vascular catheter: Secondary | ICD-10-CM | POA: Diagnosis not present

## 2019-11-15 DIAGNOSIS — J939 Pneumothorax, unspecified: Secondary | ICD-10-CM | POA: Diagnosis not present

## 2019-11-15 DIAGNOSIS — Z7982 Long term (current) use of aspirin: Secondary | ICD-10-CM

## 2019-11-15 DIAGNOSIS — Z800 Family history of malignant neoplasm of digestive organs: Secondary | ICD-10-CM

## 2019-11-15 DIAGNOSIS — I358 Other nonrheumatic aortic valve disorders: Secondary | ICD-10-CM | POA: Diagnosis not present

## 2019-11-15 DIAGNOSIS — Z880 Allergy status to penicillin: Secondary | ICD-10-CM

## 2019-11-15 DIAGNOSIS — H9193 Unspecified hearing loss, bilateral: Secondary | ICD-10-CM | POA: Diagnosis not present

## 2019-11-15 DIAGNOSIS — Z79890 Hormone replacement therapy (postmenopausal): Secondary | ICD-10-CM

## 2019-11-15 DIAGNOSIS — I214 Non-ST elevation (NSTEMI) myocardial infarction: Principal | ICD-10-CM

## 2019-11-15 DIAGNOSIS — D539 Nutritional anemia, unspecified: Secondary | ICD-10-CM | POA: Diagnosis not present

## 2019-11-15 LAB — HEMOGLOBIN A1C
Hgb A1c MFr Bld: 7.4 % — ABNORMAL HIGH (ref 4.8–5.6)
Mean Plasma Glucose: 165.68 mg/dL

## 2019-11-15 LAB — BASIC METABOLIC PANEL
Anion gap: 10 (ref 5–15)
BUN: 20 mg/dL (ref 8–23)
CO2: 22 mmol/L (ref 22–32)
Calcium: 9.2 mg/dL (ref 8.9–10.3)
Chloride: 102 mmol/L (ref 98–111)
Creatinine, Ser: 1.41 mg/dL — ABNORMAL HIGH (ref 0.61–1.24)
GFR calc Af Amer: 50 mL/min — ABNORMAL LOW (ref >60)
GFR calc non Af Amer: 43 mL/min — ABNORMAL LOW (ref >60)
Glucose, Bld: 225 mg/dL — ABNORMAL HIGH (ref 70–99)
Potassium: 4 mmol/L (ref 3.5–5.1)
Sodium: 134 mmol/L — ABNORMAL LOW (ref 135–145)

## 2019-11-15 LAB — GLUCOSE, CAPILLARY
Glucose-Capillary: 140 mg/dL — ABNORMAL HIGH (ref 70–99)
Glucose-Capillary: 168 mg/dL — ABNORMAL HIGH (ref 70–99)

## 2019-11-15 LAB — CBC
HCT: 40.5 % (ref 39.0–52.0)
Hemoglobin: 13.5 g/dL (ref 13.0–17.0)
MCH: 34.6 pg — ABNORMAL HIGH (ref 26.0–34.0)
MCHC: 33.3 g/dL (ref 30.0–36.0)
MCV: 103.8 fL — ABNORMAL HIGH (ref 80.0–100.0)
Platelets: 207 10*3/uL (ref 150–400)
RBC: 3.9 MIL/uL — ABNORMAL LOW (ref 4.22–5.81)
RDW: 13.5 % (ref 11.5–15.5)
WBC: 15 10*3/uL — ABNORMAL HIGH (ref 4.0–10.5)
nRBC: 0 % (ref 0.0–0.2)

## 2019-11-15 LAB — CBG MONITORING, ED: Glucose-Capillary: 107 mg/dL — ABNORMAL HIGH (ref 70–99)

## 2019-11-15 LAB — BRAIN NATRIURETIC PEPTIDE: B Natriuretic Peptide: 606.3 pg/mL — ABNORMAL HIGH (ref 0.0–100.0)

## 2019-11-15 LAB — TROPONIN I (HIGH SENSITIVITY)
Troponin I (High Sensitivity): 68 ng/L — ABNORMAL HIGH (ref <18)
Troponin I (High Sensitivity): 4003 ng/L (ref <18)

## 2019-11-15 LAB — SARS CORONAVIRUS 2 (TAT 6-24 HRS): SARS Coronavirus 2: NEGATIVE

## 2019-11-15 LAB — FOLATE: Folate: 20.9 ng/mL (ref >5.9)

## 2019-11-15 LAB — VITAMIN B12: Vitamin B-12: 270 pg/mL (ref 180–914)

## 2019-11-15 MED ORDER — CLOPIDOGREL BISULFATE 75 MG PO TABS
75.0000 mg | ORAL_TABLET | Freq: Every day | ORAL | Status: DC
  Administered 2019-11-16: 75 mg via ORAL
  Filled 2019-11-15: qty 1

## 2019-11-15 MED ORDER — SODIUM CHLORIDE 0.9 % WEIGHT BASED INFUSION
3.0000 mL/kg/h | INTRAVENOUS | Status: DC
  Administered 2019-11-16: 3 mL/kg/h via INTRAVENOUS

## 2019-11-15 MED ORDER — SODIUM CHLORIDE 0.9 % WEIGHT BASED INFUSION
1.0000 mL/kg/h | INTRAVENOUS | Status: DC
  Administered 2019-11-16: 1 mL/kg/h via INTRAVENOUS

## 2019-11-15 MED ORDER — ONDANSETRON HCL 4 MG/2ML IJ SOLN: 4.0000 mg | Freq: Four times a day (QID) | INTRAMUSCULAR | Status: DC | PRN

## 2019-11-15 MED ORDER — FUROSEMIDE 10 MG/ML IJ SOLN
40.0000 mg | Freq: Once | INTRAMUSCULAR | Status: AC
  Administered 2019-11-15: 40 mg via INTRAVENOUS
  Filled 2019-11-15: qty 4

## 2019-11-15 MED ORDER — ASPIRIN 81 MG PO CHEW
324.0000 mg | CHEWABLE_TABLET | ORAL | Status: AC
  Administered 2019-11-15: 324 mg via ORAL
  Filled 2019-11-15: qty 4

## 2019-11-15 MED ORDER — SODIUM CHLORIDE 0.9% FLUSH
3.0000 mL | Freq: Two times a day (BID) | INTRAVENOUS | Status: DC
  Administered 2019-11-17 – 2019-11-20 (×4): 3 mL via INTRAVENOUS

## 2019-11-15 MED ORDER — CLOPIDOGREL BISULFATE 75 MG PO TABS
300.0000 mg | ORAL_TABLET | Freq: Once | ORAL | Status: AC
  Administered 2019-11-15: 300 mg via ORAL
  Filled 2019-11-15: qty 4

## 2019-11-15 MED ORDER — HEPARIN (PORCINE) 25000 UT/250ML-% IV SOLN
1200.0000 [IU]/h | INTRAVENOUS | Status: DC
  Administered 2019-11-15: 1200 [IU]/h via INTRAVENOUS
  Filled 2019-11-15: qty 250

## 2019-11-15 MED ORDER — SODIUM CHLORIDE 0.9% FLUSH: 3.0000 mL | INTRAVENOUS | Status: DC | PRN

## 2019-11-15 MED ORDER — NITROGLYCERIN 0.4 MG SL SUBL
0.4000 mg | SUBLINGUAL_TABLET | SUBLINGUAL | Status: DC | PRN
  Administered 2019-11-18 – 2019-11-20 (×5): 0.4 mg via SUBLINGUAL
  Filled 2019-11-15 (×3): qty 1

## 2019-11-15 MED ORDER — LEVOTHYROXINE SODIUM 50 MCG PO TABS
50.0000 ug | ORAL_TABLET | Freq: Every day | ORAL | Status: DC
  Administered 2019-11-16 – 2019-11-22 (×7): 50 ug via ORAL
  Filled 2019-11-15 (×7): qty 1

## 2019-11-15 MED ORDER — SODIUM CHLORIDE 0.9% FLUSH
3.0000 mL | Freq: Once | INTRAVENOUS | Status: AC
  Administered 2019-11-15: 3 mL via INTRAVENOUS

## 2019-11-15 MED ORDER — ISOSORBIDE MONONITRATE ER 30 MG PO TB24
15.0000 mg | ORAL_TABLET | Freq: Every day | ORAL | Status: DC
  Administered 2019-11-17: 15 mg via ORAL
  Filled 2019-11-15 (×3): qty 1

## 2019-11-15 MED ORDER — ASPIRIN EC 81 MG PO TBEC
81.0000 mg | DELAYED_RELEASE_TABLET | Freq: Every day | ORAL | Status: DC
  Administered 2019-11-17 – 2019-11-21 (×5): 81 mg via ORAL
  Filled 2019-11-15 (×6): qty 1

## 2019-11-15 MED ORDER — ASPIRIN 300 MG RE SUPP
300.0000 mg | RECTAL | Status: AC
  Filled 2019-11-15: qty 1

## 2019-11-15 MED ORDER — ASPIRIN EC 81 MG PO TBEC: 81.0000 mg | DELAYED_RELEASE_TABLET | Freq: Every day | ORAL | Status: DC

## 2019-11-15 MED ORDER — METOPROLOL SUCCINATE ER 25 MG PO TB24
25.0000 mg | ORAL_TABLET | Freq: Every day | ORAL | Status: DC
  Administered 2019-11-17 – 2019-11-19 (×3): 25 mg via ORAL
  Filled 2019-11-15 (×4): qty 1

## 2019-11-15 MED ORDER — ASPIRIN 81 MG PO CHEW
81.0000 mg | CHEWABLE_TABLET | ORAL | Status: AC
  Administered 2019-11-16: 81 mg via ORAL
  Filled 2019-11-15: qty 1

## 2019-11-15 MED ORDER — SODIUM CHLORIDE 0.9 % IV SOLN: 250.0000 mL | INTRAVENOUS | Status: DC | PRN

## 2019-11-15 MED ORDER — INSULIN ASPART 100 UNIT/ML ~~LOC~~ SOLN
0.0000 [IU] | Freq: Three times a day (TID) | SUBCUTANEOUS | Status: DC
  Administered 2019-11-15: 2 [IU] via SUBCUTANEOUS
  Administered 2019-11-16: 5 [IU] via SUBCUTANEOUS
  Administered 2019-11-16 – 2019-11-18 (×6): 2 [IU] via SUBCUTANEOUS
  Administered 2019-11-18: 3 [IU] via SUBCUTANEOUS
  Administered 2019-11-19 (×3): 2 [IU] via SUBCUTANEOUS
  Administered 2019-11-20: 3 [IU] via SUBCUTANEOUS
  Administered 2019-11-20: 2 [IU] via SUBCUTANEOUS
  Administered 2019-11-20 – 2019-11-21 (×2): 3 [IU] via SUBCUTANEOUS
  Administered 2019-11-21 (×2): 2 [IU] via SUBCUTANEOUS

## 2019-11-15 MED ORDER — HEPARIN BOLUS VIA INFUSION
4000.0000 [IU] | Freq: Once | INTRAVENOUS | Status: AC
  Administered 2019-11-15: 4000 [IU] via INTRAVENOUS
  Filled 2019-11-15: qty 4000

## 2019-11-15 MED ORDER — PRAVASTATIN SODIUM 40 MG PO TABS
40.0000 mg | ORAL_TABLET | Freq: Every day | ORAL | Status: DC
  Administered 2019-11-15: 40 mg via ORAL
  Filled 2019-11-15: qty 1

## 2019-11-15 MED ORDER — ACETAMINOPHEN 325 MG PO TABS: 650.0000 mg | ORAL_TABLET | ORAL | Status: DC | PRN

## 2019-11-15 NOTE — Consult Note (Addendum)
Cardiology Admission History and Physical:   Patient ID: William Walker MRN: [id number]; DOB: 28-May-1927   Admission date: 11/15/2019  Primary Care Provider: Cory Munch, [facility] Cardiologist: [name], MD   Chief Complaint: Chest pain   Patient Profile:   William Walker is a 86 y.o. male with with a hx of severe calcific aortic stenosis, multivessel CAD with moderate diffuse disease and patent LAD stent per last [place] 2019, CVD, hypertension, HLD and DM2 who is being seen today for the evaluation of chest pain at the request of Dr. Zenia Resides.   History of Present Illness:   William Walker is a 86 year old male with a history stated above who presented to [facility] on 11/15/2019 with exertional chest pain which worsened over the last several several days. Patient reports that he has had chronic exertional angina which severely limits his physical activity since 2018. He reports only being able to ambulate 25 to 29ft before needing to take a rest, at which time his symptoms are relieved. He states that he typically takes about 2 SL NTG tablets per day to control his symptoms. He states that yesterday evening, his symptoms were more intense and lasted longer in duration than normal. He does report that his angina is typically worse after breakfast. Last night, while his symptoms were more severe, he attempted to take two doses with no relief. He then fell asleep and woke with similar symptoms this morning and therefore he came to the ED for further evaluation. He denies SOB, dizziness, palpitations, PND, or syncope however does report LE leg weakness and LE edema.    He has been followed by Dr. Domenic Polite for his cardiology care. He was most recently seen 09/13/19 in follow up. At that time, he reported increased episodes of angina and nitroglycerin use with activities around his house as well as lower extremity edema.  He had previously been evaluated for TAVR procedure in 2019 however aortic  stenosis was felt to be more in the moderate range.  Last cardiac catheterization in 2019 with moderate to diffuse disease with patent LAD stent. Given his progressive angina, Imdur was increased to 50 mg twice daily.  Lasix was added to his regimen for LE edema with plans for follow-up echocardiogram which is scheduled to be performed on 11/22/2019.  In the ED, hsT found to be 68 with pending repeat. CXR with no acute cardiopulmonary disease. NOted to have a large hiatal hernia on CXR. EKG with NSR and 1st degree AV block with ST segment depressions in lateral and inferior leads, changed from prior tracing in 09/13/19. He is currently pain free.  Past Medical History:  Diagnosis Date  . Aortic stenosis   . Coronary atherosclerosis of native coronary artery    Reportedly 2 stents - presumably LAD (records not available)  . CVD (cerebrovascular disease)   . Essential hypertension   . GERD (gastroesophageal reflux disease)   . Hyperlipidemia   . MI (myocardial infarction) [facility])    October 1999  . Type 2 diabetes mellitus with diabetic neuropathy [facility])     Past Surgical History:  Procedure Laterality Date  . Arch cerebral ateriogram  12/11/2005   Rosetta Posner MD  . CATARACT EXTRACTION, BILATERAL    . INGUINAL HERNIA REPAIR Left 12/09/2012   Procedure: HERNIA REPAIR INGUINAL with mesh;  Surgeon: [name], MD;  Location: [facility];  Service: General;  Laterality: Left;  . INSERTION OF MESH Left 12/09/2012   Procedure:  INSERTION OF MESH;  Surgeon: [name], MD;  Location: [facility];  Service: General;  Laterality: Left;  . RIGHT/LEFT HEART CATH AND CORONARY ANGIOGRAPHY N/A 10/07/2017   Procedure: RIGHT/LEFT HEART CATH AND CORONARY ANGIOGRAPHY;  Surgeon: [name], MD;  Location: [facility];  Service: Cardiovascular;  Laterality: N/A;     Medications Prior to Admission: Prior to Admission medications   Medication Sig Start Date End Date Taking? Authorizing Provider  furosemide  (LASIX) 40 MG tablet Take 40 mg (1 tablet) on Monday, Wednesday & Friday. 09/16/19  Yes Satira Sark, MD  glipiZIDE (GLUCOTROL) 5 MG tablet Take 5 mg by mouth daily before breakfast.  11/04/18  Yes [provider]  isosorbide mononitrate (IMDUR) 30 MG 24 hr tablet Take 0.5 tablets (15 mg total) by mouth in the morning and at bedtime. 09/13/19 12/12/19 Yes Satira Sark, MD  levothyroxine (SYNTHROID) 50 MCG tablet Take 50 mcg by mouth daily before breakfast.  11/04/18  Yes [provider]  lisinopril (PRINIVIL,ZESTRIL) 10 MG tablet Take 1 tablet (10 mg total) by mouth daily. 11/14/17 11/15/19 Yes Satira Sark, MD  metoprolol succinate (TOPROL XL) 25 MG 24 hr tablet Take 1 tablet (25 mg total) by mouth daily. 09/22/17  Yes Satira Sark, MD  pravastatin (PRAVACHOL) 40 MG tablet Take 40 mg by mouth at bedtime.    Yes [provider]  Ascorbic Acid (VITAMIN C PO) Take 1 tablet by mouth daily.    [provider]  aspirin EC 81 MG tablet Take 81 mg by mouth daily.    [provider]  augmented betamethasone dipropionate (DIPROLENE-AF) 0.05 % cream Apply 1 application topically daily as needed (itching).  01/26/18   [provider]  lansoprazole (PREVACID) 15 MG capsule Take 15 mg by mouth daily with supper.     [provider]  nitroGLYCERIN (NITROSTAT) 0.4 MG SL tablet Place 1 tablet (0.4 mg total) under the tongue every 5 (five) minutes as needed for chest pain. 09/13/19   Satira Sark, MD  potassium chloride SA (KLOR-CON M20) 20 MEQ tablet Take 20 meq (1tablet) on Monday, Wednesday & Friday with lasix. 09/16/19   Satira Sark, MD     Allergies:    Allergies  Allergen Reactions  . Penicillins Other (See Comments)    Unknown Has patient had a PCN reaction causing immediate rash, facial/tongue/throat swelling, SOB or lightheadedness with hypotension: Unknown Has patient had a PCN reaction causing severe rash involving  mucus membranes or skin necrosis: Unknown Has patient had a PCN reaction that required hospitalization: Unknown Has patient had a PCN reaction occurring within the last 10 years: No If all of the above answers are "NO", then may proceed with Cephalosporin use.     Social History:   Social History   Socioeconomic History  . Marital status: Married    Spouse name: Not on file  . Number of children: Not on file  . Years of education: Not on file  . Highest education level: Not on file  Occupational History  . Not on file  Tobacco Use  . Smoking status: Former Smoker    Types: Pipe, Cigars    Quit date: 12/03/1948    Years since quitting: 70.9  . Smokeless tobacco: Current User    Types: Chew  . Tobacco comment: has been chewing tobacco for 60-70 years  Vaping Use  . Vaping Use: Never used  Substance and Sexual Activity  . Alcohol  use: No  . Drug use: No  . Sexual activity: Not on file  Other Topics Concern  . Not on file  Social History Narrative  . Not on file   Social Determinants of Health   Financial Resource Strain:   . Difficulty of Paying Living Expenses:   Food Insecurity:   . Worried About Charity fundraiser in the Last Year:   . Arboriculturist in the Last Year:   Transportation Needs:   . Film/video editor (Medical):   Marland Kitchen Lack of Transportation (Non-Medical):   Physical Activity:   . Days of Exercise per Week:   . Minutes of Exercise per Session:   Stress:   . Feeling of Stress :   Social Connections:   . Frequency of Communication with Friends and Family:   . Frequency of Social Gatherings with Friends and Family:   . Attends Religious Services:   . Active Member of Clubs or Organizations:   . Attends Archivist Meetings:   Marland Kitchen Marital Status:   Intimate Partner Violence:   . Fear of Current or Ex-Partner:   . Emotionally Abused:   Marland Kitchen Physically Abused:   . Sexually Abused:     Family History:   The patient's family history includes  Coronary artery disease in his father.    ROS:  Please see the history of present illness.  All other ROS reviewed and negative.     Physical Exam/Data:   Vitals:   11/15/19 1145  BP: (!) 160/84  Pulse: (!) 103  Resp: 16  Temp: 99.7 F (37.6 C)  TempSrc: Oral  SpO2: 98%   No intake or output data in the 24 hours ending 11/15/19 1534 Last 3 Weights 09/13/2019 05/10/2019 01/18/2019  Weight (lbs) 214 lb 9.6 oz 200 lb 207 lb  Weight (kg) 97.342 kg 90.719 kg 93.895 kg     There is no height or weight on file to calculate BMI.   General: Elderly, NAD Skin: Warm, dry, intact  Head: Normocephalic, atraumatic, sclera non-icteric, no xanthomas, clear, moist mucus membranes. Neck: Negative for carotid bruits. No JVD Lungs:Clear to ausculation bilaterally. No wheezes, rales, or rhonchi. Breathing is unlabored. Cardiovascular: RRR with S1 S2. + AS murmur with radiation  Abdomen: Soft, non-tender, non-distended. No obvious abdominal masses. Extremities: No edema. Radial pulses 2+ bilaterally Neuro: Alert and oriented. No focal deficits. No facial asymmetry. MAE spontaneously. Psych: Responds to questions appropriately with normal affect.     EKG:  The EKG was personally reviewed and demonstrates:  11/15/19 NSR with 1st degree AV block, ST segment depression in lateral and inferior leads when compared to prior tracing from 09/13/19  Telemetry:  Telemetry was personally reviewed and demonstrates: NSR   Relevant CV Studies:   LHC 10/07/2017:  1. Moderate diffuse coronary artery disease with moderate left mainstem stenosis, moderate stenosis of the proximal circumflex, mild nonobstructive stenosis of the LAD and RCA, and moderately severe stenosis of the diagonal origin 2.  Continued patency of the stented segment in the LAD 3.  Moderate aortic stenosis with a mean transvalvular gradient of 26 mmHg  Recommendations: All available data is reviewed.  The patient really does not have any  targets for PCI.  I suspect a combination of moderate aortic stenosis, moderate coronary artery disease, and significant anemia accounts in combination for his symptoms.  His hemoglobin is approximately 8 mg/dL which is down from a normal hemoglobin of 13 mg/dL last year.  Advised follow-up  with his primary care physician for consideration of elective blood transfusion and further work-up.  I think his cardiac disease can be followed with medical therapy and serial echo follow-up.  Findings discussed with Dr. Domenic Polite.  Note if the patient requires repeat catheterization, I would not recommend using the right radial as an access site because of severe right subclavian tortuosity.  Echocardiogram 05/04/2019:  1. Left ventricular ejection fraction, by visual estimation, is 55 to  60%. The left ventricle has normal function. There is moderately increased  left ventricular hypertrophy.  2. Left ventricular diastolic parameters are consistent with Grade I  diastolic dysfunction (impaired relaxation).  3. Global right ventricle has normal systolic function.The right  ventricular size is normal. No increase in right ventricular wall  thickness.  4. Left atrial size was severely dilated.  5. Right atrial size was moderately dilated.  6. Moderate aortic valve annular calcification.  7. The mitral valve is normal in structure. No evidence of mitral valve  regurgitation. No evidence of mitral stenosis.  8. The tricuspid valve is normal in structure. Tricuspid valve  regurgitation is mild.  9. Aortic valve regurgitation is moderate.  10. The aortic valve is tricuspid. Aortic valve regurgitation is moderate.  Severe aortic valve stenosis. Severe by AVA VTI and dimensionless  index.Mean gradient (38 mmHg) essentially consistent with severe AS given  other parameters  11. There is Moderate calcification of the aortic valve.  12. There is Moderate thickening of the aortic valve.  13. The pulmonic  valve was not well visualized. Pulmonic valve  regurgitation is mild.  14. Normal pulmonary artery systolic pressure.  15. The inferior vena cava is normal in size with greater than 50%  respiratory variability, suggesting right atrial pressure of 3 mmHg.    Carotid artery Doppler ultrasound 02/03/2019:  Summary:  Right Carotid: Velocities in the right ICA are consistent with a 40-59%         stenosis. The ECA appears >50% stenosed.   Left Carotid: Evidence consistent with a total occlusion of the left ICA.   Vertebrals: Right vertebral artery demonstrates antegrade flow. Left  vertebral        artery demonstrates an occlusion.  Subclavians: Normal flow hemodynamics were seen in bilateral subclavian        arteries.   Laboratory Data:  High Sensitivity Troponin:   Recent Labs  Lab 11/15/19 1206  TROPONINIHS 68*      Chemistry Recent Labs  Lab 11/15/19 1206  NA 134*  K 4.0  CL 102  CO2 22  GLUCOSE 225*  BUN 20  CREATININE 1.41*  CALCIUM 9.2  GFRNONAA 43*  GFRAA 50*  ANIONGAP 10    No results for input(s): PROT, ALBUMIN, AST, ALT, ALKPHOS, BILITOT in the last 168 hours. Hematology Recent Labs  Lab 11/15/19 1206  WBC 15.0*  RBC 3.90*  HGB 13.5  HCT 40.5  MCV 103.8*  MCH 34.6*  MCHC 33.3  RDW 13.5  PLT 207   BNPNo results for input(s): BNP, PROBNP in the last 168 hours.  DDimer No results for input(s): DDIMER in the last 168 hours.   Radiology/Studies:  DG Chest 2 View  Result Date: 11/15/2019 CLINICAL DATA:  Left-sided chest pain over the last 2 days. EXAM: CHEST - 2 VIEW COMPARISON:  10/02/2017 FINDINGS: Heart size is normal. Some aortic atherosclerotic calcification. Retrocardiac density most consistent with a moderate to large hiatal hernia. The lungs are clear. No infiltrate, collapse or effusion. Ordinary degenerative changes affect the  spine. IMPRESSION: No active disease. Moderate to large hiatal hernia. Aortic  atherosclerosis. Electronically Signed   By: Nelson Chimes M.D.   On: 11/15/2019 12:38       HEAR Score (for undifferentiated chest pain):  HEAR Score: 6      Assessment and Plan:   1. Chronic unstable angina worrisome for progression: -Pt presented 11/15/2019 with exertional chest pain which worsened over the last several several days. Patient reports that he has had chronic exertional angina which severely limits his physical activity since 2018. He reports only being able to ambulate 25 to 31ft before needing to take a rest, at which time his symptoms are relieved. He states that he typically takes about 2 SL NTG tablets per day to control his symptoms. He states that yesterday evening, his symptoms were more intense and lasted longer in duration than normal. He does report that his angina is typically worse after breakfast. Last night, while his symptoms were more severe, he attempted to take two doses with no relief. He then fell asleep and woke with similar symptoms this morning and therefore he came to the ED for further evaluation. He denies SOB, dizziness, palpitations, PND, or syncope however does report LE leg weakness and LE edema.   -hsT found to be 68 with pending repeat.  -CXR with no acute cardiopulmonary disease. Noted to have a large hiatal hernia on CXR. - EKG with NSR and 1st degree AV block with ST segment depressions in lateral and inferior leads, changed from prior tracing in 09/13/19. He is currently pain free. -ON PTA ASA, MDUR 30mg  PO QD, Toprol 25, pravastatin -Plan for [facility] to fully evaluate for progressive CAD>>>2019 cath with diagonal disease and 50% LM.  -Severe, progressive AS also likely contributing to symptoms    2.  Severe aortic stenosis: -Previously evaluated by structural heart team in 2019 at which time TAVR was deferred, AS felt to be moderate at that time -Most recent echocardiogram from 05/04/2019 with moderate aortic regurgitation, severe aortic stenosis  with a mean gradient of 38 mmHg, normal LV function at 57 to 60% with G1 DD -Plan was for repeat echocardiogram>> with possible referral back to structural heart team given progressive AS -We will repeat while inpatient and performed [facility] as above>>>symptoms worrisome for progressive CAD/AS  3.  Hypertension: -Stable, 160/84 -PTA Toprol, lisinopril  -Hold lisinopril in the setting of AKI   4.  Carotid artery disease: -Last vascular carotid ultrasound 02/03/2019 with R ICA with 40 to 59% stenosis and left total occlusion of the LICA. On PTA ASA, statin  -Denies dizziness, syncope  5.  LE edema: -Noted during last OV with Dr. McDowell>>prescribed Lasix 40mg  PO QD with moderate relief.  -Creatinine elevated from baseline at 1.41 with a baseline of 1.2 -Would benefit from IV Lasix and follow response  6. HLD: -Last LDL, 98 with a goal of <70 -Continue statin therapy   7. DM2: -SSI for glucose control while inpatient status  -On PTA glipizide  -Last HbA1c, 6.8 on 02/2019   8. GERD: -Hx of reflux typically controlled with Prevacid -CXR with moderate hiatal hernia -Transition to Protonix   9. AKI: -Creatinine elevated above baseline at 1.41 today -Baseline at 1.1-1.2 -Hold lisinopril -Pre-hydrate for cath>>post cath monitoring    Severity of Illness: The appropriate patient status for this patient is INPATIENT. Inpatient status is judged to be reasonable and necessary in order to provide the required intensity of service to ensure the patient's safety. The  patient's presenting symptoms, physical exam findings, and initial radiographic and laboratory data in the context of their chronic comorbidities is felt to place them at high risk for further clinical deterioration. Furthermore, it is not anticipated that the patient will be medically stable for discharge from the hospital within 2 midnights of admission. The following factors support the patient status of inpatient.   " The  patient's presenting symptoms include chest pain. " The worrisome physical exam findings include elevated troponin. " The initial radiographic and laboratory data are worrisome because of elevated troponin. " The chronic co-morbidities include CAD, DM2, CAD, Carotid artery disease   * I certify that at the point of admission it is my clinical judgment that the patient will require inpatient hospital care spanning beyond 2 midnights from the point of admission due to high intensity of service, high risk for further deterioration and high frequency of surveillance required.*    For questions or updates, please contact [place] Please consult [email or web address] for contact info under     Signed, Kathyrn Drown, NP  11/15/2019 3:34 PM     Patient seen and examined. Agree with assessment and plan.  William Walker is a young appearing 86 year old gentleman who has known CAD and underwent prior stenting of his LAD in 1999 by Dr. Evangeline Dakin.  He has a history of hypertension, hyperlipidemia, PVD with carotid stenosis and has developed progressive aortic valve stenosis.  His last cardiac catheterization was done in May 2019 which demonstrated a patent LAD stent with 75% ostial diagonal stenosis, 40% left main stenosis, 50% ostial circumflex stenosis, and 40% RCA stenoses.  He was felt to have moderate aortic stenosis at that time with a mean gradient of 30.  Over the last several years he has continued to progress anginal symptomatology and now essentially gets chest pain with walking every day.  He is followed by Dr. Domenic Polite.  His last echo Doppler study in December 2020 showed progression of his aortic stenosis with a mean gradient of 38 mmHg.  Over the past several months the patient now experiences daily chest pain with walking.  His chest pain is partially relieved with sublingual nitroglycerin.  He developed more significant chest pain leading to his present evaluation.  ECG today shows new mild  inferolateral ST segment depression which was not present on a prior ECG of several months ago.  He also has a remote history of left inguinal hernia repair and does have a large right inguinal hernia which has not been surgically corrected due to his underlying cardiac disease.  On exam today, blood pressure is elevated initially at 160/84.  Pulse is currently around 90 although previously was 103.  He appears younger than his stated age.  He has bilateral carotid bruits and transmitted murmurs.  He did not have rales on examination of his lungs or wheezing.  Rhythm was regular with a 2-3 late peaking systolic murmur consistent with aortic stenosis.  There was no S3 gallop.  Abdomen was soft nontender.  He had a large right inguinal hernia extending into his right scrotum.  There was 1+ pretibial pitting edema.  Neurologic exam was grossly nonfocal.  He had normal affect and mood.  Herbert Spires is notable for sodium of 134, creatinine of 1.41.  Estimated GFR 43.  High-sensitivity troponin is mildly increased at 68 possibly consistent with demand ischemia.  Hemoglobin hematocrit are stable at 13.5/40.5, but he has macrocytic indices with MCV of 103.8.  Glucose was elevated at 225.  Had a long discussion with the patient and his son.  I am concerned that his symptom complex is worrisome for a combination of progressive coronary obstructive disease as well as aortic valve stenosis.  I am recommending he undergo a follow-up echo Doppler study today if at all possible.  I have recommended definitive evaluation with right and left heart cardiac catheterization and have discussed with him the indication for this would be if he is at all willing to consider possible aortic valve replacement or CABG depending upon the findings.  He has documented PVD with left internal carotid artery occlusion.  LDL is increased at 98, more aggressive lipid-lowering therapy will be necessary.  The patient has undergone prior cardiac catheterization  and is aware of the risk benefits of the procedure.  We will tentatively set up in the a.m. with plans for follow-up laboratory in a.m. to make certain these are stable.  We will also check B12 and folate level with macrocytosis.  Troy Sine, MD, [facility] 11/15/2019 3:38 PM

## 2019-11-15 NOTE — H&P (View-Only) (Signed)
Notified by the nurse that his second troponin was 4000, up from 68. Repeat EKG showed increased ST elevation in lead V1-V2 but no reciprocol changes. I spoke with the patient, he says he has been having worsening exertional angina and dyspnea lately. Angina started again last night during sleep and did not go away this time. Currently he says majority of chest pain is gone, he still feels a twinge in the left chest, 2/10 in intensity, barely noticeable. He was asleep when I went into the room. I reviewed his case and EKG with our STEMI doc overnight Dr. Burt Knack, given the upward scooped ST segment and lack of reciprocal changes, he felt this may not meet STEMI criteria, furthermore, patient has severe aortic stenosis seen on previous Echo in 05/2019.   Dr. Burt Knack recommend continue IV heparin, continue aspirin, load with single dose of plavix 300mg  tonight and start on 75mg  daily of plavix tomorrow.

## 2019-11-15 NOTE — Progress Notes (Signed)
Notified by the nurse that his second troponin was 4000, up from 68. Repeat EKG showed increased ST elevation in lead V1-V2 but no reciprocol changes. I spoke with the patient, he says he has been having worsening exertional angina and dyspnea lately. Angina started again last night during sleep and did not go away this time. Currently he says majority of chest pain is gone, he still feels a twinge in the left chest, 2/10 in intensity, barely noticeable. He was asleep when I went into the room. I reviewed his case and EKG with our STEMI doc overnight Dr. Burt Knack, given the upward scooped ST segment and lack of reciprocal changes, he felt this may not meet STEMI criteria, furthermore, patient has severe aortic stenosis seen on previous Echo in 05/2019.   Dr. Burt Knack recommend continue IV heparin, continue aspirin, load with single dose of plavix 300mg  tonight and start on 75mg  daily of plavix tomorrow.

## 2019-11-15 NOTE — ED Triage Notes (Signed)
Pt arrives to ED w/ c/o 8/10 left sided chest pain radiating down L arm that started last night. Pt has been taking nitro w/ no relief. Pt has cardiac hx w/ stent placement. Pt denies sob, n/v, endorses mild diaphoresis.

## 2019-11-15 NOTE — Progress Notes (Signed)
[place] for heparin Indication: chest pain/ACS  Allergies  Allergen Reactions  . Penicillins Other (See Comments)    Unknown Has patient had a PCN reaction causing immediate rash, facial/tongue/throat swelling, SOB or lightheadedness with hypotension: Unknown Has patient had a PCN reaction causing severe rash involving mucus membranes or skin necrosis: Unknown Has patient had a PCN reaction that required hospitalization: Unknown Has patient had a PCN reaction occurring within the last 10 years: No If all of the above answers are "NO", then may proceed with Cephalosporin use.     Vital Signs: Temp: 99.7 F (37.6 C) (06/14 1145) Temp Source: Oral (06/14 1145) BP: 160/84 (06/14 1145) Pulse Rate: 103 (06/14 1145)  Labs: Recent Labs    11/15/19 1206  HGB 13.5  HCT 40.5  PLT 207  CREATININE 1.41*  TROPONINIHS 68*    CrCl cannot be calculated (Unknown ideal weight.).   Medications:  Scheduled:  . insulin aspart  0-15 Units Subcutaneous TID WC  . sodium chloride flush  3 mL Intravenous Once  . sodium chloride flush  3 mL Intravenous Q12H    Assessment: 4 yom presented with worsening of chronic CP and positive troponins. Pharmacy to dose heparin until cath. No anticoagulation PTA.   Baseline hg 13.5, plts 207. Weight was 214 lbs in 09/2019.   Goal of Therapy:  Heparin level 0.3-0.7 units/ml Monitor platelets by anticoagulation protocol: Yes   Plan:  Give 4000 units bolus x 1 Start heparin infusion at 1200 units/hr Check anti-Xa level in 8 hours and daily while on heparin Continue to monitor H&H and platelets   Thank you,   Eddie Candle, PharmD PGY-1 Pharmacy Resident   Please check amion for clinical pharmacist contact number 11/15/2019,3:42 PM

## 2019-11-15 NOTE — ED Provider Notes (Signed)
[facility] Provider Note   CSN: [id number] Arrival date & time: 11/15/19  1135     History Chief Complaint  Patient presents with  . Chest Pain    [facility] is a 86 y.o. male.  86 year old male who presents with intermittent chest pain times several days that has become worse today.  States he is had some exertional pressure that is relieved with rest.  Pain usually lasts for between 15 to 20 minutes and does not radiate down his arm.  Does get chest pain quite often and usually is relieved with nitroglycerin but he took 2 today which did not help.  He did become more dyspneic but denied having diuresis.  No fever, cough, congestion.  Scheduled to see his cardiologist in a week for an repeat echo.        Past Medical History:  Diagnosis Date  . Aortic stenosis   . Coronary atherosclerosis of native coronary artery    Reportedly 2 stents - presumably LAD (records not available)  . CVD (cerebrovascular disease)   . Essential hypertension   . GERD (gastroesophageal reflux disease)   . Hyperlipidemia   . MI (myocardial infarction) [facility])    October 1999  . Type 2 diabetes mellitus with diabetic neuropathy [facility])     Patient Active Problem List   Diagnosis Date Noted  . Chest pain 02/05/2017  . Aortic stenosis 02/05/2017  . Type II or unspecified type diabetes mellitus with other specified manifestations, not stated as uncontrolled 06/17/2013  . Occlusion and stenosis of carotid artery without mention of cerebral infarction 06/17/2013  . Inguinal hernia 06/17/2013  . Neuropathy due to secondary diabetes mellitus ([place]) 06/17/2013  . Peripheral neuropathy 04/01/2012  . Peripheral vascular disease of foot ([place]) 04/01/2012  . Bilateral carotid artery disease ([place]) 04/01/2012  . CAD, NATIVE VESSEL 01/25/2010  . Hyperlipidemia 12/23/2008  . Essential hypertension 12/23/2008  . CEREBROVASCULAR DISEASE, HX OF 12/23/2008    Past  Surgical History:  Procedure Laterality Date  . Arch cerebral ateriogram  12/11/2005   Rosetta Posner MD  . CATARACT EXTRACTION, BILATERAL    . INGUINAL HERNIA REPAIR Left 12/09/2012   Procedure: HERNIA REPAIR INGUINAL with mesh;  Surgeon: [name], MD;  Location: [facility];  Service: General;  Laterality: Left;  . INSERTION OF MESH Left 12/09/2012   Procedure: INSERTION OF MESH;  Surgeon: [name], MD;  Location: [facility];  Service: General;  Laterality: Left;  . RIGHT/LEFT HEART CATH AND CORONARY ANGIOGRAPHY N/A 10/07/2017   Procedure: RIGHT/LEFT HEART CATH AND CORONARY ANGIOGRAPHY;  Surgeon: [name], MD;  Location: [facility];  Service: Cardiovascular;  Laterality: N/A;       Family History  Problem Relation Age of Onset  . Coronary artery disease Father     Social History   Tobacco Use  . Smoking status: Former Smoker    Types: Pipe, Cigars    Quit date: 12/03/1948    Years since quitting: 70.9  . Smokeless tobacco: Current User    Types: Chew  . Tobacco comment: has been chewing tobacco for 60-70 years  Vaping Use  . Vaping Use: Never used  Substance Use Topics  . Alcohol use: No  . Drug use: No    Home Medications Prior to Admission medications   Medication Sig Start Date End Date Taking? Authorizing Provider  Ascorbic Acid (VITAMIN C PO) Take 1 tablet by mouth daily.    [provider]  aspirin EC 81 MG tablet Take 81 mg by mouth daily.    [provider]  augmented betamethasone dipropionate (DIPROLENE-AF) 0.05 % cream Apply 1 application topically daily as needed (itching).  01/26/18   [provider]  furosemide (LASIX) 40 MG tablet Take 40 mg (1 tablet) on Monday, Wednesday & Friday. 09/16/19   Satira Sark, MD  glipiZIDE (GLUCOTROL) 5 MG tablet  11/04/18   [provider]  isosorbide mononitrate (IMDUR) 30 MG 24 hr tablet Take 0.5 tablets (15 mg total) by mouth in the morning and at bedtime. 09/13/19 12/12/19   Satira Sark, MD  lansoprazole (PREVACID) 15 MG capsule Take 15 mg by mouth daily with supper.     [provider]  levothyroxine (SYNTHROID) 50 MCG tablet  11/04/18   [provider]  lisinopril (PRINIVIL,ZESTRIL) 10 MG tablet Take 1 tablet (10 mg total) by mouth daily. 11/14/17 05/10/19  Satira Sark, MD  metoprolol succinate (TOPROL XL) 25 MG 24 hr tablet Take 1 tablet (25 mg total) by mouth daily. 09/22/17   Satira Sark, MD  nitroGLYCERIN (NITROSTAT) 0.4 MG SL tablet Place 1 tablet (0.4 mg total) under the tongue every 5 (five) minutes as needed for chest pain. 09/13/19   Satira Sark, MD  potassium chloride SA (KLOR-CON M20) 20 MEQ tablet Take 20 meq (1tablet) on Monday, Wednesday & Friday with lasix. 09/16/19   Satira Sark, MD  pravastatin (PRAVACHOL) 40 MG tablet Take 40 mg by mouth at bedtime.     [provider]    Allergies    Penicillins  Review of Systems   Review of Systems  All other systems reviewed and are negative.   Physical Exam Updated Vital Signs BP (!) 160/84 (BP Location: Left Arm)   Pulse (!) 103   Temp 99.7 F (37.6 C) (Oral)   Resp 16   SpO2 98%   Physical Exam Vitals and nursing note reviewed.  Constitutional:      General: He is not in acute distress.    Appearance: Normal appearance. He is well-developed. He is not toxic-appearing.  HENT:     Head: Normocephalic and atraumatic.  Eyes:     General: Lids are normal.     Conjunctiva/sclera: Conjunctivae normal.     Pupils: Pupils are equal, round, and reactive to light.  Neck:     Thyroid: No thyroid mass.     Trachea: No tracheal deviation.  Cardiovascular:     Rate and Rhythm: Normal rate and regular rhythm.     Heart sounds: Normal heart sounds. No murmur heard.  No gallop.   Pulmonary:     Effort: Pulmonary effort is normal. No respiratory distress.     Breath sounds: Normal breath sounds. No stridor. No decreased breath sounds, wheezing,  rhonchi or rales.  Abdominal:     General: Bowel sounds are normal. There is no distension.     Palpations: Abdomen is soft.     Tenderness: There is no abdominal tenderness. There is no rebound.  Musculoskeletal:        General: No tenderness. Normal range of motion.     Cervical back: Normal range of motion and neck supple.  Skin:    General: Skin is warm and dry.     Findings: No abrasion or rash.  Neurological:     Mental Status: He is alert and oriented to person, place, and time.     GCS: GCS eye  subscore is 4. GCS verbal subscore is 5. GCS motor subscore is 6.     Cranial Nerves: No cranial nerve deficit.     Sensory: No sensory deficit.  Psychiatric:        Speech: Speech normal.        Behavior: Behavior normal.     ED Results / Procedures / Treatments   Labs (all labs ordered are listed, but only abnormal results are displayed) Labs Reviewed  BASIC METABOLIC PANEL - Abnormal; Notable for the following components:      Result Value   Sodium 134 (*)    Glucose, Bld 225 (*)    Creatinine, Ser 1.41 (*)    GFR calc non Af Amer 43 (*)    GFR calc Af Amer 50 (*)    All other components within normal limits  CBC - Abnormal; Notable for the following components:   WBC 15.0 (*)    RBC 3.90 (*)    MCV 103.8 (*)    MCH 34.6 (*)    All other components within normal limits  TROPONIN I (HIGH SENSITIVITY) - Abnormal; Notable for the following components:   Troponin I (High Sensitivity) 68 (*)    All other components within normal limits  TROPONIN I (HIGH SENSITIVITY)    EKG EKG Interpretation  Date/Time:  Monday November 15 2019 11:59:23 EDT Ventricular Rate:  96 PR Interval:  258 QRS Duration: 108 QT Interval:  354 QTC Calculation: 447 R Axis:   -20 Text Interpretation: Sinus rhythm with 1st degree A-V block Moderate voltage criteria for LVH, may be normal variant ( R in aVL , Cornell product ) ST-t wave abnormality Abnormal ECG Confirmed by Carmin Muskrat [phone]) on  11/15/2019 12:13:38 PM   Radiology DG Chest 2 View  Result Date: 11/15/2019 CLINICAL DATA:  Left-sided chest pain over the last 2 days. EXAM: CHEST - 2 VIEW COMPARISON:  10/02/2017 FINDINGS: Heart size is normal. Some aortic atherosclerotic calcification. Retrocardiac density most consistent with a moderate to large hiatal hernia. The lungs are clear. No infiltrate, collapse or effusion. Ordinary degenerative changes affect the spine. IMPRESSION: No active disease. Moderate to large hiatal hernia. Aortic atherosclerosis. Electronically Signed   By: Nelson Chimes M.D.   On: 11/15/2019 12:38    Procedures Procedures (including critical care time)  Medications Ordered in ED Medications  sodium chloride flush (NS) 0.9 % injection 3 mL (has no administration in time range)    ED Course  I have reviewed the triage vital signs and the nursing notes.  Pertinent labs & imaging results that were available during my care of the patient were reviewed by me and considered in my medical decision making (see chart for details).    MDM Rules/Calculators/A&P                          Patient is EKG is unchanged from prior.  First troponin is elevated here.  Second troponin pending at this time.  He has no chest pain.  Will consult cardiology Final Clinical Impression(s) / ED Diagnoses Final diagnoses:  None    Rx / DC Orders ED Discharge Orders    None       Lacretia Leigh, MD 11/15/19 1359

## 2019-11-15 NOTE — ED Provider Notes (Signed)
I received patient in signout from Dr. Zenia Resides.  He had presented with chest pain and had elevated troponin, awaiting cardiology evaluation and recommendations.  Cardiology saw the patient and admitted to Dr. Evette Georges service.   Saabir Blyth, Wenda Overland, MD 11/15/19 [phone]

## 2019-11-15 NOTE — Progress Notes (Signed)
Pts troponin 4 003, patient with no chest pain 0/10. Paged PA Ming and notified of results. Per MD do another EKG now, he will review it. RN and NT on night shift informed. EKG in progress.

## 2019-11-16 ENCOUNTER — Other Ambulatory Visit: Payer: Self-pay | Admitting: *Deleted

## 2019-11-16 ENCOUNTER — Encounter (HOSPITAL_COMMUNITY)
Admission: RE | Disposition: A | Discharge status: Rehabilitation Facility | Payer: Self-pay | Source: Home / Self Care | Attending: Cardiothoracic Surgery

## 2019-11-16 ENCOUNTER — Inpatient Hospital Stay (HOSPITAL_COMMUNITY): Payer: Medicare Other

## 2019-11-16 DIAGNOSIS — I351 Nonrheumatic aortic (valve) insufficiency: Secondary | ICD-10-CM

## 2019-11-16 DIAGNOSIS — I2510 Atherosclerotic heart disease of native coronary artery without angina pectoris: Secondary | ICD-10-CM

## 2019-11-16 DIAGNOSIS — I214 Non-ST elevation (NSTEMI) myocardial infarction: Principal | ICD-10-CM

## 2019-11-16 DIAGNOSIS — I350 Nonrheumatic aortic (valve) stenosis: Secondary | ICD-10-CM

## 2019-11-16 DIAGNOSIS — I25110 Atherosclerotic heart disease of native coronary artery with unstable angina pectoris: Secondary | ICD-10-CM

## 2019-11-16 DIAGNOSIS — Z01810 Encounter for preprocedural cardiovascular examination: Secondary | ICD-10-CM

## 2019-11-16 LAB — BASIC METABOLIC PANEL
Anion gap: 10 (ref 5–15)
BUN: 21 mg/dL (ref 8–23)
CO2: 24 mmol/L (ref 22–32)
Calcium: 8.7 mg/dL — ABNORMAL LOW (ref 8.9–10.3)
Chloride: 101 mmol/L (ref 98–111)
Creatinine, Ser: 1.24 mg/dL (ref 0.61–1.24)
GFR calc Af Amer: 59 mL/min — ABNORMAL LOW (ref >60)
GFR calc non Af Amer: 51 mL/min — ABNORMAL LOW (ref >60)
Glucose, Bld: 133 mg/dL — ABNORMAL HIGH (ref 70–99)
Potassium: 3.6 mmol/L (ref 3.5–5.1)
Sodium: 135 mmol/L (ref 135–145)

## 2019-11-16 LAB — CBC
HCT: 36 % — ABNORMAL LOW (ref 39.0–52.0)
Hemoglobin: 12.3 g/dL — ABNORMAL LOW (ref 13.0–17.0)
MCH: 34.6 pg — ABNORMAL HIGH (ref 26.0–34.0)
MCHC: 34.2 g/dL (ref 30.0–36.0)
MCV: 101.4 fL — ABNORMAL HIGH (ref 80.0–100.0)
Platelets: 184 10*3/uL (ref 150–400)
RBC: 3.55 MIL/uL — ABNORMAL LOW (ref 4.22–5.81)
RDW: 13.4 % (ref 11.5–15.5)
WBC: 14.1 10*3/uL — ABNORMAL HIGH (ref 4.0–10.5)
nRBC: 0 % (ref 0.0–0.2)

## 2019-11-16 LAB — POCT I-STAT EG7
Acid-Base Excess: 2 mmol/L (ref 0.0–2.0)
Acid-Base Excess: 2 mmol/L (ref 0.0–2.0)
Bicarbonate: 26.1 mmol/L (ref 20.0–28.0)
Bicarbonate: 26.3 mmol/L (ref 20.0–28.0)
Calcium, Ion: 1.18 mmol/L (ref 1.15–1.40)
Calcium, Ion: 1.19 mmol/L (ref 1.15–1.40)
HCT: 36 % — ABNORMAL LOW (ref 39.0–52.0)
HCT: 36 % — ABNORMAL LOW (ref 39.0–52.0)
Hemoglobin: 12.2 g/dL — ABNORMAL LOW (ref 13.0–17.0)
Hemoglobin: 12.2 g/dL — ABNORMAL LOW (ref 13.0–17.0)
O2 Saturation: 70 %
O2 Saturation: 64 %
Potassium: 3.4 mmol/L — ABNORMAL LOW (ref 3.5–5.1)
Potassium: 3.5 mmol/L (ref 3.5–5.1)
Sodium: 139 mmol/L (ref 135–145)
Sodium: 138 mmol/L (ref 135–145)
TCO2: 27 mmol/L (ref 22–32)
TCO2: 27 mmol/L (ref 22–32)
pCO2, Ven: 39.9 mmHg — ABNORMAL LOW (ref 44.0–60.0)
pCO2, Ven: 40.5 mmHg — ABNORMAL LOW (ref 44.0–60.0)
pH, Ven: 7.424 (ref 7.250–7.430)
pH, Ven: 7.42 (ref 7.250–7.430)
pO2, Ven: 36 mmHg (ref 32.0–45.0)
pO2, Ven: 33 mmHg (ref 32.0–45.0)

## 2019-11-16 LAB — ECHOCARDIOGRAM COMPLETE
Height: 71 in
Weight: 3308.66 oz

## 2019-11-16 LAB — POCT I-STAT 7, (LYTES, BLD GAS, ICA,H+H)
Acid-Base Excess: 0 mmol/L (ref 0.0–2.0)
Bicarbonate: 23.5 mmol/L (ref 20.0–28.0)
Calcium, Ion: 1.13 mmol/L — ABNORMAL LOW (ref 1.15–1.40)
HCT: 35 % — ABNORMAL LOW (ref 39.0–52.0)
Hemoglobin: 11.9 g/dL — ABNORMAL LOW (ref 13.0–17.0)
O2 Saturation: 98 %
Potassium: 3.4 mmol/L — ABNORMAL LOW (ref 3.5–5.1)
Sodium: 139 mmol/L (ref 135–145)
TCO2: 25 mmol/L (ref 22–32)
pCO2 arterial: 34.9 mmHg (ref 32.0–48.0)
pH, Arterial: 7.437 (ref 7.350–7.450)
pO2, Arterial: 107 mmHg (ref 83.0–108.0)

## 2019-11-16 LAB — LIPID PANEL
Cholesterol: 166 mg/dL (ref 0–200)
HDL: 47 mg/dL (ref >40)
LDL Cholesterol: 101 mg/dL — ABNORMAL HIGH (ref 0–99)
Total CHOL/HDL Ratio: 3.5 RATIO
Triglycerides: 88 mg/dL (ref <150)
VLDL: 18 mg/dL (ref 0–40)

## 2019-11-16 LAB — GLUCOSE, CAPILLARY
Glucose-Capillary: 133 mg/dL — ABNORMAL HIGH (ref 70–99)
Glucose-Capillary: 117 mg/dL — ABNORMAL HIGH (ref 70–99)
Glucose-Capillary: 224 mg/dL — ABNORMAL HIGH (ref 70–99)
Glucose-Capillary: 137 mg/dL — ABNORMAL HIGH (ref 70–99)
Glucose-Capillary: 108 mg/dL — ABNORMAL HIGH (ref 70–99)

## 2019-11-16 LAB — HEPARIN LEVEL (UNFRACTIONATED): Heparin Unfractionated: 0.55 IU/mL (ref 0.30–0.70)

## 2019-11-16 LAB — MRSA PCR SCREENING: MRSA by PCR: NEGATIVE

## 2019-11-16 SURGERY — RIGHT/LEFT HEART CATH AND CORONARY ANGIOGRAPHY
Anesthesia: LOCAL

## 2019-11-16 MED ORDER — LIDOCAINE HCL (PF) 1 % IJ SOLN
INTRAMUSCULAR | Status: AC
  Filled 2019-11-16: qty 30

## 2019-11-16 MED ORDER — SODIUM CHLORIDE 0.9 % IV SOLN: INTRAVENOUS | Status: AC

## 2019-11-16 MED ORDER — PERFLUTREN LIPID MICROSPHERE
1.0000 mL | INTRAVENOUS | Status: AC | PRN
  Administered 2019-11-16: 3 mL via INTRAVENOUS
  Filled 2019-11-16: qty 10

## 2019-11-16 MED ORDER — ACETAMINOPHEN 325 MG PO TABS
650.0000 mg | ORAL_TABLET | ORAL | Status: DC | PRN
  Administered 2019-11-20: 650 mg via ORAL
  Filled 2019-11-16: qty 2

## 2019-11-16 MED ORDER — CHLORHEXIDINE GLUCONATE CLOTH 2 % EX PADS
6.0000 | MEDICATED_PAD | Freq: Every day | CUTANEOUS | Status: DC
  Administered 2019-11-16 – 2019-11-21 (×6): 6 via TOPICAL

## 2019-11-16 MED ORDER — ROSUVASTATIN CALCIUM 5 MG PO TABS: 10.0000 mg | ORAL_TABLET | Freq: Every day | ORAL | Status: DC

## 2019-11-16 MED ORDER — ASPIRIN 81 MG PO CHEW: 81.0000 mg | CHEWABLE_TABLET | Freq: Every day | ORAL | Status: DC

## 2019-11-16 MED ORDER — MORPHINE SULFATE (PF) 2 MG/ML IV SOLN
2.0000 mg | INTRAVENOUS | Status: DC | PRN
  Administered 2019-11-20 (×2): 2 mg via INTRAVENOUS
  Filled 2019-11-16 (×2): qty 1

## 2019-11-16 MED ORDER — POTASSIUM CHLORIDE CRYS ER 20 MEQ PO TBCR
40.0000 meq | EXTENDED_RELEASE_TABLET | Freq: Once | ORAL | Status: AC
  Administered 2019-11-16: 40 meq via ORAL
  Filled 2019-11-16: qty 2

## 2019-11-16 MED ORDER — HEPARIN (PORCINE) IN NACL 1000-0.9 UT/500ML-% IV SOLN
INTRAVENOUS | Status: DC | PRN
  Administered 2019-11-16 (×2): 500 mL

## 2019-11-16 MED ORDER — AMLODIPINE BESYLATE 5 MG PO TABS
5.0000 mg | ORAL_TABLET | Freq: Every day | ORAL | Status: DC
  Administered 2019-11-16 – 2019-11-17 (×2): 5 mg via ORAL
  Filled 2019-11-16 (×2): qty 1

## 2019-11-16 MED ORDER — HYDRALAZINE HCL 20 MG/ML IJ SOLN: 10.0000 mg | INTRAMUSCULAR | Status: AC | PRN

## 2019-11-16 MED ORDER — LIDOCAINE HCL (PF) 1 % IJ SOLN
INTRAMUSCULAR | Status: DC | PRN
  Administered 2019-11-16: 20 mL

## 2019-11-16 MED ORDER — ONDANSETRON HCL 4 MG/2ML IJ SOLN: 4.0000 mg | Freq: Four times a day (QID) | INTRAMUSCULAR | Status: DC | PRN

## 2019-11-16 MED ORDER — SODIUM CHLORIDE 0.9% FLUSH
3.0000 mL | Freq: Two times a day (BID) | INTRAVENOUS | Status: DC
  Administered 2019-11-16 – 2019-11-20 (×5): 3 mL via INTRAVENOUS

## 2019-11-16 MED ORDER — HEPARIN (PORCINE) IN NACL 1000-0.9 UT/500ML-% IV SOLN
INTRAVENOUS | Status: AC
  Filled 2019-11-16: qty 1000

## 2019-11-16 MED ORDER — CLOPIDOGREL BISULFATE 75 MG PO TABS
75.0000 mg | ORAL_TABLET | Freq: Every day | ORAL | Status: DC
Start: 2019-11-17 — End: 2019-11-16

## 2019-11-16 MED ORDER — LABETALOL HCL 5 MG/ML IV SOLN: 10.0000 mg | INTRAVENOUS | Status: AC | PRN

## 2019-11-16 MED ORDER — SODIUM CHLORIDE 0.9 % IV SOLN: 250.0000 mL | INTRAVENOUS | Status: DC | PRN

## 2019-11-16 MED ORDER — HEPARIN (PORCINE) 25000 UT/250ML-% IV SOLN
1200.0000 [IU]/h | INTRAVENOUS | Status: DC
  Administered 2019-11-16 – 2019-11-22 (×7): 1200 [IU]/h via INTRAVENOUS
  Filled 2019-11-16 (×8): qty 250

## 2019-11-16 MED ORDER — FUROSEMIDE 10 MG/ML IJ SOLN
40.0000 mg | Freq: Once | INTRAMUSCULAR | Status: AC
  Administered 2019-11-16: 40 mg via INTRAVENOUS
  Filled 2019-11-16 (×2): qty 4

## 2019-11-16 MED ORDER — ROSUVASTATIN CALCIUM 20 MG PO TABS
20.0000 mg | ORAL_TABLET | Freq: Every day | ORAL | Status: DC
  Administered 2019-11-16 – 2019-11-21 (×6): 20 mg via ORAL
  Filled 2019-11-16 (×5): qty 1

## 2019-11-16 MED ORDER — IOHEXOL 350 MG/ML SOLN
INTRAVENOUS | Status: DC | PRN
  Administered 2019-11-16: 90 mL via INTRA_ARTERIAL

## 2019-11-16 MED ORDER — SODIUM CHLORIDE 0.9% FLUSH
3.0000 mL | INTRAVENOUS | Status: DC | PRN
  Administered 2019-11-19: 3 mL via INTRAVENOUS

## 2019-11-16 MED ORDER — ROSUVASTATIN CALCIUM 20 MG PO TABS
20.0000 mg | ORAL_TABLET | Freq: Every day | ORAL | Status: DC
  Filled 2019-11-16: qty 1

## 2019-11-16 SURGICAL SUPPLY — 14 items
CATH INFINITI 5FR MULTPACK ANG (CATHETERS) ×2 IMPLANT
CATH SWAN GANZ 7F STRAIGHT (CATHETERS) ×2 IMPLANT
CLOSURE MYNX CONTROL 5F (Vascular Products) ×2 IMPLANT
CLOSURE MYNX CONTROL 6F/7F (Vascular Products) ×2 IMPLANT
KIT HEART LEFT (KITS) ×2 IMPLANT
PACK CARDIAC CATHETERIZATION (CUSTOM PROCEDURE TRAY) ×2 IMPLANT
SHEATH PINNACLE 5F 10CM (SHEATH) ×2 IMPLANT
SHEATH PINNACLE 7F 10CM (SHEATH) ×2 IMPLANT
SYR MEDRAD MARK 7 150ML (SYRINGE) ×2 IMPLANT
TRANSDUCER W/STOPCOCK (MISCELLANEOUS) ×2 IMPLANT
TUBING CIL FLEX 10 FLL-RA (TUBING) ×2 IMPLANT
WIRE EMERALD 3MM-J .025X260CM (WIRE) ×2 IMPLANT
WIRE EMERALD 3MM-J .035X150CM (WIRE) ×2 IMPLANT
WIRE EMERALD ST .035X150CM (WIRE) ×2 IMPLANT

## 2019-11-16 NOTE — Progress Notes (Signed)
[place] for heparin Indication: chest pain/ACS  Allergies  Allergen Reactions  . Penicillins Other (See Comments)    Unknown reaction Has patient had a PCN reaction causing immediate rash, facial/tongue/throat swelling, SOB or lightheadedness with hypotension: Unknown Has patient had a PCN reaction causing severe rash involving mucus membranes or skin necrosis: Unknown Has patient had a PCN reaction that required hospitalization: Unknown Has patient had a PCN reaction occurring within the last 10 years: No If all of the above answers are "NO", then may proceed with Cephalosporin use.     Vital Signs: Temp: 98.3 F (36.8 C) (06/15 1130) Temp Source: Oral (06/15 1130) BP: 136/77 (06/15 1200) Pulse Rate: 64 (06/15 1200)  Labs: Recent Labs    11/15/19 1206 11/15/19 1206 11/15/19 1743 11/16/19 0026 11/16/19 0917  HGB 13.5   < >  --  12.3* 12.2*  HCT 40.5  --   --  36.0* 36.0*  PLT 207  --   --  184  --   HEPARINUNFRC  --   --   --  0.55  --   CREATININE 1.41*  --   --  1.24  --   TROPONINIHS 68*  --  4,003*  --   --    < > = values in this interval not displayed.    Estimated Creatinine Clearance: 45.4 mL/min (by C-G formula based on SCr of 1.24 mg/dL).   Medications:  Scheduled:  . aspirin EC  81 mg Oral Daily  . Chlorhexidine Gluconate Cloth  6 each Topical Daily  . insulin aspart  0-15 Units Subcutaneous TID WC  . isosorbide mononitrate  15 mg Oral Daily  . levothyroxine  50 mcg Oral QAC breakfast  . metoprolol succinate  25 mg Oral Daily  . potassium chloride  40 mEq Oral Once  . rosuvastatin  10 mg Oral Daily  . sodium chloride flush  3 mL Intravenous Q12H  . sodium chloride flush  3 mL Intravenous Q12H    Assessment: 87 yom presented with worsening of chronic CP and positive troponins. He is now s/p cath with severe AS and left main disease for CVTS consult. Pharmacy consulted to to dose heparin (sheath removed ~  10am)  Goal of Therapy:  Heparin level 0.3-0.7 units/ml Monitor platelets by anticoagulation protocol: Yes   Plan:  -Start heparin at 1200 units/hr at 6pm -Daily heparin level and CBC  Hildred Laser, PharmD Clinical Pharmacist **Pharmacist phone directory can now be found on [email or web address] ([email or web address]).  Listed under [place].

## 2019-11-16 NOTE — Consult Note (Signed)
[address]       [place] [address]             [id number]        Dariush T [facility] Medical Record #[id number] Date of Birth: [birth year]  Referring: Dr. Gwenlyn Found. MD Primary Care: Cory Munch, PA-C Primary Cardiologist:Samuel Domenic Polite, MD  Chief Complaint:    Chief Complaint  Patient presents with  . Chest Pain  Reason for consultation: Severe AS and coronary artery disease  History of Present Illness:     This is a 86 year old male with know aortic stenosis (moderate to severe in 03/19, peak gradient 42 mm Hg 4; 05/2019 mean gradient 38 mm hg peak 61.5 mm hg ), coronary artery disease (MI 1999, diffusely diseased, patent LAD stent 2019), hypertension, hyperlipidemia, and diabetes mellitus type II who presented to [facility] ED with complaints of chest pain. He has had this pain before (intermittently), it lasts for 15-20 minutes, and is usually relieved with rest;however, the chest pain became worse on 11/15/2019 and he still had it, despite taking 2 Nitroglycerin. He denies nausea, pain radiation, diaphoresis, shortness of breath but does admit to LE edema (feet). Initial EKG showed sinus rhythm, first degree AV block with ST segment depressions in lateral and inferior leads. Initial Troponin I (high sensitivity) was 68 and went up to 4003. He was put on a Heparin drip and later loaded with Plavix. Echo was done earlier today but results are not readily available. Cardiac catheterization done by Dr. Gwenlyn Found revealed ostial LM to mid LM 75% stenosis, ostial LAD 95% stenosis, second Diagonal 90% stenosis, and proximal RCA 75% stenosis. At the time of my exam, he denied chest pain or shortness of breath. His son and daughter are at his bedside. Vital signs are stable although SBP in the 150's.  Current Activity/ Functional Status: Patient is independent with mobility/ambulation, transfers, ADL's, IADL's.   Zubrod Score: At the time of surgery this patient's  most appropriate activity status/level should be described as: []     0    Normal activity, no symptoms [x]     1    Restricted in physical strenuous activity but ambulatory, able to do out light work. He mostly gets around in his 4 wheeler. []     2    Ambulatory and capable of self care, unable to do work activities, up and about  more than 50%  Of the time                            []     3    Only limited self care, in bed greater than 50% of waking hours []     4    Completely disabled, no self care, confined to bed or chair []     5    Moribund  Past Medical History:  Diagnosis Date  . Aortic stenosis   . Coronary atherosclerosis of native coronary artery    Reportedly 2 stents - presumably LAD (records not available)  . CVD (cerebrovascular disease)   . Essential hypertension   . GERD (gastroesophageal reflux disease)   . Hyperlipidemia   . MI (myocardial infarction) [facility])    October 1999  . Type 2 diabetes mellitus with diabetic neuropathy [facility])     Past Surgical History:  Procedure Laterality Date  . Arch cerebral ateriogram  12/11/2005   Rosetta Posner MD  .  CATARACT EXTRACTION, BILATERAL    . INGUINAL HERNIA REPAIR Left 12/09/2012   Procedure: HERNIA REPAIR INGUINAL with mesh;  Surgeon: [name], MD;  Location: [facility];  Service: General;  Laterality: Left;  . INSERTION OF MESH Left 12/09/2012   Procedure: INSERTION OF MESH;  Surgeon: [name], MD;  Location: [facility];  Service: General;  Laterality: Left;  . RIGHT/LEFT HEART CATH AND CORONARY ANGIOGRAPHY N/A 10/07/2017   Procedure: RIGHT/LEFT HEART CATH AND CORONARY ANGIOGRAPHY;  Surgeon: [name], MD;  Location: [place] [facility];  Service: Cardiovascular;  Laterality: N/A;    Social History   Tobacco Use  Smoking Status Former Smoker  . Types: Pipe, Cigars  . Quit date: 12/03/1948  . Years since quitting: 71.0  Smokeless Tobacco Current User  . Types: Chew  Tobacco Comment   has been chewing tobacco for  60-70 years    Social History   Substance and Sexual Activity  Alcohol Use No  Patient is married and wife is 32. She has had aortic valve replacement surgery by Dr Darcey Nora - mechanical valve  in the past. Patient was employed by [employer] ([employer]) for many years  Allergies  Allergen Reactions  . Penicillins Other (See Comments)    Unknown reaction Has patient had a PCN reaction causing immediate rash, facial/tongue/throat swelling, SOB or lightheadedness with hypotension: Unknown Has patient had a PCN reaction causing severe rash involving mucus membranes or skin necrosis: Unknown Has patient had a PCN reaction that required hospitalization: Unknown Has patient had a PCN reaction occurring within the last 10 years: No If all of the above answers are "NO", then may proceed with Cephalosporin use.     Current Facility-Administered Medications  Medication Dose Route Frequency Provider Last Rate Last Admin  . 0.9 %  sodium chloride infusion   Intravenous Continuous Lorretta Harp, MD 75 mL/hr at 11/16/19 1200 Rate Verify at 11/16/19 1200  . 0.9 %  sodium chloride infusion  250 mL Intravenous PRN Lorretta Harp, MD      . acetaminophen (TYLENOL) tablet 650 mg  650 mg Oral Q4H PRN Lorretta Harp, MD      . aspirin EC tablet 81 mg  81 mg Oral Daily Lorretta Harp, MD      . Chlorhexidine Gluconate Cloth 2 % PADS 6 each  6 each Topical Daily Troy Sine, MD   6 each at 11/16/19 1145  . hydrALAZINE (APRESOLINE) injection 10 mg  10 mg Intravenous Q20 Min PRN Lorretta Harp, MD      . insulin aspart (novoLOG) injection 0-15 Units  0-15 Units Subcutaneous TID WC Lorretta Harp, MD   2 Units at 11/16/19 [phone]  . isosorbide mononitrate (IMDUR) 24 hr tablet 15 mg  15 mg Oral Daily Lorretta Harp, MD      . labetalol (NORMODYNE) injection 10 mg  10 mg Intravenous Q10 min PRN Lorretta Harp, MD      . levothyroxine (SYNTHROID) tablet 50 mcg  50 mcg  Oral QAC breakfast Lorretta Harp, MD   50 mcg at 11/16/19 [phone]  . metoprolol succinate (TOPROL-XL) 24 hr tablet 25 mg  25 mg Oral Daily Lorretta Harp, MD      . morphine 2 MG/ML injection 2 mg  2 mg Intravenous Q1H PRN Lorretta Harp, MD      . nitroGLYCERIN (NITROSTAT) SL tablet 0.4 mg  0.4 mg Sublingual Q5 Min x  3 PRN Lorretta Harp, MD      . ondansetron [facility]) injection 4 mg  4 mg Intravenous Q6H PRN Lorretta Harp, MD      . pravastatin (PRAVACHOL) tablet 40 mg  40 mg Oral QHS Lorretta Harp, MD   40 mg at 11/15/19 2242  . sodium chloride flush (NS) 0.9 % injection 3 mL  3 mL Intravenous Q12H Lorretta Harp, MD      . sodium chloride flush (NS) 0.9 % injection 3 mL  3 mL Intravenous Q12H Lorretta Harp, MD      . sodium chloride flush (NS) 0.9 % injection 3 mL  3 mL Intravenous PRN Lorretta Harp, MD        Medications Prior to Admission  Medication Sig Dispense Refill Last Dose  . acetaminophen (TYLENOL) 500 MG tablet Take 500 mg by mouth every 6 (six) hours as needed for headache (pain).   Past Week at Unknown time  . aspirin EC 81 MG tablet Take 81 mg by mouth daily.   11/15/2019 at am  . Aspirin-Acetaminophen-Caffeine (GOODY HEADACHE PO) Take 1 packet by mouth daily as needed (headache).   11/15/2019 at am  . augmented betamethasone dipropionate (DIPROLENE-AF) 0.05 % cream Apply 1 application topically daily as needed (itching).   5 several weeks ago  . Calcium Carbonate Antacid (TUMS PO) Take 1 tablet by mouth 2 (two) times daily as needed (acid reflux).   11/15/2019 at am  . furosemide (LASIX) 40 MG tablet Take 40 mg (1 tablet) on Monday, Wednesday & Friday. (Patient taking differently: Take 40 mg by mouth See admin instructions. Take one tablet (40 mg) by mouth Monday, Wednesday, Friday mornings) 90 tablet 3 11/12/2019 at am  . glipiZIDE (GLUCOTROL) 5 MG tablet Take 5 mg by mouth daily before breakfast.    11/15/2019 at am  . isosorbide mononitrate (IMDUR) 30  MG 24 hr tablet Take 0.5 tablets (15 mg total) by mouth in the morning and at bedtime. 90 tablet 3 11/15/2019 at am  . levothyroxine (SYNTHROID) 50 MCG tablet Take 50 mcg by mouth daily before breakfast.    11/15/2019 at am  . lisinopril (PRINIVIL,ZESTRIL) 10 MG tablet Take 1 tablet (10 mg total) by mouth daily. 90 tablet 3 11/15/2019 at am  . metoprolol succinate (TOPROL XL) 25 MG 24 hr tablet Take 1 tablet (25 mg total) by mouth daily. 90 tablet 3 11/15/2019 at 8am  . nitroGLYCERIN (NITROSTAT) 0.4 MG SL tablet Place 1 tablet (0.4 mg total) under the tongue every 5 (five) minutes as needed for chest pain. 25 tablet 3 11/15/2019 at 930am  . potassium chloride SA (KLOR-CON M20) 20 MEQ tablet Take 20 meq (1tablet) on Monday, Wednesday & Friday with lasix. (Patient taking differently: Take 20 mEq by mouth See admin instructions. Take one tablet (20 meq) by mouth Monday, Wednesday, Friday mornings (with Lasix/furosemide)) 90 tablet 3 11/12/2019 at am  . pravastatin (PRAVACHOL) 40 MG tablet Take 40 mg by mouth at bedtime.    11/14/2019 at pm    Family History  Problem Relation Age of Onset  . Coronary artery disease, strokd Father   Mother had colon cancer and was deceased in her mid 26's  Review of Systems:     Cardiac Review of Systems: Y or  [    ]= no  Chest Pain [ Y   ]  Resting SOB [ N  ] Exertional SOB  [ N ]   Pedal Edema [  Y   ]    Palpitations [ N ] Syncope  [ N ]   Presyncope [ N  ]  General Review of Systems: [Y] = yes [ N ]=no Constitional: nausea Aqua.Slicker  ]; night sweats Aqua.Slicker  ]; fever [ N ]; or chills Aqua.Slicker  ]                                                               Dental: Last Dentist visit: Patient has upper and lower dentures and about 5 natural teeth  Eye :  Amaurosis fugax[ N ]; Resp: cough [ N ];  wheezing[ N ];  hemoptysis[N  ]; shortness of breath[ N ];  GI: vomiting[N  ];  dysphagia[  N]; melena[N  ];  hematochezia [ N ];  [id number][ N ];               Skin: rash, swelling[N   ];,  Heme/Lymph: anemia[ Y ];  Neuro: TIA[ N ]; stroke[N  ];  vertigo[N  ];  seizures[N  ];    Psych:depression[  ]; anxiety[  ];  Endocrine: diabetes[ N ];  thyroid dysfunction[ N ];              Physical Exam: BP 130/76   Pulse 68   Temp 98.3 F (36.8 C) (Oral)   Resp (!) 21   Ht 5\' 11"  (1.803 m)   Wt 93.8 kg   SpO2 96%   BMI 28.84 kg/m    General appearance: alert, cooperative and no distress Head: Normocephalic, without obvious abnormality, atraumatic Neck: no carotid bruit and supple, symmetrical, trachea midline Resp: Clear to auscultation bilaterally Cardio: RRR, grade III-IV systolic murmur best heart along left sternal border GI: Soft, non tender, bowel sounds present Extremities: Bilateral feet edema;Palpable pulses Neurologic: Grossly normal  Diagnostic Studies & Laboratory data:     Recent Radiology Findings:   DG Chest 2 View  Result Date: 11/15/2019 CLINICAL DATA:  Left-sided chest pain over the last 2 days. EXAM: CHEST - 2 VIEW COMPARISON:  10/02/2017 FINDINGS: Heart size is normal. Some aortic atherosclerotic calcification. Retrocardiac density most consistent with a moderate to large hiatal hernia. The lungs are clear. No infiltrate, collapse or effusion. Ordinary degenerative changes affect the spine. IMPRESSION: No active disease. Moderate to large hiatal hernia. Aortic atherosclerosis. Electronically Signed   By: Nelson Chimes M.D.   On: 11/15/2019 12:38   CARDIAC CATHETERIZATION: Diagnostic Dominance: Right  Intervention    Result Date: 11/16/2019  Mid LAD to Dist LAD lesion is 20% stenosed.  Ramus lesion is 95% stenosed.  Ost LAD lesion is 99% stenosed.  Ost LM to Mid LM lesion is 75% stenosed.  Prox Cx to Mid Cx lesion is 50% stenosed.  2nd Diag lesion is 90% stenosed.  Prox RCA lesion is 75% stenosed.  Hemodynamic findings consistent with aortic valve stenosis.  CHANZ CAHALL is a 86 y.o. male  [id number] LOCATION:  FACILITY: [facility] PHYSICIAN:  Quay Burow, M.D. August 04, 1926 DATE OF PROCEDURE:  11/16/2019 DATE OF DISCHARGE: CARDIAC CATHETERIZATION History obtained from chart review. KEVYN BOQUET is a 86 y.o. male with with a hx of severe calcific aortic stenosis, multivessel CAD with moderate diffuse disease and patent LAD stent per last LHC 2019, CVD, hypertension,  HLD and DM2 who is being seen today in the ER yesterday for chest pain which began on Sunday.  He has chronic nitrate responsive angina.  His troponins rose to 4000.  His EKG showed subtle J-point elevation.  His last 2D echo performed 3 months ago showed severe aortic stenosis.  He is referred for right left heart cath to define his anatomy and physiology.   Mr. Monsanto has had progression of his CAD.  His mid LAD stent is patent.  He has left main/three-vessel disease.  I believe he has at least 75% left main with a 99% ostial LAD 95% proximal to mid ramus branch stenosis as well as progression of disease in his dominant RCA.  He has severe aortic stenosis.  I do not think he is a percutaneous revascularization candidate.  Mynx closure devices were used to hemostatically sealed the right common femoral artery and venous puncture sites.  The patient left lab in stable condition.  Given his left main, severe CAD and the fact that these had chest pain with non-STEMI I am upgrading him to [facility] for closer clinical observation. Quay Burow. MD, [facility] 11/16/2019 9:52 AM     I have independently reviewed the above radiologic studies and discussed with the patient   Recent Lab Findings: Lab Results  Component Value Date   WBC 14.1 (H) 11/16/2019   HGB 12.2 (L) 11/16/2019   HCT 36.0 (L) 11/16/2019   PLT 184 11/16/2019   GLUCOSE 133 (H) 11/16/2019   CHOL 166 11/16/2019   TRIG 88 11/16/2019   HDL 47 11/16/2019   LDLCALC 101 (H) 11/16/2019   ALT 21 07/29/2018   AST 23 07/29/2018   NA 139 11/16/2019   K 3.4 (L) 11/16/2019   CL 101 11/16/2019   CREATININE 1.24 11/16/2019   BUN 21  11/16/2019   CO2 24 11/16/2019   TSH 2.570 02/11/2019   INR 1.02 10/02/2017   HGBA1C 7.4 (H) 11/15/2019   Assessment / Plan:   1. Coronary artery disease and moderate AI and severe aortic stenosis-Appears in need of CABG/AVR although at advanced age and co morbidities, will be high risk. Of note, patient loaded with Plavix yesterday so will have to have a Plavix washout. 2. History of hypertension-on Imdur 15 mg daily and Toprol XL 25 mg daily 3. History of hyperlipidemia-on Crestor 10 mg at hs 4. History of diabetes mellitus-on Insulin PRN and on Glipizide 5 mg prior to breakfast prior to admission. HGA1C 7.4 5. History of bilateral carotid artery disease-by duplex carotid US 09/20: right ICA 40-59% and left ICA has a total occlusion. Will check duplex carotid US. 6. History of hypothyroidism-on Levothyroxine 50 mcg daily   Lars Pinks PA-C 11/16/2019 12:11 PM  Patient seen and examined discussed the findings noted above including concomitant severe coronary artery disease progressive aortic stenosis with peak aortic gradient of 61 mm per mercury and mean gradient of 35 in December 2020 by echocardiogram.  The patient has been treated medically for his known aortic stenosis and coronary artery disease and now presents with acute exacerbation chest pain which is crescendo in nature and has been increasing in frequency severity over the past month.  Patient has known cerebrovascular disease with totally occluded occluded carotid on the left and right carotid artery disease which is asymptomatic.  I discussed with the patient and his daughter treatment options including continued medical care versus pulmonary artery bypass grafting aortic valve replacement with tissue valve.  With his degree of his  coronary artery disease especially left main disease is not amenable to angioplasty, TAVR alone would not be likely to change his symptoms much.  I reviewed the risks involved with cardiac surgery  and his age.,  Although he does functionally act at a much younger age.  The patient would like to discuss it with his family, including his daughter who is a Designer, jewellery and grandson and ICU nurse.   [name] MD      [address] [place] [address] Office [phone]

## 2019-11-16 NOTE — Progress Notes (Signed)
  Echocardiogram 2D Echocardiogram has been performed.  Michiel Cowboy 11/16/2019, 3:48 PM

## 2019-11-16 NOTE — Progress Notes (Signed)
Patient off of unit to cath lab. 

## 2019-11-16 NOTE — Progress Notes (Addendum)
Progress Note  Patient Name: William Walker Date of Encounter: 11/16/2019  Primary Cardiologist: Dr. Johnny Bridge  Subjective   Back from cath lab; now in [place] with severe MVCAD and subtotal ostial LAD with severe AS.  Inpatient Medications    Scheduled Meds: . aspirin EC  81 mg Oral Daily  . Chlorhexidine Gluconate Cloth  6 each Topical Daily  . insulin aspart  0-15 Units Subcutaneous TID WC  . isosorbide mononitrate  15 mg Oral Daily  . levothyroxine  50 mcg Oral QAC breakfast  . metoprolol succinate  25 mg Oral Daily  . pravastatin  40 mg Oral QHS  . sodium chloride flush  3 mL Intravenous Q12H  . sodium chloride flush  3 mL Intravenous Q12H   Continuous Infusions: . sodium chloride    . sodium chloride     PRN Meds: sodium chloride, acetaminophen, hydrALAZINE, labetalol, morphine injection, nitroGLYCERIN, ondansetron (ZOFRAN) IV, sodium chloride flush   Vital Signs    Vitals:   11/16/19 1045 11/16/19 1100 11/16/19 1115 11/16/19 1130  BP: (!) 149/75 (!) 151/73 (!) 149/71 130/76  Pulse: 73 (!) 45 70 68  Resp: (!) 21 19 19  (!) 21  Temp:    98.3 F (36.8 C)  TempSrc:    Oral  SpO2: 97% (!) 83% 95% 96%  Weight:      Height:        Intake/Output Summary (Last 24 hours) at 11/16/2019 1205 Last data filed at 11/16/2019 0612 Gross per 24 hour  Intake 424.38 ml  Output 675 ml  Net -250.62 ml    I/O since admission: -[place]   11/15/19 1840 11/16/19 0036 11/16/19 0500  Weight: 96.3 kg 93.8 kg 93.8 kg    Telemetry    Sinus 68 - Personally Reviewed  ECG    11/15/2019 ECG (independently read by me): Sinus rhythm at 96 with 1st degree AV block, with new ST depression not seen on prior ECG from several months ago  Physical Exam   BP 130/76   Pulse 68   Temp 98.3 F (36.8 C) (Oral)   Resp (!) 21   Ht 5\' 11"  (1.803 m)   Wt 93.8 kg   SpO2 96%   BMI 28.84 kg/m  General: Alert, oriented, no distress.  Skin: normal turgor, no rashes, warm and  dry HEENT: Normocephalic, atraumatic. Pupils equal round and reactive to light; sclera anicteric; extraocular muscles intact;  Nose without nasal septal hypertrophy Mouth/Parynx benign; Mallinpatti scale 3 Neck: No JVD, no carotid bruits; normal carotid upstroke Lungs: clear to ausculatation and percussion; no wheezing or rales Chest wall: without tenderness to palpitation Heart: PMI not displaced, RRR, s1 s2 normal, 3/6 late peaking systolic murmur, no diastolic murmur, no rubs, gallops, thrills, or heaves Abdomen: soft, nontender; no hepatosplenomehaly, BS+; abdominal aorta nontender and not dilated by palpation.  Large right inguinal hernia. Back: no CVA tenderness Pulses 2+; R groin site stable  Musculoskeletal: full range of motion, normal strength, no joint deformities Extremities: no clubbing cyanosis or edema, Homan's sign negative  Neurologic: grossly nonfocal; Cranial nerves grossly wnl Psychologic: Normal mood and affect   Labs    Chemistry Recent Labs  Lab 11/15/19 1206 11/16/19 0026 11/16/19 0917  NA 134* 135 139  K 4.0 3.6 3.4*  CL 102 101  --   CO2 22 24  --   GLUCOSE 225* 133*  --   BUN 20 21  --   CREATININE 1.41* 1.24  --  CALCIUM 9.2 8.7*  --   GFRNONAA 43* 51*  --   GFRAA 50* 59*  --   ANIONGAP 10 10  --      Hematology Recent Labs  Lab 11/15/19 1206 11/16/19 0026 11/16/19 0917  WBC 15.0* 14.1*  --   RBC 3.90* 3.55*  --   HGB 13.5 12.3* 12.2*  HCT 40.5 36.0* 36.0*  MCV 103.8* 101.4*  --   MCH 34.6* 34.6*  --   MCHC 33.3 34.2  --   RDW 13.5 13.4  --   PLT 207 184  --     Cardiac EnzymesNo results for input(s): TROPONINI in the last 168 hours. No results for input(s): TROPIPOC in the last 168 hours.   BNP Recent Labs  Lab 11/15/19 1947  BNP 606.3*     DDimer No results for input(s): DDIMER in the last 168 hours.   Lipid Panel     Component Value Date/Time   CHOL 166 11/16/2019 0026   CHOL 179 05/05/2018 1001   TRIG 88  11/16/2019 0026   TRIG 134 05/05/2018 1001   HDL 47 11/16/2019 0026   HDL 54 05/05/2018 1001   CHOLHDL 3.5 11/16/2019 0026   VLDL 18 11/16/2019 0026   LDLCALC 101 (H) 11/16/2019 0026     Radiology    DG Chest 2 View  Result Date: 11/15/2019 CLINICAL DATA:  Left-sided chest pain over the last 2 days. EXAM: CHEST - 2 VIEW COMPARISON:  10/02/2017 FINDINGS: Heart size is normal. Some aortic atherosclerotic calcification. Retrocardiac density most consistent with a moderate to large hiatal hernia. The lungs are clear. No infiltrate, collapse or effusion. Ordinary degenerative changes affect the spine. IMPRESSION: No active disease. Moderate to large hiatal hernia. Aortic atherosclerosis. Electronically Signed   By: Nelson Chimes M.D.   On: 11/15/2019 12:38   CARDIAC CATHETERIZATION  Result Date: 11/16/2019  Mid LAD to Dist LAD lesion is 20% stenosed.  Ramus lesion is 95% stenosed.  Ost LAD lesion is 99% stenosed.  Ost LM to Mid LM lesion is 75% stenosed.  Prox Cx to Mid Cx lesion is 50% stenosed.  2nd Diag lesion is 90% stenosed.  Prox RCA lesion is 75% stenosed.  Hemodynamic findings consistent with aortic valve stenosis.  LARWENCE Walker is a 86 y.o. male  [id number] LOCATION:  FACILITY: [facility] PHYSICIAN: Quay Burow, M.D. 1926-10-01 DATE OF PROCEDURE:  11/16/2019 DATE OF DISCHARGE: CARDIAC CATHETERIZATION History obtained from chart review. William Walker is a 86 y.o. male with with a hx of severe calcific aortic stenosis, multivessel CAD with moderate diffuse disease and patent LAD stent per last LHC 2019, CVD, hypertension, HLD and DM2 who is being seen today in the ER yesterday for chest pain which began on Sunday.  He has chronic nitrate responsive angina.  His troponins rose to 4000.  His EKG showed subtle J-point elevation.  His last 2D echo performed 3 months ago showed severe aortic stenosis.  He is referred for right left heart cath to define his anatomy and physiology.   Mr. Leanos has had  progression of his CAD.  His mid LAD stent is patent.  He has left main/three-vessel disease.  I believe he has at least 75% left main with a 99% ostial LAD 95% proximal to mid ramus branch stenosis as well as progression of disease in his dominant RCA.  He has severe aortic stenosis.  I do not think he is a percutaneous revascularization candidate.  Mynx closure devices were  used to hemostatically sealed the right common femoral artery and venous puncture sites.  The patient left lab in stable condition.  Given his left main, severe CAD and the fact that these had chest pain with non-STEMI I am upgrading him to [facility] for closer clinical observation. Quay Burow. MD, [facility] 11/16/2019 9:52 AM    Cardiac Studies   History obtained from chart review. Antoneo Ghrist Rossis a 86 y.o.malewith with a hx ofsevere calcific aortic stenosis, multivessel CAD with moderate diffuse disease and patent LAD stent per last LHC 2019, CVD, hypertension, HLD and DM2who is being seen today in the ER yesterday for chest pain which began on Sunday.  He has chronic nitrate responsive angina.  His troponins rose to 4000.  His EKG showed subtle J-point elevation.  His last 2D echo performed 3 months ago showed severe aortic stenosis.  He is referred for right left heart cath to define his anatomy and physiology.   IMPRESSION: Mr. Kapur has had progression of his CAD.  His mid LAD stent is patent.  He has left main/three-vessel disease.  I believe he has at least 75% left main with a 99% ostial LAD 95% proximal to mid ramus branch stenosis as well as progression of disease in his dominant RCA.  He has severe aortic stenosis.  I do not think he is a percutaneous revascularization candidate.  Mynx closure devices were used to hemostatically sealed the right common femoral artery and venous puncture sites.  The patient left lab in stable condition.  Given his left main, severe CAD and the fact that these had chest pain with non-STEMI I am  upgrading him to [facility] for closer clinical observation.  Right Heart Pressures Hemodynamic findings consistent with aortic valve stenosis. Right atrial pressure-15/13, mean 12 Right ventricular pressure-40/9 Pulmonary artery pressure-37/19, mean 26 Pulmonary wedge pressure-A-wave 21, V wave 20, mean 19 Cardiac output by Fick was 5.6 L/min with an index of 2.6 L/min/m Cardiac output by thermodilution was 4.2 L/min with an index of 2 L/min/m LVEDP-29 Aortic peak to peak gradient was 33, mean gradient was 30 Aortic valve area by Fick was 0.98 cm and by thermodilution 0.75 cm       Patient Profile     KAILAN LAWS is a 86 y.o. male with with a hx of severe calcific aortic stenosis, multivessel CAD with moderate diffuse disease and patent LAD stent per last LHC 2019, CVD, hypertension, HLD and DM2 who was admitted on November 15, 2019 with progressive episodes of daily chest pain and concern for unstable angina and aortic stenosis progression.  Assessment & Plan    1.  Severe multivessel CAD: Patient underwent cardiac catheterization today which reveals progression of multivessel CAD involving the left main, 99% stenosis at the ostium of the LAD with progressive disease in the ramus, left circumflex, and RCA.  Troponin was greater than 4000 from the initial at 68 and apparently the case was reviewed with Dr. Burt Knack and the patient received 300 mg of Plavix last evening and 75 mg this morning.  He is now into heart, pain-free.  Right groin catheterization site is stable.  Will initiate systemic heparin therapy 8 hours post sheath removal without bolus in light of subtotal ostial LAD stenosis.  2.  Progressive severe aortic stenosis: Cath data reviewed.  Will obtain surgical consultation for CABG and surgical aortic valve replacement.  However with Plavix load last evening, will need Plavix washout.  Will check P2Y12 test in several days to  see if patient is Plavix responsive or not.  3.  Elevated  LVEDP at 29 mm.  Will give a dose of intravenous furosemide.  4.  Essential  hypertension: Blood pressure now is elevated at 178/54.  We will add amlodipine 5 mg to his medical regimen.  5.  Hyperlipidemia: We will change statin from pravastatin to rosuvastatin 20 mg daily for more aggressive lipid lowering.  6. Type  2 diabetes mellitus: Hemoglobin A1c 7.4.   7.  Carotid artery disease with last ultrasound February 03, 2019 showing 40 to 59% right internal carotid stenosis and left total occlusion.  Signed, Troy Sine, MD, [facility] 11/16/2019, 12:05 PM

## 2019-11-16 NOTE — Plan of Care (Signed)

## 2019-11-16 NOTE — Interval H&P Note (Signed)
Cath Lab Visit (complete for each Cath Lab visit)  Clinical Evaluation Leading to the Procedure:   ACS: Yes.    Non-ACS:    Anginal Classification: CCS III  Anti-ischemic medical therapy: Maximal Therapy (2 or more classes of medications)  Non-Invasive Test Results: No non-invasive testing performed  Prior CABG: No previous CABG      History and Physical Interval Note:  11/16/2019 8:39 AM  William Walker  has presented today for surgery, with the diagnosis of chest pain.  The various methods of treatment have been discussed with the patient and family. After consideration of risks, benefits and other options for treatment, the patient has consented to  Procedure(s): RIGHT/LEFT HEART CATH AND CORONARY ANGIOGRAPHY (N/A) as a surgical intervention.  The patient's history has been reviewed, patient examined, no change in status, stable for surgery.  I have reviewed the patient's chart and labs.  Questions were answered to the patient's satisfaction.     Quay Burow

## 2019-11-16 NOTE — Progress Notes (Signed)
Pre-CABG Dopplers completed. Refer to "CV Proc" under chart review to view preliminary results.  11/16/2019 4:34 PM Kelby Aline., MHA, RVT, RDCS, RDMS

## 2019-11-16 NOTE — Progress Notes (Signed)
[place] for heparin Indication: chest pain/ACS  Allergies  Allergen Reactions  . Penicillins Other (See Comments)    Unknown reaction Has patient had a PCN reaction causing immediate rash, facial/tongue/throat swelling, SOB or lightheadedness with hypotension: Unknown Has patient had a PCN reaction causing severe rash involving mucus membranes or skin necrosis: Unknown Has patient had a PCN reaction that required hospitalization: Unknown Has patient had a PCN reaction occurring within the last 10 years: No If all of the above answers are "NO", then may proceed with Cephalosporin use.     Vital Signs: Temp: 98.7 F (37.1 C) (06/15 0450) Temp Source: Oral (06/15 0450) BP: 111/64 (06/15 0450) Pulse Rate: 67 (06/15 0450)  Labs: Recent Labs    11/15/19 1206 11/15/19 1743 11/16/19 0026  HGB 13.5  --  12.3*  HCT 40.5  --  36.0*  PLT 207  --  184  HEPARINUNFRC  --   --  0.55  CREATININE 1.41*  --  1.24  TROPONINIHS 68* 4,003*  --     Estimated Creatinine Clearance: 45.4 mL/min (by C-G formula based on SCr of 1.24 mg/dL).   Medications:  Scheduled:  . aspirin EC  81 mg Oral Daily  . clopidogrel  75 mg Oral Daily  . insulin aspart  0-15 Units Subcutaneous TID WC  . isosorbide mononitrate  15 mg Oral Daily  . levothyroxine  50 mcg Oral QAC breakfast  . metoprolol succinate  25 mg Oral Daily  . pravastatin  40 mg Oral QHS  . sodium chloride flush  3 mL Intravenous Q12H    Assessment: 15 yom presented with worsening of chronic CP and positive troponins. Pharmacy to dose heparin until cath. No anticoagulation PTA.   Baseline hg 13.5, plts 207. Weight 93.8kg 11/16/19.   Goal of Therapy:  Heparin level 0.3-0.7 units/ml Monitor platelets by anticoagulation protocol: Yes   Plan: Continue heparin infusion at 1200 units/hr  Check heparin level in 8 hours (0830) Check daily CBC   Adria Dill, PharmD-Candidate

## 2019-11-17 ENCOUNTER — Encounter (HOSPITAL_COMMUNITY): Payer: Self-pay | Admitting: Cardiovascular Disease

## 2019-11-17 ENCOUNTER — Other Ambulatory Visit: Payer: Self-pay

## 2019-11-17 ENCOUNTER — Inpatient Hospital Stay (HOSPITAL_COMMUNITY): Payer: Medicare Other

## 2019-11-17 DIAGNOSIS — I350 Nonrheumatic aortic (valve) stenosis: Secondary | ICD-10-CM

## 2019-11-17 DIAGNOSIS — Z01810 Encounter for preprocedural cardiovascular examination: Secondary | ICD-10-CM

## 2019-11-17 DIAGNOSIS — I2510 Atherosclerotic heart disease of native coronary artery without angina pectoris: Secondary | ICD-10-CM

## 2019-11-17 LAB — PULMONARY FUNCTION TEST
FEF 25-75 Pre: 0.8 L/sec
FEF2575-%Pred-Pre: 52 %
FEV1-%Pred-Pre: 63 %
FEV1-Pre: 1.59 L
FEV1FVC-%Pred-Pre: 96 %
FEV6-%Pred-Pre: 66 %
FEV6-Pre: 2.29 L
FEV6FVC-%Pred-Pre: 104 %
FVC-%Pred-Pre: 64 %
FVC-Pre: 2.38 L
Pre FEV1/FVC ratio: 67 %
Pre FEV6/FVC Ratio: 96 %

## 2019-11-17 LAB — BASIC METABOLIC PANEL
Anion gap: 11 (ref 5–15)
BUN: 19 mg/dL (ref 8–23)
CO2: 24 mmol/L (ref 22–32)
Calcium: 8.4 mg/dL — ABNORMAL LOW (ref 8.9–10.3)
Chloride: 103 mmol/L (ref 98–111)
Creatinine, Ser: 1.27 mg/dL — ABNORMAL HIGH (ref 0.61–1.24)
GFR calc Af Amer: 57 mL/min — ABNORMAL LOW (ref >60)
GFR calc non Af Amer: 49 mL/min — ABNORMAL LOW (ref >60)
Glucose, Bld: 139 mg/dL — ABNORMAL HIGH (ref 70–99)
Potassium: 3.8 mmol/L (ref 3.5–5.1)
Sodium: 138 mmol/L (ref 135–145)

## 2019-11-17 LAB — HEPATIC FUNCTION PANEL
ALT: 16 U/L (ref 0–44)
AST: 29 U/L (ref 15–41)
Albumin: 3.2 g/dL — ABNORMAL LOW (ref 3.5–5.0)
Alkaline Phosphatase: 63 U/L (ref 38–126)
Bilirubin, Direct: 0.2 mg/dL (ref 0.0–0.2)
Indirect Bilirubin: 1 mg/dL — ABNORMAL HIGH (ref 0.3–0.9)
Total Bilirubin: 1.2 mg/dL (ref 0.3–1.2)
Total Protein: 5.9 g/dL — ABNORMAL LOW (ref 6.5–8.1)

## 2019-11-17 LAB — GLUCOSE, CAPILLARY
Glucose-Capillary: 137 mg/dL — ABNORMAL HIGH (ref 70–99)
Glucose-Capillary: 141 mg/dL — ABNORMAL HIGH (ref 70–99)
Glucose-Capillary: 144 mg/dL — ABNORMAL HIGH (ref 70–99)
Glucose-Capillary: 132 mg/dL — ABNORMAL HIGH (ref 70–99)

## 2019-11-17 LAB — CBC
HCT: 37.4 % — ABNORMAL LOW (ref 39.0–52.0)
Hemoglobin: 13 g/dL (ref 13.0–17.0)
MCH: 35 pg — ABNORMAL HIGH (ref 26.0–34.0)
MCHC: 34.8 g/dL (ref 30.0–36.0)
MCV: 100.8 fL — ABNORMAL HIGH (ref 80.0–100.0)
Platelets: 185 10*3/uL (ref 150–400)
RBC: 3.71 MIL/uL — ABNORMAL LOW (ref 4.22–5.81)
RDW: 13.4 % (ref 11.5–15.5)
WBC: 10.1 10*3/uL (ref 4.0–10.5)
nRBC: 0 % (ref 0.0–0.2)

## 2019-11-17 LAB — HEPARIN LEVEL (UNFRACTIONATED)
Heparin Unfractionated: 0.32 IU/mL (ref 0.30–0.70)
Heparin Unfractionated: 0.43 IU/mL (ref 0.30–0.70)

## 2019-11-17 LAB — PLATELET INHIBITION P2Y12: Platelet Function  P2Y12: 156 [PRU] — ABNORMAL LOW (ref 182–335)

## 2019-11-17 LAB — MAGNESIUM: Magnesium: 2 mg/dL (ref 1.7–2.4)

## 2019-11-17 NOTE — Progress Notes (Signed)
Progress Note  Patient Name: William Walker Date of Encounter: 11/17/2019  Primary Cardiologist: Dr. Johnny Bridge  Subjective   No recurrent chest pain; BP better    Inpatient Medications    Scheduled Meds: . amLODipine  5 mg Oral Daily  . aspirin EC  81 mg Oral Daily  . Chlorhexidine Gluconate Cloth  6 each Topical Daily  . insulin aspart  0-15 Units Subcutaneous TID WC  . isosorbide mononitrate  15 mg Oral Daily  . levothyroxine  50 mcg Oral QAC breakfast  . metoprolol succinate  25 mg Oral Daily  . rosuvastatin  20 mg Oral Daily  . sodium chloride flush  3 mL Intravenous Q12H  . sodium chloride flush  3 mL Intravenous Q12H   Continuous Infusions: . sodium chloride    . heparin 1,200 Units/hr (11/17/19 0700)   PRN Meds: sodium chloride, acetaminophen, morphine injection, nitroGLYCERIN, ondansetron (ZOFRAN) IV, sodium chloride flush   Vital Signs    Vitals:   11/17/19 0500 11/17/19 0600 11/17/19 0625 11/17/19 0700  BP: (!) 114/46 130/72  137/69  Pulse: 63 73  71  Resp: 16 (!) 21  17  Temp:   98 F (36.7 C)   TempSrc:   Oral   SpO2: 95% 96%  95%  Weight: 92.2 kg     Height:        Intake/Output Summary (Last 24 hours) at 11/17/2019 0853 Last data filed at 11/17/2019 0700 Gross per 24 hour  Intake 503.02 ml  Output 1050 ml  Net -546.98 ml    I/O since admission: -797  Filed Weights   11/16/19 0036 11/16/19 0500 11/17/19 0500  Weight: 93.8 kg 93.8 kg 92.2 kg    Telemetry    Sinus 78 - Personally Reviewed  ECG    11/17/2019 ECG (independently read by me): Normal sinus rhythm at 66 bpm minute with first-degree AV block.  Improvement in inferolateral ST changes with mild T wave inversion in lead III presently.  11/15/2019 ECG (independently read by me): Sinus rhythm at 96 with 1st degree AV block, with new ST depression not seen on prior ECG from several months ago  Physical Exam   BP 137/69   Pulse 71   Temp 98 F (36.7 C) (Oral)   Resp 17   Ht  5\' 11"  (1.803 m)   Wt 92.2 kg   SpO2 95%   BMI 28.35 kg/m  General: Alert, oriented, no distress.  Skin: normal turgor, no rashes, warm and dry HEENT: Normocephalic, atraumatic. Pupils equal round and reactive to light; sclera anicteric; extraocular muscles intact;  Nose without nasal septal hypertrophy Mouth/Parynx benign; Mallinpatti scale 3 Neck: No JVD, no carotid bruits; normal carotid upstroke Lungs: clear to ausculatation and percussion; no wheezing or rales Chest wall: without tenderness to palpitation Heart: PMI not displaced, RRR, s1 s2 normal, 3/6 late peaking aortic systolic murmur, no diastolic murmur, no rubs, gallops, thrills, or heaves Abdomen: soft, nontender; no hepatosplenomehaly, BS+; abdominal aorta nontender and not dilated by palpation.  Large right inguinal hernia. Back: no CVA tenderness Pulses 2+ Musculoskeletal: full range of motion, normal strength, no joint deformities Extremities: no clubbing cyanosis or edema, Homan's sign negative  Neurologic: grossly nonfocal; Cranial nerves grossly wnl Psychologic: Normal mood and affect   Labs    Chemistry Recent Labs  Lab 11/15/19 1206 11/15/19 1206 11/16/19 0026 11/16/19 0026 11/16/19 0914 11/16/19 0917 11/17/19 0325  NA 134*   < > 135   < > 138  139  139 138  K 4.0   < > 3.6   < > 3.5 3.4*  3.4* 3.8  CL 102  --  101  --   --   --  103  CO2 22  --  24  --   --   --  24  GLUCOSE 225*  --  133*  --   --   --  139*  BUN 20  --  21  --   --   --  19  CREATININE 1.41*  --  1.24  --   --   --  1.27*  CALCIUM 9.2  --  8.7*  --   --   --  8.4*  PROT  --   --   --   --   --   --  5.9*  ALBUMIN  --   --   --   --   --   --  3.2*  AST  --   --   --   --   --   --  29  ALT  --   --   --   --   --   --  16  ALKPHOS  --   --   --   --   --   --  63  BILITOT  --   --   --   --   --   --  1.2  GFRNONAA 43*  --  51*  --   --   --  49*  GFRAA 50*  --  59*  --   --   --  57*  ANIONGAP 10  --  10  --   --   --  11     < > = values in this interval not displayed.     Hematology Recent Labs  Lab 11/15/19 1206 11/15/19 1206 11/16/19 0026 11/16/19 0026 11/16/19 0914 11/16/19 0917 11/17/19 0325  WBC 15.0*  --  14.1*  --   --   --  10.1  RBC 3.90*  --  3.55*  --   --   --  3.71*  HGB 13.5   < > 12.3*   < > 12.2* 11.9*  12.2* 13.0  HCT 40.5   < > 36.0*   < > 36.0* 35.0*  36.0* 37.4*  MCV 103.8*  --  101.4*  --   --   --  100.8*  MCH 34.6*  --  34.6*  --   --   --  35.0*  MCHC 33.3  --  34.2  --   --   --  34.8  RDW 13.5  --  13.4  --   --   --  13.4  PLT 207  --  184  --   --   --  185   < > = values in this interval not displayed.    Cardiac EnzymesNo results for input(s): TROPONINI in the last 168 hours. No results for input(s): TROPIPOC in the last 168 hours.   BNP Recent Labs  Lab 11/15/19 1947  BNP 606.3*     DDimer No results for input(s): DDIMER in the last 168 hours.   Lipid Panel     Component Value Date/Time   CHOL 166 11/16/2019 0026   CHOL 179 05/05/2018 1001   TRIG 88 11/16/2019 0026   TRIG 134 05/05/2018 1001   HDL 47 11/16/2019 0026   HDL 54 05/05/2018 1001  CHOLHDL 3.5 11/16/2019 0026   VLDL 18 11/16/2019 0026   LDLCALC 101 (H) 11/16/2019 0026     Radiology    DG Chest 2 View  Result Date: 11/15/2019 CLINICAL DATA:  Left-sided chest pain over the last 2 days. EXAM: CHEST - 2 VIEW COMPARISON:  10/02/2017 FINDINGS: Heart size is normal. Some aortic atherosclerotic calcification. Retrocardiac density most consistent with a moderate to large hiatal hernia. The lungs are clear. No infiltrate, collapse or effusion. Ordinary degenerative changes affect the spine. IMPRESSION: No active disease. Moderate to large hiatal hernia. Aortic atherosclerosis. Electronically Signed   By: Nelson Chimes M.D.   On: 11/15/2019 12:38   CARDIAC CATHETERIZATION  Result Date: 11/16/2019  Mid LAD to Dist LAD lesion is 20% stenosed.  Ramus lesion is 95% stenosed.  Ost LAD lesion is  99% stenosed.  Ost LM to Mid LM lesion is 75% stenosed.  Prox Cx to Mid Cx lesion is 50% stenosed.  2nd Diag lesion is 90% stenosed.  Prox RCA lesion is 75% stenosed.  Hemodynamic findings consistent with aortic valve stenosis.  TREMAINE EARWOOD is a 86 y.o. male  [id number] LOCATION:  FACILITY: [facility] PHYSICIAN: Quay Burow, M.D. 15-Apr-1927 DATE OF PROCEDURE:  11/16/2019 DATE OF DISCHARGE: CARDIAC CATHETERIZATION History obtained from chart review. William Walker is a 86 y.o. male with with a hx of severe calcific aortic stenosis, multivessel CAD with moderate diffuse disease and patent LAD stent per last LHC 2019, CVD, hypertension, HLD and DM2 who is being seen today in the ER yesterday for chest pain which began on Sunday.  He has chronic nitrate responsive angina.  His troponins rose to 4000.  His EKG showed subtle J-point elevation.  His last 2D echo performed 3 months ago showed severe aortic stenosis.  He is referred for right left heart cath to define his anatomy and physiology.   Mr. Cuneo has had progression of his CAD.  His mid LAD stent is patent.  He has left main/three-vessel disease.  I believe he has at least 75% left main with a 99% ostial LAD 95% proximal to mid ramus branch stenosis as well as progression of disease in his dominant RCA.  He has severe aortic stenosis.  I do not think he is a percutaneous revascularization candidate.  Mynx closure devices were used to hemostatically sealed the right common femoral artery and venous puncture sites.  The patient left lab in stable condition.  Given his left main, severe CAD and the fact that these had chest pain with non-STEMI I am upgrading him to 2H for closer clinical observation. [name]. MD, FACC 11/16/2019 9:52 AM   ECHOCARDIOGRAM COMPLETE  Result Date: 11/16/2019    ECHOCARDIOGRAM REPORT   Patient Name:   Keltin T Kovarik Date of Exam: 11/16/2019 Medical Rec #:  [id number]     Height:       71.0 in Accession #:    [id number]    Weight:        206.8 lb Date of Birth:  10/21/1926      BSA:          2.139 m Patient Age:    89 years      BP:           130/76 mmHg Patient Gender: M             HR:           79  bpm. Exam Location:  Inpatient Procedure: 2D Echo, Cardiac Doppler,  Color Doppler and Intracardiac            Opacification Agent Indications:    Aortic Stenosis 424.1 / 135.0  History:        Patient has prior history of Echocardiogram examinations, most                 recent 05/04/2019. CAD, Aortic Valve Disease,                 Arrythmias:non-specific ST changes, Signs/Symptoms:Chest Pain;                 Risk Factors:Hypertension, Dyslipidemia, Diabetes and Former                 Smoker.  Sonographer:    Vickie Epley RDCS Referring Phys: [place]  1. Left ventricular ejection fraction, by estimation, is 40 to 45%. The left ventricle has normal function. The left ventricle demonstrates regional wall motion abnormalities (see scoring diagram/findings for description). Left ventricular diastolic parameters are consistent with Grade I diastolic dysfunction (impaired relaxation). There is moderate hypokinesis of the left ventricular, entire apical segment.  2. Right ventricular systolic function is normal. The right ventricular size is normal. There is normal pulmonary artery systolic pressure. The estimated right ventricular systolic pressure is 21.3 mmHg.  3. The mitral valve is normal in structure. Mild mitral valve regurgitation.  4. Tricuspid valve regurgitation is mild to moderate.  5. The aortic valve is tricuspid. Aortic valve regurgitation is mild. Severe aortic valve stenosis. Comparison(s): Prior images reviewed side by side. Changes from prior study are noted. The left ventricular function is worsened. The left ventricular wall motion abnormality is is new. Aortic stenosis remains severe, but gradients are slightly lower, likely due to reduced left ventricular systolic function. FINDINGS  Left Ventricle: Left  ventricular ejection fraction, by estimation, is 40 to 45%. The left ventricle has normal function. The left ventricle demonstrates regional wall motion abnormalities. Moderate hypokinesis of the left ventricular, entire apical segment. Definity contrast agent was given IV to delineate the left ventricular endocardial borders. The left ventricular internal cavity size was normal in size. There is no left ventricular hypertrophy. Left ventricular diastolic parameters are consistent with Grade I diastolic dysfunction (impaired relaxation). Right Ventricle: The right ventricular size is normal. No increase in right ventricular wall thickness. Right ventricular systolic function is normal. There is normal pulmonary artery systolic pressure. The tricuspid regurgitant velocity is 2.65 m/s, and  with an assumed right atrial pressure of 3 mmHg, the estimated right ventricular systolic pressure is 08.6 mmHg. Left Atrium: Left atrial size was normal in size. Right Atrium: Right atrial size was normal in size. Pericardium: There is no evidence of pericardial effusion. Mitral Valve: The mitral valve is normal in structure. Mild mitral valve regurgitation. Tricuspid Valve: The tricuspid valve is normal in structure. Tricuspid valve regurgitation is mild to moderate. Aortic Valve: The aortic valve is tricuspid. . There is severe thickening and moderate calcification of the aortic valve. Aortic valve regurgitation is mild. Aortic regurgitation PHT measures 418 msec. Severe aortic stenosis is present. There is severe thickening of the aortic valve. There is moderate calcification of the aortic valve. Aortic valve mean gradient measures 34.0 mmHg. Aortic valve peak gradient measures 54.5 mmHg. Aortic valve area, by VTI measures 0.90 cm. Pulmonic Valve: The pulmonic valve was not well visualized. Pulmonic valve regurgitation is not visualized. Aorta: The aortic root and ascending aorta are structurally normal, with no evidence of  dilitation. IAS/Shunts: No atrial level shunt detected by color flow Doppler.  LEFT VENTRICLE PLAX 2D LVIDd:         5.20 cm      Diastology LVIDs:         4.00 cm      LV e' lateral:   5.08 cm/s LV PW:         1.00 cm      LV E/e' lateral: 11.3 LV IVS:        1.00 cm      LV e' medial:    3.23 cm/s LVOT diam:     2.40 cm      LV E/e' medial:  17.8 LV SV:         75 LV SV Index:   35 LVOT Area:     4.52 cm  LV Volumes (MOD) LV vol d, MOD A2C: 78.8 ml LV vol d, MOD A4C: 136.0 ml LV vol s, MOD A2C: 46.2 ml LV vol s, MOD A4C: 60.6 ml LV SV MOD A2C:     32.6 ml LV SV MOD A4C:     136.0 ml LV SV MOD BP:      57.3 ml RIGHT VENTRICLE RV S prime:     11.00 cm/s TAPSE (M-mode): 1.9 cm LEFT ATRIUM           Index       RIGHT ATRIUM           Index LA diam:      3.00 cm 1.40 cm/m  RA Area:     14.90 cm LA Vol (A2C): 39.4 ml 18.42 ml/m RA Volume:   41.00 ml  19.17 ml/m LA Vol (A4C): 48.3 ml 22.58 ml/m  AORTIC VALVE AV Area (Vmax):    0.91 cm AV Area (Vmean):   0.98 cm AV Area (VTI):     0.90 cm AV Vmax:           369.00 cm/s AV Vmean:          268.000 cm/s AV VTI:            0.830 m AV Peak Grad:      54.5 mmHg AV Mean Grad:      34.0 mmHg LVOT Vmax:         74.20 cm/s LVOT Vmean:        57.900 cm/s LVOT VTI:          0.165 m LVOT/AV VTI ratio: 0.20 AI PHT:            418 msec  AORTA Ao Root diam: 4.00 cm MITRAL VALVE               TRICUSPID VALVE MV Area (PHT): 3.23 cm    TR Peak grad:   28.1 mmHg MV Decel Time: 235 msec    TR Vmax:        265.00 cm/s MV E velocity: 57.40 cm/s MV A velocity: 98.50 cm/s  SHUNTS MV E/A ratio:  0.58        Systemic VTI:  0.16 m                            Systemic Diam: 2.40 cm Dani Gobble Croitoru MD Electronically signed by Sanda Klein MD Signature Date/Time: 11/16/2019/4:48:20 PM    Final    VAS US DOPPLER PRE CABG  Result Date: 11/16/2019 PREOPERATIVE VASCULAR EVALUATION  Indications:      Pre-CABG.  Risk Factors:     Hypertension, hyperlipidemia, Diabetes, coronary artery                    disease. Other Factors:    Aortic valve disease. Comparison Study: 02/03/2019 carotid artery duplex: right 40-59% ICA stenosis,                   total occlusion left ICA. Performing Technologist: Maudry Mayhew MHA, RVT, RDCS, RDMS  Examination Guidelines: A complete evaluation includes B-mode imaging, spectral Doppler, color Doppler, and power Doppler as needed of all accessible portions of each vessel. Bilateral testing is considered an integral part of a complete examination. Limited examinations for reoccurring indications may be performed as noted.  Right Carotid Findings: +----------+-------+-------+--------+---------------------------------+--------+           PSV    EDV    StenosisDescribe                         Comments           cm/s   cm/s                                                     +----------+-------+-------+--------+---------------------------------+--------+ CCA Prox  77     17                                                       +----------+-------+-------+--------+---------------------------------+--------+ CCA Distal132    27             heterogenous, irregular and                                               calcific                                  +----------+-------+-------+--------+---------------------------------+--------+ ICA Prox  294    70     60-79%  heterogenous, calcific and                                                irregular                                 +----------+-------+-------+--------+---------------------------------+--------+ ICA Distal104    29                                                       +----------+-------+-------+--------+---------------------------------+--------+ ECA       341           >50%    heterogenous and calcific                 +----------+-------+-------+--------+---------------------------------+--------+  Portions of this table do not appear on this page.  +----------+--------+-------+----------------+------------+           PSV cm/sEDV cmsDescribe        Arm Pressure +----------+--------+-------+----------------+------------+ Subclavian136            Multiphasic, WNL             +----------+--------+-------+----------------+------------+ +---------+--------+--+--------+--+---------+ VertebralPSV cm/s75EDV cm/s18Antegrade +---------+--------+--+--------+--+---------+ Left Carotid Findings: +----------+-------+-------+--------+---------------------------------+--------+           PSV    EDV    StenosisDescribe                         Comments           cm/s   cm/s                                                     +----------+-------+-------+--------+---------------------------------+--------+ CCA Prox  59     9              heterogenous, irregular and                                               calcific                                  +----------+-------+-------+--------+---------------------------------+--------+ CCA Distal59                                                              +----------+-------+-------+--------+---------------------------------+--------+ ICA Prox                Occludedheterogenous and calcific                 +----------+-------+-------+--------+---------------------------------+--------+ ECA       77                    heterogenous and irregular                +----------+-------+-------+--------+---------------------------------+--------+ +----------+--------+--------+----------------+------------+ SubclavianPSV cm/sEDV cm/sDescribe        Arm Pressure +----------+--------+--------+----------------+------------+           91              Multiphasic, WNL             +----------+--------+--------+----------------+------------+ +---------+--------+--------+--------------+ VertebralPSV cm/sEDV cm/sNot identified  +---------+--------+--------+--------------+  ABI Findings: +--------+------------------+-----+----------+--------+ Right   Rt Pressure (mmHg)IndexWaveform  Comment  +--------+------------------+-----+----------+--------+ [id number]                    triphasic          +--------+------------------+-----+----------+--------+ PTA                            triphasic          +--------+------------------+-----+----------+--------+ DP      52  0.38 monophasic         +--------+------------------+-----+----------+--------+ +--------+------------------+-----+----------+-------+ Left    Lt Pressure (mmHg)IndexWaveform  Comment +--------+------------------+-----+----------+-------+ [id number]                    triphasic         +--------+------------------+-----+----------+-------+ PTA                            triphasic         +--------+------------------+-----+----------+-------+ DP      211               1.55 monophasic        +--------+------------------+-----+----------+-------+ +-------+---------------+----------------+ ABI/TBIToday's ABI/TBIPrevious ABI/TBI +-------+---------------+----------------+ Right  Noncompressible                 +-------+---------------+----------------+ Left   Noncompressible                 +-------+---------------+----------------+  Right Doppler Findings: +-----------+--------+-----+---------+-----------------------------------------+ Site       PressureIndexDoppler  Comments                                  +-----------+--------+-----+---------+-----------------------------------------+ Brachial   117          triphasic                                          +-----------+--------+-----+---------+-----------------------------------------+ Radial                  triphasic                                           +-----------+--------+-----+---------+-----------------------------------------+ Ulnar                   triphasic                                          +-----------+--------+-----+---------+-----------------------------------------+ Palmar Arch                      Signal obliterates with radial                                             compression, is unaffected with ulnar                                      compression                               +-----------+--------+-----+---------+-----------------------------------------+  Left Doppler Findings: +-----------+--------+-----+---------+-----------------------------------------+ Site       PressureIndexDoppler  Comments                                  +-----------+--------+-----+---------+-----------------------------------------+ Brachial   136  triphasic                                          +-----------+--------+-----+---------+-----------------------------------------+ Radial                  triphasic                                          +-----------+--------+-----+---------+-----------------------------------------+ Ulnar                   triphasic                                          +-----------+--------+-----+---------+-----------------------------------------+ Palmar Arch                      Signal decreases >50% with radial                                          compression, is unaffected with ulnar                                      compression.                              +-----------+--------+-----+---------+-----------------------------------------+  Summary: Right Carotid: Velocities in the right ICA are consistent with a 60-79%                stenosis. When compared to prior study, right ICA stenosis has                progressed. Left Carotid: Evidence consistent with a total occlusion of the left ICA. Vertebrals:  Right vertebral artery  demonstrates antegrade flow. Left vertebral              artery was not visualized. Subclavians: Normal flow hemodynamics were seen in bilateral subclavian              arteries. Right ABI: Resting right ankle-brachial index indicates noncompressible right lower extremity arteries, suggestive of likely medial arterial calcification. Left ABI: Elevated left ankle-brachial index indicates likely medial arterial calcification.  Electronically signed by Deitra Mayo MD on 11/16/2019 at 6:20:43 PM.    Final     Cardiac Studies   History obtained from chart review. William Glantz Rossis a 86 y.o.malewith with a hx ofsevere calcific aortic stenosis, multivessel CAD with moderate diffuse disease and patent LAD stent per last LHC 2019, CVD, hypertension, HLD and DM2who is being seen today in the ER yesterday for chest pain which began on Sunday.  He has chronic nitrate responsive angina.  His troponins rose to 4000.  His EKG showed subtle J-point elevation.  His last 2D echo performed 3 months ago showed severe aortic stenosis.  He is referred for right left heart cath to define his anatomy and physiology.   IMPRESSION: Mr. Shomaker has had progression of his CAD.  His mid LAD stent is patent.  He has  left main/three-vessel disease.  I believe he has at least 75% left main with a 99% ostial LAD 95% proximal to mid ramus branch stenosis as well as progression of disease in his dominant RCA.  He has severe aortic stenosis.  I do not think he is a percutaneous revascularization candidate.  Mynx closure devices were used to hemostatically sealed the right common femoral artery and venous puncture sites.  The patient left lab in stable condition.  Given his left main, severe CAD and the fact that these had chest pain with non-STEMI I am upgrading him to [facility] for closer clinical observation.  Right Heart Pressures Hemodynamic findings consistent with aortic valve stenosis. Right atrial pressure-15/13, mean 12 Right  ventricular pressure-40/9 Pulmonary artery pressure-37/19, mean 26 Pulmonary wedge pressure-A-wave 21, V wave 20, mean 19 Cardiac output by Fick was 5.6 L/min with an index of 2.6 L/min/m Cardiac output by thermodilution was 4.2 L/min with an index of 2 L/min/m LVEDP-29 Aortic peak to peak gradient was 33, mean gradient was 30 Aortic valve area by Fick was 0.98 cm and by thermodilution 0.75 cm       ECHO 11/16/2019 IMPRESSIONS  1. Left ventricular ejection fraction, by estimation, is 40 to 45%. The  left ventricle has normal function. The left ventricle demonstrates  regional wall motion abnormalities (see scoring diagram/findings for  description). Left ventricular diastolic  parameters are consistent with Grade I diastolic dysfunction (impaired  relaxation). There is moderate hypokinesis of the left ventricular, entire  apical segment.  2. Right ventricular systolic function is normal. The right ventricular  size is normal. There is normal pulmonary artery systolic pressure. The  estimated right ventricular systolic pressure is 34.1 mmHg.  3. The mitral valve is normal in structure. Mild mitral valve  regurgitation.  4. Tricuspid valve regurgitation is mild to moderate.  5. The aortic valve is tricuspid. Aortic valve regurgitation is mild.  Severe aortic valve stenosis.   Comparison(s): Prior images reviewed side by side. Changes from prior  study are noted. The left ventricular function is worsened. The left  ventricular wall motion abnormality is is new. Aortic stenosis remains  severe, but gradients are slightly lower,  likely due to reduced left ventricular systolic function.   Patient Profile     William Walker is a 86 y.o. male with with a hx of severe calcific aortic stenosis, multivessel CAD with moderate diffuse disease and patent LAD stent per last LHC 2019, CVD, hypertension, HLD and DM2 who was admitted on November 15, 2019 with progressive episodes of daily  chest pain and concern for unstable angina and aortic stenosis progression.  Assessment & Plan    1.  Severe multivessel CAD: Patient underwent cardiac catheterization  which reveals progression of multivessel CAD involving the left main, 99% stenosis at the ostium of the LAD with progressive disease in the ramus, left circumflex, and RCA.  Troponin was greater than 4000 from the initial at 68 and apparently the case was reviewed with Dr. Burt Knack and the patient received 300 mg of Plavix last evening and 75 mg this morning.  No recurrent chest pain on IV heparin.  P2 Y12 test ordered today to see if he is Plavix responsive.  If he is responsive, will most likely need 5-day washout and as per Dr. Jobie Quaker possible surgery Monday.  If he is Plavix unresponsive, hopefully surgery can be performed sooner.  2.  Progressive severe aortic stenosis: Cath data reviewed.  Surgical consultation for CABG and surgical  aortic valve replacement by Dr. Pia Mau appreciated.  I have recommended that the patient undergo surgery particularly with his severe coronary obstructive disease and progressive aortic stenosis with now development of mild LV dysfunction new since his prior echo.  Do's LV function is most likely combination of his severe coronary disease with progressive left ear.  However with Plavix load last evening, will need Plavix washout.  Will check P2Y12 test in several days to see if patient is Plavix responsive or not.  3.  Elevated LVEDP at 29 mm.  Received a dose of Lasix yesterday.  Clear on exam today.  No JVD  4.  Essential  hypertension: Blood pressure yesterday was elevated at 178/54.  Amlodipine 5 mg was added to his medical regimen both for blood pressure as well as ischemic benefit.  Blood pressure today is stable at 117/60.  5.  Hyperlipidemia: We will change statin from pravastatin to rosuvastatin 20 mg daily for more aggressive lipid lowering.  6. Type  2 diabetes mellitus: Hemoglobin A1c  7.4.   7.  Carotid artery disease with last ultrasound February 03, 2019 showing 40 to 59% right internal carotid stenosis and left total occlusion.  Plan to keep in [place] today.  If patient remains stable, stable transfer to cardiac telemetry tomorrow but would continue intravenous heparinization until surgery in light of 99% ostial LAD stenosis.  Signed, Troy Sine, MD, [facility] 11/17/2019, 8:53 AM

## 2019-11-17 NOTE — Progress Notes (Signed)
[place] for heparin Indication: chest pain/ACS  Allergies  Allergen Reactions  . Penicillins Other (See Comments)    Unknown reaction Has patient had a PCN reaction causing immediate rash, facial/tongue/throat swelling, SOB or lightheadedness with hypotension: Unknown Has patient had a PCN reaction causing severe rash involving mucus membranes or skin necrosis: Unknown Has patient had a PCN reaction that required hospitalization: Unknown Has patient had a PCN reaction occurring within the last 10 years: No If all of the above answers are "NO", then may proceed with Cephalosporin use.     Vital Signs: Temp: 97.9 F (36.6 C) (06/16 1119) Temp Source: Oral (06/16 1119) BP: 105/53 (06/16 1200) Pulse Rate: 71 (06/16 1200)  Labs: Recent Labs    11/15/19 1206 11/15/19 1206 11/15/19 1743 11/16/19 0026 11/16/19 0026 11/16/19 0914 11/16/19 0914 11/16/19 0917 11/17/19 0325 11/17/19 1053  HGB 13.5   < >  --  12.3*   < > 12.2*   < > 11.9*  12.2* 13.0  --   HCT 40.5   < >  --  36.0*   < > 36.0*  --  35.0*  36.0* 37.4*  --   PLT 207  --   --  184  --   --   --   --  185  --   HEPARINUNFRC  --   --   --  0.55  --   --   --   --  0.32 0.43  CREATININE 1.41*  --   --  1.24  --   --   --   --  1.27*  --   TROPONINIHS 68*  --  4,003*  --   --   --   --   --   --   --    < > = values in this interval not displayed.    Estimated Creatinine Clearance: 44 mL/min (A) (by C-G formula based on SCr of 1.27 mg/dL (H)).   Medications:  Scheduled:  . amLODipine  5 mg Oral Daily  . aspirin EC  81 mg Oral Daily  . Chlorhexidine Gluconate Cloth  6 each Topical Daily  . insulin aspart  0-15 Units Subcutaneous TID WC  . isosorbide mononitrate  15 mg Oral Daily  . levothyroxine  50 mcg Oral QAC breakfast  . metoprolol succinate  25 mg Oral Daily  . rosuvastatin  20 mg Oral Daily  . sodium chloride flush  3 mL Intravenous Q12H  . sodium chloride flush  3  mL Intravenous Q12H    Assessment: 53 yom presented with worsening of chronic CP and positive troponins. He is now s/p cath with severe AS and left main disease for CVTS consult. Pharmacy consulted to to dose heparin (patient considering CABG/AVR) -heparin level at goal  Goal of Therapy:  Heparin level 0.3-0.7 units/ml Monitor platelets by anticoagulation protocol: Yes   Plan:  -Continue heparin at 1200 units/hr -Daily heparin level and CBC  Hildred Laser, PharmD Clinical Pharmacist **Pharmacist phone directory can now be found on [email or web address] ([email or web address]).  Listed under [place].

## 2019-11-17 NOTE — Progress Notes (Signed)
Bilateral lower extremity vein mapping completed. Refer to "CV Proc" under chart review to view preliminary results.  11/17/2019 2:03 PM Kelby Aline., MHA, RVT, RDCS, RDMS

## 2019-11-17 NOTE — Progress Notes (Signed)
ANTICOAGULATION CONSULT NOTE  Pharmacy Consult for heparin Indication: chest pain/ACS  Assessment: 37 yom presented with worsening of chronic CP and positive troponins. He is now s/p cath with severe AS and left main disease for CVTS consult. Pharmacy consulted to to dose heparin (sheath removed ~ 10am) Heparin level this am 0.32 units/ml  Goal of Therapy:  Heparin level 0.3-0.7 units/ml Monitor platelets by anticoagulation protocol: Yes   Plan:  -Continue heparin at 1200 units/hr -Daily heparin level and CBC  Thanks for allowing pharmacy to be a part of this patient's care.  Excell Seltzer, PharmD Clinical Pharmacist

## 2019-11-17 NOTE — Progress Notes (Signed)
Discussed sternal precautions, IS (2500 mL!), mobility post op and d/c planning with pt and daughter. Receptive. He sts he uses a cane at home. He does have neuropathy and requires using his arms some to get up and down. He will benefit from PT early post op as his goal is to d/c home with family support. Gave materials to review. Will hold ambulation due to LM severity. Encouraged mobility in room and IS. [id number] Yves Dill CES, ACSM 2:25 PM 11/17/2019

## 2019-11-17 NOTE — Progress Notes (Signed)
Patient ID: William Walker, male   DOB: 1927/01/27, 86 y.o.   MRN: [id number] TCTS DAILY ICU PROGRESS NOTE                   [address]            [place] [address]          [phone]   1 Day Post-Op Procedure(s) (LRB): RIGHT/LEFT HEART CATH AND CORONARY ANGIOGRAPHY (N/A)  Total Length of Stay:  LOS: 2 days   Subjective: Eating breakfast, no chest pain today   Objective: Vital signs in last 24 hours: Temp:  [98 F (36.7 C)-98.9 F (37.2 C)] 98 F (36.7 C) (06/16 0625) Pulse Rate:  [45-92] 71 (06/16 0700) Cardiac Rhythm: Normal sinus rhythm (06/16 0400) Resp:  [15-33] 17 (06/16 0700) BP: (94-178)/(42-95) 137/69 (06/16 0700) SpO2:  [92 %-100 %] 95 % (06/16 0700) Weight:  [92.2 kg] 92.2 kg (06/16 0500)  Filed Weights   11/16/19 0036 11/16/19 0500 11/17/19 0500  Weight: 93.8 kg 93.8 kg 92.2 kg    Weight change: -4.1 kg   Hemodynamic parameters for last 24 hours:    Intake/Output from previous day: 06/15 0701 - 06/16 0700 In: 503 [P.O.:240; I.V.:263] Out: 1050 [Urine:1050]  Intake/Output this shift: No intake/output data recorded.  Current Meds: Scheduled Meds: . amLODipine  5 mg Oral Daily  . aspirin EC  81 mg Oral Daily  . Chlorhexidine Gluconate Cloth  6 each Topical Daily  . insulin aspart  0-15 Units Subcutaneous TID WC  . isosorbide mononitrate  15 mg Oral Daily  . levothyroxine  50 mcg Oral QAC breakfast  . metoprolol succinate  25 mg Oral Daily  . rosuvastatin  20 mg Oral Daily  . sodium chloride flush  3 mL Intravenous Q12H  . sodium chloride flush  3 mL Intravenous Q12H   Continuous Infusions: . sodium chloride    . heparin 1,200 Units/hr (11/17/19 0700)   PRN Meds:.sodium chloride, acetaminophen, morphine injection, nitroGLYCERIN, ondansetron (ZOFRAN) IV, sodium chloride flush    Lab Results: CBC: Recent Labs    11/16/19 0026 11/16/19 0914 11/16/19 0917 11/17/19 0325  WBC 14.1*  --   --  10.1  HGB 12.3*   < > 11.9*   12.2* 13.0  HCT 36.0*   < > 35.0*  36.0* 37.4*  PLT 184  --   --  185   < > = values in this interval not displayed.   BMET:  Recent Labs    11/16/19 0026 11/16/19 0914 11/16/19 0917 11/17/19 0325  NA 135   < > 139  139 138  K 3.6   < > 3.4*  3.4* 3.8  CL 101  --   --  103  CO2 24  --   --  24  GLUCOSE 133*  --   --  139*  BUN 21  --   --  19  CREATININE 1.24  --   --  1.27*  CALCIUM 8.7*  --   --  8.4*   < > = values in this interval not displayed.    CMET: Lab Results  Component Value Date   WBC 10.1 11/17/2019   HGB 13.0 11/17/2019   HCT 37.4 (L) 11/17/2019   PLT 185 11/17/2019   GLUCOSE 139 (H) 11/17/2019   CHOL 166 11/16/2019   TRIG 88 11/16/2019   HDL 47 11/16/2019   LDLCALC 101 (H) 11/16/2019   ALT 16 11/17/2019  AST 29 11/17/2019   NA 138 11/17/2019   K 3.8 11/17/2019   CL 103 11/17/2019   CREATININE 1.27 (H) 11/17/2019   BUN 19 11/17/2019   CO2 24 11/17/2019   TSH 2.570 02/11/2019   INR 1.02 10/02/2017   HGBA1C 7.4 (H) 11/15/2019      PT/INR: No results for input(s): LABPROT, INR in the last 72 hours. Radiology: CARDIAC CATHETERIZATION  Result Date: 11/16/2019  Mid LAD to Dist LAD lesion is 20% stenosed.  Ramus lesion is 95% stenosed.  Ost LAD lesion is 99% stenosed.  Ost LM to Mid LM lesion is 75% stenosed.  Prox Cx to Mid Cx lesion is 50% stenosed.  2nd Diag lesion is 90% stenosed.  Prox RCA lesion is 75% stenosed.  Hemodynamic findings consistent with aortic valve stenosis.  William Walker is a 86 y.o. male  [id number] LOCATION:  FACILITY: [facility] PHYSICIAN: Quay Burow, M.D. January 04, 1927 DATE OF PROCEDURE:  11/16/2019 DATE OF DISCHARGE: CARDIAC CATHETERIZATION History obtained from chart review. William Walker is a 86 y.o. male with with a hx of severe calcific aortic stenosis, multivessel CAD with moderate diffuse disease and patent LAD stent per last LHC 2019, CVD, hypertension, HLD and DM2 who is being seen today in the ER yesterday for chest  pain which began on Sunday.  He has chronic nitrate responsive angina.  His troponins rose to 4000.  His EKG showed subtle J-point elevation.  His last 2D echo performed 3 months ago showed severe aortic stenosis.  He is referred for right left heart cath to define his anatomy and physiology.   William Walker has had progression of his CAD.  His mid LAD stent is patent.  He has left main/three-vessel disease.  I believe he has at least 75% left main with a 99% ostial LAD 95% proximal to mid ramus branch stenosis as well as progression of disease in his dominant RCA.  He has severe aortic stenosis.  I do not think he is a percutaneous revascularization candidate.  Mynx closure devices were used to hemostatically sealed the right common femoral artery and venous puncture sites.  The patient left lab in stable condition.  Given his left main, severe CAD and the fact that these had chest pain with non-STEMI I am upgrading him to 2H for closer clinical observation. William Walker. MD, FACC 11/16/2019 9:52 AM   ECHOCARDIOGRAM COMPLETE  Result Date: 11/16/2019    ECHOCARDIOGRAM REPORT   Patient Name:   William Walker Date of Exam: 11/16/2019 Medical Rec #:  [id number]     Height:       71.0 in Accession #:    [id number]    Weight:       206.8 lb Date of Birth:  11/01/1926      BSA:          2.139 m Patient Age:    89 years      BP:           130/76 mmHg Patient Gender: M             HR:           79  bpm. Exam Location:  Inpatient Procedure: 2D Echo, Cardiac Doppler, Color Doppler and Intracardiac            Opacification Agent Indications:    Aortic Stenosis 424.1 / 135.0  History:        Patient has prior history of Echocardiogram examinations, most  recent 05/04/2019. CAD, Aortic Valve Disease,                 Arrythmias:non-specific ST changes, Signs/Symptoms:Chest Pain;                 Risk Factors:Hypertension, Dyslipidemia, Diabetes and Former                 Smoker.  Sonographer:    Vickie Epley RDCS  Referring Phys: [place]  1. Left ventricular ejection fraction, by estimation, is 40 to 45%. The left ventricle has normal function. The left ventricle demonstrates regional wall motion abnormalities (see scoring diagram/findings for description). Left ventricular diastolic parameters are consistent with Grade I diastolic dysfunction (impaired relaxation). There is moderate hypokinesis of the left ventricular, entire apical segment.  2. Right ventricular systolic function is normal. The right ventricular size is normal. There is normal pulmonary artery systolic pressure. The estimated right ventricular systolic pressure is 67.2 mmHg.  3. The mitral valve is normal in structure. Mild mitral valve regurgitation.  4. Tricuspid valve regurgitation is mild to moderate.  5. The aortic valve is tricuspid. Aortic valve regurgitation is mild. Severe aortic valve stenosis. Comparison(s): Prior images reviewed side by side. Changes from prior study are noted. The left ventricular function is worsened. The left ventricular wall motion abnormality is is new. Aortic stenosis remains severe, but gradients are slightly lower, likely due to reduced left ventricular systolic function. FINDINGS  Left Ventricle: Left ventricular ejection fraction, by estimation, is 40 to 45%. The left ventricle has normal function. The left ventricle demonstrates regional wall motion abnormalities. Moderate hypokinesis of the left ventricular, entire apical segment. Definity contrast agent was given IV to delineate the left ventricular endocardial borders. The left ventricular internal cavity size was normal in size. There is no left ventricular hypertrophy. Left ventricular diastolic parameters are consistent with Grade I diastolic dysfunction (impaired relaxation). Right Ventricle: The right ventricular size is normal. No increase in right ventricular wall thickness. Right ventricular systolic function is normal. There is  normal pulmonary artery systolic pressure. The tricuspid regurgitant velocity is 2.65 m/s, and  with an assumed right atrial pressure of 3 mmHg, the estimated right ventricular systolic pressure is 09.4 mmHg. Left Atrium: Left atrial size was normal in size. Right Atrium: Right atrial size was normal in size. Pericardium: There is no evidence of pericardial effusion. Mitral Valve: The mitral valve is normal in structure. Mild mitral valve regurgitation. Tricuspid Valve: The tricuspid valve is normal in structure. Tricuspid valve regurgitation is mild to moderate. Aortic Valve: The aortic valve is tricuspid. . There is severe thickening and moderate calcification of the aortic valve. Aortic valve regurgitation is mild. Aortic regurgitation PHT measures 418 msec. Severe aortic stenosis is present. There is severe thickening of the aortic valve. There is moderate calcification of the aortic valve. Aortic valve mean gradient measures 34.0 mmHg. Aortic valve peak gradient measures 54.5 mmHg. Aortic valve area, by VTI measures 0.90 cm. Pulmonic Valve: The pulmonic valve was not well visualized. Pulmonic valve regurgitation is not visualized. Aorta: The aortic root and ascending aorta are structurally normal, with no evidence of dilitation. IAS/Shunts: No atrial level shunt detected by color flow Doppler.  LEFT VENTRICLE PLAX 2D LVIDd:         5.20 cm      Diastology LVIDs:         4.00 cm      LV e' lateral:   5.08 cm/s LV PW:  1.00 cm      LV E/e' lateral: 11.3 LV IVS:        1.00 cm      LV e' medial:    3.23 cm/s LVOT diam:     2.40 cm      LV E/e' medial:  17.8 LV SV:         75 LV SV Index:   35 LVOT Area:     4.52 cm  LV Volumes (MOD) LV vol d, MOD A2C: 78.8 ml LV vol d, MOD A4C: 136.0 ml LV vol s, MOD A2C: 46.2 ml LV vol s, MOD A4C: 60.6 ml LV SV MOD A2C:     32.6 ml LV SV MOD A4C:     136.0 ml LV SV MOD BP:      57.3 ml RIGHT VENTRICLE RV S prime:     11.00 cm/s TAPSE (M-mode): 1.9 cm LEFT ATRIUM            Index       RIGHT ATRIUM           Index LA diam:      3.00 cm 1.40 cm/m  RA Area:     14.90 cm LA Vol (A2C): 39.4 ml 18.42 ml/m RA Volume:   41.00 ml  19.17 ml/m LA Vol (A4C): 48.3 ml 22.58 ml/m  AORTIC VALVE AV Area (Vmax):    0.91 cm AV Area (Vmean):   0.98 cm AV Area (VTI):     0.90 cm AV Vmax:           369.00 cm/s AV Vmean:          268.000 cm/s AV VTI:            0.830 m AV Peak Grad:      54.5 mmHg AV Mean Grad:      34.0 mmHg LVOT Vmax:         74.20 cm/s LVOT Vmean:        57.900 cm/s LVOT VTI:          0.165 m LVOT/AV VTI ratio: 0.20 AI PHT:            418 msec  AORTA Ao Root diam: 4.00 cm MITRAL VALVE               TRICUSPID VALVE MV Area (PHT): 3.23 cm    TR Peak grad:   28.1 mmHg MV Decel Time: 235 msec    TR Vmax:        265.00 cm/s MV E velocity: 57.40 cm/s MV A velocity: 98.50 cm/s  SHUNTS MV E/A ratio:  0.58        Systemic VTI:  0.16 m                            Systemic Diam: 2.40 cm Dani Gobble Croitoru MD Electronically signed by Sanda Klein MD Signature Date/Time: 11/16/2019/4:48:20 PM    Final    VAS US DOPPLER PRE CABG  Result Date: 11/16/2019 PREOPERATIVE VASCULAR EVALUATION  Indications:      Pre-CABG. Risk Factors:     Hypertension, hyperlipidemia, Diabetes, coronary artery                   disease. Other Factors:    Aortic valve disease. Comparison Study: 02/03/2019 carotid artery duplex: right 40-59% ICA stenosis,  total occlusion left ICA. Performing Technologist: Maudry Mayhew MHA, RVT, RDCS, RDMS  Examination Guidelines: A complete evaluation includes B-mode imaging, spectral Doppler, color Doppler, and power Doppler as needed of all accessible portions of each vessel. Bilateral testing is considered an integral part of a complete examination. Limited examinations for reoccurring indications may be performed as noted.  Right Carotid Findings: +----------+-------+-------+--------+---------------------------------+--------+           PSV    EDV     StenosisDescribe                         Comments           cm/s   cm/s                                                     +----------+-------+-------+--------+---------------------------------+--------+ CCA Prox  77     17                                                       +----------+-------+-------+--------+---------------------------------+--------+ CCA Distal132    27             heterogenous, irregular and                                               calcific                                  +----------+-------+-------+--------+---------------------------------+--------+ ICA Prox  294    70     60-79%  heterogenous, calcific and                                                irregular                                 +----------+-------+-------+--------+---------------------------------+--------+ ICA Distal104    29                                                       +----------+-------+-------+--------+---------------------------------+--------+ ECA       341           >50%    heterogenous and calcific                 +----------+-------+-------+--------+---------------------------------+--------+ Portions of this table do not appear on this page. +----------+--------+-------+----------------+------------+           PSV cm/sEDV cmsDescribe        Arm Pressure +----------+--------+-------+----------------+------------+ Subclavian136            Multiphasic, WNL             +----------+--------+-------+----------------+------------+ +---------+--------+--+--------+--+---------+  VertebralPSV cm/s75EDV cm/s18Antegrade +---------+--------+--+--------+--+---------+ Left Carotid Findings: +----------+-------+-------+--------+---------------------------------+--------+           PSV    EDV    StenosisDescribe                         Comments           cm/s   cm/s                                                      +----------+-------+-------+--------+---------------------------------+--------+ CCA Prox  59     9              heterogenous, irregular and                                               calcific                                  +----------+-------+-------+--------+---------------------------------+--------+ CCA Distal59                                                              +----------+-------+-------+--------+---------------------------------+--------+ ICA Prox                Occludedheterogenous and calcific                 +----------+-------+-------+--------+---------------------------------+--------+ ECA       77                    heterogenous and irregular                +----------+-------+-------+--------+---------------------------------+--------+ +----------+--------+--------+----------------+------------+ SubclavianPSV cm/sEDV cm/sDescribe        Arm Pressure +----------+--------+--------+----------------+------------+           91              Multiphasic, WNL             +----------+--------+--------+----------------+------------+ +---------+--------+--------+--------------+ VertebralPSV cm/sEDV cm/sNot identified +---------+--------+--------+--------------+  ABI Findings: +--------+------------------+-----+----------+--------+ Right   Rt Pressure (mmHg)IndexWaveform  Comment  +--------+------------------+-----+----------+--------+ [id number]                    triphasic          +--------+------------------+-----+----------+--------+ PTA                            triphasic          +--------+------------------+-----+----------+--------+ DP      52                0.38 monophasic         +--------+------------------+-----+----------+--------+ +--------+------------------+-----+----------+-------+ Left    Lt Pressure (mmHg)IndexWaveform  Comment +--------+------------------+-----+----------+-------+ [id number]                     triphasic         +--------+------------------+-----+----------+-------+ PTA  triphasic         +--------+------------------+-----+----------+-------+ DP      211               1.55 monophasic        +--------+------------------+-----+----------+-------+ +-------+---------------+----------------+ ABI/TBIToday's ABI/TBIPrevious ABI/TBI +-------+---------------+----------------+ Right  Noncompressible                 +-------+---------------+----------------+ Left   Noncompressible                 +-------+---------------+----------------+  Right Doppler Findings: +-----------+--------+-----+---------+-----------------------------------------+ Site       PressureIndexDoppler  Comments                                  +-----------+--------+-----+---------+-----------------------------------------+ Brachial   117          triphasic                                          +-----------+--------+-----+---------+-----------------------------------------+ Radial                  triphasic                                          +-----------+--------+-----+---------+-----------------------------------------+ Ulnar                   triphasic                                          +-----------+--------+-----+---------+-----------------------------------------+ Palmar Arch                      Signal obliterates with radial                                             compression, is unaffected with ulnar                                      compression                               +-----------+--------+-----+---------+-----------------------------------------+  Left Doppler Findings: +-----------+--------+-----+---------+-----------------------------------------+ Site       PressureIndexDoppler  Comments                                   +-----------+--------+-----+---------+-----------------------------------------+ Brachial   136          triphasic                                          +-----------+--------+-----+---------+-----------------------------------------+ Radial                  triphasic                                          +-----------+--------+-----+---------+-----------------------------------------+  Ulnar                   triphasic                                          +-----------+--------+-----+---------+-----------------------------------------+ Palmar Arch                      Signal decreases >50% with radial                                          compression, is unaffected with ulnar                                      compression.                              +-----------+--------+-----+---------+-----------------------------------------+  Summary: Right Carotid: Velocities in the right ICA are consistent with a 60-79%                stenosis. When compared to prior study, right ICA stenosis has                progressed. Left Carotid: Evidence consistent with a total occlusion of the left ICA. Vertebrals:  Right vertebral artery demonstrates antegrade flow. Left vertebral              artery was not visualized. Subclavians: Normal flow hemodynamics were seen in bilateral subclavian              arteries. Right ABI: Resting right ankle-brachial index indicates noncompressible right lower extremity arteries, suggestive of likely medial arterial calcification. Left ABI: Elevated left ankle-brachial index indicates likely medial arterial calcification.  Electronically signed by Deitra Mayo MD on 11/16/2019 at 6:20:43 PM.    Final      Assessment/Plan: S/P Procedure(s) (LRB): RIGHT/LEFT HEART CATH AND CORONARY ANGIOGRAPHY (N/A) Progression of right carotid disease 60-79 % - asymptomatic LV function decreased compared to 6 months ago 40-45% Stage d vavle disease    Patient considering CABG/ AVR Renal function stable   [name] 11/17/2019 7:48 AM

## 2019-11-18 DIAGNOSIS — I200 Unstable angina: Secondary | ICD-10-CM

## 2019-11-18 LAB — GLUCOSE, CAPILLARY
Glucose-Capillary: 182 mg/dL — ABNORMAL HIGH (ref 70–99)
Glucose-Capillary: 174 mg/dL — ABNORMAL HIGH (ref 70–99)
Glucose-Capillary: 121 mg/dL — ABNORMAL HIGH (ref 70–99)
Glucose-Capillary: 149 mg/dL — ABNORMAL HIGH (ref 70–99)

## 2019-11-18 LAB — BASIC METABOLIC PANEL
Anion gap: 7 (ref 5–15)
BUN: 21 mg/dL (ref 8–23)
CO2: 24 mmol/L (ref 22–32)
Calcium: 8.4 mg/dL — ABNORMAL LOW (ref 8.9–10.3)
Chloride: 104 mmol/L (ref 98–111)
Creatinine, Ser: 1.17 mg/dL (ref 0.61–1.24)
GFR calc Af Amer: >60 mL/min (ref >60)
GFR calc non Af Amer: 54 mL/min — ABNORMAL LOW (ref >60)
Glucose, Bld: 142 mg/dL — ABNORMAL HIGH (ref 70–99)
Potassium: 3.7 mmol/L (ref 3.5–5.1)
Sodium: 135 mmol/L (ref 135–145)

## 2019-11-18 LAB — CBC
HCT: 36 % — ABNORMAL LOW (ref 39.0–52.0)
Hemoglobin: 12.4 g/dL — ABNORMAL LOW (ref 13.0–17.0)
MCH: 34.8 pg — ABNORMAL HIGH (ref 26.0–34.0)
MCHC: 34.4 g/dL (ref 30.0–36.0)
MCV: 101.1 fL — ABNORMAL HIGH (ref 80.0–100.0)
Platelets: 190 10*3/uL (ref 150–400)
RBC: 3.56 MIL/uL — ABNORMAL LOW (ref 4.22–5.81)
RDW: 13.2 % (ref 11.5–15.5)
WBC: 6.9 10*3/uL (ref 4.0–10.5)
nRBC: 0 % (ref 0.0–0.2)

## 2019-11-18 LAB — HEPARIN LEVEL (UNFRACTIONATED): Heparin Unfractionated: 0.35 IU/mL (ref 0.30–0.70)

## 2019-11-18 LAB — PLATELET INHIBITION P2Y12: Platelet Function  P2Y12: 194 [PRU] (ref 182–335)

## 2019-11-18 MED ORDER — ISOSORBIDE MONONITRATE ER 30 MG PO TB24
30.0000 mg | ORAL_TABLET | Freq: Every day | ORAL | Status: DC
  Administered 2019-11-18 – 2019-11-21 (×4): 30 mg via ORAL
  Filled 2019-11-18 (×4): qty 1

## 2019-11-18 MED ORDER — AMLODIPINE BESYLATE 5 MG PO TABS
2.5000 mg | ORAL_TABLET | Freq: Every day | ORAL | Status: DC
  Administered 2019-11-18 – 2019-11-21 (×4): 2.5 mg via ORAL
  Filled 2019-11-18 (×4): qty 1

## 2019-11-18 NOTE — Progress Notes (Signed)
[facility] for heparin Indication: chest pain/ACS  Allergies  Allergen Reactions   Penicillins Other (See Comments)    Unknown reaction Has patient had a PCN reaction causing immediate rash, facial/tongue/throat swelling, SOB or lightheadedness with hypotension: Unknown Has patient had a PCN reaction causing severe rash involving mucus membranes or skin necrosis: Unknown Has patient had a PCN reaction that required hospitalization: Unknown Has patient had a PCN reaction occurring within the last 10 years: No If all of the above answers are "NO", then may proceed with Cephalosporin use.     Vital Signs: Temp: 98 F (36.7 C) (06/17 0400) Temp Source: Oral (06/17 0400) BP: 121/64 (06/17 0607) Pulse Rate: 51 (06/17 0600)  Labs: Recent Labs    11/15/19 1206 11/15/19 1206 11/15/19 1743 11/16/19 0026 11/16/19 0026 11/16/19 0914 11/16/19 0917 11/17/19 0325 11/17/19 1053 11/18/19 0250  HGB 13.5   < >  --  12.3*   < >   < > 11.9*   12.2* 13.0  --  12.4*  HCT 40.5   < >  --  36.0*  --    < > 35.0*   36.0* 37.4*  --  36.0*  PLT 207   < >  --  184  --   --   --  185  --  190  HEPARINUNFRC  --   --   --  0.55   < >  --   --  0.32 0.43 0.35  CREATININE 1.41*   < >  --  1.24  --   --   --  1.27*  --  1.17  TROPONINIHS 68*  --  4,003*  --   --   --   --   --   --   --    < > = values in this interval not displayed.    Estimated Creatinine Clearance: 47.8 mL/min (by C-G formula based on SCr of 1.17 mg/dL).   Medications:  Scheduled:   amLODipine  5 mg Oral Daily   aspirin EC  81 mg Oral Daily   Chlorhexidine Gluconate Cloth  6 each Topical Daily   insulin aspart  0-15 Units Subcutaneous TID WC   isosorbide mononitrate  15 mg Oral Daily   levothyroxine  50 mcg Oral QAC breakfast   metoprolol succinate  25 mg Oral Daily   rosuvastatin  20 mg Oral Daily   sodium chloride flush  3 mL Intravenous Q12H   sodium chloride flush  3 mL  Intravenous Q12H    Assessment: 53 yom presented with worsening of chronic CP and positive troponins. He is now s/p cath with severe AS and left main disease for CVTS consult. Pharmacy consulted to to dose heparin (patient considering CABG/AVR) -heparin level at goal  Goal of Therapy:  Heparin level 0.3-0.7 units/ml Monitor platelets by anticoagulation protocol: Yes   Plan:  -Continue heparin at 1200 units/hr -Daily heparin level and CBC  Hildred Laser, PharmD Clinical Pharmacist **Pharmacist phone directory can now be found on [email or web address] ([email or web address]).  Listed under [place].

## 2019-11-18 NOTE — Progress Notes (Signed)
Progress Note  Patient Name: William Walker Date of Encounter: 11/18/2019  Primary Cardiologist: Dr. Johnny Walker  Subjective   Patient had an episode of chest pain this morning while eating breakfast.  He required 2 sublingual nitroglycerin for relief.  Inpatient Medications    Scheduled Meds:  amLODipine  5 mg Oral Daily   aspirin EC  81 mg Oral Daily   Chlorhexidine Gluconate Cloth  6 each Topical Daily   insulin aspart  0-15 Units Subcutaneous TID WC   isosorbide mononitrate  15 mg Oral Daily   levothyroxine  50 mcg Oral QAC breakfast   metoprolol succinate  25 mg Oral Daily   rosuvastatin  20 mg Oral Daily   sodium chloride flush  3 mL Intravenous Q12H   sodium chloride flush  3 mL Intravenous Q12H   Continuous Infusions:  sodium chloride     heparin 1,200 Units/hr (11/18/19 0600)   PRN Meds: sodium chloride, acetaminophen, morphine injection, nitroGLYCERIN, ondansetron (ZOFRAN) IV, sodium chloride flush   Vital Signs    Vitals:   11/18/19 0500 11/18/19 0600 11/18/19 0607 11/18/19 0723  BP: (!) 93/53 (!) 90/45 121/64   Pulse: (!) 58 (!) 51    Resp: 16 15 (!) 23   Temp:    98.7 F (37.1 C)  TempSrc:      SpO2: 100% 97%    Weight:      Height:        Intake/Output Summary (Last 24 hours) at 11/18/2019 0846 Last data filed at 11/18/2019 0600 Gross per 24 hour  Intake 626.92 ml  Output 1375 ml  Net -748.08 ml    I/O since admission: -1293  Filed Weights   11/16/19 0500 11/17/19 0500 11/18/19 0400  Weight: 93.8 kg 92.2 kg 92.4 kg    Telemetry    Sinus 87 - Personally Reviewed  ECG    11/17/2019 ECG (independently read by me): Normal sinus rhythm at 66 bpm minute with first-degree AV block.  Improvement in inferolateral ST changes with mild T wave inversion in lead III presently.  11/15/2019 ECG (independently read by me): Sinus rhythm at 96 with 1st degree AV block, with new ST depression not seen on prior ECG from several months  ago  Physical Exam   BP 121/64    Pulse (!) 51    Temp 98.7 F (37.1 C)    Resp (!) 23    Ht 5\' 11"  (1.803 m)    Wt 92.4 kg    SpO2 97%    BMI 28.41 kg/m  General: Alert, oriented, no distress.  Skin: normal turgor, no rashes, warm and dry HEENT: Normocephalic, atraumatic. Pupils equal round and reactive to light; sclera anicteric; extraocular muscles intact;  Nose without nasal septal hypertrophy Mouth/Parynx benign; Mallinpatti scale 3 Neck: No JVD, no carotid bruits; normal carotid upstroke Lungs: clear to ausculatation and percussion; no wheezing or rales Chest wall: without tenderness to palpitation Heart: PMI not displaced, RRR, s1 s2 normal, 1/6 systolic murmur, no diastolic murmur, no rubs, gallops, thrills, or heaves Abdomen: soft, nontender; no hepatosplenomehaly, BS+; abdominal aorta nontender and not dilated by palpation. Large R inguinal hernia Back: no CVA tenderness Pulses 2+ Musculoskeletal: full range of motion, normal strength, no joint deformities Extremities: no clubbing cyanosis or edema, Homan's sign negative  Neurologic: grossly nonfocal; Cranial nerves grossly wnl Psychologic: Normal mood and affect     Labs    Chemistry Recent Labs  Lab 11/16/19 0026 11/16/19 0914 11/16/19 0917 11/17/19  0325 11/18/19 0250  NA 135   < > 139   139 138 135  K 3.6   < > 3.4*   3.4* 3.8 3.7  CL 101  --   --  103 104  CO2 24  --   --  24 24  GLUCOSE 133*  --   --  139* 142*  BUN 21  --   --  19 21  CREATININE 1.24  --   --  1.27* 1.17  CALCIUM 8.7*  --   --  8.4* 8.4*  PROT  --   --   --  5.9*  --   ALBUMIN  --   --   --  3.2*  --   AST  --   --   --  29  --   ALT  --   --   --  16  --   ALKPHOS  --   --   --  63  --   BILITOT  --   --   --  1.2  --   GFRNONAA 51*  --   --  49* 54*  GFRAA 59*  --   --  57* >60  ANIONGAP 10  --   --  11 7   < > = values in this interval not displayed.     Hematology Recent Labs  Lab 11/16/19 0026 11/16/19 0914  11/16/19 0917 11/17/19 0325 11/18/19 0250  WBC 14.1*  --   --  10.1 6.9  RBC 3.55*  --   --  3.71* 3.56*  HGB 12.3*   < > 11.9*   12.2* 13.0 12.4*  HCT 36.0*   < > 35.0*   36.0* 37.4* 36.0*  MCV 101.4*  --   --  100.8* 101.1*  MCH 34.6*  --   --  35.0* 34.8*  MCHC 34.2  --   --  34.8 34.4  RDW 13.4  --   --  13.4 13.2  PLT 184  --   --  185 190   < > = values in this interval not displayed.    Cardiac EnzymesNo results for input(s): TROPONINI in the last 168 hours. No results for input(s): TROPIPOC in the last 168 hours.   BNP Recent Labs  Lab 11/15/19 1947  BNP 606.3*     DDimer No results for input(s): DDIMER in the last 168 hours.   Lipid Panel     Component Value Date/Time   CHOL 166 11/16/2019 0026   CHOL 179 05/05/2018 1001   TRIG 88 11/16/2019 0026   TRIG 134 05/05/2018 1001   HDL 47 11/16/2019 0026   HDL 54 05/05/2018 1001   CHOLHDL 3.5 11/16/2019 0026   VLDL 18 11/16/2019 0026   LDLCALC 101 (H) 11/16/2019 0026     Radiology    CARDIAC CATHETERIZATION  Result Date: 11/16/2019  Mid LAD to Dist LAD lesion is 20% stenosed.  Ramus lesion is 95% stenosed.  Ost LAD lesion is 99% stenosed.  Ost LM to Mid LM lesion is 75% stenosed.  Prox Cx to Mid Cx lesion is 50% stenosed.  2nd Diag lesion is 90% stenosed.  Prox RCA lesion is 75% stenosed.  Hemodynamic findings consistent with aortic valve stenosis.  William Walker is a 86 y.o. male  [id number] LOCATION:  FACILITY: [facility] PHYSICIAN: William Walker, M.D. 27-May-1927 DATE OF PROCEDURE:  11/16/2019 DATE OF DISCHARGE: CARDIAC CATHETERIZATION History obtained from chart review. William Walker is a 86  y.o. male with with a hx of severe calcific aortic stenosis, multivessel CAD with moderate diffuse disease and patent LAD stent per last LHC 2019, CVD, hypertension, HLD and DM2 who is being seen today in the ER yesterday for chest pain which began on Sunday.  He has chronic nitrate responsive angina.  His troponins rose to 4000.   His EKG showed subtle J-point elevation.  His last 2D echo performed 3 months ago showed severe aortic stenosis.  He is referred for right left heart cath to define his anatomy and physiology.   William Walker has had progression of his CAD.  His mid LAD stent is patent.  He has left main/three-vessel disease.  I believe he has at least 75% left main with a 99% ostial LAD 95% proximal to mid ramus branch stenosis as well as progression of disease in his dominant RCA.  He has severe aortic stenosis.  I do not think he is a percutaneous revascularization candidate.  Mynx closure devices were used to hemostatically sealed the right common femoral artery and venous puncture sites.  The patient left lab in stable condition.  Given his left main, severe CAD and the fact that these had chest pain with non-STEMI I am upgrading [facility] to [facility] for closer clinical observation. William Walker. MD, [facility] 11/16/2019 9:52 AM   VAS [place] LOWER EXTREMITY SAPHENOUS VEIN MAPPING  Result Date: 11/17/2019 LOWER EXTREMITY VEIN MAPPING Indications:  Pre-op Risk Factors: Coronary artery disease.  Comparison Study: No prior study Performing Technologist: Maudry Mayhew MHA, RDMS, RVT, RDCS  Examination Guidelines: A complete evaluation includes B-mode imaging, spectral Doppler, color Doppler, and power Doppler as needed of all accessible portions of each vessel. Bilateral testing is considered an integral part of a complete examination. Limited examinations for reoccurring indications may be performed as noted. +---------------+-----------+----------------------+---------------+-----------+    RT Diameter   RT Findings          GSV             LT Diameter   LT Findings        (cm)                                               (cm)                    +---------------+-----------+----------------------+---------------+-----------+       0.29                       Saphenofemoral          0.35                                                         Junction                                     +---------------+-----------+----------------------+---------------+-----------+       0.27                       Proximal thigh          0.20                    +---------------+-----------+----------------------+---------------+-----------+  0.17                         Mid thigh             0.14                    +---------------+-----------+----------------------+---------------+-----------+       0.19                        Distal thigh           0.14                    +---------------+-----------+----------------------+---------------+-----------+       0.15                            Knee               0.16                    +---------------+-----------+----------------------+---------------+-----------+       0.19        branching        Prox calf             0.18        branching   +---------------+-----------+----------------------+---------------+-----------+                                     Mid calf             0.16                    +---------------+-----------+----------------------+---------------+-----------+                                   Distal calf            0.17                    +---------------+-----------+----------------------+---------------+-----------+ +----------------+--------------+--------------+----------------+--------------+  RT diameter (cm)  RT Findings        SSV       LT Diameter (cm)  LT Findings    +----------------+--------------+--------------+----------------+--------------+                   not visualized   Popliteal                     not visualized                                       fossa                                       +----------------+--------------+--------------+----------------+--------------+                   not visualized Proximal calf                   not visualized  +----------------+--------------+--------------+----------------+--------------+                   not visualized  Mid  calf                     not visualized  +----------------+--------------+--------------+----------------+--------------+                   not visualized  Distal calf                    not visualized  +----------------+--------------+--------------+----------------+--------------+ Diagnosing physician: Harold Barban MD Electronically signed by Harold Barban MD on 11/17/2019 at 11:21:01 PM.    Final    ECHOCARDIOGRAM COMPLETE  Result Date: 11/16/2019    ECHOCARDIOGRAM REPORT   Patient Name:   William Walker Date of Exam: 11/16/2019 Medical Rec #:  [id number]     Height:       71.0 in Accession #:    [id number]    Weight:       206.8 lb Date of Birth:  1927/04/06      BSA:          2.139 m Patient Age:    18 years      BP:           130/76 mmHg Patient Gender: M             HR:           79 bpm. Exam Location:  Inpatient Procedure: 2D Echo, Cardiac Doppler, Color Doppler and Intracardiac            Opacification Agent Indications:    Aortic Stenosis 424.1 / 135.0  History:        Patient has prior history of Echocardiogram examinations, most                 recent 05/04/2019. CAD, Aortic Valve Disease,                 Arrythmias:non-specific ST changes, Signs/Symptoms:Chest Pain;                 Risk Factors:Hypertension, Dyslipidemia, Diabetes and Former                 Smoker.  Sonographer:    Vickie Epley RDCS Referring Phys: [place]  1. Left ventricular ejection fraction, by estimation, is 40 to 45%. The left ventricle has normal function. The left ventricle demonstrates regional wall motion abnormalities (see scoring diagram/findings for description). Left ventricular diastolic parameters are consistent with Grade I diastolic dysfunction (impaired relaxation). There is moderate hypokinesis of the left ventricular, entire apical segment.  2. Right ventricular systolic function is normal. The right ventricular size is normal. There is normal pulmonary artery systolic pressure. The estimated  right ventricular systolic pressure is 57.3 mmHg.  3. The mitral valve is normal in structure. Mild mitral valve regurgitation.  4. Tricuspid valve regurgitation is mild to moderate.  5. The aortic valve is tricuspid. Aortic valve regurgitation is mild. Severe aortic valve stenosis. Comparison(s): Prior images reviewed side by side. Changes from prior study are noted. The left ventricular function is worsened. The left ventricular wall motion abnormality is is new. Aortic stenosis remains severe, but gradients are slightly lower, likely due to reduced left ventricular systolic function. FINDINGS  Left Ventricle: Left ventricular ejection fraction, by estimation, is 40 to 45%. The left ventricle has normal function. The left ventricle demonstrates regional wall motion abnormalities. Moderate hypokinesis of the left ventricular, entire apical segment. Definity contrast agent was given IV to delineate the left ventricular endocardial borders. The  left ventricular internal cavity size was normal in size. There is no left ventricular hypertrophy. Left ventricular diastolic parameters are consistent with Grade I diastolic dysfunction (impaired relaxation). Right Ventricle: The right ventricular size is normal. No increase in right ventricular wall thickness. Right ventricular systolic function is normal. There is normal pulmonary artery systolic pressure. The tricuspid regurgitant velocity is 2.65 m/s, and  with an assumed right atrial pressure of 3 mmHg, the estimated right ventricular systolic pressure is 81.4 mmHg. Left Atrium: Left atrial size was normal in size. Right Atrium: Right atrial size was normal in size. Pericardium: There is no evidence of pericardial effusion. Mitral Valve: The mitral valve is normal in structure. Mild mitral valve regurgitation. Tricuspid Valve: The tricuspid valve is normal in structure. Tricuspid valve regurgitation is mild to moderate. Aortic Valve: The aortic valve is tricuspid. .  There is severe thickening and moderate calcification of the aortic valve. Aortic valve regurgitation is mild. Aortic regurgitation PHT measures 418 msec. Severe aortic stenosis is present. There is severe thickening of the aortic valve. There is moderate calcification of the aortic valve. Aortic valve mean gradient measures 34.0 mmHg. Aortic valve peak gradient measures 54.5 mmHg. Aortic valve area, by VTI measures 0.90 cm. Pulmonic Valve: The pulmonic valve was not well visualized. Pulmonic valve regurgitation is not visualized. Aorta: The aortic root and ascending aorta are structurally normal, with no evidence of dilitation. IAS/Shunts: No atrial level shunt detected by color flow Doppler.  LEFT VENTRICLE PLAX 2D LVIDd:         5.20 cm      Diastology LVIDs:         4.00 cm      LV e' lateral:   5.08 cm/s LV PW:         1.00 cm      LV E/e' lateral: 11.3 LV IVS:        1.00 cm      LV e' medial:    3.23 cm/s LVOT diam:     2.40 cm      LV E/e' medial:  17.8 LV SV:         75 LV SV Index:   35 LVOT Area:     4.52 cm  LV Volumes (MOD) LV vol d, MOD A2C: 78.8 ml LV vol d, MOD A4C: 136.0 ml LV vol s, MOD A2C: 46.2 ml LV vol s, MOD A4C: 60.6 ml LV SV MOD A2C:     32.6 ml LV SV MOD A4C:     136.0 ml LV SV MOD BP:      57.3 ml RIGHT VENTRICLE RV S prime:     11.00 cm/s TAPSE (M-mode): 1.9 cm LEFT ATRIUM           Index       RIGHT ATRIUM           Index LA diam:      3.00 cm 1.40 cm/m  RA Area:     14.90 cm LA Vol (A2C): 39.4 ml 18.42 ml/m RA Volume:   41.00 ml  19.17 ml/m LA Vol (A4C): 48.3 ml 22.58 ml/m  AORTIC VALVE AV Area (Vmax):    0.91 cm AV Area (Vmean):   0.98 cm AV Area (VTI):     0.90 cm AV Vmax:           369.00 cm/s AV Vmean:          268.000 cm/s AV VTI:  0.830 m AV Peak Grad:      54.5 mmHg AV Mean Grad:      34.0 mmHg LVOT Vmax:         74.20 cm/s LVOT Vmean:        57.900 cm/s LVOT VTI:          0.165 m LVOT/AV VTI ratio: 0.20 AI PHT:            418 msec  AORTA Ao Root diam: 4.00 cm  MITRAL VALVE               TRICUSPID VALVE MV Area (PHT): 3.23 cm    TR Peak grad:   28.1 mmHg MV Decel Time: 235 msec    TR Vmax:        265.00 cm/s MV E velocity: 57.40 cm/s MV A velocity: 98.50 cm/s  SHUNTS MV E/A ratio:  0.58        Systemic VTI:  0.16 m                            Systemic Diam: 2.40 cm Dani Gobble Croitoru MD Electronically signed by Sanda Klein MD Signature Date/Time: 11/16/2019/4:48:20 PM    Final    VAS US DOPPLER PRE CABG  Result Date: 11/16/2019 PREOPERATIVE VASCULAR EVALUATION  Indications:      Pre-CABG. Risk Factors:     Hypertension, hyperlipidemia, Diabetes, coronary artery                   disease. Other Factors:    Aortic valve disease. Comparison Study: 02/03/2019 carotid artery duplex: right 40-59% ICA stenosis,                   total occlusion left ICA. Performing Technologist: Maudry Mayhew MHA, RVT, RDCS, RDMS  Examination Guidelines: A complete evaluation includes B-mode imaging, spectral Doppler, color Doppler, and power Doppler as needed of all accessible portions of each vessel. Bilateral testing is considered an integral part of a complete examination. Limited examinations for reoccurring indications may be performed as noted.  Right Carotid Findings: +----------+-------+-------+--------+---------------------------------+--------+             PSV     EDV     Stenosis Describe                          Comments              cm/s    cm/s                                                         +----------+-------+-------+--------+---------------------------------+--------+  CCA Prox   77      17                                                           +----------+-------+-------+--------+---------------------------------+--------+  CCA Distal 132     27               heterogenous, irregular and  calcific                                    +----------+-------+-------+--------+---------------------------------+--------+   ICA Prox   294     70      60-79%   heterogenous, calcific and                                                       irregular                                   +----------+-------+-------+--------+---------------------------------+--------+  ICA Distal 104     29                                                           +----------+-------+-------+--------+---------------------------------+--------+  ECA        341             >50%     heterogenous and calcific                   +----------+-------+-------+--------+---------------------------------+--------+ Portions of this table do not appear on this page. +----------+--------+-------+----------------+------------+             PSV cm/s EDV cms Describe         Arm Pressure  +----------+--------+-------+----------------+------------+  Subclavian 136              Multiphasic, WNL               +----------+--------+-------+----------------+------------+ +---------+--------+--+--------+--+---------+  Vertebral PSV cm/s 75 EDV cm/s 18 Antegrade  +---------+--------+--+--------+--+---------+ Left Carotid Findings: +----------+-------+-------+--------+---------------------------------+--------+             PSV     EDV     Stenosis Describe                          Comments              cm/s    cm/s                                                         +----------+-------+-------+--------+---------------------------------+--------+  CCA Prox   59      9                heterogenous, irregular and                                                      calcific                                    +----------+-------+-------+--------+---------------------------------+--------+  CCA Distal 59                                                                   +----------+-------+-------+--------+---------------------------------+--------+  ICA Prox                   Occluded heterogenous and calcific                    +----------+-------+-------+--------+---------------------------------+--------+  ECA        77                       heterogenous and irregular                  +----------+-------+-------+--------+---------------------------------+--------+ +----------+--------+--------+----------------+------------+  Subclavian PSV cm/s EDV cm/s Describe         Arm Pressure  +----------+--------+--------+----------------+------------+             91                Multiphasic, WNL               +----------+--------+--------+----------------+------------+ +---------+--------+--------+--------------+  Vertebral PSV cm/s EDV cm/s Not identified  +---------+--------+--------+--------------+  ABI Findings: +--------+------------------+-----+----------+--------+  Right    Rt Pressure (mmHg) Index Waveform   Comment   +--------+------------------+-----+----------+--------+  Brachial 117                      triphasic            +--------+------------------+-----+----------+--------+  PTA                               triphasic            +--------+------------------+-----+----------+--------+  DP       52                 0.38  monophasic           +--------+------------------+-----+----------+--------+ +--------+------------------+-----+----------+-------+  Left     Lt Pressure (mmHg) Index Waveform   Comment  +--------+------------------+-----+----------+-------+  Brachial 136                      triphasic           +--------+------------------+-----+----------+-------+  PTA                               triphasic           +--------+------------------+-----+----------+-------+  DP       211                1.55  monophasic          +--------+------------------+-----+----------+-------+ +-------+---------------+----------------+  ABI/TBI Today's ABI/TBI Previous ABI/TBI  +-------+---------------+----------------+  Right   Noncompressible                   +-------+---------------+----------------+  Left    Noncompressible                    +-------+---------------+----------------+  Right Doppler Findings: +-----------+--------+-----+---------+-----------------------------------------+  Site        Pressure Index Doppler   Comments                                   +-----------+--------+-----+---------+-----------------------------------------+  Brachial    117            triphasic                                            +-----------+--------+-----+---------+-----------------------------------------+  Radial                     triphasic                                            +-----------+--------+-----+---------+-----------------------------------------+  Ulnar                      triphasic                                            +-----------+--------+-----+---------+-----------------------------------------+  Palmar Arch                          Signal obliterates with radial                                                   compression, is unaffected with ulnar                                            compression                                +-----------+--------+-----+---------+-----------------------------------------+  Left Doppler Findings: +-----------+--------+-----+---------+-----------------------------------------+  Site        Pressure Index Doppler   Comments                                   +-----------+--------+-----+---------+-----------------------------------------+  Brachial    136            triphasic                                            +-----------+--------+-----+---------+-----------------------------------------+  Radial                     triphasic                                            +-----------+--------+-----+---------+-----------------------------------------+  Ulnar                      triphasic                                            +-----------+--------+-----+---------+-----------------------------------------+  Palmar Arch                          Signal decreases >50% with  radial                                                compression, is unaffected with ulnar                                            compression.                               +-----------+--------+-----+---------+-----------------------------------------+  Summary: Right Carotid: Velocities in the right ICA are consistent with a 60-79%                stenosis. When compared to prior study, right ICA stenosis has                progressed. Left Carotid: Evidence consistent with a total occlusion of the left ICA. Vertebrals:  Right vertebral artery demonstrates antegrade flow. Left vertebral              artery was not visualized. Subclavians: Normal flow hemodynamics were seen in bilateral subclavian              arteries. Right ABI: Resting right ankle-brachial index indicates noncompressible right lower extremity arteries, suggestive of likely medial arterial calcification. Left ABI: Elevated left ankle-brachial index indicates likely medial arterial calcification.  Electronically signed by Deitra Mayo MD on 11/16/2019 at 6:20:43 PM.    Final     Cardiac Studies   History obtained from chart review. William Walker a 86 y.o.malewith with a hx ofsevere calcific aortic stenosis, multivessel CAD with moderate diffuse disease and patent LAD stent per last LHC 2019, CVD, hypertension, HLD and DM2who is being seen today in the ER yesterday for chest pain which began on Sunday.  He has chronic nitrate responsive angina.  His troponins rose to 4000.  His EKG showed subtle J-point elevation.  His last 2D echo performed 3 months ago showed severe aortic stenosis.  He is referred for right left heart cath to define his anatomy and physiology.   IMPRESSION: Mr. Artman has had progression of his CAD.  His mid LAD stent is patent.  He has left main/three-vessel disease.  I believe he has at least 75% left main with a 99% ostial LAD 95% proximal to mid ramus branch stenosis as well as progression of disease  in his dominant RCA.  He has severe aortic stenosis.  I do not think he is a percutaneous revascularization candidate.  Mynx closure devices were used to hemostatically sealed the right common femoral artery and venous puncture sites.  The patient left lab in stable condition.  Given his left main, severe CAD and the fact that these had chest pain with non-STEMI I am upgrading him to [facility] for closer clinical observation.  Right Heart Pressures Hemodynamic findings consistent with aortic valve stenosis. Right atrial pressure-15/13, mean 12 Right ventricular pressure-40/9 Pulmonary artery pressure-37/19, mean 26 Pulmonary wedge pressure-A-wave 21, V wave 20, mean 19 Cardiac output by Fick was 5.6 L/min with an index  of 2.6 L/min/m Cardiac output by thermodilution was 4.2 L/min with an index of 2 L/min/m LVEDP-29 Aortic peak to peak gradient was 33, mean gradient was 30 Aortic valve area by Fick was 0.98 cm and by thermodilution 0.75 cm       ECHO 11/16/2019 IMPRESSIONS  1. Left ventricular ejection fraction, by estimation, is 40 to 45%. The  left ventricle has normal function. The left ventricle demonstrates  regional wall motion abnormalities (see scoring diagram/findings for  description). Left ventricular diastolic  parameters are consistent with Grade I diastolic dysfunction (impaired  relaxation). There is moderate hypokinesis of the left ventricular, entire  apical segment.  2. Right ventricular systolic function is normal. The right ventricular  size is normal. There is normal pulmonary artery systolic pressure. The  estimated right ventricular systolic pressure is 42.3 mmHg.  3. The mitral valve is normal in structure. Mild mitral valve  regurgitation.  4. Tricuspid valve regurgitation is mild to moderate.  5. The aortic valve is tricuspid. Aortic valve regurgitation is mild.  Severe aortic valve stenosis.   Comparison(s): Prior images reviewed side by side. Changes from  prior  study are noted. The left ventricular function is worsened. The left  ventricular wall motion abnormality is is new. Aortic stenosis remains  severe, but gradients are slightly lower,  likely due to reduced left ventricular systolic function.   Patient Profile     William Walker is a 86 y.o. male with with a hx of severe calcific aortic stenosis, multivessel CAD with moderate diffuse disease and patent LAD stent per last LHC 2019, CVD, hypertension, HLD and DM2 who was admitted on November 15, 2019 with progressive episodes of daily chest pain and concern for unstable angina and aortic stenosis progression.  Assessment & Plan    1.  Severe multivessel CAD: Patient underwent cardiac catheterization  which reveals progression of multivessel CAD involving the left main, 99% stenosis at the ostium of the LAD with progressive disease in the ramus, left circumflex, and RCA.  Troponin was greater than 4000 from the initial at 68 and apparently the case was reviewed with Dr. Burt Knack and the patient received 300 mg of Plavix last evening and 75 mg on 6/15.   P2 Y12 test 156 yesterday.  We will recheck later this afternoon to see if he would be ready for surgery on Friday rather than waiting until Monday.  With recurrent chest pain this morning will slightly increase isosorbide to 30 mg but since blood pressure is on the low side decrease amlodipine to 2.5 mg.  We will recheck ECG today.  2.  Progressive severe aortic stenosis: Cath data reviewed.  Surgical consultation for CABG and surgical aortic valve replacement by Dr. Pia Mau appreciated.  I have recommended that the patient undergo surgery particularly with his severe coronary obstructive disease and progressive aortic stenosis with now development of mild LV dysfunction new since his prior echo.  LV dysfunction is most likely combination of his severe coronary disease with progressive AS.    3.  Elevated LVEDP at 29 mm.  Received a dose of Lasix  yesterday.  Clear on exam today.  No JVD  4.  Essential  hypertension: Blood pressure yesterday was elevated at 178/54.  Amlodipine 5 mg was added to his medical regimen both for blood pressure as well as ischemic benefit.  Blood pressure earlier today was 128/68 previously was 104/68.  5.  Hyperlipidemia: We will change statin from pravastatin to rosuvastatin 20 mg daily  for more aggressive lipid lowering.  6. Type  2 diabetes mellitus: Hemoglobin A1c 7.4.   7.  Carotid artery disease with last ultrasound February 03, 2019 showing 40 to 59% right internal carotid stenosis and left total occlusion.  Vascular study done yesterday continues to show occluded left carotid but demonstrates progression of right ICA stenoses now in range of 60 - 79%.  With recurrent rest angina today while eating breakfast keep in [place] until surgery.  If recurrent chest pain develops may need surgery tomorrow, will check P2 Y 12 test later today to see if Plavix  responsiveness reversal is further improved.  Signed, Troy Sine, MD, [facility] 11/18/2019, 8:46 AM

## 2019-11-19 ENCOUNTER — Encounter (HOSPITAL_COMMUNITY): Payer: Self-pay | Admitting: Cardiovascular Disease

## 2019-11-19 ENCOUNTER — Other Ambulatory Visit: Payer: Self-pay

## 2019-11-19 ENCOUNTER — Encounter (HOSPITAL_COMMUNITY): Payer: Medicare Other

## 2019-11-19 LAB — CBC
HCT: 34.8 % — ABNORMAL LOW (ref 39.0–52.0)
Hemoglobin: 11.7 g/dL — ABNORMAL LOW (ref 13.0–17.0)
MCH: 34.1 pg — ABNORMAL HIGH (ref 26.0–34.0)
MCHC: 33.6 g/dL (ref 30.0–36.0)
MCV: 101.5 fL — ABNORMAL HIGH (ref 80.0–100.0)
Platelets: 192 10*3/uL (ref 150–400)
RBC: 3.43 MIL/uL — ABNORMAL LOW (ref 4.22–5.81)
RDW: 13.2 % (ref 11.5–15.5)
WBC: 6.4 10*3/uL (ref 4.0–10.5)
nRBC: 0 % (ref 0.0–0.2)

## 2019-11-19 LAB — BASIC METABOLIC PANEL
Anion gap: 9 (ref 5–15)
BUN: 20 mg/dL (ref 8–23)
CO2: 23 mmol/L (ref 22–32)
Calcium: 8.4 mg/dL — ABNORMAL LOW (ref 8.9–10.3)
Chloride: 104 mmol/L (ref 98–111)
Creatinine, Ser: 1.15 mg/dL (ref 0.61–1.24)
GFR calc Af Amer: >60 mL/min (ref >60)
GFR calc non Af Amer: 55 mL/min — ABNORMAL LOW (ref >60)
Glucose, Bld: 140 mg/dL — ABNORMAL HIGH (ref 70–99)
Potassium: 3.6 mmol/L (ref 3.5–5.1)
Sodium: 136 mmol/L (ref 135–145)

## 2019-11-19 LAB — GLUCOSE, CAPILLARY
Glucose-Capillary: 148 mg/dL — ABNORMAL HIGH (ref 70–99)
Glucose-Capillary: 137 mg/dL — ABNORMAL HIGH (ref 70–99)
Glucose-Capillary: 146 mg/dL — ABNORMAL HIGH (ref 70–99)
Glucose-Capillary: 150 mg/dL — ABNORMAL HIGH (ref 70–99)

## 2019-11-19 LAB — HEPARIN LEVEL (UNFRACTIONATED): Heparin Unfractionated: 0.48 IU/mL (ref 0.30–0.70)

## 2019-11-19 MED ORDER — NITROGLYCERIN IN D5W 200-5 MCG/ML-% IV SOLN
2.0000 ug/min | INTRAVENOUS | Status: DC
  Filled 2019-11-19: qty 250

## 2019-11-19 MED ORDER — TRANEXAMIC ACID 1000 MG/10ML IV SOLN
1.5000 mg/kg/h | INTRAVENOUS | Status: AC
  Administered 2019-11-22: 1.5 mg/kg/h via INTRAVENOUS
  Filled 2019-11-19 (×2): qty 25

## 2019-11-19 MED ORDER — LEVOFLOXACIN IN D5W 500 MG/100ML IV SOLN
500.0000 mg | INTRAVENOUS | Status: AC
  Administered 2019-11-22: 500 mg via INTRAVENOUS
  Filled 2019-11-19: qty 100

## 2019-11-19 MED ORDER — MAGNESIUM SULFATE 50 % IJ SOLN
40.0000 meq | INTRAMUSCULAR | Status: DC
  Filled 2019-11-19: qty 9.85

## 2019-11-19 MED ORDER — SODIUM CHLORIDE 0.9 % IV SOLN
INTRAVENOUS | Status: DC
  Filled 2019-11-19: qty 30

## 2019-11-19 MED ORDER — EPINEPHRINE HCL 5 MG/250ML IV SOLN IN NS
0.0000 ug/min | INTRAVENOUS | Status: DC
  Filled 2019-11-19: qty 250

## 2019-11-19 MED ORDER — TRANEXAMIC ACID (OHS) BOLUS VIA INFUSION
15.0000 mg/kg | INTRAVENOUS | Status: AC
  Administered 2019-11-22: 1399.5 mg via INTRAVENOUS
  Filled 2019-11-19: qty 1400

## 2019-11-19 MED ORDER — PLASMA-LYTE 148 IV SOLN
INTRAVENOUS | Status: DC
  Filled 2019-11-19: qty 2.5

## 2019-11-19 MED ORDER — DEXMEDETOMIDINE HCL IN NACL 400 MCG/100ML IV SOLN
0.1000 ug/kg/h | INTRAVENOUS | Status: AC
  Administered 2019-11-22: .7 ug/kg/h via INTRAVENOUS
  Filled 2019-11-19: qty 100

## 2019-11-19 MED ORDER — INSULIN REGULAR(HUMAN) IN NACL 100-0.9 UT/100ML-% IV SOLN
INTRAVENOUS | Status: AC
  Administered 2019-11-22: 3.6 [IU]/h via INTRAVENOUS
  Filled 2019-11-19: qty 100

## 2019-11-19 MED ORDER — TRANEXAMIC ACID (OHS) PUMP PRIME SOLUTION
2.0000 mg/kg | INTRAVENOUS | Status: DC
  Filled 2019-11-19: qty 1.87

## 2019-11-19 MED ORDER — MILRINONE LACTATE IN DEXTROSE 20-5 MG/100ML-% IV SOLN
0.3000 ug/kg/min | INTRAVENOUS | Status: AC
  Administered 2019-11-22: .3 ug/kg/min via INTRAVENOUS
  Administered 2019-11-22: 4665 ug via INTRAVENOUS
  Filled 2019-11-19: qty 100

## 2019-11-19 MED ORDER — VANCOMYCIN HCL 1500 MG/300ML IV SOLN
1500.0000 mg | INTRAVENOUS | Status: AC
  Administered 2019-11-22: 1500 mg via INTRAVENOUS
  Filled 2019-11-19: qty 300

## 2019-11-19 MED ORDER — NOREPINEPHRINE 4 MG/250ML-% IV SOLN
0.0000 ug/min | INTRAVENOUS | Status: AC
  Administered 2019-11-22: 2 ug/min via INTRAVENOUS
  Filled 2019-11-19: qty 250

## 2019-11-19 MED ORDER — PHENYLEPHRINE HCL-NACL 20-0.9 MG/250ML-% IV SOLN
30.0000 ug/min | INTRAVENOUS | Status: DC
  Filled 2019-11-19: qty 250

## 2019-11-19 MED ORDER — POTASSIUM CHLORIDE 2 MEQ/ML IV SOLN
80.0000 meq | INTRAVENOUS | Status: DC
  Filled 2019-11-19: qty 40

## 2019-11-19 NOTE — Progress Notes (Signed)
[facility] DAILY ICU PROGRESS NOTE                   [address]            [place] [address]          [phone]   3 Days Post-Op Procedure(s) (LRB): RIGHT/LEFT HEART CATH AND CORONARY ANGIOGRAPHY (N/A)  Total Length of Stay:  LOS: 4 days   Subjective: No chest pain today, broke off bridged caps eating toast today   Objective: Vital signs in last 24 hours: Temp:  [97.7 F (36.5 C)-98.5 F (36.9 C)] 98.2 F (36.8 C) (06/18 1126) Pulse Rate:  [58-85] 73 (06/18 1200) Cardiac Rhythm: Normal sinus rhythm (06/18 1200) Resp:  [11-26] 17 (06/18 1200) BP: (89-167)/(54-88) 131/71 (06/18 1200) SpO2:  [93 %-100 %] 97 % (06/18 1200) Weight:  [93.3 kg] 93.3 kg (06/18 0509)  Filed Weights   11/17/19 0500 11/18/19 0400 11/19/19 0509  Weight: 92.2 kg 92.4 kg 93.3 kg    Weight change: 0.9 kg   Hemodynamic parameters for last 24 hours:    Intake/Output from previous day: 06/17 0701 - 06/18 0700 In: 781.4 [P.O.:480; I.V.:301.4] Out: 1151 [Urine:1150; Stool:1]  Intake/Output this shift: Total I/O In: 59.2 [I.V.:59.2] Out: 200 [Urine:200]  Current Meds: Scheduled Meds: . amLODipine  2.5 mg Oral Daily  . aspirin EC  81 mg Oral Daily  . Chlorhexidine Gluconate Cloth  6 each Topical Daily  . [START ON 11/22/2019] epinephrine  0-10 mcg/min Intravenous To OR  . [START ON 11/22/2019] heparin-papaverine-plasmalyte irrigation   Irrigation To OR  . insulin aspart  0-15 Units Subcutaneous TID WC  . [START ON 11/22/2019] insulin   Intravenous To OR  . isosorbide mononitrate  30 mg Oral Daily  . levothyroxine  50 mcg Oral QAC breakfast  . [START ON 11/22/2019] magnesium sulfate  40 mEq Other To OR  . metoprolol succinate  25 mg Oral Daily  . [START ON 11/22/2019] phenylephrine  30-200 mcg/min Intravenous To OR  . [START ON 11/22/2019] potassium chloride  80 mEq Other To OR  . rosuvastatin  20 mg Oral Daily  . sodium chloride flush  3 mL Intravenous Q12H  . sodium chloride  flush  3 mL Intravenous Q12H  . [START ON 11/22/2019] tranexamic acid  15 mg/kg Intravenous To OR  . [START ON 11/22/2019] tranexamic acid  2 mg/kg Intracatheter To OR   Continuous Infusions: . sodium chloride    . [START ON 11/22/2019] dexmedetomidine    . [START ON 11/22/2019] heparin 30,000 units/NS 1000 mL solution for CELLSAVER    . heparin 1,200 Units/hr (11/19/19 1200)  . [START ON 11/22/2019] levofloxacin (LEVAQUIN) IV    . [START ON 11/22/2019] milrinone    . [START ON 11/22/2019] nitroGLYCERIN    . [START ON 11/22/2019] norepinephrine    . [START ON 11/22/2019] tranexamic acid (CYKLOKAPRON) infusion ([facility])    . [START ON 11/22/2019] vancomycin     PRN Meds:.sodium chloride, acetaminophen, morphine injection, nitroGLYCERIN, ondansetron (ZOFRAN) IV, sodium chloride flush  General appearance: alert, cooperative, appears stated age and no distress Neurologic: intact Heart: regular rate and rhythm and systolic murmur: holosystolic 2/6, crescendo at 2nd left intercostal space Lungs: clear to auscultation bilaterally Abdomen: soft, non-tender; bowel sounds normal; no masses,  no organomegaly Extremities: extremities normal, atraumatic, no cyanosis or edema Large inguinal hernia   Lab Results: CBC: Recent Labs    11/18/19 0250 11/19/19 0248  WBC 6.9 6.4  HGB  12.4* 11.7*  HCT 36.0* 34.8*  PLT 190 192   BMET:  Recent Labs    11/18/19 0250 11/19/19 0248  NA 135 136  K 3.7 3.6  CL 104 104  CO2 24 23  GLUCOSE 142* 140*  BUN 21 20  CREATININE 1.17 1.15  CALCIUM 8.4* 8.4*    CMET: Lab Results  Component Value Date   WBC 6.4 11/19/2019   HGB 11.7 (L) 11/19/2019   HCT 34.8 (L) 11/19/2019   PLT 192 11/19/2019   GLUCOSE 140 (H) 11/19/2019   CHOL 166 11/16/2019   TRIG 88 11/16/2019   HDL 47 11/16/2019   LDLCALC 101 (H) 11/16/2019   ALT 16 11/17/2019   AST 29 11/17/2019   NA 136 11/19/2019   K 3.6 11/19/2019   CL 104 11/19/2019   CREATININE 1.15 11/19/2019   BUN 20  11/19/2019   CO2 23 11/19/2019   TSH 2.570 02/11/2019   INR 1.02 10/02/2017   HGBA1C 7.4 (H) 11/15/2019      PT/INR: No results for input(s): LABPROT, INR in the last 72 hours. Radiology: No results found.   Assessment/Plan: S/P Procedure(s) (LRB): RIGHT/LEFT HEART CATH AND CORONARY ANGIOGRAPHY (N/A)  Patient says  he would like to proceed with CABG, AVR with tissue valve , p2y12 now 190 With critical AS and CAD and on heparin ,  little can be done for dental issues now Patient and family aware of increased risk of surgery due to age, patient is very determined to progress after surgery and would like to proceed .  Plan surgery on Monday        [name] 11/19/2019 1:29 PM

## 2019-11-19 NOTE — Progress Notes (Signed)
   11/19/19 1100  Clinical Encounter Type  Visited With Patient and family together  Visit Type Follow-up   Chaplain engaged in follow-up visit with Mr. Markell and his daughter.  Mr. Mizuno shared that he recently lost three of his teeth.  He explained to chaplain that when he was growing up in the 30[place] on a farm, his mom and dad would pull all of their teeth and take care of their dental needs.  He did not see a dentist until he was about 72.  His teeth coming out seems to be a source of anxiety for Mr. Ericsson and his daughter as he prepares for a major surgery on Monday.  Mr. Iovino became emotional recounting his upbringing and thinking about the surgery on Monday.    Mr.  Grafton' daughter shared that he is the oldest of 8 children who are still living.  She has served as a great support to her father as she has seen his emotions change.  She declared for her father that he is a [place] and either way he know where he is going referring to the afterlife.  She also declared that they both want him to continue living and going. Chaplain provided the ministries of listening and presence as Mr. Otting shared his childhood and feelings.    Chaplain will follow-up this weekend before the surgery to offer prayer and support.

## 2019-11-19 NOTE — Progress Notes (Signed)
[place] for heparin Indication: chest pain/ACS  Allergies  Allergen Reactions  . Penicillins Other (See Comments)    Unknown reaction Has patient had a PCN reaction causing immediate rash, facial/tongue/throat swelling, SOB or lightheadedness with hypotension: Unknown Has patient had a PCN reaction causing severe rash involving mucus membranes or skin necrosis: Unknown Has patient had a PCN reaction that required hospitalization: Unknown Has patient had a PCN reaction occurring within the last 10 years: No If all of the above answers are "NO", then may proceed with Cephalosporin use.     Vital Signs: Temp: 98.5 F (36.9 C) (06/18 0400) Temp Source: Oral (06/18 0400) BP: 120/82 (06/18 0800) Pulse Rate: 79 (06/18 0800)  Labs: Recent Labs    11/17/19 0325 11/17/19 0325 11/17/19 1053 11/18/19 0250 11/19/19 0248  HGB 13.0   < >  --  12.4* 11.7*  HCT 37.4*  --   --  36.0* 34.8*  PLT 185  --   --  190 192  HEPARINUNFRC 0.32   < > 0.43 0.35 0.48  CREATININE 1.27*  --   --  1.17 1.15   < > = values in this interval not displayed.    Estimated Creatinine Clearance: 48.8 mL/min (by C-G formula based on SCr of 1.15 mg/dL).   Medications:  Scheduled:  . amLODipine  2.5 mg Oral Daily  . aspirin EC  81 mg Oral Daily  . Chlorhexidine Gluconate Cloth  6 each Topical Daily  . insulin aspart  0-15 Units Subcutaneous TID WC  . isosorbide mononitrate  30 mg Oral Daily  . levothyroxine  50 mcg Oral QAC breakfast  . metoprolol succinate  25 mg Oral Daily  . rosuvastatin  20 mg Oral Daily  . sodium chloride flush  3 mL Intravenous Q12H  . sodium chloride flush  3 mL Intravenous Q12H    Assessment: 56 yom presented with worsening of chronic CP and positive troponins. He is now s/p cath with severe AS and left main disease for tentative surgery on 6/21 after plavix washout. Pharmacy consulted to to dose heparin.  Heparin level continues to be at  goal this morning. Hgb continues to trend down to 11.7, plt wnl. No bleeding issues noted.    Goal of Therapy:  Heparin level 0.3-0.7 units/ml Monitor platelets by anticoagulation protocol: Yes   Plan:  -Continue heparin at 1200 units/hr -Daily heparin level and CBC  Erin Hearing PharmD., BCPS Clinical Pharmacist 11/19/2019 8:53 AM

## 2019-11-19 NOTE — Progress Notes (Signed)
Progress Note  Patient Name: William Walker Date of Encounter: 11/19/2019  Primary Cardiologist: Dr. Johnny Bridge  Subjective   Had one additional mild episode of chest pain this morning when attempting to get back into bed, resolved spontaneously without need for additional sublingual nitroglycerin.  Inpatient Medications    Scheduled Meds: . amLODipine  2.5 mg Oral Daily  . aspirin EC  81 mg Oral Daily  . Chlorhexidine Gluconate Cloth  6 each Topical Daily  . insulin aspart  0-15 Units Subcutaneous TID WC  . isosorbide mononitrate  30 mg Oral Daily  . levothyroxine  50 mcg Oral QAC breakfast  . metoprolol succinate  25 mg Oral Daily  . rosuvastatin  20 mg Oral Daily  . sodium chloride flush  3 mL Intravenous Q12H  . sodium chloride flush  3 mL Intravenous Q12H   Continuous Infusions: . sodium chloride    . heparin 1,200 Units/hr (11/19/19 0800)   PRN Meds: sodium chloride, acetaminophen, morphine injection, nitroGLYCERIN, ondansetron (ZOFRAN) IV, sodium chloride flush   Vital Signs    Vitals:   11/19/19 0509 11/19/19 0600 11/19/19 0700 11/19/19 0800  BP:  (!) 107/55 (!) 148/74 120/82  Pulse:  60 72 79  Resp:  15 17 (!) 22  Temp:      TempSrc:      SpO2:  95% 97% 94%  Weight: 93.3 kg     Height:        Intake/Output Summary (Last 24 hours) at 11/19/2019 0913 Last data filed at 11/19/2019 0800 Gross per 24 hour  Intake 517.38 ml  Output 1151 ml  Net -633.62 ml    I/O since admission: -1651   Filed Weights   11/17/19 0500 11/18/19 0400 11/19/19 0509  Weight: 92.2 kg 92.4 kg 93.3 kg    Telemetry    Sinus 87 - Personally Reviewed  ECG    11/19/2019 ECG (independently read by me): Normal sinus rhythm at 64 bpm, left anterior hemiblock. First-degree block with PR interval 286 ms. T wave inversion lead III, nonspecific ST changes V4 through V6  11/17/2019 ECG (independently read by me): Normal sinus rhythm at 66 bpm minute with first-degree AV block.   Improvement in inferolateral ST changes with mild T wave inversion in lead III presently.  11/15/2019 ECG (independently read by me): Sinus rhythm at 96 with 1st degree AV block, with new ST depression not seen on prior ECG from several months ago  Physical Exam    BP 120/82 (BP Location: Right Arm)   Pulse 79   Temp 98.5 F (36.9 C) (Oral)   Resp (!) 22   Ht 5\' 11"  (1.803 m)   Wt 93.3 kg   SpO2 94%   BMI 28.69 kg/m  General: Alert, oriented, no distress.  Skin: normal turgor, no rashes, warm and dry HEENT: Normocephalic, atraumatic. Pupils equal round and reactive to light; sclera anicteric; extraocular muscles intact;  Nose without nasal septal hypertrophy Mouth/Parynx benign;  Neck: No JVD, no carotid bruits; normal carotid upstroke Lungs: clear to ausculatation and percussion; no wheezing or rales Chest wall: without tenderness to palpitation Heart: PMI not displaced, RRR, s1 s2 normal, 3/6 late peaking systolic murmur, no diastolic murmur, no rubs, gallops, thrills, or heaves Abdomen: soft, nontender; no hepatosplenomehaly, BS+; abdominal aorta nontender and not dilated by palpation. Large right inguinal hernia Back: no CVA tenderness Pulses 2+ Musculoskeletal: full range of motion, normal strength, no joint deformities Extremities: no clubbing cyanosis or edema, Homan's sign  negative  Neurologic: grossly nonfocal; Cranial nerves grossly wnl Psychologic: Normal mood and affect   Labs    Chemistry Recent Labs  Lab 11/17/19 0325 11/18/19 0250 11/19/19 0248  NA 138 135 136  K 3.8 3.7 3.6  CL 103 104 104  CO2 24 24 23   GLUCOSE 139* 142* 140*  BUN 19 21 20   CREATININE 1.27* 1.17 1.15  CALCIUM 8.4* 8.4* 8.4*  PROT 5.9*  --   --   ALBUMIN 3.2*  --   --   AST 29  --   --   ALT 16  --   --   ALKPHOS 63  --   --   BILITOT 1.2  --   --   GFRNONAA 49* 54* 55*  GFRAA 57* >60 >60  ANIONGAP 11 7 9      Hematology Recent Labs  Lab 11/17/19 0325 11/18/19 0250  11/19/19 0248  WBC 10.1 6.9 6.4  RBC 3.71* 3.56* 3.43*  HGB 13.0 12.4* 11.7*  HCT 37.4* 36.0* 34.8*  MCV 100.8* 101.1* 101.5*  MCH 35.0* 34.8* 34.1*  MCHC 34.8 34.4 33.6  RDW 13.4 13.2 13.2  PLT 185 190 192    Cardiac EnzymesNo results for input(s): TROPONINI in the last 168 hours. No results for input(s): TROPIPOC in the last 168 hours.   BNP Recent Labs  Lab 11/15/19 1947  BNP 606.3*     DDimer No results for input(s): DDIMER in the last 168 hours.   Lipid Panel     Component Value Date/Time   CHOL 166 11/16/2019 0026   CHOL 179 05/05/2018 1001   TRIG 88 11/16/2019 0026   TRIG 134 05/05/2018 1001   HDL 47 11/16/2019 0026   HDL 54 05/05/2018 1001   CHOLHDL 3.5 11/16/2019 0026   VLDL 18 11/16/2019 0026   LDLCALC 101 (H) 11/16/2019 0026     Radiology    VAS [place] LOWER EXTREMITY SAPHENOUS VEIN MAPPING  Result Date: 11/17/2019 LOWER EXTREMITY VEIN MAPPING Indications:  Pre-op Risk Factors: Coronary artery disease.  Comparison Study: No prior study Performing Technologist: Maudry Mayhew MHA, RDMS, RVT, RDCS  Examination Guidelines: A complete evaluation includes B-mode imaging, spectral Doppler, color Doppler, and power Doppler as needed of all accessible portions of each vessel. Bilateral testing is considered an integral part of a complete examination. Limited examinations for reoccurring indications may be performed as noted. +---------------+-----------+----------------------+---------------+-----------+   RT Diameter  RT Findings         GSV            LT Diameter  LT Findings      (cm)                                            (cm)                  +---------------+-----------+----------------------+---------------+-----------+      0.29                     Saphenofemoral         0.35                                                   Junction                                   +---------------+-----------+----------------------+---------------+-----------+  0.27                     Proximal thigh         0.20                  +---------------+-----------+----------------------+---------------+-----------+      0.17                       Mid thigh            0.14                  +---------------+-----------+----------------------+---------------+-----------+      0.19                      Distal thigh          0.14                  +---------------+-----------+----------------------+---------------+-----------+      0.15                          Knee              0.16                  +---------------+-----------+----------------------+---------------+-----------+      0.19       branching       Prox calf            0.18       branching  +---------------+-----------+----------------------+---------------+-----------+                                  Mid calf            0.16                  +---------------+-----------+----------------------+---------------+-----------+                                Distal calf           0.17                  +---------------+-----------+----------------------+---------------+-----------+ +----------------+--------------+--------------+----------------+--------------+ RT diameter (cm) RT Findings       SSV      LT Diameter (cm) LT Findings   +----------------+--------------+--------------+----------------+--------------+                 not visualized  Popliteal                   not visualized                                   fossa                                    +----------------+--------------+--------------+----------------+--------------+                 not visualizedProximal calf                 not visualized +----------------+--------------+--------------+----------------+--------------+                 not visualized   Mid calf  not visualized  +----------------+--------------+--------------+----------------+--------------+                 not visualized Distal calf                  not visualized +----------------+--------------+--------------+----------------+--------------+ Diagnosing physician: Harold Barban MD Electronically signed by Harold Barban MD on 11/17/2019 at 11:21:01 PM.    Final     Cardiac Studies   History obtained from chart review. William Walker a 86 y.o.malewith with a hx ofsevere calcific aortic stenosis, multivessel CAD with moderate diffuse disease and patent LAD stent per last [facility] 2019, CVD, hypertension, HLD and DM2who is being seen today in the ER yesterday for chest pain which began on Sunday.  He has chronic nitrate responsive angina.  His troponins rose to 4000.  His EKG showed subtle J-point elevation.  His last 2D echo performed 3 months ago showed severe aortic stenosis.  He is referred for right left heart cath to define his anatomy and physiology.   IMPRESSION: Mr. Pavey has had progression of his CAD.  His mid LAD stent is patent.  He has left main/three-vessel disease.  I believe he has at least 75% left main with a 99% ostial LAD 95% proximal to mid ramus branch stenosis as well as progression of disease in his dominant RCA.  He has severe aortic stenosis.  I do not think he is a percutaneous revascularization candidate.  Mynx closure devices were used to hemostatically sealed the right common femoral artery and venous puncture sites.  The patient left lab in stable condition.  Given his left main, severe CAD and the fact that these had chest pain with non-STEMI I am upgrading him to [facility] for closer clinical observation.  Right Heart Pressures Hemodynamic findings consistent with aortic valve stenosis. Right atrial pressure-15/13, mean 12 Right ventricular pressure-40/9 Pulmonary artery pressure-37/19, mean 26 Pulmonary wedge pressure-A-wave 21, V wave 20, mean 19 Cardiac output by Fick was 5.6  L/min with an index of 2.6 L/min/m Cardiac output by thermodilution was 4.2 L/min with an index of 2 L/min/m LVEDP-29 Aortic peak to peak gradient was 33, mean gradient was 30 Aortic valve area by Fick was 0.98 cm and by thermodilution 0.75 cm       ECHO 11/16/2019 IMPRESSIONS  1. Left ventricular ejection fraction, by estimation, is 40 to 45%. The  left ventricle has normal function. The left ventricle demonstrates  regional wall motion abnormalities (see scoring diagram/findings for  description). Left ventricular diastolic  parameters are consistent with Grade I diastolic dysfunction (impaired  relaxation). There is moderate hypokinesis of the left ventricular, entire  apical segment.  2. Right ventricular systolic function is normal. The right ventricular  size is normal. There is normal pulmonary artery systolic pressure. The  estimated right ventricular systolic pressure is 54.0 mmHg.  3. The mitral valve is normal in structure. Mild mitral valve  regurgitation.  4. Tricuspid valve regurgitation is mild to moderate.  5. The aortic valve is tricuspid. Aortic valve regurgitation is mild.  Severe aortic valve stenosis.   Comparison(s): Prior images reviewed side by side. Changes from prior  study are noted. The left ventricular function is worsened. The left  ventricular wall motion abnormality is is new. Aortic stenosis remains  severe, but gradients are slightly lower,  likely due to reduced left ventricular systolic function.   Patient Profile     William Walker is a 86 y.o. male with with a hx of severe calcific aortic  stenosis, multivessel CAD with moderate diffuse disease and patent LAD stent per last [place] 2019, CVD, hypertension, HLD and DM2 who was admitted on November 15, 2019 with progressive episodes of daily chest pain and concern for unstable angina and aortic stenosis progression.  Assessment & Plan    1.  Severe multivessel CAD: Patient underwent cardiac  catheterization  which reveals progression of multivessel CAD involving the left main, 99% stenosis at the ostium of the LAD with progressive disease in the ramus, left circumflex, and RCA.  Troponin was greater than 4000 from the initial at 68 and apparently the case was reviewed with Dr. Burt Knack and the patient received 300 mg of Plavix last evening and 75 mg on 6/15.   P2 Y12 test 156 on 6/16.  He had recurrent chest pain yesterday leading to increase in nitrates. He had one brief episode of chest pain today which was less intense and occurred when he was trying to get back into bed. P2Y12 194 later 6/17.   2.  Progressive severe aortic stenosis: Cath data reviewed.  Surgical consultation for CABG and surgical aortic valve replacement by Dr. Jobie Quaker appreciated.  I have recommended that the patient undergo surgery particularly with his severe coronary obstructive disease and progressive aortic stenosis with now development of mild LV dysfunction new since his prior echo.  LV dysfunction is most likely combination of his severe coronary disease with progressive AS.    3.  Elevated LVEDP at 29 mm. Clear on exam today.  No JVD  4.  Essential  Hypertension: Patient is now on amlodipine at 2.5 mg which was reduced when his isosorbide was increased to 30 mg daily. Blood pressure has been ranging in the 1 10-1 20 range on therapy although when getting up to get into the chair it did increase to 167/81 briefly.  5.  Hyperlipidemia: We will change statin from pravastatin to rosuvastatin 20 mg daily for more aggressive lipid lowering.  6. Type  2 diabetes mellitus: Hemoglobin A1c 7.4.   7.  Carotid artery disease with last ultrasound February 03, 2019 showing 40 to 59% right internal carotid stenosis and left total occlusion.  Vascular study done yesterday continues to show occluded left carotid but demonstrates progression of right ICA stenoses now in range of 60 - 79%.  Recommend keeping patient in ICU on  IV heparin therapy with his mild recurrent anginal symptoms. For CABG/AVR with Dr. Servando Snare probably Monday, sooner if increasing chest pain develops. We will recheck P2Y12 platelet responsiveness on 11/21/2019  Signed, Troy Sine, MD, [facility] 11/19/2019, 9:13 AM

## 2019-11-19 NOTE — TOC Transition Note (Signed)
Transition of Care [facility]) - CM/SW Discharge Note   Patient Details  Name: William Walker MRN: [id number] Date of Birth: 1927/03/11  [facility] CM/SW Contact:  Curlene Labrum, RN Phone Number: 11/19/2019, 11:06 AM   Clinical Narrative:     Case Management met with the primary nurse this morning on TOC rounds and reported that the patient had 3 caps fall out of his mouth while eating this morning.   The nurse is checking with the attending physician, Dr. Claiborne Billings, concerning the matter.  Information was given to Dr. Ritta Slot office to reach out to see if the patient' s needs assessment prior to his CABG on Monday.        Patient Goals and CMS Choice        Discharge Placement                       Discharge Plan and Services                                     Social Determinants of Health (SDOH) Interventions     Readmission Risk Interventions No flowsheet data found.

## 2019-11-20 LAB — BASIC METABOLIC PANEL
Anion gap: 15 (ref 5–15)
BUN: 21 mg/dL (ref 8–23)
CO2: 18 mmol/L — ABNORMAL LOW (ref 22–32)
Calcium: 8.9 mg/dL (ref 8.9–10.3)
Chloride: 104 mmol/L (ref 98–111)
Creatinine, Ser: 1.27 mg/dL — ABNORMAL HIGH (ref 0.61–1.24)
GFR calc Af Amer: 57 mL/min — ABNORMAL LOW (ref >60)
GFR calc non Af Amer: 49 mL/min — ABNORMAL LOW (ref >60)
Glucose, Bld: 178 mg/dL — ABNORMAL HIGH (ref 70–99)
Potassium: 3.9 mmol/L (ref 3.5–5.1)
Sodium: 137 mmol/L (ref 135–145)

## 2019-11-20 LAB — GLUCOSE, CAPILLARY
Glucose-Capillary: 175 mg/dL — ABNORMAL HIGH (ref 70–99)
Glucose-Capillary: 189 mg/dL — ABNORMAL HIGH (ref 70–99)
Glucose-Capillary: 146 mg/dL — ABNORMAL HIGH (ref 70–99)
Glucose-Capillary: 121 mg/dL — ABNORMAL HIGH (ref 70–99)

## 2019-11-20 LAB — CBC
HCT: 38 % — ABNORMAL LOW (ref 39.0–52.0)
Hemoglobin: 12.5 g/dL — ABNORMAL LOW (ref 13.0–17.0)
MCH: 33.9 pg (ref 26.0–34.0)
MCHC: 32.9 g/dL (ref 30.0–36.0)
MCV: 103 fL — ABNORMAL HIGH (ref 80.0–100.0)
Platelets: 211 10*3/uL (ref 150–400)
RBC: 3.69 MIL/uL — ABNORMAL LOW (ref 4.22–5.81)
RDW: 13.2 % (ref 11.5–15.5)
WBC: 11.6 10*3/uL — ABNORMAL HIGH (ref 4.0–10.5)
nRBC: 0 % (ref 0.0–0.2)

## 2019-11-20 LAB — HEPARIN LEVEL (UNFRACTIONATED): Heparin Unfractionated: 0.51 IU/mL (ref 0.30–0.70)

## 2019-11-20 MED ORDER — PIPERACILLIN-TAZOBACTAM 3.375 G IVPB
3.3750 g | Freq: Three times a day (TID) | INTRAVENOUS | Status: DC
  Administered 2019-11-20 – 2019-11-26 (×18): 3.375 g via INTRAVENOUS
  Filled 2019-11-20 (×18): qty 50

## 2019-11-20 MED ORDER — ALUM & MAG HYDROXIDE-SIMETH 200-200-20 MG/5ML PO SUSP
30.0000 mL | ORAL | Status: DC | PRN
  Administered 2019-11-20: 30 mL via ORAL
  Filled 2019-11-20: qty 30

## 2019-11-20 MED ORDER — METOPROLOL TARTRATE 50 MG PO TABS
50.0000 mg | ORAL_TABLET | Freq: Two times a day (BID) | ORAL | Status: DC
  Administered 2019-11-20 – 2019-11-21 (×3): 50 mg via ORAL
  Filled 2019-11-20 (×3): qty 1

## 2019-11-20 NOTE — Progress Notes (Signed)
Progress Note  Patient Name: William Walker Date of Encounter: 11/20/2019  Primary Cardiologist: Dr. Johnny Bridge  Subjective   He continues to have mild 2/10 retrosternal pressure like pain - controlled by NTG and morphine.   Inpatient Medications    Scheduled Meds: . amLODipine  2.5 mg Oral Daily  . aspirin EC  81 mg Oral Daily  . Chlorhexidine Gluconate Cloth  6 each Topical Daily  . [START ON 11/22/2019] epinephrine  0-10 mcg/min Intravenous To OR  . [START ON 11/22/2019] heparin-papaverine-plasmalyte irrigation   Irrigation To OR  . insulin aspart  0-15 Units Subcutaneous TID WC  . [START ON 11/22/2019] insulin   Intravenous To OR  . isosorbide mononitrate  30 mg Oral Daily  . levothyroxine  50 mcg Oral QAC breakfast  . [START ON 11/22/2019] magnesium sulfate  40 mEq Other To OR  . metoprolol tartrate  50 mg Oral BID  . [START ON 11/22/2019] phenylephrine  30-200 mcg/min Intravenous To OR  . [START ON 11/22/2019] potassium chloride  80 mEq Other To OR  . rosuvastatin  20 mg Oral Daily  . sodium chloride flush  3 mL Intravenous Q12H  . sodium chloride flush  3 mL Intravenous Q12H  . [START ON 11/22/2019] tranexamic acid  15 mg/kg Intravenous To OR  . [START ON 11/22/2019] tranexamic acid  2 mg/kg Intracatheter To OR   Continuous Infusions: . sodium chloride    . [START ON 11/22/2019] dexmedetomidine    . [START ON 11/22/2019] heparin 30,000 units/NS 1000 mL solution for CELLSAVER    . heparin 1,200 Units/hr (11/20/19 0800)  . [START ON 11/22/2019] levofloxacin (LEVAQUIN) IV    . [START ON 11/22/2019] milrinone    . [START ON 11/22/2019] nitroGLYCERIN    . [START ON 11/22/2019] norepinephrine    . piperacillin-tazobactam (ZOSYN)  IV 12.5 mL/hr at 11/20/19 0800  . [START ON 11/22/2019] tranexamic acid (CYKLOKAPRON) infusion ([facility])    . [START ON 11/22/2019] vancomycin     PRN Meds: sodium chloride, acetaminophen, morphine injection, nitroGLYCERIN, ondansetron (ZOFRAN) IV, sodium  chloride flush   Vital Signs    Vitals:   11/20/19 0700 11/20/19 0730 11/20/19 0800 11/20/19 0830  BP: 121/72 115/73 103/71   Pulse: 90 89 90   Resp: (!) 26 19 (!) 21   Temp:    98.4 F (36.9 C)  TempSrc:      SpO2: 97% 95% 94%   Weight:      Height:        Intake/Output Summary (Last 24 hours) at 11/20/2019 0839 Last data filed at 11/20/2019 0800 Gross per 24 hour  Intake 725.06 ml  Output 1125 ml  Net -399.94 ml   I/O since admission: -1651   Filed Weights   11/18/19 0400 11/19/19 0509 11/20/19 0500  Weight: 92.4 kg 93.3 kg 91.7 kg    Telemetry    Sinus 80'-120' - Personally Reviewed  ECG    11/19/2019 ECG (independently read by me): Normal sinus rhythm at 64 bpm, left anterior hemiblock. First-degree block with PR interval 286 ms. T wave inversion lead III, nonspecific ST changes V4 through V6  11/17/2019 ECG (independently read by me): Normal sinus rhythm at 66 bpm minute with first-degree AV block.  Improvement in inferolateral ST changes with mild T wave inversion in lead III presently.  11/15/2019 ECG (independently read by me): Sinus rhythm at 96 with 1st degree AV block, with new ST depression not seen on prior ECG from several months  ago  Physical Exam    BP 103/71   Pulse 90   Temp 98.4 F (36.9 C)   Resp (!) 21   Ht 5\' 11"  (1.803 m)   Wt 91.7 kg   SpO2 94%   BMI 28.20 kg/m  General: Alert, oriented, no distress.  Skin: normal turgor, no rashes, warm and dry HEENT: Normocephalic, atraumatic. Pupils equal round and reactive to light; sclera anicteric; extraocular muscles intact;  Nose without nasal septal hypertrophy Mouth/Parynx benign;  Neck: No JVD, no carotid bruits; normal carotid upstroke Lungs: clear to ausculatation and percussion; no wheezing or rales Chest wall: without tenderness to palpitation Heart: PMI not displaced, RRR, s1, no S2, 5/6 late peaking systolic murmur, no diastolic murmur, no rubs, gallops, thrills, or heaves Abdomen:  soft, nontender; no hepatosplenomehaly, BS+; abdominal aorta nontender and not dilated by palpation. Large right inguinal hernia Back: no CVA tenderness Pulses 2+ Musculoskeletal: full range of motion, normal strength, no joint deformities Extremities: no clubbing cyanosis or edema, Homan's sign negative  Neurologic: grossly nonfocal; Cranial nerves grossly wnl Psychologic: Normal mood and affect   Labs    Chemistry Recent Labs  Lab 11/17/19 0325 11/17/19 0325 11/18/19 0250 11/19/19 0248 11/20/19 0529  NA 138   < > 135 136 137  K 3.8   < > 3.7 3.6 3.9  CL 103   < > 104 104 104  CO2 24   < > 24 23 18*  GLUCOSE 139*   < > 142* 140* 178*  BUN 19   < > 21 20 21   CREATININE 1.27*   < > 1.17 1.15 1.27*  CALCIUM 8.4*   < > 8.4* 8.4* 8.9  PROT 5.9*  --   --   --   --   ALBUMIN 3.2*  --   --   --   --   AST 29  --   --   --   --   ALT 16  --   --   --   --   ALKPHOS 63  --   --   --   --   BILITOT 1.2  --   --   --   --   GFRNONAA 49*   < > 54* 55* 49*  GFRAA 57*   < > >60 >60 57*  ANIONGAP 11   < > 7 9 15    < > = values in this interval not displayed.     Hematology Recent Labs  Lab 11/18/19 0250 11/19/19 0248 11/20/19 0529  WBC 6.9 6.4 11.6*  RBC 3.56* 3.43* 3.69*  HGB 12.4* 11.7* 12.5*  HCT 36.0* 34.8* 38.0*  MCV 101.1* 101.5* 103.0*  MCH 34.8* 34.1* 33.9  MCHC 34.4 33.6 32.9  RDW 13.2 13.2 13.2  PLT 190 192 211    Cardiac EnzymesNo results for input(s): TROPONINI in the last 168 hours. No results for input(s): TROPIPOC in the last 168 hours.   BNP Recent Labs  Lab 11/15/19 1947  BNP 606.3*     DDimer No results for input(s): DDIMER in the last 168 hours.   Lipid Panel     Component Value Date/Time   CHOL 166 11/16/2019 0026   CHOL 179 05/05/2018 1001   TRIG 88 11/16/2019 0026   TRIG 134 05/05/2018 1001   HDL 47 11/16/2019 0026   HDL 54 05/05/2018 1001   CHOLHDL 3.5 11/16/2019 0026   VLDL 18 11/16/2019 0026   LDLCALC 101 (H) 11/16/2019 0026  Radiology    Cardiac Studies   History obtained from chart review. William Walker a 86 y.o.malewith with a hx ofsevere calcific aortic stenosis, multivessel CAD with moderate diffuse disease and patent LAD stent per last LHC 2019, CVD, hypertension, HLD and DM2who is being seen today in the ER yesterday for chest pain which began on Sunday.  He has chronic nitrate responsive angina.  His troponins rose to 4000.  His EKG showed subtle J-point elevation.  His last 2D echo performed 3 months ago showed severe aortic stenosis.  He is referred for right left heart cath to define his anatomy and physiology.   IMPRESSION: William Walker has had progression of his CAD.  His mid LAD stent is patent.  He has left main/three-vessel disease.  I believe he has at least 75% left main with a 99% ostial LAD 95% proximal to mid ramus branch stenosis as well as progression of disease in his dominant RCA.  He has severe aortic stenosis.  I do not think he is a percutaneous revascularization candidate.  Mynx closure devices were used to hemostatically sealed the right common femoral artery and venous puncture sites.  The patient left lab in stable condition.  Given his left main, severe CAD and the fact that these had chest pain with non-STEMI I am upgrading him to [facility] for closer clinical observation.  Right Heart Pressures Hemodynamic findings consistent with aortic valve stenosis. Right atrial pressure-15/13, mean 12 Right ventricular pressure-40/9 Pulmonary artery pressure-37/19, mean 26 Pulmonary wedge pressure-A-wave 21, V wave 20, mean 19 Cardiac output by Fick was 5.6 L/min with an index of 2.6 L/min/m Cardiac output by thermodilution was 4.2 L/min with an index of 2 L/min/m LVEDP-29 Aortic peak to peak gradient was 33, mean gradient was 30 Aortic valve area by Fick was 0.98 cm and by thermodilution 0.75 cm       ECHO 11/16/2019 IMPRESSIONS  1. Left ventricular ejection fraction, by estimation,  is 40 to 45%. The  left ventricle has normal function. The left ventricle demonstrates  regional wall motion abnormalities (see scoring diagram/findings for  description). Left ventricular diastolic  parameters are consistent with Grade I diastolic dysfunction (impaired  relaxation). There is moderate hypokinesis of the left ventricular, entire  apical segment.  2. Right ventricular systolic function is normal. The right ventricular  size is normal. There is normal pulmonary artery systolic pressure. The  estimated right ventricular systolic pressure is 26.9 mmHg.  3. The mitral valve is normal in structure. Mild mitral valve  regurgitation.  4. Tricuspid valve regurgitation is mild to moderate.  5. The aortic valve is tricuspid. Aortic valve regurgitation is mild.  Severe aortic valve stenosis.   Comparison(s): Prior images reviewed side by side. Changes from prior  study are noted. The left ventricular function is worsened. The left  ventricular wall motion abnormality is is new. Aortic stenosis remains  severe, but gradients are slightly lower,  likely due to reduced left ventricular systolic function.   Patient Profile     William Walker is a 86 y.o. male with with a hx of severe calcific aortic stenosis, multivessel CAD with moderate diffuse disease and patent LAD stent per last LHC 2019, CVD, hypertension, HLD and DM2 who was admitted on November 15, 2019 with progressive episodes of daily chest pain and concern for unstable angina and aortic stenosis progression.  Assessment & Plan    1.  Severe multivessel CAD: Patient underwent cardiac catheterization  which reveals progression of  multivessel CAD involving the left main, 99% stenosis at the ostium of the LAD with progressive disease in the ramus, left circumflex, and RCA.  Troponin was greater than 4000 from the initial at 68 and apparently the case was reviewed with Dr. Burt Knack and the patient received 300 mg of Plavix last  evening and 75 mg on 6/15.   P2 Y12 test 156 on 6/16.  He had recurrent chest pain yesterday leading to increase in nitrates. He had one brief episode of chest pain today which was less intense and occurred when he was trying to get back into bed. P2Y12 194 later 6/17.  - he is scheduled for AVR and CABG with DR Servando Snare on Monday 6/21, PYP now 194  2.  Critical aortic stenosis: AVR on Monday.  LV dysfunction is most likely combination of his severe coronary disease with progressive AS.    3.  Elevated LVEDP at 29 mm. Clear on exam today.  No JVD. Negative fluid balance.  4.  Essential  Hypertension: Patient is now on amlodipine at 2.5 mg which was reduced when his isosorbide was increased to 30 mg daily. BP soft, I will increase his metoprolol to 75 mg PO BID as his HR up to 120 BPM. I will discontinue amlodipine. He will need ACEI/ARB post op for LV dysfunction.  5.  Hyperlipidemia: We will change statin from pravastatin to rosuvastatin 20 mg daily for more aggressive lipid lowering.  6. Type  2 diabetes mellitus: Hemoglobin A1c 7.4.   7.  Carotid artery disease with last ultrasound February 03, 2019 showing 40 to 59% right internal carotid stenosis and left total occlusion.  Vascular study done yesterday continues to show occluded left carotid but demonstrates progression of right ICA stenoses now in range of 60 - 79%.  Recommend keeping patient in ICU on IV heparin therapy with his mild recurrent anginal symptoms. For CABG/AVR with Dr. Servando Snare on Monday, sooner if increasing chest pain develops. We will recheck P2Y12 platelet responsiveness on 11/21/2019  Signed, Ena Dawley, MD 11/20/2019, 8:39 AM

## 2019-11-20 NOTE — Progress Notes (Signed)
This RN noticed significant EKG changes on monitor around 0230. Pt went from sinus brady to NSR, and eventually to sinus tach. EKG was done at bedside to confirm changes, see results for detailed info. Shortly after, pt started experiencing 6/10 squeezing chest pain in center of chest. 3 doses of sublingual nitro given with no relief. Morphine given as well--no change in pain. Cardiology MD paged. No new orders.  Pt noted to be febrile at 0400 rounds. With increased HR, concern for sepsis (given dental issues yesterday AM) was relayed to Cardiology MD at bedside. See new orders--plan to give abx and beta blocker. Per MD, hold nitro. Pt A&Ox4, intermittently tachypnic w sustained o2 & BP. Continues to have pitting edema in bilateral lower extremities. Lower lobes are very diminished. Pulses palpable. RN will continue to monitor very closely.

## 2019-11-20 NOTE — Progress Notes (Signed)
William Walker was awake and alert when I arrived. His daughter, who is a Designer, jewellery was bedside. We had a very pleasant visit.  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  We had a very pleasant conversation about family. He said he and his wife have been married 23 or 36 years! He was not tearful today, but seemed accepting. His daughter shared that he had had a difficult night because of chest pain and he didn't get much sleep.  If continued support is needed and appreciated especially tomorrow and before his surgery on Monday, please page.  I offered active listening and spiritual support.  Marlise Eves, MDiv   11/20/19 1900  Clinical Encounter Type  Visited With Patient and family together

## 2019-11-20 NOTE — Progress Notes (Signed)
ANTICOAGULATION CONSULT NOTE  Pharmacy Consult for heparin Indication: chest pain/ACS  No Active Allergies  Vital Signs: Temp: 98.6 F (37 C) (06/19 0600) Temp Source: Oral (06/19 0600) BP: 121/72 (06/19 0700) Pulse Rate: 90 (06/19 0700)  Labs: Recent Labs    11/18/19 0250 11/18/19 0250 11/19/19 0248 11/20/19 0529  HGB 12.4*   < > 11.7* 12.5*  HCT 36.0*  --  34.8* 38.0*  PLT 190  --  192 211  HEPARINUNFRC 0.35  --  0.48 0.51  CREATININE 1.17  --  1.15 1.27*   < > = values in this interval not displayed.    Estimated Creatinine Clearance: 43.9 mL/min (A) (by C-G formula based on SCr of 1.27 mg/dL (H)).   Medications:  Scheduled:  . amLODipine  2.5 mg Oral Daily  . aspirin EC  81 mg Oral Daily  . Chlorhexidine Gluconate Cloth  6 each Topical Daily  . [START ON 11/22/2019] epinephrine  0-10 mcg/min Intravenous To OR  . [START ON 11/22/2019] heparin-papaverine-plasmalyte irrigation   Irrigation To OR  . insulin aspart  0-15 Units Subcutaneous TID WC  . [START ON 11/22/2019] insulin   Intravenous To OR  . isosorbide mononitrate  30 mg Oral Daily  . levothyroxine  50 mcg Oral QAC breakfast  . [START ON 11/22/2019] magnesium sulfate  40 mEq Other To OR  . metoprolol tartrate  50 mg Oral BID  . [START ON 11/22/2019] phenylephrine  30-200 mcg/min Intravenous To OR  . [START ON 11/22/2019] potassium chloride  80 mEq Other To OR  . rosuvastatin  20 mg Oral Daily  . sodium chloride flush  3 mL Intravenous Q12H  . sodium chloride flush  3 mL Intravenous Q12H  . [START ON 11/22/2019] tranexamic acid  15 mg/kg Intravenous To OR  . [START ON 11/22/2019] tranexamic acid  2 mg/kg Intracatheter To OR    Assessment: 68 yom presented with worsening of chronic CP and positive troponins. He is now s/p cath with severe AS and left main disease for tentative surgery on 6/21 after plavix washout. Pharmacy consulted to to dose heparin.  Heparin level continues to be at goal this morning. Hgb  continues to trend down to 12.5, plt wnl. No bleeding issues noted.    Goal of Therapy:  Heparin level 0.3-0.7 units/ml Monitor platelets by anticoagulation protocol: Yes   Plan:  -Continue heparin at 1200 units/hr -Daily heparin level and CBC  Erin Hearing PharmD., BCPS Clinical Pharmacist 11/20/2019 7:55 AM

## 2019-11-20 NOTE — Progress Notes (Signed)
Cardiology Brief Progress Note  Mr. William Walker is admitted for an NSTEMI with severe multivessel CAD including a 99% ostial LAD stenosis as well as severe aortic stenosis. He is planned for CABG and AVR on Monday, 6/21. He apparently had some dental issues yesterday with fracture of some teeth while eating. Tonight he became febrile to 101.4 with associated sinus tachycardia and subsequently developed chest pain and significant ST changes. Blood pressure remains stable. He was given 3 nitro tabs and still having some chest pain. Denies any shortness of breath. Does endorse feeling somewhat feverish earlier tonight.   T 101.4 F, BP 109/71 after 3 nitro, HR 120 Alert and in no distress. Regular rhythm, systolic murmur. JVP is elevated. Trace basilar crackles, normal wob. 1+ LEE, warm and well perfused. Abdomen soft, NT, ND.   Likely experiencing chest pain due to developing sepsis in the setting of severe multivessel CAD (with high-grade ostial LAD) and severe AS. May be bacteremic secondary to his dental issues. Will draw blood cultures and try to manage with antipyretics, beta blocker, and empiric broad spectrum antibiotics. He is at high risk for pulmonary edema and CVP is elevated on my exam, will defer IV fluids. If he fails to improve with treatment of the underlying infectious process and his heart rate, may need IABP or just more urgent surgery later today. Will continue to monitor closely.

## 2019-11-21 DIAGNOSIS — I5031 Acute diastolic (congestive) heart failure: Secondary | ICD-10-CM

## 2019-11-21 LAB — CBC
HCT: 33.8 % — ABNORMAL LOW (ref 39.0–52.0)
Hemoglobin: 11.3 g/dL — ABNORMAL LOW (ref 13.0–17.0)
MCH: 34.7 pg — ABNORMAL HIGH (ref 26.0–34.0)
MCHC: 33.4 g/dL (ref 30.0–36.0)
MCV: 103.7 fL — ABNORMAL HIGH (ref 80.0–100.0)
Platelets: 206 10*3/uL (ref 150–400)
RBC: 3.26 MIL/uL — ABNORMAL LOW (ref 4.22–5.81)
RDW: 13.4 % (ref 11.5–15.5)
WBC: 8.1 10*3/uL (ref 4.0–10.5)
nRBC: 0 % (ref 0.0–0.2)

## 2019-11-21 LAB — POCT I-STAT 7, (LYTES, BLD GAS, ICA,H+H)
Acid-Base Excess: 1 mmol/L (ref 0.0–2.0)
Bicarbonate: 24.5 mmol/L (ref 20.0–28.0)
Calcium, Ion: 1.16 mmol/L (ref 1.15–1.40)
HCT: 35 % — ABNORMAL LOW (ref 39.0–52.0)
Hemoglobin: 11.9 g/dL — ABNORMAL LOW (ref 13.0–17.0)
O2 Saturation: 97 %
Patient temperature: 98.6
Potassium: 3.6 mmol/L (ref 3.5–5.1)
Sodium: 135 mmol/L (ref 135–145)
TCO2: 25 mmol/L (ref 22–32)
pCO2 arterial: 33.5 mmHg (ref 32.0–48.0)
pH, Arterial: 7.472 — ABNORMAL HIGH (ref 7.350–7.450)
pO2, Arterial: 83 mmHg (ref 83.0–108.0)

## 2019-11-21 LAB — URINALYSIS, ROUTINE W REFLEX MICROSCOPIC
Bilirubin Urine: NEGATIVE
Glucose, UA: NEGATIVE mg/dL
Hgb urine dipstick: NEGATIVE
Ketones, ur: 5 mg/dL — AB
Nitrite: NEGATIVE
Protein, ur: NEGATIVE mg/dL
Specific Gravity, Urine: 1.017 (ref 1.005–1.030)
WBC, UA: >50 WBC/hpf — ABNORMAL HIGH (ref 0–5)
pH: 5 (ref 5.0–8.0)

## 2019-11-21 LAB — GLUCOSE, CAPILLARY
Glucose-Capillary: 130 mg/dL — ABNORMAL HIGH (ref 70–99)
Glucose-Capillary: 148 mg/dL — ABNORMAL HIGH (ref 70–99)
Glucose-Capillary: 156 mg/dL — ABNORMAL HIGH (ref 70–99)
Glucose-Capillary: 144 mg/dL — ABNORMAL HIGH (ref 70–99)

## 2019-11-21 LAB — PREPARE RBC (CROSSMATCH)

## 2019-11-21 LAB — ABO/RH: ABO/RH(D): A POS

## 2019-11-21 LAB — PLATELET INHIBITION P2Y12: Platelet Function  P2Y12: 235 [PRU] (ref 182–335)

## 2019-11-21 LAB — SURGICAL PCR SCREEN
MRSA, PCR: NEGATIVE
Staphylococcus aureus: NEGATIVE

## 2019-11-21 LAB — HEPARIN LEVEL (UNFRACTIONATED): Heparin Unfractionated: 0.45 IU/mL (ref 0.30–0.70)

## 2019-11-21 MED ORDER — CHLORHEXIDINE GLUCONATE 0.12 % MT SOLN
15.0000 mL | Freq: Once | OROMUCOSAL | Status: AC
  Administered 2019-11-22: 15 mL via OROMUCOSAL
  Filled 2019-11-21: qty 15

## 2019-11-21 MED ORDER — FUROSEMIDE 10 MG/ML IJ SOLN
40.0000 mg | Freq: Two times a day (BID) | INTRAMUSCULAR | Status: AC
  Administered 2019-11-21 (×2): 40 mg via INTRAVENOUS
  Filled 2019-11-21 (×2): qty 4

## 2019-11-21 MED ORDER — ACETAMINOPHEN 500 MG PO TABS
1000.0000 mg | ORAL_TABLET | Freq: Once | ORAL | Status: AC
  Administered 2019-11-22: 1000 mg via ORAL
  Filled 2019-11-21: qty 2

## 2019-11-21 MED ORDER — METOPROLOL TARTRATE 12.5 MG HALF TABLET: 12.5000 mg | ORAL_TABLET | Freq: Once | ORAL | Status: DC

## 2019-11-21 MED ORDER — METOPROLOL TARTRATE 50 MG PO TABS
75.0000 mg | ORAL_TABLET | Freq: Two times a day (BID) | ORAL | Status: DC
  Administered 2019-11-21: 75 mg via ORAL
  Filled 2019-11-21: qty 1

## 2019-11-21 MED ORDER — CHLORHEXIDINE GLUCONATE CLOTH 2 % EX PADS
6.0000 | MEDICATED_PAD | Freq: Once | CUTANEOUS | Status: AC
  Administered 2019-11-22: 6 via TOPICAL

## 2019-11-21 MED ORDER — BISACODYL 5 MG PO TBEC
5.0000 mg | DELAYED_RELEASE_TABLET | Freq: Once | ORAL | Status: AC
  Administered 2019-11-21: 5 mg via ORAL
  Filled 2019-11-21: qty 1

## 2019-11-21 MED ORDER — TEMAZEPAM 15 MG PO CAPS
15.0000 mg | ORAL_CAPSULE | Freq: Once | ORAL | Status: AC | PRN
  Administered 2019-11-21: 15 mg via ORAL
  Filled 2019-11-21: qty 1

## 2019-11-21 MED ORDER — CHLORHEXIDINE GLUCONATE CLOTH 2 % EX PADS
6.0000 | MEDICATED_PAD | Freq: Once | CUTANEOUS | Status: AC
  Administered 2019-11-21: 6 via TOPICAL

## 2019-11-21 NOTE — Progress Notes (Signed)
Pt's daughter took all pt belongings home with her at this time.

## 2019-11-21 NOTE — Progress Notes (Signed)
ANTICOAGULATION CONSULT NOTE  Pharmacy Consult for heparin Indication: chest pain/ACS  No Active Allergies  Vital Signs: Temp: 97.7 F (36.5 C) (06/20 0635) Temp Source: Oral (06/20 0635) BP: 91/80 (06/20 1200) Pulse Rate: 68 (06/20 1200)  Labs: Recent Labs    11/19/19 0248 11/19/19 0248 11/20/19 0529 11/21/19 0607  HGB 11.7*   < > 12.5* 11.3*  HCT 34.8*  --  38.0* 33.8*  PLT 192  --  211 206  HEPARINUNFRC 0.48  --  0.51 0.45  CREATININE 1.15  --  1.27*  --    < > = values in this interval not displayed.    Estimated Creatinine Clearance: 44.2 mL/min (A) (by C-G formula based on SCr of 1.27 mg/dL (H)).   Medications:  Scheduled:  . aspirin EC  81 mg Oral Daily  . Chlorhexidine Gluconate Cloth  6 each Topical Daily  . [START ON 11/22/2019] epinephrine  0-10 mcg/min Intravenous To OR  . furosemide  40 mg Intravenous BID  . [START ON 11/22/2019] heparin-papaverine-plasmalyte irrigation   Irrigation To OR  . insulin aspart  0-15 Units Subcutaneous TID WC  . [START ON 11/22/2019] insulin   Intravenous To OR  . isosorbide mononitrate  30 mg Oral Daily  . levothyroxine  50 mcg Oral QAC breakfast  . [START ON 11/22/2019] magnesium sulfate  40 mEq Other To OR  . metoprolol tartrate  75 mg Oral BID  . [START ON 11/22/2019] phenylephrine  30-200 mcg/min Intravenous To OR  . [START ON 11/22/2019] potassium chloride  80 mEq Other To OR  . rosuvastatin  20 mg Oral Daily  . sodium chloride flush  3 mL Intravenous Q12H  . sodium chloride flush  3 mL Intravenous Q12H  . [START ON 11/22/2019] tranexamic acid  15 mg/kg Intravenous To OR  . [START ON 11/22/2019] tranexamic acid  2 mg/kg Intracatheter To OR    Assessment: 30 yom presented with worsening of chronic CP and positive troponins. He is now s/p cath with severe AS and left main disease for tentative surgery on 6/21 after plavix washout. Pharmacy consulted to to dose heparin.  Heparin level continues to be at goal this morning.  Hgb continues to trend down to 11.3 plt wnl. No bleeding issues noted.    Goal of Therapy:  Heparin level 0.3-0.7 units/ml Monitor platelets by anticoagulation protocol: Yes   Plan:  -Continue heparin at 1200 units/hr -Daily heparin level and CBC  Erin Hearing PharmD., BCPS Clinical Pharmacist 11/21/2019 1:17 PM

## 2019-11-21 NOTE — Progress Notes (Signed)
Progress Note  Patient Name: William Walker Date of Encounter: 11/21/2019  Primary Cardiologist: Dr. Johnny Bridge  Subjective   He is chest pain free since yesterday and SOB has improved.   Inpatient Medications    Scheduled Meds: . amLODipine  2.5 mg Oral Daily  . aspirin EC  81 mg Oral Daily  . Chlorhexidine Gluconate Cloth  6 each Topical Daily  . [START ON 11/22/2019] epinephrine  0-10 mcg/min Intravenous To OR  . [START ON 11/22/2019] heparin-papaverine-plasmalyte irrigation   Irrigation To OR  . insulin aspart  0-15 Units Subcutaneous TID WC  . [START ON 11/22/2019] insulin   Intravenous To OR  . isosorbide mononitrate  30 mg Oral Daily  . levothyroxine  50 mcg Oral QAC breakfast  . [START ON 11/22/2019] magnesium sulfate  40 mEq Other To OR  . metoprolol tartrate  50 mg Oral BID  . [START ON 11/22/2019] phenylephrine  30-200 mcg/min Intravenous To OR  . [START ON 11/22/2019] potassium chloride  80 mEq Other To OR  . rosuvastatin  20 mg Oral Daily  . sodium chloride flush  3 mL Intravenous Q12H  . sodium chloride flush  3 mL Intravenous Q12H  . [START ON 11/22/2019] tranexamic acid  15 mg/kg Intravenous To OR  . [START ON 11/22/2019] tranexamic acid  2 mg/kg Intracatheter To OR   Continuous Infusions: . sodium chloride    . [START ON 11/22/2019] dexmedetomidine    . [START ON 11/22/2019] heparin 30,000 units/NS 1000 mL solution for CELLSAVER    . heparin 1,200 Units/hr (11/21/19 0900)  . [START ON 11/22/2019] levofloxacin (LEVAQUIN) IV    . [START ON 11/22/2019] milrinone    . [START ON 11/22/2019] nitroGLYCERIN    . [START ON 11/22/2019] norepinephrine    . piperacillin-tazobactam (ZOSYN)  IV 12.5 mL/hr at 11/21/19 0900  . [START ON 11/22/2019] tranexamic acid (CYKLOKAPRON) infusion ([facility])    . [START ON 11/22/2019] vancomycin     PRN Meds: sodium chloride, acetaminophen, alum & mag hydroxide-simeth, morphine injection, nitroGLYCERIN, ondansetron (ZOFRAN) IV, sodium chloride  flush   Vital Signs    Vitals:   11/21/19 0635 11/21/19 0700 11/21/19 0800 11/21/19 0900  BP:  133/79 123/72 126/72  Pulse:  96 79 71  Resp:  20 18 12   Temp: 97.7 F (36.5 C)     TempSrc: Oral     SpO2:  92% 94% 97%  Weight:      Height:        Intake/Output Summary (Last 24 hours) at 11/21/2019 0926 Last data filed at 11/21/2019 0900 Gross per 24 hour  Intake 795.73 ml  Output 1725 ml  Net -929.27 ml   I/O since admission: -1651   Filed Weights   11/19/19 0509 11/20/19 0500 11/21/19 0500  Weight: 93.3 kg 91.7 kg 93.3 kg    Telemetry    Sinus 60', 1 run of ns VT for total of 7 beats - Personally Reviewed  ECG    11/19/2019 ECG (independently read by me): Normal sinus rhythm at 64 bpm, left anterior hemiblock. First-degree block with PR interval 286 ms. T wave inversion lead III, nonspecific ST changes V4 through V6  11/17/2019 ECG (independently read by me): Normal sinus rhythm at 66 bpm minute with first-degree AV block.  Improvement in inferolateral ST changes with mild T wave inversion in lead III presently.  11/15/2019 ECG (independently read by me): Sinus rhythm at 96 with 1st degree AV block, with new ST depression not  seen on prior ECG from several months ago  Physical Exam    BP 126/72   Pulse 71   Temp 97.7 F (36.5 C) (Oral)   Resp 12   Ht 5\' 11"  (1.803 m)   Wt 93.3 kg   SpO2 97%   BMI 28.69 kg/m  General: Alert, oriented, no distress.  Skin: normal turgor, no rashes, warm and dry HEENT: Normocephalic, atraumatic. Pupils equal round and reactive to light; sclera anicteric; extraocular muscles intact;  Nose without nasal septal hypertrophy Mouth/Parynx benign;  Neck: No JVD, no carotid bruits; normal carotid upstroke Lungs: clear to ausculatation and percussion; no wheezing or rales Chest wall: without tenderness to palpitation Heart: PMI not displaced, RRR, s1, no S2, 5/6 late peaking systolic murmur, no diastolic murmur, no rubs, gallops, thrills,  or heaves Abdomen: soft, nontender; no hepatosplenomehaly, BS+; abdominal aorta nontender and not dilated by palpation. Large right inguinal hernia Back: no CVA tenderness Pulses 2+ Musculoskeletal: full range of motion, normal strength, no joint deformities Extremities: no clubbing cyanosis, 2+ edema in his feet B/L, Homan's sign negative  Neurologic: grossly nonfocal; Cranial nerves grossly wnl Psychologic: Normal mood and affect   Labs    Chemistry Recent Labs  Lab 11/17/19 0325 11/17/19 0325 11/18/19 0250 11/19/19 0248 11/20/19 0529  NA 138   < > 135 136 137  K 3.8   < > 3.7 3.6 3.9  CL 103   < > 104 104 104  CO2 24   < > 24 23 18*  GLUCOSE 139*   < > 142* 140* 178*  BUN 19   < > 21 20 21   CREATININE 1.27*   < > 1.17 1.15 1.27*  CALCIUM 8.4*   < > 8.4* 8.4* 8.9  PROT 5.9*  --   --   --   --   ALBUMIN 3.2*  --   --   --   --   AST 29  --   --   --   --   ALT 16  --   --   --   --   ALKPHOS 63  --   --   --   --   BILITOT 1.2  --   --   --   --   GFRNONAA 49*   < > 54* 55* 49*  GFRAA 57*   < > >60 >60 57*  ANIONGAP 11   < > 7 9 15    < > = values in this interval not displayed.     Hematology Recent Labs  Lab 11/19/19 0248 11/20/19 0529 11/21/19 0607  WBC 6.4 11.6* 8.1  RBC 3.43* 3.69* 3.26*  HGB 11.7* 12.5* 11.3*  HCT 34.8* 38.0* 33.8*  MCV 101.5* 103.0* 103.7*  MCH 34.1* 33.9 34.7*  MCHC 33.6 32.9 33.4  RDW 13.2 13.2 13.4  PLT 192 211 206    Cardiac EnzymesNo results for input(s): TROPONINI in the last 168 hours. No results for input(s): TROPIPOC in the last 168 hours.   BNP Recent Labs  Lab 11/15/19 1947  BNP 606.3*     DDimer No results for input(s): DDIMER in the last 168 hours.   Lipid Panel     Component Value Date/Time   CHOL 166 11/16/2019 0026   CHOL 179 05/05/2018 1001   TRIG 88 11/16/2019 0026   TRIG 134 05/05/2018 1001   HDL 47 11/16/2019 0026   HDL 54 05/05/2018 1001   CHOLHDL 3.5 11/16/2019 0026   VLDL 18  11/16/2019 0026    LDLCALC 101 (H) 11/16/2019 0026     Radiology    Cardiac Studies   History obtained from chart review. William Walker a 86 y.o.malewith with a hx ofsevere calcific aortic stenosis, multivessel CAD with moderate diffuse disease and patent LAD stent per last LHC 2019, CVD, hypertension, HLD and DM2who is being seen today in the ER yesterday for chest pain which began on Sunday.  He has chronic nitrate responsive angina.  His troponins rose to 4000.  His EKG showed subtle J-point elevation.  His last 2D echo performed 3 months ago showed severe aortic stenosis.  He is referred for right left heart cath to define his anatomy and physiology.   IMPRESSION: William Walker has had progression of his CAD.  His mid LAD stent is patent.  He has left main/three-vessel disease.  I believe he has at least 75% left main with a 99% ostial LAD 95% proximal to mid ramus branch stenosis as well as progression of disease in his dominant RCA.  He has severe aortic stenosis.  I do not think he is a percutaneous revascularization candidate.  Mynx closure devices were used to hemostatically sealed the right common femoral artery and venous puncture sites.  The patient left lab in stable condition.  Given his left main, severe CAD and the fact that these had chest pain with non-STEMI I am upgrading him to [facility] for closer clinical observation.  Right Heart Pressures Hemodynamic findings consistent with aortic valve stenosis. Right atrial pressure-15/13, mean 12 Right ventricular pressure-40/9 Pulmonary artery pressure-37/19, mean 26 Pulmonary wedge pressure-A-wave 21, V wave 20, mean 19 Cardiac output by Fick was 5.6 L/min with an index of 2.6 L/min/m Cardiac output by thermodilution was 4.2 L/min with an index of 2 L/min/m LVEDP-29 Aortic peak to peak gradient was 33, mean gradient was 30 Aortic valve area by Fick was 0.98 cm and by thermodilution 0.75 cm       ECHO 11/16/2019 IMPRESSIONS  1. Left  ventricular ejection fraction, by estimation, is 40 to 45%. The  left ventricle has normal function. The left ventricle demonstrates  regional wall motion abnormalities (see scoring diagram/findings for  description). Left ventricular diastolic  parameters are consistent with Grade I diastolic dysfunction (impaired  relaxation). There is moderate hypokinesis of the left ventricular, entire  apical segment.  2. Right ventricular systolic function is normal. The right ventricular  size is normal. There is normal pulmonary artery systolic pressure. The  estimated right ventricular systolic pressure is 26.2 mmHg.  3. The mitral valve is normal in structure. Mild mitral valve  regurgitation.  4. Tricuspid valve regurgitation is mild to moderate.  5. The aortic valve is tricuspid. Aortic valve regurgitation is mild.  Severe aortic valve stenosis.   Comparison(s): Prior images reviewed side by side. Changes from prior  study are noted. The left ventricular function is worsened. The left  ventricular wall motion abnormality is is new. Aortic stenosis remains  severe, but gradients are slightly lower,  likely due to reduced left ventricular systolic function.   Patient Profile     William Walker is a 86 y.o. male with with a hx of severe calcific aortic stenosis, multivessel CAD with moderate diffuse disease and patent LAD stent per last LHC 2019, CVD, hypertension, HLD and DM2 who was admitted on November 15, 2019 with progressive episodes of daily chest pain and concern for unstable angina and aortic stenosis progression.  Assessment & Plan  1.  Severe multivessel CAD: Patient underwent cardiac catheterization  which reveals progression of multivessel CAD involving the left main, 99% stenosis at the ostium of the LAD with progressive disease in the ramus, left circumflex, and RCA.  Troponin was greater than 4000 from the initial at 68 and apparently the case was reviewed with Dr. Burt Knack and the  patient received 300 mg of Plavix last evening and 75 mg on 6/15.   P2 Y12 test 156 on 6/16.   PYP today 235. - he is scheduled for AVR and CABG with DR Servando Snare on Monday 6/21.  2.  Critical aortic stenosis: AVR on Monday.  LV dysfunction is most likely combination of his severe coronary disease with progressive AS.    3.  Elevated LVEDP at 29 mm. Residual LE edema, I will give 2 doses of lasix 40 mg IV, also apply compression socks.  Negative fluid balance.  4.  Essential  Hypertension: Patient is now on amlodipine at 2.5 mg which was reduced when his isosorbide was increased to 30 mg daily. BP soft, I will increase his metoprolol to 75 mg PO BID as his HR up to 120 BPM.  He will need ACEI/ARB post op for LV dysfunction.  5.  Hyperlipidemia: We will change statin from pravastatin to rosuvastatin 20 mg daily for more aggressive lipid lowering.  6. Type  2 diabetes mellitus: Hemoglobin A1c 7.4.   7.  Carotid artery disease with last ultrasound February 03, 2019 showing 40 to 59% right internal carotid stenosis and left total occlusion.  Vascular study done yesterday continues to show occluded left carotid but demonstrates progression of right ICA stenoses now in range of 60 - 79%.  Recommend keeping patient in ICU on IV heparin therapy with his mild recurrent anginal symptoms. For CABG/AVR with Dr. Servando Snare on Monday, sooner if increasing chest pain develops. We will recheck P2Y12 platelet responsiveness on 11/21/2019  Signed, Ena Dawley, MD 11/21/2019, 9:26 AM

## 2019-11-21 NOTE — Anesthesia Preprocedure Evaluation (Addendum)
Anesthesia Evaluation  Patient identified by MRN, date of birth, ID band Patient awake    Reviewed: Allergy & Precautions, H&P , NPO status , Patient's Chart, lab work & pertinent test results  Airway Mallampati: III  TM Distance: >3 FB Neck ROM: Full    Dental no notable dental hx. (+) Poor Dentition, Dental Advisory Given   Pulmonary neg pulmonary ROS, former smoker,    Pulmonary exam normal breath sounds clear to auscultation       Cardiovascular Exercise Tolerance: Good hypertension, Pt. on medications and Pt. on home beta blockers + CAD, + Past MI and + Cardiac Stents  + Valvular Problems/Murmurs AS  Rhythm:Regular Rate:Normal + Systolic murmurs    Neuro/Psych negative neurological ROS  negative psych ROS   GI/Hepatic Neg liver ROS, GERD  Medicated,  Endo/Other  diabetes, Type 2, Oral Hypoglycemic Agents  Renal/GU negative Renal ROS  negative genitourinary   Musculoskeletal   Abdominal   Peds  Hematology negative hematology ROS (+)   Anesthesia Other Findings   Reproductive/Obstetrics negative OB ROS                            Anesthesia Physical Anesthesia Plan  ASA: IV  Anesthesia Plan: General   Post-op Pain Management:    Induction: Intravenous  PONV Risk Score and Plan: 2 and Midazolam and Treatment may vary due to age or medical condition  Airway Management Planned: Oral ETT  Additional Equipment: Arterial line, CVP, PA Cath, TEE and Ultrasound Guidance Line Placement  Intra-op Plan:   Post-operative Plan: Post-operative intubation/ventilation  Informed Consent: I have reviewed the patients History and Physical, chart, labs and discussed the procedure including the risks, benefits and alternatives for the proposed anesthesia with the patient or authorized representative who has indicated his/her understanding and acceptance.     Dental advisory given  Plan  Discussed with: CRNA  Anesthesia Plan Comments:        Anesthesia Quick Evaluation

## 2019-11-22 ENCOUNTER — Inpatient Hospital Stay (HOSPITAL_COMMUNITY): Payer: Medicare Other | Admitting: Certified Registered Nurse Anesthetist

## 2019-11-22 ENCOUNTER — Inpatient Hospital Stay (HOSPITAL_COMMUNITY): Payer: Medicare Other

## 2019-11-22 ENCOUNTER — Ambulatory Visit (HOSPITAL_COMMUNITY): Admission: RE | Admit: 2019-11-22 | Payer: Medicare Other | Source: Ambulatory Visit

## 2019-11-22 ENCOUNTER — Inpatient Hospital Stay (HOSPITAL_COMMUNITY)
Admission: RE | Disposition: A | Discharge status: Rehabilitation Facility | Payer: Self-pay | Source: Home / Self Care | Attending: Cardiothoracic Surgery

## 2019-11-22 DIAGNOSIS — I2510 Atherosclerotic heart disease of native coronary artery without angina pectoris: Secondary | ICD-10-CM

## 2019-11-22 DIAGNOSIS — I350 Nonrheumatic aortic (valve) stenosis: Secondary | ICD-10-CM

## 2019-11-22 DIAGNOSIS — Z953 Presence of xenogenic heart valve: Secondary | ICD-10-CM

## 2019-11-22 HISTORY — PX: CORONARY ARTERY BYPASS GRAFT: SHX141

## 2019-11-22 HISTORY — PX: AORTIC VALVE REPLACEMENT: SHX41

## 2019-11-22 HISTORY — PX: TEE WITHOUT CARDIOVERSION: SHX5443

## 2019-11-22 LAB — GLUCOSE, CAPILLARY
Glucose-Capillary: 129 mg/dL — ABNORMAL HIGH (ref 70–99)
Glucose-Capillary: 107 mg/dL — ABNORMAL HIGH (ref 70–99)
Glucose-Capillary: 110 mg/dL — ABNORMAL HIGH (ref 70–99)
Glucose-Capillary: 114 mg/dL — ABNORMAL HIGH (ref 70–99)
Glucose-Capillary: 109 mg/dL — ABNORMAL HIGH (ref 70–99)
Glucose-Capillary: 104 mg/dL — ABNORMAL HIGH (ref 70–99)
Glucose-Capillary: 127 mg/dL — ABNORMAL HIGH (ref 70–99)
Glucose-Capillary: 130 mg/dL — ABNORMAL HIGH (ref 70–99)
Glucose-Capillary: 134 mg/dL — ABNORMAL HIGH (ref 70–99)

## 2019-11-22 LAB — POCT I-STAT 7, (LYTES, BLD GAS, ICA,H+H)
Acid-base deficit: 3 mmol/L — ABNORMAL HIGH (ref 0.0–2.0)
Acid-base deficit: 4 mmol/L — ABNORMAL HIGH (ref 0.0–2.0)
Bicarbonate: 21.7 mmol/L (ref 20.0–28.0)
Bicarbonate: 20.9 mmol/L (ref 20.0–28.0)
Calcium, Ion: 1.07 mmol/L — ABNORMAL LOW (ref 1.15–1.40)
Calcium, Ion: 1.05 mmol/L — ABNORMAL LOW (ref 1.15–1.40)
HCT: 24 % — ABNORMAL LOW (ref 39.0–52.0)
HCT: 22 % — ABNORMAL LOW (ref 39.0–52.0)
Hemoglobin: 8.2 g/dL — ABNORMAL LOW (ref 13.0–17.0)
Hemoglobin: 7.5 g/dL — ABNORMAL LOW (ref 13.0–17.0)
O2 Saturation: 97 %
O2 Saturation: 98 %
Patient temperature: 35.6
Patient temperature: 37.3
Potassium: 3.8 mmol/L (ref 3.5–5.1)
Potassium: 4.7 mmol/L (ref 3.5–5.1)
Sodium: 139 mmol/L (ref 135–145)
Sodium: 138 mmol/L (ref 135–145)
TCO2: 23 mmol/L (ref 22–32)
TCO2: 22 mmol/L (ref 22–32)
pCO2 arterial: 35 mmHg (ref 32.0–48.0)
pCO2 arterial: 35.2 mmHg (ref 32.0–48.0)
pH, Arterial: 7.395 (ref 7.350–7.450)
pH, Arterial: 7.383 (ref 7.350–7.450)
pO2, Arterial: 83 mmHg (ref 83.0–108.0)
pO2, Arterial: 114 mmHg — ABNORMAL HIGH (ref 83.0–108.0)

## 2019-11-22 LAB — COMPREHENSIVE METABOLIC PANEL
ALT: 51 U/L — ABNORMAL HIGH (ref 0–44)
AST: 84 U/L — ABNORMAL HIGH (ref 15–41)
Albumin: 3.3 g/dL — ABNORMAL LOW (ref 3.5–5.0)
Alkaline Phosphatase: 57 U/L (ref 38–126)
Anion gap: 12 (ref 5–15)
BUN: 25 mg/dL — ABNORMAL HIGH (ref 8–23)
CO2: 24 mmol/L (ref 22–32)
Calcium: 8.6 mg/dL — ABNORMAL LOW (ref 8.9–10.3)
Chloride: 100 mmol/L (ref 98–111)
Creatinine, Ser: 1.69 mg/dL — ABNORMAL HIGH (ref 0.61–1.24)
GFR calc Af Amer: 40 mL/min — ABNORMAL LOW (ref >60)
GFR calc non Af Amer: 35 mL/min — ABNORMAL LOW (ref >60)
Glucose, Bld: 135 mg/dL — ABNORMAL HIGH (ref 70–99)
Potassium: 3.6 mmol/L (ref 3.5–5.1)
Sodium: 136 mmol/L (ref 135–145)
Total Bilirubin: 0.8 mg/dL (ref 0.3–1.2)
Total Protein: 6.2 g/dL — ABNORMAL LOW (ref 6.5–8.1)

## 2019-11-22 LAB — CBC
HCT: 33.4 % — ABNORMAL LOW (ref 39.0–52.0)
HCT: 25 % — ABNORMAL LOW (ref 39.0–52.0)
HCT: 24 % — ABNORMAL LOW (ref 39.0–52.0)
Hemoglobin: 11.1 g/dL — ABNORMAL LOW (ref 13.0–17.0)
Hemoglobin: 8.5 g/dL — ABNORMAL LOW (ref 13.0–17.0)
Hemoglobin: 8 g/dL — ABNORMAL LOW (ref 13.0–17.0)
MCH: 33.8 pg (ref 26.0–34.0)
MCH: 34.8 pg — ABNORMAL HIGH (ref 26.0–34.0)
MCH: 34.5 pg — ABNORMAL HIGH (ref 26.0–34.0)
MCHC: 33.2 g/dL (ref 30.0–36.0)
MCHC: 34 g/dL (ref 30.0–36.0)
MCHC: 33.3 g/dL (ref 30.0–36.0)
MCV: 101.8 fL — ABNORMAL HIGH (ref 80.0–100.0)
MCV: 102.5 fL — ABNORMAL HIGH (ref 80.0–100.0)
MCV: 103.4 fL — ABNORMAL HIGH (ref 80.0–100.0)
Platelets: 211 10*3/uL (ref 150–400)
Platelets: 133 10*3/uL — ABNORMAL LOW (ref 150–400)
Platelets: 138 10*3/uL — ABNORMAL LOW (ref 150–400)
RBC: 3.28 MIL/uL — ABNORMAL LOW (ref 4.22–5.81)
RBC: 2.44 MIL/uL — ABNORMAL LOW (ref 4.22–5.81)
RBC: 2.32 MIL/uL — ABNORMAL LOW (ref 4.22–5.81)
RDW: 13.3 % (ref 11.5–15.5)
RDW: 13.2 % (ref 11.5–15.5)
RDW: 13.4 % (ref 11.5–15.5)
WBC: 7.6 10*3/uL (ref 4.0–10.5)
WBC: 13.4 10*3/uL — ABNORMAL HIGH (ref 4.0–10.5)
WBC: 9.6 10*3/uL (ref 4.0–10.5)
nRBC: 0 % (ref 0.0–0.2)
nRBC: 0 % (ref 0.0–0.2)
nRBC: 0 % (ref 0.0–0.2)

## 2019-11-22 LAB — PROTIME-INR
INR: 1.2 (ref 0.8–1.2)
INR: 1.7 — ABNORMAL HIGH (ref 0.8–1.2)
Prothrombin Time: 14.4 seconds (ref 11.4–15.2)
Prothrombin Time: 19.5 seconds — ABNORMAL HIGH (ref 11.4–15.2)

## 2019-11-22 LAB — HEMOGLOBIN AND HEMATOCRIT, BLOOD
HCT: 25.9 % — ABNORMAL LOW (ref 39.0–52.0)
Hemoglobin: 8.7 g/dL — ABNORMAL LOW (ref 13.0–17.0)

## 2019-11-22 LAB — BASIC METABOLIC PANEL
Anion gap: 10 (ref 5–15)
BUN: 18 mg/dL (ref 8–23)
CO2: 19 mmol/L — ABNORMAL LOW (ref 22–32)
Calcium: 7.3 mg/dL — ABNORMAL LOW (ref 8.9–10.3)
Chloride: 108 mmol/L (ref 98–111)
Creatinine, Ser: 1.37 mg/dL — ABNORMAL HIGH (ref 0.61–1.24)
GFR calc Af Amer: 52 mL/min — ABNORMAL LOW (ref >60)
GFR calc non Af Amer: 45 mL/min — ABNORMAL LOW (ref >60)
Glucose, Bld: 138 mg/dL — ABNORMAL HIGH (ref 70–99)
Potassium: 4.7 mmol/L (ref 3.5–5.1)
Sodium: 137 mmol/L (ref 135–145)

## 2019-11-22 LAB — PREPARE RBC (CROSSMATCH)

## 2019-11-22 LAB — APTT
aPTT: 104 seconds — ABNORMAL HIGH (ref 24–36)
aPTT: 57 seconds — ABNORMAL HIGH (ref 24–36)

## 2019-11-22 LAB — MAGNESIUM: Magnesium: 3 mg/dL — ABNORMAL HIGH (ref 1.7–2.4)

## 2019-11-22 LAB — PLATELET COUNT: Platelets: 177 10*3/uL (ref 150–400)

## 2019-11-22 LAB — ECHO INTRAOPERATIVE TEE
Height: 71 in
Weight: 3213.42 oz

## 2019-11-22 SURGERY — CORONARY ARTERY BYPASS GRAFTING (CABG)
Anesthesia: General | Site: Chest

## 2019-11-22 MED ORDER — FAMOTIDINE IN NACL 20-0.9 MG/50ML-% IV SOLN
INTRAVENOUS | Status: AC
  Filled 2019-11-22: qty 50

## 2019-11-22 MED ORDER — LIDOCAINE 2% (20 MG/ML) 5 ML SYRINGE
INTRAMUSCULAR | Status: AC
  Filled 2019-11-22: qty 5

## 2019-11-22 MED ORDER — EPHEDRINE 5 MG/ML INJ
INTRAVENOUS | Status: AC
  Filled 2019-11-22: qty 10

## 2019-11-22 MED ORDER — PANTOPRAZOLE SODIUM 40 MG PO TBEC
40.0000 mg | DELAYED_RELEASE_TABLET | Freq: Every day | ORAL | Status: DC
  Administered 2019-11-24 – 2019-12-07 (×14): 40 mg via ORAL
  Filled 2019-11-22 (×14): qty 1

## 2019-11-22 MED ORDER — CHLORHEXIDINE GLUCONATE CLOTH 2 % EX PADS
6.0000 | MEDICATED_PAD | Freq: Every day | CUTANEOUS | Status: DC
  Administered 2019-11-22 – 2019-12-07 (×15): 6 via TOPICAL

## 2019-11-22 MED ORDER — PROPOFOL 10 MG/ML IV BOLUS
INTRAVENOUS | Status: DC | PRN
  Administered 2019-11-22: 40 mg via INTRAVENOUS

## 2019-11-22 MED ORDER — HEMOSTATIC AGENTS (NO CHARGE) OPTIME
TOPICAL | Status: DC | PRN
  Administered 2019-11-22 (×2): 1 via TOPICAL

## 2019-11-22 MED ORDER — SODIUM CHLORIDE 0.9 % IV SOLN: 250.0000 mL | INTRAVENOUS | Status: DC

## 2019-11-22 MED ORDER — DEXTROSE 50 % IV SOLN: 0.0000 mL | INTRAVENOUS | Status: DC | PRN

## 2019-11-22 MED ORDER — LACTATED RINGERS IV SOLN: INTRAVENOUS | Status: DC | PRN

## 2019-11-22 MED ORDER — DOPAMINE-DEXTROSE 1.6-5 MG/ML-% IV SOLN
INTRAVENOUS | Status: DC | PRN
Start: 2019-11-22 — End: 2019-11-22
  Administered 2019-11-22: 3 ug/kg/min via INTRAVENOUS

## 2019-11-22 MED ORDER — ALBUMIN HUMAN 5 % IV SOLN
INTRAVENOUS | Status: DC | PRN
  Administered 2019-11-22: 12.5 g via INTRAVENOUS

## 2019-11-22 MED ORDER — LACTATED RINGERS IV SOLN
INTRAVENOUS | Status: DC | PRN
Start: 2019-11-22 — End: 2019-11-22

## 2019-11-22 MED ORDER — PLASMA-LYTE 148 IV SOLN
INTRAVENOUS | Status: DC | PRN
  Administered 2019-11-22: 500 mL via INTRAVASCULAR

## 2019-11-22 MED ORDER — MAGNESIUM SULFATE 4 GM/100ML IV SOLN
INTRAVENOUS | Status: AC
  Administered 2019-11-22: 4 g via INTRAVENOUS
  Filled 2019-11-22: qty 100

## 2019-11-22 MED ORDER — HEPARIN SODIUM (PORCINE) 1000 UNIT/ML IJ SOLN
INTRAMUSCULAR | Status: DC | PRN
  Administered 2019-11-22: 32000 [IU] via INTRAVENOUS

## 2019-11-22 MED ORDER — SODIUM CHLORIDE 0.45 % IV SOLN: INTRAVENOUS | Status: DC | PRN

## 2019-11-22 MED ORDER — BISACODYL 10 MG RE SUPP: 10.0000 mg | Freq: Every day | RECTAL | Status: DC

## 2019-11-22 MED ORDER — ETOMIDATE 2 MG/ML IV SOLN
INTRAVENOUS | Status: AC
  Filled 2019-11-22: qty 10

## 2019-11-22 MED ORDER — MIDAZOLAM HCL (PF) 10 MG/2ML IJ SOLN
INTRAMUSCULAR | Status: AC
  Filled 2019-11-22: qty 2

## 2019-11-22 MED ORDER — HEPARIN SODIUM (PORCINE) 1000 UNIT/ML IJ SOLN
INTRAMUSCULAR | Status: AC
  Filled 2019-11-22: qty 1

## 2019-11-22 MED ORDER — SUCCINYLCHOLINE CHLORIDE 200 MG/10ML IV SOSY
PREFILLED_SYRINGE | INTRAVENOUS | Status: AC
  Filled 2019-11-22: qty 10

## 2019-11-22 MED ORDER — SODIUM CHLORIDE 0.9% FLUSH: 3.0000 mL | INTRAVENOUS | Status: DC | PRN

## 2019-11-22 MED ORDER — PHENYLEPHRINE HCL-NACL 10-0.9 MG/250ML-% IV SOLN
INTRAVENOUS | Status: DC | PRN
  Administered 2019-11-22: 25 ug/min via INTRAVENOUS

## 2019-11-22 MED ORDER — METOPROLOL TARTRATE 12.5 MG HALF TABLET
12.5000 mg | ORAL_TABLET | Freq: Two times a day (BID) | ORAL | Status: DC
  Filled 2019-11-22 (×2): qty 1

## 2019-11-22 MED ORDER — PROTAMINE SULFATE 10 MG/ML IV SOLN
INTRAVENOUS | Status: AC
  Filled 2019-11-22: qty 25

## 2019-11-22 MED ORDER — PROTAMINE SULFATE 10 MG/ML IV SOLN
INTRAVENOUS | Status: DC | PRN
  Administered 2019-11-22: 320 mg via INTRAVENOUS

## 2019-11-22 MED ORDER — LACTATED RINGERS IV SOLN: INTRAVENOUS | Status: DC

## 2019-11-22 MED ORDER — FAMOTIDINE IN NACL 20-0.9 MG/50ML-% IV SOLN
20.0000 mg | Freq: Two times a day (BID) | INTRAVENOUS | Status: DC
  Filled 2019-11-22: qty 50

## 2019-11-22 MED ORDER — ROCURONIUM BROMIDE 10 MG/ML (PF) SYRINGE
PREFILLED_SYRINGE | INTRAVENOUS | Status: DC | PRN
  Administered 2019-11-22: 100 mg via INTRAVENOUS
  Administered 2019-11-22 (×2): 50 mg via INTRAVENOUS
  Administered 2019-11-22: 10 mg via INTRAVENOUS
  Administered 2019-11-22: 50 mg via INTRAVENOUS

## 2019-11-22 MED ORDER — MIDAZOLAM HCL 5 MG/5ML IJ SOLN
INTRAMUSCULAR | Status: DC | PRN
  Administered 2019-11-22: 1 mg via INTRAVENOUS
  Administered 2019-11-22: 4 mg via INTRAVENOUS
  Administered 2019-11-22: 2 mg via INTRAVENOUS

## 2019-11-22 MED ORDER — POTASSIUM CHLORIDE 10 MEQ/50ML IV SOLN
10.0000 meq | INTRAVENOUS | Status: AC
  Administered 2019-11-22 (×2): 10 meq via INTRAVENOUS
  Filled 2019-11-22: qty 50

## 2019-11-22 MED ORDER — ARTIFICIAL TEARS OPHTHALMIC OINT
TOPICAL_OINTMENT | OPHTHALMIC | Status: AC
  Filled 2019-11-22: qty 3.5

## 2019-11-22 MED ORDER — MAGNESIUM SULFATE 4 GM/100ML IV SOLN: 4.0000 g | Freq: Once | INTRAVENOUS | Status: AC

## 2019-11-22 MED ORDER — ACETAMINOPHEN 160 MG/5ML PO SOLN: 650.0000 mg | Freq: Once | ORAL | Status: AC

## 2019-11-22 MED ORDER — DOCUSATE SODIUM 100 MG PO CAPS
200.0000 mg | ORAL_CAPSULE | Freq: Every day | ORAL | Status: DC
  Administered 2019-11-23 – 2019-12-07 (×13): 200 mg via ORAL
  Filled 2019-11-22 (×15): qty 2

## 2019-11-22 MED ORDER — MORPHINE SULFATE (PF) 2 MG/ML IV SOLN
1.0000 mg | INTRAVENOUS | Status: DC | PRN
  Administered 2019-11-22 – 2019-11-23 (×3): 1 mg via INTRAVENOUS
  Administered 2019-11-23 (×2): 2 mg via INTRAVENOUS
  Filled 2019-11-22 (×4): qty 1

## 2019-11-22 MED ORDER — ACETAMINOPHEN 160 MG/5ML PO SOLN: 1000.0000 mg | Freq: Four times a day (QID) | ORAL | Status: AC

## 2019-11-22 MED ORDER — HEMOSTATIC AGENTS (NO CHARGE) OPTIME
TOPICAL | Status: DC | PRN
  Administered 2019-11-22: 1 via TOPICAL

## 2019-11-22 MED ORDER — MIDAZOLAM HCL 2 MG/2ML IJ SOLN: 2.0000 mg | INTRAMUSCULAR | Status: DC | PRN

## 2019-11-22 MED ORDER — INSULIN REGULAR(HUMAN) IN NACL 100-0.9 UT/100ML-% IV SOLN
INTRAVENOUS | Status: DC
  Administered 2019-11-22: 0.7 [IU]/h via INTRAVENOUS

## 2019-11-22 MED ORDER — PHENYLEPHRINE 40 MCG/ML (10ML) SYRINGE FOR IV PUSH (FOR BLOOD PRESSURE SUPPORT)
PREFILLED_SYRINGE | INTRAVENOUS | Status: AC
  Filled 2019-11-22: qty 10

## 2019-11-22 MED ORDER — ONDANSETRON HCL 4 MG/2ML IJ SOLN
INTRAMUSCULAR | Status: AC
  Filled 2019-11-22: qty 2

## 2019-11-22 MED ORDER — ROCURONIUM BROMIDE 10 MG/ML (PF) SYRINGE
PREFILLED_SYRINGE | INTRAVENOUS | Status: AC
  Filled 2019-11-22: qty 10

## 2019-11-22 MED ORDER — BISACODYL 5 MG PO TBEC
10.0000 mg | DELAYED_RELEASE_TABLET | Freq: Every day | ORAL | Status: DC
  Administered 2019-11-23 – 2019-12-07 (×12): 10 mg via ORAL
  Filled 2019-11-22 (×14): qty 2

## 2019-11-22 MED ORDER — SODIUM CHLORIDE (PF) 0.9 % IJ SOLN
OROMUCOSAL | Status: DC | PRN
  Administered 2019-11-22 (×5): 4 mL via TOPICAL

## 2019-11-22 MED ORDER — ONDANSETRON HCL 4 MG/2ML IJ SOLN
4.0000 mg | Freq: Four times a day (QID) | INTRAMUSCULAR | Status: DC | PRN
  Administered 2019-11-25 – 2019-11-26 (×3): 4 mg via INTRAVENOUS
  Filled 2019-11-22 (×3): qty 2

## 2019-11-22 MED ORDER — LACTATED RINGERS IV SOLN
500.0000 mL | Freq: Once | INTRAVENOUS | Status: AC | PRN
  Administered 2019-11-23: 500 mL via INTRAVENOUS

## 2019-11-22 MED ORDER — VANCOMYCIN HCL IN DEXTROSE 1-5 GM/200ML-% IV SOLN
1000.0000 mg | Freq: Once | INTRAVENOUS | Status: AC
  Administered 2019-11-22: 1000 mg via INTRAVENOUS
  Filled 2019-11-22: qty 200

## 2019-11-22 MED ORDER — GLYCOPYRROLATE PF 0.2 MG/ML IJ SOSY
PREFILLED_SYRINGE | INTRAMUSCULAR | Status: AC
  Filled 2019-11-22: qty 1

## 2019-11-22 MED ORDER — NOREPINEPHRINE 4 MG/250ML-% IV SOLN
0.0000 ug/min | INTRAVENOUS | Status: DC
  Administered 2019-11-22: 6 ug/min via INTRAVENOUS
  Administered 2019-11-22 – 2019-11-23 (×2): 10 ug/min via INTRAVENOUS
  Administered 2019-11-23: 20 ug/min via INTRAVENOUS
  Administered 2019-11-23: 30 ug/min via INTRAVENOUS
  Administered 2019-11-23: 25 ug/min via INTRAVENOUS
  Filled 2019-11-22 (×6): qty 250

## 2019-11-22 MED ORDER — ASPIRIN 81 MG PO CHEW: 324.0000 mg | CHEWABLE_TABLET | Freq: Every day | ORAL | Status: DC

## 2019-11-22 MED ORDER — PROPOFOL 10 MG/ML IV BOLUS
INTRAVENOUS | Status: AC
  Filled 2019-11-22: qty 20

## 2019-11-22 MED ORDER — NITROGLYCERIN IN D5W 200-5 MCG/ML-% IV SOLN: 0.0000 ug/min | INTRAVENOUS | Status: DC

## 2019-11-22 MED ORDER — PHENYLEPHRINE HCL (PRESSORS) 10 MG/ML IV SOLN
INTRAVENOUS | Status: AC
  Filled 2019-11-22: qty 1

## 2019-11-22 MED ORDER — METOPROLOL TARTRATE 5 MG/5ML IV SOLN: 2.5000 mg | INTRAVENOUS | Status: DC | PRN

## 2019-11-22 MED ORDER — TRAMADOL HCL 50 MG PO TABS
50.0000 mg | ORAL_TABLET | ORAL | Status: DC | PRN
  Administered 2019-11-23 (×2): 100 mg via ORAL
  Filled 2019-11-22 (×2): qty 2

## 2019-11-22 MED ORDER — ROSUVASTATIN CALCIUM 20 MG PO TABS
20.0000 mg | ORAL_TABLET | Freq: Every day | ORAL | Status: DC
  Administered 2019-11-23 – 2019-11-24 (×2): 20 mg via ORAL
  Filled 2019-11-22 (×2): qty 1

## 2019-11-22 MED ORDER — ORAL CARE MOUTH RINSE
15.0000 mL | Freq: Two times a day (BID) | OROMUCOSAL | Status: DC
  Administered 2019-11-22 – 2019-12-07 (×29): 15 mL via OROMUCOSAL

## 2019-11-22 MED ORDER — ALBUMIN HUMAN 5 % IV SOLN
250.0000 mL | INTRAVENOUS | Status: AC | PRN
  Administered 2019-11-22 (×3): 12.5 g via INTRAVENOUS
  Filled 2019-11-22 (×2): qty 250

## 2019-11-22 MED ORDER — METOPROLOL TARTRATE 25 MG/10 ML ORAL SUSPENSION: 12.5000 mg | Freq: Two times a day (BID) | ORAL | Status: DC

## 2019-11-22 MED ORDER — CHLORHEXIDINE GLUCONATE 0.12 % MT SOLN
15.0000 mL | OROMUCOSAL | Status: AC
  Administered 2019-11-22: 15 mL via OROMUCOSAL

## 2019-11-22 MED ORDER — LEVOTHYROXINE SODIUM 50 MCG PO TABS
50.0000 ug | ORAL_TABLET | Freq: Every day | ORAL | Status: DC
  Administered 2019-11-23 – 2019-12-07 (×15): 50 ug via ORAL
  Filled 2019-11-22 (×15): qty 1

## 2019-11-22 MED ORDER — OXYCODONE HCL 5 MG PO TABS
5.0000 mg | ORAL_TABLET | ORAL | Status: DC | PRN
  Administered 2019-11-23: 5 mg via ORAL
  Administered 2019-11-23 – 2019-11-24 (×4): 10 mg via ORAL
  Filled 2019-11-22 (×3): qty 2
  Filled 2019-11-22: qty 1
  Filled 2019-11-22: qty 2

## 2019-11-22 MED ORDER — ASPIRIN EC 325 MG PO TBEC
325.0000 mg | DELAYED_RELEASE_TABLET | Freq: Every day | ORAL | Status: DC
  Administered 2019-11-23 – 2019-12-07 (×15): 325 mg via ORAL
  Filled 2019-11-22 (×15): qty 1

## 2019-11-22 MED ORDER — FENTANYL CITRATE (PF) 250 MCG/5ML IJ SOLN
INTRAMUSCULAR | Status: DC | PRN
  Administered 2019-11-22: 200 ug via INTRAVENOUS
  Administered 2019-11-22: 50 ug via INTRAVENOUS
  Administered 2019-11-22: 25 ug via INTRAVENOUS
  Administered 2019-11-22: 100 ug via INTRAVENOUS
  Administered 2019-11-22: 150 ug via INTRAVENOUS
  Administered 2019-11-22: 50 ug via INTRAVENOUS
  Administered 2019-11-22: 25 ug via INTRAVENOUS
  Administered 2019-11-22 (×2): 100 ug via INTRAVENOUS
  Administered 2019-11-22: 500 ug via INTRAVENOUS

## 2019-11-22 MED ORDER — SODIUM CHLORIDE 0.9% FLUSH
3.0000 mL | Freq: Two times a day (BID) | INTRAVENOUS | Status: DC
  Administered 2019-11-23 – 2019-12-06 (×17): 3 mL via INTRAVENOUS

## 2019-11-22 MED ORDER — SODIUM CHLORIDE 0.9 % IV SOLN: INTRAVENOUS | Status: DC

## 2019-11-22 MED ORDER — DEXMEDETOMIDINE HCL IN NACL 400 MCG/100ML IV SOLN
0.0000 ug/kg/h | INTRAVENOUS | Status: DC
  Administered 2019-11-22: 0.7 ug/kg/h via INTRAVENOUS

## 2019-11-22 MED ORDER — FENTANYL CITRATE (PF) 250 MCG/5ML IJ SOLN
INTRAMUSCULAR | Status: AC
  Filled 2019-11-22: qty 30

## 2019-11-22 MED ORDER — ACETAMINOPHEN 650 MG RE SUPP
650.0000 mg | Freq: Once | RECTAL | Status: AC
  Administered 2019-11-22: 650 mg via RECTAL

## 2019-11-22 MED ORDER — PHENYLEPHRINE HCL-NACL 20-0.9 MG/250ML-% IV SOLN
0.0000 ug/min | INTRAVENOUS | Status: DC
  Administered 2019-11-22: 12 ug/min via INTRAVENOUS
  Administered 2019-11-22: 70 ug/min via INTRAVENOUS
  Administered 2019-11-22: 10 ug/min via INTRAVENOUS
  Administered 2019-11-22 – 2019-11-23 (×2): 40 ug/min via INTRAVENOUS
  Filled 2019-11-22 (×2): qty 250

## 2019-11-22 MED ORDER — DOPAMINE-DEXTROSE 3.2-5 MG/ML-% IV SOLN
0.0000 ug/kg/min | INTRAVENOUS | Status: DC
  Filled 2019-11-22: qty 250

## 2019-11-22 MED ORDER — SUGAMMADEX SODIUM 500 MG/5ML IV SOLN
INTRAVENOUS | Status: AC
  Filled 2019-11-22: qty 10

## 2019-11-22 MED ORDER — SODIUM CHLORIDE 0.9 % IV SOLN: INTRAVENOUS | Status: DC | PRN

## 2019-11-22 MED ORDER — NEOSTIGMINE METHYLSULFATE 3 MG/3ML IV SOSY
PREFILLED_SYRINGE | INTRAVENOUS | Status: AC
  Filled 2019-11-22: qty 3

## 2019-11-22 MED ORDER — ROCURONIUM BROMIDE 10 MG/ML (PF) SYRINGE
PREFILLED_SYRINGE | INTRAVENOUS | Status: AC
  Filled 2019-11-22: qty 20

## 2019-11-22 MED ORDER — 0.9 % SODIUM CHLORIDE (POUR BTL) OPTIME
TOPICAL | Status: DC | PRN
  Administered 2019-11-22: 5000 mL

## 2019-11-22 MED ORDER — SODIUM CHLORIDE (PF) 0.9 % IJ SOLN
INTRAMUSCULAR | Status: AC
  Filled 2019-11-22: qty 10

## 2019-11-22 MED ORDER — ACETAMINOPHEN 500 MG PO TABS
1000.0000 mg | ORAL_TABLET | Freq: Four times a day (QID) | ORAL | Status: AC
  Administered 2019-11-23 – 2019-11-27 (×12): 1000 mg via ORAL
  Filled 2019-11-22 (×13): qty 2

## 2019-11-22 SURGICAL SUPPLY — 93 items
ADAPTER CARDIO PERF ANTE/RETRO (ADAPTER) ×3 IMPLANT
ADH SKN CLS APL DERMABOND .7 (GAUZE/BANDAGES/DRESSINGS) ×2
ADPR PRFSN 84XANTGRD RTRGD (ADAPTER) ×2
BAG DECANTER FOR FLEXI CONT (MISCELLANEOUS) ×3 IMPLANT
BLADE STERNUM SYSTEM 6 (BLADE) ×3 IMPLANT
BLADE SURG 15 STRL LF DISP TIS (BLADE) ×2 IMPLANT
BLADE SURG 15 STRL SS (BLADE) ×3
BNDG ELASTIC 4X5.8 VLCR STR LF (GAUZE/BANDAGES/DRESSINGS) ×3 IMPLANT
BNDG ELASTIC 6X5.8 VLCR STR LF (GAUZE/BANDAGES/DRESSINGS) ×3 IMPLANT
BNDG GAUZE ELAST 4 BULKY (GAUZE/BANDAGES/DRESSINGS) ×3 IMPLANT
CANISTER SUCT 3000ML PPV (MISCELLANEOUS) ×3 IMPLANT
CANNULA GUNDRY RCSP 15FR (MISCELLANEOUS) ×3 IMPLANT
CATH CPB KIT GERHARDT (MISCELLANEOUS) ×3 IMPLANT
CATH HEART VENT LEFT (CATHETERS) ×2 IMPLANT
CATH THORACIC 28FR (CATHETERS) ×3 IMPLANT
CATH/SQUID NICHOLS JEHLE COR (CATHETERS) ×3 IMPLANT
CNTNR URN SCR LID CUP LEK RST (MISCELLANEOUS) ×4 IMPLANT
CONN ST 1/4X3/8  BEN (MISCELLANEOUS) ×3
CONN ST 1/4X3/8 BEN (MISCELLANEOUS) ×2 IMPLANT
CONT SPEC 4OZ STRL OR WHT (MISCELLANEOUS) ×6
DERMABOND ADVANCED (GAUZE/BANDAGES/DRESSINGS) ×1
DERMABOND ADVANCED .7 DNX12 (GAUZE/BANDAGES/DRESSINGS) ×2 IMPLANT
DRAIN CHANNEL 28F RND 3/8 FF (WOUND CARE) ×6 IMPLANT
DRAPE CARDIOVASCULAR INCISE (DRAPES) ×3
DRAPE SLUSH/WARMER DISC (DRAPES) ×3 IMPLANT
DRAPE SRG 135X102X78XABS (DRAPES) ×2 IMPLANT
DRSG AQUACEL AG ADV 3.5X14 (GAUZE/BANDAGES/DRESSINGS) ×3 IMPLANT
ELECT BLADE 4.0 EZ CLEAN MEGAD (MISCELLANEOUS) ×6
ELECT CAUTERY BLADE 6.4 (BLADE) ×3 IMPLANT
ELECT REM PT RETURN 9FT ADLT (ELECTROSURGICAL) ×6
ELECTRODE BLDE 4.0 EZ CLN MEGD (MISCELLANEOUS) ×4 IMPLANT
ELECTRODE REM PT RTRN 9FT ADLT (ELECTROSURGICAL) ×4 IMPLANT
FELT TEFLON 1X6 (MISCELLANEOUS) ×6 IMPLANT
FILTER SMOKE EVAC ULPA (FILTER) ×3 IMPLANT
GAUZE SPONGE 4X4 12PLY STRL (GAUZE/BANDAGES/DRESSINGS) ×6 IMPLANT
GLOVE BIO SURGEON STRL SZ 6.5 (GLOVE) ×15 IMPLANT
GLOVE BIO SURGEON STRL SZ8 (GLOVE) ×6 IMPLANT
GOWN STRL REUS W/ TWL LRG LVL3 (GOWN DISPOSABLE) ×20 IMPLANT
GOWN STRL REUS W/TWL LRG LVL3 (GOWN DISPOSABLE) ×30
HEMOSTAT POWDER SURGIFOAM 1G (HEMOSTASIS) ×9 IMPLANT
HEMOSTAT SURGICEL 2X14 (HEMOSTASIS) ×6 IMPLANT
KIT BASIN OR (CUSTOM PROCEDURE TRAY) ×3 IMPLANT
KIT CATH SUCT 8FR (CATHETERS) ×3 IMPLANT
KIT SUCTION CATH 14FR (SUCTIONS) ×9 IMPLANT
KIT TURNOVER KIT B (KITS) ×3 IMPLANT
KIT VASOVIEW HEMOPRO 2 VH 4000 (KITS) ×3 IMPLANT
LEAD PACING MYOCARDI (MISCELLANEOUS) ×3 IMPLANT
LINE VENT (MISCELLANEOUS) ×3 IMPLANT
MARKER GRAFT CORONARY BYPASS (MISCELLANEOUS) ×15 IMPLANT
NS IRRIG 1000ML POUR BTL (IV SOLUTION) ×15 IMPLANT
PACK E OPEN HEART (SUTURE) ×3 IMPLANT
PACK OPEN HEART (CUSTOM PROCEDURE TRAY) ×3 IMPLANT
PAD ARMBOARD 7.5X6 YLW CONV (MISCELLANEOUS) ×6 IMPLANT
PAD ELECT DEFIB RADIOL ZOLL (MISCELLANEOUS) ×3 IMPLANT
PENCIL BUTTON HOLSTER BLD 10FT (ELECTRODE) ×3 IMPLANT
PENCIL SMOKE EVACUATOR (MISCELLANEOUS) ×3 IMPLANT
POSITIONER HEAD DONUT 9IN (MISCELLANEOUS) ×3 IMPLANT
SET CARDIOPLEGIA MPS 5001102 (MISCELLANEOUS) ×3 IMPLANT
SLEEVE SUCTION 125 (MISCELLANEOUS) ×3 IMPLANT
SPONGE LAP 18X18 X RAY DECT (DISPOSABLE) ×15 IMPLANT
SUT BONE WAX W31G (SUTURE) ×3 IMPLANT
SUT ETHIBON 2 0 V 52N 30 (SUTURE) ×6 IMPLANT
SUT ETHIBOND 2 0 SH (SUTURE) ×6
SUT ETHIBOND 2 0 SH 36X2 (SUTURE) ×4 IMPLANT
SUT PROLENE 3 0 RB 1 (SUTURE) ×3 IMPLANT
SUT PROLENE 3 0 SH DA (SUTURE) ×6 IMPLANT
SUT PROLENE 3 0 SH1 36 (SUTURE) ×6 IMPLANT
SUT PROLENE 4 0 RB 1 (SUTURE) ×12
SUT PROLENE 4 0 TF (SUTURE) ×6 IMPLANT
SUT PROLENE 4-0 RB1 .5 CRCL 36 (SUTURE) ×8 IMPLANT
SUT PROLENE 5 0 C 1 36 (SUTURE) ×3 IMPLANT
SUT PROLENE 6 0 C 1 30 (SUTURE) ×6 IMPLANT
SUT PROLENE 6 0 CC (SUTURE) ×12 IMPLANT
SUT PROLENE 7 0 BV1 MDA (SUTURE) ×6 IMPLANT
SUT PROLENE 8 0 BV175 6 (SUTURE) ×18 IMPLANT
SUT SILK  1 MH (SUTURE) ×3
SUT SILK 1 MH (SUTURE) ×2 IMPLANT
SUT SILK 2 0 SH CR/8 (SUTURE) ×3 IMPLANT
SUT STEEL 6MS V (SUTURE) ×6 IMPLANT
SUT STEEL SZ 6 DBL 3X14 BALL (SUTURE) ×3 IMPLANT
SUT VIC AB 1 CTX 18 (SUTURE) ×6 IMPLANT
SYSTEM SAHARA CHEST DRAIN ATS (WOUND CARE) ×3 IMPLANT
TAPE CLOTH SOFT 2X10 (GAUZE/BANDAGES/DRESSINGS) ×3 IMPLANT
TAPE CLOTH SURG 4X10 WHT LF (GAUZE/BANDAGES/DRESSINGS) ×3 IMPLANT
TOWEL GREEN STERILE (TOWEL DISPOSABLE) ×3 IMPLANT
TOWEL GREEN STERILE FF (TOWEL DISPOSABLE) ×3 IMPLANT
TRAP FLUID SMOKE EVACUATOR (MISCELLANEOUS) ×3 IMPLANT
TRAY FOLEY SLVR 16FR TEMP STAT (SET/KITS/TRAYS/PACK) ×3 IMPLANT
TUBING LAP HI FLOW INSUFFLATIO (TUBING) ×3 IMPLANT
UNDERPAD 30X36 HEAVY ABSORB (UNDERPADS AND DIAPERS) ×3 IMPLANT
VALVE AORTIC SZ23 INSP/RESIL (Prosthesis & Implant Heart) ×3 IMPLANT
VENT LEFT HEART 12002 (CATHETERS) ×3
WATER STERILE IRR 1000ML POUR (IV SOLUTION) ×6 IMPLANT

## 2019-11-22 NOTE — Progress Notes (Signed)
Patient ID: William Walker, male   DOB: 08-Feb-1927, 86 y.o.   MRN: [id number] EVENING ROUNDS NOTE :     [address]       [place] [address]             [phone]                 Day of Surgery Procedure(s) (LRB): CORONARY ARTERY BYPASS GRAFTING (CABG), ON PUMP, TIMES FOUR , USING LEFT INTERNAL MAMMARY ARTERY AND ENDOSCOPICALLY HARVESTED RIGHT GREATER SAPHENOUS VEIN (N/A) AORTIC VALVE REPLACEMENT (AVR) USING INSPIRIS 23MM (N/A) TRANSESOPHAGEAL ECHOCARDIOGRAM (TEE) (N/A)  Total Length of Stay:  LOS: 7 days  BP (!) 96/58   Pulse (!) 109   Temp (!) 96.3 F (35.7 C)   Resp (!) 0   Ht 5\' 11"  (1.803 m)   Wt 91.1 kg   SpO2 97%   BMI 28.01 kg/m   .Intake/Output      06/20 0701 - 06/21 0700 06/21 0701 - 06/22 0700   P.O. 120    I.V. (mL/kg) 274.9 (3) 2400 (26.3)   Blood  540   IV Piggyback 149.7 1050   Total Intake(mL/kg) 544.5 (6) 3990 (43.8)   Urine (mL/kg/hr) 2970 (1.4) 1200 (1.2)   Stool 0    Blood  1000   Total Output 2970 2200   Net -2425.5 +1790        Urine Occurrence 2 x    Stool Occurrence 2 x      . sodium chloride 20 mL/hr at 11/22/19 1630  . [START ON 11/23/2019] sodium chloride    . sodium chloride 10 mL/hr at 11/22/19 1737  . albumin human 12.5 g (11/22/19 1723)  . dexmedetomidine (PRECEDEX) IV infusion 0.7 mcg/kg/hr (11/22/19 1630)  . famotidine (PEPCID) IV    . insulin 0.7 Units/hr (11/22/19 1630)  . lactated ringers    . lactated ringers    . lactated ringers 20 mL/hr at 11/22/19 1630  . magnesium sulfate 4 g (11/22/19 1701)  . nitroGLYCERIN    . norepinephrine (LEVOPHED) Adult infusion 10 mcg/min (11/22/19 1630)  . phenylephrine (NEO-SYNEPHRINE) Adult infusion 10 mcg/min (11/22/19 1630)  . piperacillin-tazobactam (ZOSYN)  IV 3.375 g (11/22/19 0504)  . potassium chloride    . vancomycin       Lab Results  Component Value Date   WBC 13.4 (H) 11/22/2019   HGB 8.5 (L) 11/22/2019   HCT 25.0 (L) 11/22/2019   PLT 133 (L)  11/22/2019   GLUCOSE 135 (H) 11/22/2019   CHOL 166 11/16/2019   TRIG 88 11/16/2019   HDL 47 11/16/2019   LDLCALC 101 (H) 11/16/2019   ALT 51 (H) 11/22/2019   AST 84 (H) 11/22/2019   NA 136 11/22/2019   K 3.6 11/22/2019   CL 100 11/22/2019   CREATININE 1.69 (H) 11/22/2019   BUN 25 (H) 11/22/2019   CO2 24 11/22/2019   TSH 2.570 02/11/2019   INR 1.2 11/22/2019   HGBA1C 7.4 (H) 11/15/2019   Stable in unit Not bleeding ci 2.1 on 6 gtts levaphed    [name] MD  Beeper [phone] Office [phone] 11/22/2019 6:06 PM

## 2019-11-22 NOTE — Progress Notes (Signed)
   11/22/19 0600  Clinical Encounter Type  Visited With Patient  Visit Type Follow-up  Spiritual Encounters  Spiritual Needs Prayer   Chaplain engaged in follow-up visit with William Walker.  Chaplain offered prayer over him before his surgery.  William Walker became emotional speaking about his family and how good they have been to him.  Chaplain offered support.  Chaplain will follow-up.

## 2019-11-22 NOTE — Procedures (Signed)
Extubation Procedure Note  Patient Details:   Name: William Walker DOB: 03/01/1927 MRN: [id number]   Airway Documentation:    Vent end date: 11/22/19 Vent end time: 2255   Evaluation  O2 sats: stable throughout Complications: No apparent complications Patient did tolerate procedure well. Bilateral Breath Sounds: Clear, Diminished   Yes   Patient perfomed NIF -20 and FVC 725ml. RT extubated patient to 4L [place]. Patient able to speak clearly. Vitals are stable at this time.  Alvera Singh 11/22/2019, 10:59 PM

## 2019-11-22 NOTE — Anesthesia Postprocedure Evaluation (Signed)
Anesthesia Post Note  Patient: William Walker  Procedure(s) Performed: CORONARY ARTERY BYPASS GRAFTING (CABG), ON PUMP, TIMES FOUR , USING LEFT INTERNAL MAMMARY ARTERY AND ENDOSCOPICALLY HARVESTED RIGHT GREATER SAPHENOUS VEIN (N/A Chest) AORTIC VALVE REPLACEMENT (AVR) USING INSPIRIS 23MM (N/A ) TRANSESOPHAGEAL ECHOCARDIOGRAM (TEE) (N/A )     Patient location during evaluation: SICU Anesthesia Type: General Level of consciousness: sedated Pain management: pain level controlled Vital Signs Assessment: post-procedure vital signs reviewed and stable Respiratory status: patient remains intubated per anesthesia plan Cardiovascular status: stable Postop Assessment: no apparent nausea or vomiting Anesthetic complications: no   No complications documented.  Last Vitals:  Vitals:   11/22/19 0715 11/22/19 1634  BP:  (!) 100/48  Pulse: (!) 51 (!) 108  Resp: 11 12  Temp:    SpO2: 99% 95%    Last Pain:  Vitals:   11/22/19 0300  TempSrc: Oral  PainSc: Asleep                 Bradin Mcadory,W. EDMOND

## 2019-11-22 NOTE — Transfer of Care (Signed)
Immediate Anesthesia Transfer of Care Note  Patient: William Walker  Procedure(s) Performed: CORONARY ARTERY BYPASS GRAFTING (CABG), ON PUMP, TIMES FOUR , USING LEFT INTERNAL MAMMARY ARTERY AND ENDOSCOPICALLY HARVESTED RIGHT GREATER SAPHENOUS VEIN (N/A Chest) AORTIC VALVE REPLACEMENT (AVR) USING INSPIRIS 23MM (N/A ) TRANSESOPHAGEAL ECHOCARDIOGRAM (TEE) (N/A )  Patient Location: ICU  Anesthesia Type:General  Level of Consciousness: Patient remains intubated per anesthesia plan  Airway & Oxygen Therapy: Patient remains intubated per anesthesia plan and Patient placed on Ventilator (see vital sign flow sheet for setting)  Post-op Assessment: Report given to RN and Post -op Vital signs reviewed and stable  Post vital signs: Reviewed and stable  Last Vitals:  Vitals Value Taken Time  BP 98/60 11/22/19 1634  Temp 35.6 C 11/22/19 1638  Pulse 108 11/22/19 1638  Resp 14 11/22/19 1638  SpO2 97 % 11/22/19 1638  Vitals shown include unvalidated device data.  Last Pain:  Vitals:   11/22/19 0300  TempSrc: Oral  PainSc: Asleep      Patients Stated Pain Goal: 2 (39/03/00 9233)  Complications: No complications documented.

## 2019-11-22 NOTE — Brief Op Note (Addendum)
      [address]       [place], [address]             [phone]       11/22/2019  6:06 PM  PATIENT:  William Walker  86 y.o. male  PRE-OPERATIVE DIAGNOSIS:  Coronary Artery Disease, Severe Aortic Stenosis   POST-OPERATIVE DIAGNOSIS: Coronary Artery Disease, Severe Aortic Stenosis  PROCEDURE:   CORONARY ARTERY BYPASS GRAFTING (CABG), ON PUMP, TIMES FOUR , USING LEFT INTERNAL MAMMARY ARTERY AND ENDOSCOPICALLY HARVESTED RIGHT GREATER SAPHENOUS VEIN   SVG->  RAMUS INTERMEDIATE -> OM1 (SEQUENTIAL) SVG->  DISTAL RCA LIMA -> LAD  AORTIC VALVE REPLACEMENT USING INSPIRIS 23MM (N/A) TRANSESOPHAGEAL ECHOCARDIOGRAM   SURGEON:   [name], MD  PHYSICIAN ASSISTANT:  [place]  ANESTHESIA:   general  EBL:  Per anesthesia and perfusion records  BLOOD ADMINISTERED:none  DRAINS: Mediastinal and left pleural tubes   LOCAL MEDICATIONS USED:  NONE  SPECIMEN:  Source of Specimen:  Aortic valve leaflets  DISPOSITION OF SPECIMEN:  PATHOLOGY  COUNTS:  YES  DICTATION: .Dragon Dictation  PLAN OF CARE: Admit to inpatient   PATIENT DISPOSITION:  ICU - intubated and hemodynamically stable.   Delay start of Pharmacological VTE agent (>24hrs) due to surgical blood loss or risk of bleeding: yes

## 2019-11-22 NOTE — Anesthesia Procedure Notes (Signed)
Arterial Line Insertion Start/End6/21/2021 6:45 AM, 11/22/2019 6:50 AM Performed by: Lowella Dell, CRNA, CRNA  Patient location: Pre-op. Preanesthetic checklist: patient identified, IV checked, site marked, risks and benefits discussed, surgical consent, monitors and equipment checked, pre-op evaluation, timeout performed and anesthesia consent Lidocaine 1% used for infiltration and patient sedated Left, radial was placed Catheter size: 20 G Maximum sterile barriers used   Attempts: 1 Procedure performed without using ultrasound guided technique. Following insertion, Biopatch and dressing applied. Post procedure assessment: normal  Patient tolerated the procedure well with no immediate complications.

## 2019-11-22 NOTE — Anesthesia Procedure Notes (Signed)
Central Venous Catheter Insertion Performed by: Albertha Ghee, MD, anesthesiologist Start/End6/21/2021 6:41 AM, 11/22/2019 6:50 AM Patient location: Pre-op. Preanesthetic checklist: patient identified, IV checked, site marked, risks and benefits discussed, surgical consent, monitors and equipment checked, pre-op evaluation, timeout performed and anesthesia consent Position: Trendelenburg Lidocaine 1% used for infiltration and patient sedated Hand hygiene performed , maximum sterile barriers used  and Seldinger technique used Catheter size: 8.5 Fr Central line and PA cath was placed.Sheath introducer Swan type:thermodilation Procedure performed using ultrasound guided technique. Ultrasound Notes:anatomy identified, needle tip was noted to be adjacent to the nerve/plexus identified, no ultrasound evidence of intravascular and/or intraneural injection and image(s) printed for medical record Attempts: 1 Following insertion, line sutured, dressing applied and Biopatch. Post procedure assessment: blood return through all ports, free fluid flow and no air  Patient tolerated the procedure well with no immediate complications.

## 2019-11-22 NOTE — Progress Notes (Signed)
Echocardiogram Echocardiogram Transesophageal has been performed.  William Walker F [place] Laser 11/22/2019, 8:54 AM 

## 2019-11-22 NOTE — Progress Notes (Signed)
      [address]       [place], [address]             [phone]    Pre Procedure note for inpatients:   William Walker has been scheduled for Procedure(s): CORONARY ARTERY BYPASS GRAFTING (CABG) (N/A) AORTIC VALVE REPLACEMENT (AVR) (N/A) TRANSESOPHAGEAL ECHOCARDIOGRAM (TEE) (N/A) today. The various methods of treatment have been discussed with the patient. After consideration of the risks, benefits and treatment options the patient has consented to the planned procedure.   The patient has been seen and labs reviewed. There are no changes in the patient's condition to prevent proceeding with the planned procedure today.  Recent labs:  Lab Results  Component Value Date   WBC 7.6 11/22/2019   HGB 11.1 (L) 11/22/2019   HCT 33.4 (L) 11/22/2019   PLT 211 11/22/2019   GLUCOSE 135 (H) 11/22/2019   CHOL 166 11/16/2019   TRIG 88 11/16/2019   HDL 47 11/16/2019   LDLCALC 101 (H) 11/16/2019   ALT 51 (H) 11/22/2019   AST 84 (H) 11/22/2019   NA 136 11/22/2019   K 3.6 11/22/2019   CL 100 11/22/2019   CREATININE 1.69 (H) 11/22/2019   BUN 25 (H) 11/22/2019   CO2 24 11/22/2019   TSH 2.570 02/11/2019   INR 1.2 11/22/2019   HGBA1C 7.4 (H) 11/15/2019    Chronic Kidney Disease   Stage I     GFR >90  Stage II    GFR 60-89  Stage IIIA GFR 45-59  Stage IIIB GFR 30-44  Stage IV   GFR 15-29  Stage V    GFR  <15  Lab Results  Component Value Date   CREATININE 1.69 (H) 11/22/2019   Estimated Creatinine Clearance: 32.9 mL/min (A) (by C-G formula based on SCr of 1.69 mg/dL (H)). Stage III B ckd preop   [name], MD 11/22/2019 7:07 AM

## 2019-11-22 NOTE — Anesthesia Procedure Notes (Signed)
Procedure Name: Intubation Date/Time: 11/22/2019 7:43 AM Performed by: Lowella Dell, CRNA Pre-anesthesia Checklist: Patient identified, Emergency Drugs available, Suction available and Patient being monitored Patient Re-evaluated:Patient Re-evaluated prior to induction Oxygen Delivery Method: Circle System Utilized Preoxygenation: Pre-oxygenation with 100% oxygen Induction Type: IV induction Ventilation: Mask ventilation without difficulty and Oral airway inserted - appropriate to patient size Laryngoscope Size: Mac and 4 Tube type: Oral Tube size: 8.0 mm Number of attempts: 1 Airway Equipment and Method: Stylet Placement Confirmation: ETT inserted through vocal cords under direct vision,  positive ETCO2 and breath sounds checked- equal and bilateral Secured at: 22 cm Tube secured with: Tape Dental Injury: Teeth and Oropharynx as per pre-operative assessment

## 2019-11-23 ENCOUNTER — Inpatient Hospital Stay (HOSPITAL_COMMUNITY): Payer: Medicare Other

## 2019-11-23 ENCOUNTER — Encounter (HOSPITAL_COMMUNITY): Payer: Self-pay | Admitting: Cardiothoracic Surgery

## 2019-11-23 DIAGNOSIS — Z953 Presence of xenogenic heart valve: Secondary | ICD-10-CM

## 2019-11-23 LAB — POCT I-STAT 7, (LYTES, BLD GAS, ICA,H+H)
Acid-base deficit: 3 mmol/L — ABNORMAL HIGH (ref 0.0–2.0)
Acid-base deficit: 7 mmol/L — ABNORMAL HIGH (ref 0.0–2.0)
Bicarbonate: 18.4 mmol/L — ABNORMAL LOW (ref 20.0–28.0)
Bicarbonate: 20.4 mmol/L (ref 20.0–28.0)
Calcium, Ion: 1.02 mmol/L — ABNORMAL LOW (ref 1.15–1.40)
Calcium, Ion: 1.04 mmol/L — ABNORMAL LOW (ref 1.15–1.40)
HCT: 22 % — ABNORMAL LOW (ref 39.0–52.0)
HCT: 23 % — ABNORMAL LOW (ref 39.0–52.0)
Hemoglobin: 7.5 g/dL — ABNORMAL LOW (ref 13.0–17.0)
Hemoglobin: 7.8 g/dL — ABNORMAL LOW (ref 13.0–17.0)
O2 Saturation: 94 %
O2 Saturation: 99 %
Patient temperature: 37.2
Patient temperature: 37.2
Potassium: 4.4 mmol/L (ref 3.5–5.1)
Potassium: 4.9 mmol/L (ref 3.5–5.1)
Sodium: 139 mmol/L (ref 135–145)
Sodium: 139 mmol/L (ref 135–145)
TCO2: 19 mmol/L — ABNORMAL LOW (ref 22–32)
TCO2: 21 mmol/L — ABNORMAL LOW (ref 22–32)
pCO2 arterial: 31 mmHg — ABNORMAL LOW (ref 32.0–48.0)
pCO2 arterial: 35.5 mmHg (ref 32.0–48.0)
pH, Arterial: 7.323 — ABNORMAL LOW (ref 7.350–7.450)
pH, Arterial: 7.427 (ref 7.350–7.450)
pO2, Arterial: 112 mmHg — ABNORMAL HIGH (ref 83.0–108.0)
pO2, Arterial: 78 mmHg — ABNORMAL LOW (ref 83.0–108.0)

## 2019-11-23 LAB — CBC
HCT: 24.4 % — ABNORMAL LOW (ref 39.0–52.0)
HCT: 26.5 % — ABNORMAL LOW (ref 39.0–52.0)
Hemoglobin: 8.2 g/dL — ABNORMAL LOW (ref 13.0–17.0)
Hemoglobin: 8.8 g/dL — ABNORMAL LOW (ref 13.0–17.0)
MCH: 34.7 pg — ABNORMAL HIGH (ref 26.0–34.0)
MCH: 33.2 pg (ref 26.0–34.0)
MCHC: 33.6 g/dL (ref 30.0–36.0)
MCHC: 33.2 g/dL (ref 30.0–36.0)
MCV: 103.4 fL — ABNORMAL HIGH (ref 80.0–100.0)
MCV: 100 fL (ref 80.0–100.0)
Platelets: 143 10*3/uL — ABNORMAL LOW (ref 150–400)
Platelets: 140 10*3/uL — ABNORMAL LOW (ref 150–400)
RBC: 2.36 MIL/uL — ABNORMAL LOW (ref 4.22–5.81)
RBC: 2.65 MIL/uL — ABNORMAL LOW (ref 4.22–5.81)
RDW: 13.7 % (ref 11.5–15.5)
RDW: 17.6 % — ABNORMAL HIGH (ref 11.5–15.5)
WBC: 8.5 10*3/uL (ref 4.0–10.5)
WBC: 13.6 10*3/uL — ABNORMAL HIGH (ref 4.0–10.5)
nRBC: 0 % (ref 0.0–0.2)
nRBC: 0 % (ref 0.0–0.2)

## 2019-11-23 LAB — GLUCOSE, CAPILLARY
Glucose-Capillary: 126 mg/dL — ABNORMAL HIGH (ref 70–99)
Glucose-Capillary: 132 mg/dL — ABNORMAL HIGH (ref 70–99)
Glucose-Capillary: 137 mg/dL — ABNORMAL HIGH (ref 70–99)
Glucose-Capillary: 137 mg/dL — ABNORMAL HIGH (ref 70–99)
Glucose-Capillary: 137 mg/dL — ABNORMAL HIGH (ref 70–99)
Glucose-Capillary: 139 mg/dL — ABNORMAL HIGH (ref 70–99)
Glucose-Capillary: 143 mg/dL — ABNORMAL HIGH (ref 70–99)
Glucose-Capillary: 144 mg/dL — ABNORMAL HIGH (ref 70–99)
Glucose-Capillary: 163 mg/dL — ABNORMAL HIGH (ref 70–99)
Glucose-Capillary: 221 mg/dL — ABNORMAL HIGH (ref 70–99)
Glucose-Capillary: 299 mg/dL — ABNORMAL HIGH (ref 70–99)

## 2019-11-23 LAB — COOXEMETRY PANEL
Carboxyhemoglobin: 0.7 % (ref 0.5–1.5)
Carboxyhemoglobin: 0.9 % (ref 0.5–1.5)
Methemoglobin: 0.7 % (ref 0.0–1.5)
Methemoglobin: 1.3 % (ref 0.0–1.5)
O2 Saturation: 41.1 %
O2 Saturation: 45.2 %
Total hemoglobin: 8.1 g/dL — ABNORMAL LOW (ref 12.0–16.0)
Total hemoglobin: 10 g/dL — ABNORMAL LOW (ref 12.0–16.0)

## 2019-11-23 LAB — BASIC METABOLIC PANEL
Anion gap: 10 (ref 5–15)
Anion gap: 15 (ref 5–15)
BUN: 18 mg/dL (ref 8–23)
BUN: 25 mg/dL — ABNORMAL HIGH (ref 8–23)
CO2: 19 mmol/L — ABNORMAL LOW (ref 22–32)
CO2: 13 mmol/L — ABNORMAL LOW (ref 22–32)
Calcium: 7.3 mg/dL — ABNORMAL LOW (ref 8.9–10.3)
Calcium: 7.1 mg/dL — ABNORMAL LOW (ref 8.9–10.3)
Chloride: 108 mmol/L (ref 98–111)
Chloride: 103 mmol/L (ref 98–111)
Creatinine, Ser: 1.53 mg/dL — ABNORMAL HIGH (ref 0.61–1.24)
Creatinine, Ser: 2.13 mg/dL — ABNORMAL HIGH (ref 0.61–1.24)
GFR calc Af Amer: 45 mL/min — ABNORMAL LOW (ref >60)
GFR calc Af Amer: 30 mL/min — ABNORMAL LOW (ref >60)
GFR calc non Af Amer: 39 mL/min — ABNORMAL LOW (ref >60)
GFR calc non Af Amer: 26 mL/min — ABNORMAL LOW (ref >60)
Glucose, Bld: 138 mg/dL — ABNORMAL HIGH (ref 70–99)
Glucose, Bld: 303 mg/dL — ABNORMAL HIGH (ref 70–99)
Potassium: 4.4 mmol/L (ref 3.5–5.1)
Potassium: 4.5 mmol/L (ref 3.5–5.1)
Sodium: 137 mmol/L (ref 135–145)
Sodium: 131 mmol/L — ABNORMAL LOW (ref 135–145)

## 2019-11-23 LAB — MAGNESIUM
Magnesium: 2.9 mg/dL — ABNORMAL HIGH (ref 1.7–2.4)
Magnesium: 2.7 mg/dL — ABNORMAL HIGH (ref 1.7–2.4)

## 2019-11-23 LAB — PREPARE RBC (CROSSMATCH)

## 2019-11-23 MED ORDER — SODIUM CHLORIDE 0.9% FLUSH: 10.0000 mL | INTRAVENOUS | Status: DC | PRN

## 2019-11-23 MED ORDER — SODIUM CHLORIDE 0.9% FLUSH
10.0000 mL | Freq: Two times a day (BID) | INTRAVENOUS | Status: DC
  Administered 2019-11-24 – 2019-12-07 (×15): 10 mL

## 2019-11-23 MED ORDER — NOREPINEPHRINE 16 MG/250ML-% IV SOLN
0.0000 ug/min | INTRAVENOUS | Status: DC
  Administered 2019-11-23 – 2019-11-24 (×2): 30 ug/min via INTRAVENOUS
  Administered 2019-11-24: 12 ug/min via INTRAVENOUS
  Administered 2019-11-25: 7 ug/min via INTRAVENOUS
  Administered 2019-11-27: 10 ug/min via INTRAVENOUS
  Administered 2019-11-28: 3 ug/min via INTRAVENOUS
  Filled 2019-11-23 (×6): qty 250

## 2019-11-23 MED ORDER — MILRINONE LACTATE IN DEXTROSE 20-5 MG/100ML-% IV SOLN
0.2500 ug/kg/min | INTRAVENOUS | Status: DC
  Administered 2019-11-23 (×2): 0.25 ug/kg/min via INTRAVENOUS
  Administered 2019-11-24 – 2019-11-26 (×3): 0.125 ug/kg/min via INTRAVENOUS
  Filled 2019-11-23 (×5): qty 100

## 2019-11-23 MED ORDER — MILRINONE LACTATE IN DEXTROSE 20-5 MG/100ML-% IV SOLN: 0.2500 ug/kg/min | INTRAVENOUS | Status: DC

## 2019-11-23 MED ORDER — DOPAMINE-DEXTROSE 3.2-5 MG/ML-% IV SOLN
INTRAVENOUS | Status: AC
  Filled 2019-11-23: qty 250

## 2019-11-23 MED ORDER — INSULIN ASPART 100 UNIT/ML ~~LOC~~ SOLN
0.0000 [IU] | SUBCUTANEOUS | Status: DC
  Administered 2019-11-23: 8 [IU] via SUBCUTANEOUS
  Administered 2019-11-23: 4 [IU] via SUBCUTANEOUS
  Administered 2019-11-23 – 2019-11-24 (×3): 12 [IU] via SUBCUTANEOUS

## 2019-11-23 MED ORDER — FUROSEMIDE 10 MG/ML IJ SOLN
40.0000 mg | Freq: Once | INTRAMUSCULAR | Status: AC
  Administered 2019-11-23: 40 mg via INTRAVENOUS
  Filled 2019-11-23: qty 4

## 2019-11-23 MED ORDER — DOPAMINE-DEXTROSE 3.2-5 MG/ML-% IV SOLN
3.0000 ug/kg/min | INTRAVENOUS | Status: DC
  Administered 2019-11-23: 3 ug/kg/min via INTRAVENOUS

## 2019-11-23 MED FILL — Potassium Chloride Inj 2 mEq/ML: INTRAVENOUS | Qty: 40 | Status: AC

## 2019-11-23 MED FILL — Magnesium Sulfate Inj 50%: INTRAMUSCULAR | Qty: 10 | Status: AC

## 2019-11-23 MED FILL — Heparin Sodium (Porcine) Inj 1000 Unit/ML: INTRAMUSCULAR | Qty: 30 | Status: AC

## 2019-11-23 NOTE — Hospital Course (Addendum)
Mr. Lenker is a 86 yo white male with known history of aortic stenosis (moderate to severe in 03/19, peak gradient 42 mm Hg 4; 05/2019 mean gradient 38 mm hg peak 61.5 mm hg ), coronary artery disease (MI 1999, diffusely diseased, patent LAD stent 2019), hypertension, hyperlipidemia, and diabetes mellitus type II who presented to [facility] ED with complaints of chest pain. He has had this pain before (intermittently), it lasts for 15-20 minutes, and is usually relieved with rest;however, the chest pain became worse on 11/15/2019 and he still had it, despite taking 2 Nitroglycerin. He denies nausea, pain radiation, diaphoresis, shortness of breath but does admit to LE edema (feet). Initial EKG showed sinus rhythm, first degree AV block with ST segment depressions in lateral and inferior leads. Initial Troponin I (high sensitivity) was 68 and went up to 4003. He was put on a Heparin drip and later loaded with Plavix.   Hospital Course:   The patient was admitted for further care.  He underwent cardiac catheterization which showed multivessel CAD.  Echocardiogram was obtained and again showed aortic stenosis.  It was felt the patient would benefit from coronary bypass grafting and aortic valve replacement.  Cardiothoracic consultation was requested.  The patient was evaluated by Dr. Servando Snare who felt the patient would not be a candidate for angioplasty.  He was also felt to not benefit from TAVR as this would likely not improve his symptoms.  He was offered surgical intervention, he was given the risks and benefits of the procedure were explained to the patient.  After speaking with his family the patient was willing to proceed.  He was taken to the operating room on 11/22/2019.  He underwent CABG x 4 utilizing LIMA to LAD, SVG to Distal RCA,  and Sequential SVG to Ramus Intermediate and OM 1.  He also underwent Aortic Valve Replacement with a 23 mm Edwards Inspiris Resilia bioprosthetic valve.  He finally underwent  endoscopic harvest of greater saphenous vein from his right leg.  He tolerated the procedure without difficulty and was taken to the SICU in stable condition.  The patient was extubated the evening of surgery.  During his stay in the SICU the patient was weaned off Levophed, Milrinone and Levophed as hemodynamics allowed.  His chest tubes and arterial lines were removed without difficulty.

## 2019-11-23 NOTE — Consult Note (Addendum)
Advanced Heart Failure Team Consult Note   Primary Physician: Cory Munch, PA-C PCP-Cardiologist:  [name], MD  Reason for Consultation: Heart Failure   HPI:    William Walker is seen today for evaluation of post cardiotomy/heart failure at the request of Dr Servando Snare.   William Walker is a 86 y.o.malewith with a hx ofsevere calcific aortic stenosis, multivessel CAD with moderate diffuse disease and patent LAD stent per last LHC 2019, CVD, hypertension, HLD and DM2  Admitted with chest pain/NSTEMI.  HS Trop 68>4003. Take to cath lab and had multivessel coronary disease. ECHO completed and showed EV 40-45% with normal RV moderate hypokinesis on left and entire apical segment. CT surgery consulted for bypass.   Pre Op weight 200 pounds. 11/22/2019 S/P CABG x4 + AVR.   intraop TEE showed EF 50-55% with normal RV. Extubated last night. On HFNC today.   Remains on neo 50 mcg, norepi 10 mcg, dopamine 3 mcg. CO-OX 41% this morning. Started on milrinone 0.25 mcg.  CO-OX 45%. Started on IV lasix today. CVP trending down.      Complaining of pain around chest tube.   Swan #s  CVP 12 PA 36/15 Papi 1.75  SVR 524 CO 6.1 CI 3.4    RHC/LHC 11/16/2019   RA 12 PA 37/19 (26)  PCWP 19 Fick CO/CI 5.5/2.6   Mid LAD to Dist LAD lesion is 20% stenosed.  Ramus lesion is 95% stenosed.  Ost LAD lesion is 99% stenosed.  Ost LM to Mid LM lesion is 75% stenosed.  Prox Cx to Mid Cx lesion is 50% stenosed.  2nd Diag lesion is 90% stenosed.  Prox RCA lesion is 75% stenosed.  Hemodynamic findings consistent with aortic valve stenosis.     Review of Systems: [y] = yes, [ ]  = no   . General: Weight gain [Y ]; Weight loss [ ] ; Anorexia [ ] ; Fatigue [ Y]; Fever [ ] ; Chills [ ] ; Weakness [Y ]  . Cardiac: Chest pain/pressure [ ] ; Resting SOB [ ] ; Exertional SOB [ ] ; Orthopnea [ ] ; Pedal Edema [ ] ; Palpitations [ ] ; Syncope [ ] ; Presyncope [ ] ; Paroxysmal nocturnal dyspnea[ ]    . Pulmonary: Cough [ ] ; Wheezing[ ] ; Hemoptysis[ ] ; Sputum [ ] ; Snoring [ ]   . GI: Vomiting[ ] ; Dysphagia[ ] ; Melena[ ] ; Hematochezia [ ] ; Heartburn[ ] ; Abdominal pain [ ] ; Constipation [ ] ; Diarrhea [ ] ; BRBPR [ ]   . GU: Hematuria[ ] ; Dysuria [ ] ; Nocturia[ ]   . Vascular: Pain in legs with walking [ ] ; Pain in feet with lying flat [ ] ; Non-healing sores [ ] ; Stroke [ ] ; TIA [ ] ; Slurred speech [ ] ;  . Neuro: Headaches[ ] ; Vertigo[ ] ; Seizures[ ] ; Paresthesias[ ] ;Blurred vision [ ] ; Diplopia [ ] ; Vision changes [ ]   . Ortho/Skin: Arthritis [ ] ; Joint pain [ ] ; Muscle pain [ ] ; Joint swelling [ ] ; Back Pain [Y ]; Rash [ ]   . Psych: Depression[ ] ; Anxiety[ ]   . Heme: Bleeding problems [ ] ; Clotting disorders [ ] ; Anemia [ ]   . Endocrine: Diabetes [Y ]; Thyroid dysfunction[Y ]  Home Medications Prior to Admission medications   Medication Sig Start Date End Date Taking? Authorizing Provider  acetaminophen (TYLENOL) 500 MG tablet Take 500 mg by mouth every 6 (six) hours as needed for headache (pain).   Yes [provider]  aspirin EC 81 MG tablet Take 81 mg by mouth daily.   Yes [provider]  Aspirin-Acetaminophen-Caffeine (GOODY HEADACHE PO) Take 1 packet by mouth daily as needed (headache).   Yes [provider]  augmented betamethasone dipropionate (DIPROLENE-AF) 0.05 % cream Apply 1 application topically daily as needed (itching).  01/26/18  Yes [provider]  Calcium Carbonate Antacid (TUMS PO) Take 1 tablet by mouth 2 (two) times daily as needed (acid reflux).   Yes [provider]  furosemide (LASIX) 40 MG tablet Take 40 mg (1 tablet) on Monday, Wednesday & Friday. Patient taking differently: Take 40 mg by mouth See admin instructions. Take one tablet (40 mg) by mouth Monday, Wednesday, Friday mornings 09/16/19  Yes Satira Sark, MD  glipiZIDE (GLUCOTROL) 5 MG tablet Take 5 mg by mouth daily before breakfast.  11/04/18  Yes [provider]  isosorbide mononitrate (IMDUR) 30 MG 24 hr tablet Take 0.5 tablets (15 mg total) by mouth in the morning and at bedtime. 09/13/19 12/12/19 Yes Satira Sark, MD  levothyroxine (SYNTHROID) 50 MCG tablet Take 50 mcg by mouth daily before breakfast.  11/04/18  Yes [provider]  lisinopril (PRINIVIL,ZESTRIL) 10 MG tablet Take 1 tablet (10 mg total) by mouth daily. 11/14/17 11/15/19 Yes Satira Sark, MD  metoprolol succinate (TOPROL XL) 25 MG 24 hr tablet Take 1 tablet (25 mg total) by mouth daily. 09/22/17  Yes Satira Sark, MD  nitroGLYCERIN (NITROSTAT) 0.4 MG SL tablet Place 1 tablet (0.4 mg total) under the tongue every 5 (five) minutes as needed for chest pain. 09/13/19  Yes Satira Sark, MD  potassium chloride SA (KLOR-CON M20) 20 MEQ tablet Take 20 meq (1tablet) on Monday, Wednesday & Friday with lasix. Patient taking differently: Take 20 mEq by mouth See admin instructions. Take one tablet (20 meq) by mouth Monday, Wednesday, Friday mornings (with Lasix/furosemide) 09/16/19  Yes Satira Sark, MD  pravastatin (PRAVACHOL) 40 MG tablet Take 40 mg by mouth at bedtime.    Yes [provider]    Past Medical History: Past Medical History:  Diagnosis Date  . Aortic stenosis   . Coronary atherosclerosis of native coronary artery    Reportedly 2 stents - presumably LAD (records not available)  . CVD (cerebrovascular disease)   . Essential hypertension   . GERD (gastroesophageal reflux disease)   . Hyperlipidemia   . MI (myocardial infarction) [facility])    October 1999  . Type 2 diabetes mellitus with diabetic neuropathy [facility])     Past Surgical History: Past Surgical History:  Procedure Laterality Date  . Arch cerebral ateriogram  12/11/2005   Rosetta Posner MD  . CATARACT EXTRACTION, BILATERAL    . INGUINAL HERNIA REPAIR Left 12/09/2012   Procedure: HERNIA REPAIR INGUINAL with mesh;  Surgeon: [name], MD;  Location: [facility];  Service:  General;  Laterality: Left;  . INSERTION OF MESH Left 12/09/2012   Procedure: INSERTION OF MESH;  Surgeon: [name], MD;  Location: [facility];  Service: General;  Laterality: Left;  . RIGHT/LEFT HEART CATH AND CORONARY ANGIOGRAPHY N/A 10/07/2017   Procedure: RIGHT/LEFT HEART CATH AND CORONARY ANGIOGRAPHY;  Surgeon: [name], MD;  Location: [place] [facility];  Service: Cardiovascular;  Laterality: N/A;  . RIGHT/LEFT HEART CATH AND CORONARY ANGIOGRAPHY N/A 11/16/2019   Procedure: RIGHT/LEFT HEART CATH AND CORONARY ANGIOGRAPHY;  Surgeon: Lorretta Harp, MD;  Location: [place] [facility];  Service: Cardiovascular;  Laterality: N/A;    Family History: Family History  Problem Relation Age of Onset  . Coronary  artery disease Father     Social History: Social History   Socioeconomic History  . Marital status: Married    Spouse name: Not on file  . Number of children: Not on file  . Years of education: Not on file  . Highest education level: Not on file  Occupational History  . Not on file  Tobacco Use  . Smoking status: Former Smoker    Types: Pipe, Cigars    Quit date: 12/03/1948    Years since quitting: 71.0  . Smokeless tobacco: Current User    Types: Chew  . Tobacco comment: has been chewing tobacco for 60-70 years  Vaping Use  . Vaping Use: Never used  Substance and Sexual Activity  . Alcohol use: No  . Drug use: No  . Sexual activity: Not on file  Other Topics Concern  . Not on file  Social History Narrative  . Not on file   Social Determinants of Health   Financial Resource Strain:   . Difficulty of Paying Living Expenses:   Food Insecurity:   . Worried About Charity fundraiser in the Last Year:   . Arboriculturist in the Last Year:   Transportation Needs:   . Film/video editor (Medical):   Marland Kitchen Lack of Transportation (Non-Medical):   Physical Activity:   . Days of Exercise per Week:   . Minutes of Exercise per Session:   Stress:   . Feeling of  Stress :   Social Connections:   . Frequency of Communication with Friends and Family:   . Frequency of Social Gatherings with Friends and Family:   . Attends Religious Services:   . Active Member of Clubs or Organizations:   . Attends Archivist Meetings:   Marland Kitchen Marital Status:     Allergies:  No Active Allergies  Objective:    Vital Signs:   Temp:  [96.1 F (35.6 C)-99.3 F (37.4 C)] 98.6 F (37 C) (06/22 1000) Pulse Rate:  [40-109] 102 (06/22 1000) Resp:  [0-28] 20 (06/22 1000) BP: (78-133)/(48-92) 104/59 (06/22 0900) SpO2:  [89 %-100 %] 92 % (06/22 1000) Arterial Line BP: (86-309)/(35-305) 91/40 (06/22 1000) FiO2 (%):  [40 %-50 %] 40 % (06/21 2225) Weight:  [97 kg] 97 kg (06/22 0640) Last BM Date: 11/21/19  Weight change: Filed Weights   11/21/19 0500 11/22/19 0500 11/23/19 0640  Weight: 93.3 kg 91.1 kg 97 kg    Intake/Output:   Intake/Output Summary (Last 24 hours) at 11/23/2019 1047 Last data filed at 11/23/2019 1000 Gross per 24 hour  Intake 4826.75 ml  Output 3595 ml  Net 1231.75 ml      Physical Exam   CVP ~ 10  General:  No resp difficulty HEENT: normal Neck: supple. JVP 10-11. Carotids 2+ bilat; no bruits. No lymphadenopathy or thyromegaly appreciated. RUIJ swan  Cor: PMI nondisplaced. Regular rate & rhythm. No rubs, gallops or murmurs. Sternal dressing intact. MT tubes.  Lungs: Decreased in the bases on HFNC Abdomen: soft, nontender, nondistended. No hepatosplenomegaly. No bruits or masses. Good bowel sounds. Extremities: no cyanosis, clubbing, rash, R and LLE SCDs. LUE A line.  Neuro: alert & orientedx3, cranial nerves grossly intact. moves all 4 extremities w/o difficulty. Affect flat  Telemetry  Junction Rhythmm 90-100s   EKG   n/a   Labs   Basic Metabolic Panel: Recent Labs  Lab 11/17/19 0325 11/18/19 0250 11/19/19 0248 11/19/19 0248 11/20/19 2993 11/21/19 2038 11/22/19 0047 11/22/19 0047 11/22/19 1652  11/22/19 2212  11/22/19 2247 11/23/19 0000 11/23/19 0211  NA 138   < > 136   < > 137   < > 136   < > 139 137 138 139 137  139  K 3.8   < > 3.6   < > 3.9   < > 3.6   < > 3.8 4.7 4.7 4.9 4.4  4.4  CL 103   < > 104  --  104  --  100  --   --  108  --   --  108  CO2 24   < > 23  --  18*  --  24  --   --  19*  --   --  19*  GLUCOSE 139*   < > 140*  --  178*  --  135*  --   --  138*  --   --  138*  BUN 19   < > 20  --  21  --  25*  --   --  18  --   --  18  CREATININE 1.27*   < > 1.15  --  1.27*  --  1.69*  --   --  1.37*  --   --  1.53*  CALCIUM 8.4*   < > 8.4*   < > 8.9  --  8.6*  --   --  7.3*  --   --  7.3*  MG 2.0  --   --   --   --   --   --   --   --  3.0*  --   --  2.9*   < > = values in this interval not displayed.    Liver Function Tests: Recent Labs  Lab 11/17/19 0325 11/22/19 0047  AST 29 84*  ALT 16 51*  ALKPHOS 63 57  BILITOT 1.2 0.8  PROT 5.9* 6.2*  ALBUMIN 3.2* 3.3*   No results for input(s): LIPASE, AMYLASE in the last 168 hours. No results for input(s): AMMONIA in the last 168 hours.  CBC: Recent Labs  Lab 11/21/19 0607 11/21/19 2038 11/22/19 0047 11/22/19 0047 11/22/19 1300 11/22/19 1300 11/22/19 1652 11/22/19 2212 11/22/19 2247 11/23/19 0000 11/23/19 0211  WBC 8.1  --  7.6  --   --   --  13.4* 9.6  --   --  8.5  HGB 11.3*   < > 11.1*   < > 8.7*   < > 8.2*  8.5* 8.0* 7.5* 7.5* 8.2*  7.8*  HCT 33.8*   < > 33.4*   < > 25.9*   < > 24.0*  25.0* 24.0* 22.0* 22.0* 24.4*  23.0*  MCV 103.7*  --  101.8*  --   --   --  102.5* 103.4*  --   --  103.4*  PLT 206  --  211  --  177  --  133* 138*  --   --  143*   < > = values in this interval not displayed.    Cardiac Enzymes: No results for input(s): CKTOTAL, CKMB, CKMBINDEX, TROPONINI in the last 168 hours.  BNP: BNP (last 3 results) Recent Labs    11/15/19 1947  BNP 606.3*    ProBNP (last 3 results) No results for input(s): PROBNP in the last 8760 hours.   CBG: Recent Labs  Lab 11/23/19 0306  11/23/19 0409 11/23/19 0513 11/23/19 0609 11/23/19 0654  GLUCAP 137* 144* 139* 137* 143*    Coagulation Studies:  Recent Labs    11/22/19 0047 11/22/19 1805  LABPROT 14.4 19.5*  INR 1.2 1.7*     Imaging   DG Chest Port 1 View  Result Date: 11/23/2019 CLINICAL DATA:  Post cardiac surgery EXAM: PORTABLE CHEST 1 VIEW COMPARISON:  11/22/2019 FINDINGS: Endotracheal and enteric tubes are no longer present. Right IJ Swan-Ganz catheter is unchanged. Left chest tubes again noted. No pneumothorax. Small bilateral pleural effusions and bibasilar atelectasis. Similar cardiomediastinal contours. Hiatal hernia. IMPRESSION: Lines and tubes as above.  No pneumothorax. Small bilateral pleural effusions and bibasilar atelectasis. Electronically Signed   By: Macy Mis M.D.   On: 11/23/2019 09:03   DG Chest Port 1 View  Result Date: 11/22/2019 CLINICAL DATA:  Postoperative status. EXAM: PORTABLE CHEST 1 VIEW COMPARISON:  Same day. FINDINGS: Stable cardiomegaly. Endotracheal tube appears to be in good position. Nasogastric tube tip is seen in distal esophagus. Status post coronary bypass graft and aortic valve repair. Right internal jugular Swan-Ganz catheter is noted with tip in expected position of main pulmonary artery. Right lung is clear. Left-sided chest tube is noted without definite pneumothorax. Mild left basilar atelectasis is noted with small pleural effusion. Bony thorax is unremarkable. IMPRESSION: Left-sided chest tube is noted without definite pneumothorax. Mild left basilar atelectasis is noted with small pleural effusion. Nasogastric tube tip is seen in distal esophagus and advancement is recommended. Electronically Signed   By: Marijo Conception M.D.   On: 11/22/2019 17:12      Medications:     Current Medications: . acetaminophen  1,000 mg Oral Q6H   Or  . acetaminophen (TYLENOL) oral liquid 160 mg/5 mL  1,000 mg Per Tube Q6H  . aspirin EC  325 mg Oral Daily   Or  . aspirin   324 mg Per Tube Daily  . bisacodyl  10 mg Oral Daily   Or  . bisacodyl  10 mg Rectal Daily  . Chlorhexidine Gluconate Cloth  6 each Topical Daily  . docusate sodium  200 mg Oral Daily  . insulin aspart  0-24 Units Subcutaneous Q4H  . levothyroxine  50 mcg Oral QAC breakfast  . mouth rinse  15 mL Mouth Rinse BID  . metoprolol tartrate  12.5 mg Oral BID   Or  . metoprolol tartrate  12.5 mg Per Tube BID  . [START ON 11/24/2019] pantoprazole  40 mg Oral Daily  . rosuvastatin  20 mg Oral Daily  . sodium chloride flush  10-40 mL Intracatheter Q12H  . sodium chloride flush  3 mL Intravenous Q12H     Infusions: . sodium chloride 20 mL/hr at 11/23/19 1000  . sodium chloride    . sodium chloride 10 mL/hr at 11/22/19 1737  . albumin human 12.5 g (11/22/19 1932)  . dexmedetomidine (PRECEDEX) IV infusion Stopped (11/22/19 1905)  . DOPamine 3 mcg/kg/min (11/23/19 1000)  . lactated ringers    . lactated ringers 20 mL/hr at 11/23/19 1000  . milrinone 0.25 mcg/kg/min (11/23/19 1000)  . nitroGLYCERIN    . norepinephrine (LEVOPHED) Adult infusion 10 mcg/min (11/23/19 1029)  . phenylephrine (NEO-SYNEPHRINE) Adult infusion 40 mcg/min (11/23/19 1015)  . piperacillin-tazobactam (ZOSYN)  IV Stopped (11/23/19 4010)       Patient Profile   William Walker is a 86 year old with history of severe calcific aortic stenosis, multivessel CAD with moderate diffuse disease and patent LAD stent per last LHC 2019, CKD Stage IIIa, hypertension, HLD and DM2.   S/P CABG x4 AVR  Assessment/Plan   1. Severe Multivessel CAD + critical AS--->11/22/2019 S/P CABG x4 AVR -ECHO EF 50-55% intraoperative.  CO-OX 41% this morning on Neo, Norepi, and Dopamine.  -Hemodynamic with SVR 524, CO 5.5.  - Started on milrinone 0.25 mcg with CO-OX up to 45%. Continue at current dose. Anemia likely effecting CO-OX  - Increase norepi to maintain Maps >65.  -Come down on Neo  - Continue dopamine.   - CVP 9. Had dose of IV lasix.  Will need to watch CVP.   2. Anemia , expected blood loss.  Hgb down after surgery. Watch CBC.   3. AKI  Creatinine trending up 1.3>1.5  Making ~ 20 cc urine   4. DMII On SSI   5. . Hypothyroidism On synthroid 50 mcg.  - Check TSH    Length of Stay: Melbeta, NP  11/23/2019, 10:47 AM  Advanced Heart Failure Team Pager [phone] (M-F; 7a - 4p)  Please contact [facility] for night-coverage after hours (4p -7a ) and weekends on [email or web address]  Agree with above.   86 y/o male with HTN, DM, CAD admitted with NSTEMI found to have severe 3v CAD and severe AS with normal EF.   Underwent CABG/AVR 11/22/19.  Today is POD #1   Remains on neo 50 mcg, norepi 10 mcg, dopamine 3 mcg and milrinone 0.25 mcg.  CO-OX 45%. Remains volume overloaded  Swan #s  CVP 12 PA 36/15 Papi 1.75  SVR 524 CO 6.1 CI 3.4   General:  Elderly male HOH. No resp difficulty HEENT: normal Neck: supple. RIJ swan Carotids 2+ bilat; no bruits. No lymphadenopathy or thryomegaly appreciated. Cor: PMI nondisplaced. Regular rate & rhythm. Sternal dressing ok. +CTs  Lungs: coarse Abdomen: soft, nontender, + distended. No hepatosplenomegaly. No bruits or masses. Hypoactivebowel sounds. Extremities: no cyanosis, clubbing, rash, 1-2+ edema Neuro: alert & orientedx3, cranial nerves grossly intact. moves all 4 extremities w/o difficulty. Affect pleasant  Remains on multiple pressors. Swan #s look good but co-ox remains low. Agree with IV diuresis. Wean neo and dopamine. Titrate NE as need to keep MAP > 65 and Co-ox > 55%. We will follow. Mobilize as early as possible. Aggressive pulmonary toilet. Given advanced age he is at high risk for peri-op complications/debility.   CRITICAL CARE Performed by: Glori Bickers  Total critical care time: 35 minutes  Critical care time was exclusive of separately billable procedures and treating other patients.  Critical care was necessary to treat or prevent imminent  or life-threatening deterioration.  Critical care was time spent personally by me (independent of midlevel providers or residents) on the following activities: development of treatment plan with patient and/or surrogate as well as nursing, discussions with consultants, evaluation of patient's response to treatment, examination of patient, obtaining history from patient or surrogate, ordering and performing treatments and interventions, ordering and review of laboratory studies, ordering and review of radiographic studies, pulse oximetry and re-evaluation of patient's condition.  Glori Bickers, MD  2:47 PM

## 2019-11-23 NOTE — Op Note (Signed)
NAME: William Walker, William Walker MEDICAL RECORD WI:[id number] ACCOUNT [id number] DATE OF BIRTH:March 04, 1927 FACILITY: [facility] LOCATION: [facility] PHYSICIAN:Clema Skousen Maryruth Bun, MD  OPERATIVE REPORT  DATE OF PROCEDURE:  11/22/2019  PREOPERATIVE DIAGNOSIS:  Critical aortic stenosis and severe coronary artery disease with left main obstruction.  POSTOPERATIVE DIAGNOSIS:  Critical aortic stenosis and severe coronary artery disease with left main obstruction.  SURGICAL PROCEDURE: 1.  Aortic valve replacement with Inspiris Resilia tissue aortic valve 23 mm, serial #[id number]. 2.  Coronary artery bypass grafting x4 with the left internal mammary to the left anterior descending coronary artery, sequential reverse saphenous vein graft to the intermediate and first obtuse marginal, reverse saphenous vein graft to the posterior  descending with right thigh greater saphenous endoscopic vein harvest.  SURGEON:  Lanelle Bal, MD  FIRST ASSISTANT:  Enid Cutter, PA.  BRIEF HISTORY:  The patient is a 86 year old male who has a reasonable functional status, most recently limited by known severe aortic stenosis and coronary artery disease.  The patient has been followed medically for aortic stenosis.  On this admission,  he presented with acute exacerbation of chest pain.  Evaluation revealed repeat cardiac catheterization, had significant greater than 80% left main obstruction, proximal LAD obstruction of 80-90%, 70% obstruction of the first obtuse marginal and 90%  obstruction of the intermediate, 75% mid right obstruction.  Overall, ventricular function was approximately 40%.  The patient presented with myocardial infarction, non-STEMI.  CK-MBs went to 4000 during the patient's initial evaluation.  He was loaded  with Plavix.  After full evaluation, it was apparent that the only viable option for treatment included coronary artery bypass grafting and aortic valve replacement.  This was discussed in detail with the  patient and his family.  He was aware that the  risks were increased both because of the nature of his presentation, the degree of his disease and also his age.  The patient was agreeable with proceeding and signed informed consent.  DESCRIPTION OF PROCEDURE:  With Swan-Ganz and arterial line monitors in place, the patient underwent general endotracheal anesthesia without incident.  The skin of the chest and legs were prepped with Betadine, draped in usual sterile manner.  Dr. Oren Bracket placed a TEE probe.  The patient had a known significant hiatal hernia with a portion of his stomach in the chest.  With somewhat limited TEE views, this was adequate to confirm both LV and function of the aortic and mitral valves.  The skin  of the chest and legs were prepped with Betadine and draped in the usual sterile manner.  Appropriate timeout was performed.  We then proceeded with endoscopic harvesting of the right greater saphenous vein in the thigh.  The vein was of good quality and  caliber.  Median sternotomy was performed.  Left internal mammary artery was dissected down as a pedicle graft.  The distal artery was divided and had good free flow.  The vessel was hydrostatically dilated with heparinized saline and papaverine.  The  pericardium was opened.  The patient was systemically heparinized.  The ascending aorta was cannulated.  A retrograde coronary catheter was placed, dual stage venous cannula.  The patient was then placed on cardiopulmonary bypass 2.4 liters per minute  per meter squared.  A right superior pulmonary vein vent was placed.  The patient's body temperature was cooled to 32 degrees.  Sites of anastomosis were selected and dissected out of the epicardium.  The aortic crossclamp was applied.  Six hundred mL of  cold  blood potassium cardioplegia was administered initially antegrade.  The patient did have some degree of aortic insufficiency.  With arrest of the heart, additional cold  blood cardioplegia was administered retrograde.  We then proceeded first with  coronary bypass.  Heart was elevated.  The posterior descending coronary artery was opened.  The distal right coronary artery was significantly calcified.  A 1.5 probe passed distally in the posterior descending.  Using running 7-0 Prolene, distal  anastomosis was performed.  The heart was then elevated and a reasonable size ramus branch was identified, partially intramyocardial.  This vessel was opened, admitted a 1.5 mm probe distally.  Using the diamond, side-to-side anastomosis was carried out  with a segment of reverse saphenous vein graft.  The distal extent of the same vein was then carried a short distance to the first obtuse marginal vessel, which was the primary supplier of the lateral wall.  This vessel was opened.  A 1.5 mm probe passed  easily.  Using a running 7-0 Prolene, distal anastomosis was performed.  We then turned our attention to the left anterior descending coronary artery.  In the distal third of the vessel, the LAD was opened.  Proximal to this, the vessel was severely  calcified.  Using a running 8-0 Prolene, the left internal mammary artery was anastomosed to the left anterior descending coronary artery.  Intermittently, additional cold blood cardioplegia administered down the vein grafts and retrograde.  With the  vessels intact, we then moved to the aortic.  A transverse aortotomy was performed.  This gave good visualization of the highly calcified trileaflet aortic valve.  The valve was excised, the annulus debrided of calcium.  Care was taken to remove all  loose calcific debris.  With the annulus debrided, a 23 mm sizer passed into the annulus.  We then placed #2 Ti-Cron pledgeted sutures in a circumferential manner, a total of 15.  This was used to secure the Inspiris Resilia aortic valve 23 mm, serial  #[id number].  The valve seated nicely.  It did not impinge on the left or right coronary ostium.   It should be noted that the left main ostium had significant cauliflower0shaped calcification in the vessel, partially occluding the lumen.  This was adherent  to the left main wall right at the origin of the left main.  This calcium was debrided.  With all loose calcific debris flushed and removed, we then closed the aortotomy with a horizontal 4-0 Prolene mattress suture over felt strips.  A second layer of  running over-and-over 4-0 Prolene was used.  We then performed 2 punch aortotomies and each of the 2 vein grafts were anastomosed to the ascending aorta.  CO2 had been bathed in the pericardial sac during the procedure.  Prior to completion of the  proximal anastomosis, the heart was allowed to passively fill and further deair.  The aortic crossclamp was then removed with total crossclamp time of 170 minutes.  Prior to removal of the crossclamp, the bulldog on the mammary already been removed and  the patient was also given a retrograde dose of warm cardioplegia.  Sites of anastomosis were inspected and free of bleeding.  Initially, the patient was in complete heart block.  Atrial pacing wires and ventricular pacing wires were applied.  Retrograde  cardioplegic catheter was removed.  Body temperature was rewarmed to 37 degrees.  The patient was then ventilated and filled partially, showing good function of the aortic valve.  Right superior pulmonary vein vent was  then removed.  The patient was  then weaned from cardiopulmonary bypass on low dose dopamine and milrinone.  Initially, he was AV paced, but returned to a sinus rhythm at 90.  He remained stable.  He was decannulated in the usual fashion.  Protamine sulfate was administered.  We spent  some time working on multiple sites of bleeding on the chest wall and sternum.   The anastomosis sites and aortotomy were free of bleeding.  A complete dose of protamine was given with operative field hemostatic.  A left pleural tube and 2 Blake  mediastinal  drains were left in place.  The pericardium was loosely reapproximated over the ascending aorta.  The sternum was then closed with 6 stainless steel wires.  Fascia closed with interrupted 0 Vicryl, running 3-0 Vicryl to subcutaneous tissue,  3-0 subcuticular stitch in skin edges.  Dry dressings were applied.  Sponge and needle count was reported as correct at completion of procedure.  The patient did not require any blood bank blood products during the operative procedure.  Total pump time  was 219 minutes.  VN/NUANCE  D:11/23/2019 T:11/23/2019 JOB:[id number]

## 2019-11-23 NOTE — Addendum Note (Signed)
Addendum  created 11/23/19 0814 by Josephine Igo, CRNA   Order list changed
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
## 2019-11-23 NOTE — Progress Notes (Signed)
      [address]       [place] [address]             [phone]      POD # 1 AVR, CABG  Talking on cell phone  BP (!) 118/52   Pulse (!) 118   Temp 97.7 F (36.5 C)   Resp 18   Ht 5\' 11"  (1.803 m)   Wt 97 kg   SpO2 (!) 89%   BMI 29.83 kg/m  33/17 CO= 5.25,  15L HFNC 92% sat Milrinone 0.25, dopamine @ 3, norepi @ 25  Intake/Output Summary (Last 24 hours) at 11/23/2019 1830 Last data filed at 11/23/2019 1600 Gross per 24 hour  Intake 3304.29 ml  Output 1875 ml  Net 1429.29 ml    PM labs pending  POD # 1 continue current Rx  Torrian Canion C. Roxan Hockey, MD [facility] [phone]

## 2019-11-23 NOTE — Discharge Instructions (Signed)

## 2019-11-24 ENCOUNTER — Inpatient Hospital Stay (HOSPITAL_COMMUNITY): Payer: Medicare Other

## 2019-11-24 LAB — CBC
HCT: 27.6 % — ABNORMAL LOW (ref 39.0–52.0)
Hemoglobin: 9.2 g/dL — ABNORMAL LOW (ref 13.0–17.0)
MCH: 32.9 pg (ref 26.0–34.0)
MCHC: 33.3 g/dL (ref 30.0–36.0)
MCV: 98.6 fL (ref 80.0–100.0)
Platelets: 146 10*3/uL — ABNORMAL LOW (ref 150–400)
RBC: 2.8 MIL/uL — ABNORMAL LOW (ref 4.22–5.81)
RDW: 17.8 % — ABNORMAL HIGH (ref 11.5–15.5)
WBC: 15 10*3/uL — ABNORMAL HIGH (ref 4.0–10.5)
nRBC: 0 % (ref 0.0–0.2)

## 2019-11-24 LAB — COMPREHENSIVE METABOLIC PANEL WITH GFR
ALT: 1131 U/L — ABNORMAL HIGH (ref 0–44)
AST: 1344 U/L — ABNORMAL HIGH (ref 15–41)
Albumin: 3.7 g/dL (ref 3.5–5.0)
Alkaline Phosphatase: 43 U/L (ref 38–126)
Anion gap: 13 (ref 5–15)
BUN: 27 mg/dL — ABNORMAL HIGH (ref 8–23)
CO2: 16 mmol/L — ABNORMAL LOW (ref 22–32)
Calcium: 7.5 mg/dL — ABNORMAL LOW (ref 8.9–10.3)
Chloride: 105 mmol/L (ref 98–111)
Creatinine, Ser: 2.31 mg/dL — ABNORMAL HIGH (ref 0.61–1.24)
GFR calc Af Amer: 28 mL/min — ABNORMAL LOW
GFR calc non Af Amer: 24 mL/min — ABNORMAL LOW
Glucose, Bld: 296 mg/dL — ABNORMAL HIGH (ref 70–99)
Potassium: 4 mmol/L (ref 3.5–5.1)
Sodium: 134 mmol/L — ABNORMAL LOW (ref 135–145)
Total Bilirubin: 1.6 mg/dL — ABNORMAL HIGH (ref 0.3–1.2)
Total Protein: 5.6 g/dL — ABNORMAL LOW (ref 6.5–8.1)

## 2019-11-24 LAB — GLUCOSE, CAPILLARY
Glucose-Capillary: 197 mg/dL — ABNORMAL HIGH (ref 70–99)
Glucose-Capillary: 213 mg/dL — ABNORMAL HIGH (ref 70–99)
Glucose-Capillary: 247 mg/dL — ABNORMAL HIGH (ref 70–99)
Glucose-Capillary: 281 mg/dL — ABNORMAL HIGH (ref 70–99)
Glucose-Capillary: 282 mg/dL — ABNORMAL HIGH (ref 70–99)
Glucose-Capillary: 291 mg/dL — ABNORMAL HIGH (ref 70–99)

## 2019-11-24 LAB — TSH: TSH: 1.349 u[IU]/mL (ref 0.350–4.500)

## 2019-11-24 LAB — COOXEMETRY PANEL
Carboxyhemoglobin: 1 % (ref 0.5–1.5)
Methemoglobin: 1.3 % (ref 0.0–1.5)
O2 Saturation: 60.6 %
Total hemoglobin: 9.4 g/dL — ABNORMAL LOW (ref 12.0–16.0)

## 2019-11-24 LAB — SURGICAL PATHOLOGY

## 2019-11-24 MED ORDER — ENOXAPARIN SODIUM 30 MG/0.3ML ~~LOC~~ SOLN
30.0000 mg | SUBCUTANEOUS | Status: DC
  Administered 2019-11-24 – 2019-12-06 (×13): 30 mg via SUBCUTANEOUS
  Filled 2019-11-24 (×13): qty 0.3

## 2019-11-24 MED ORDER — INSULIN DETEMIR 100 UNIT/ML ~~LOC~~ SOLN
10.0000 [IU] | Freq: Every day | SUBCUTANEOUS | Status: DC
  Administered 2019-11-24: 10 [IU] via SUBCUTANEOUS
  Filled 2019-11-24 (×2): qty 0.1

## 2019-11-24 MED ORDER — FUROSEMIDE 10 MG/ML IJ SOLN
40.0000 mg | Freq: Once | INTRAMUSCULAR | Status: AC
  Administered 2019-11-24: 40 mg via INTRAVENOUS
  Filled 2019-11-24: qty 4

## 2019-11-24 MED ORDER — MORPHINE SULFATE (PF) 2 MG/ML IV SOLN: 1.0000 mg | INTRAVENOUS | Status: DC | PRN

## 2019-11-24 MED ORDER — INSULIN ASPART 100 UNIT/ML ~~LOC~~ SOLN
0.0000 [IU] | Freq: Three times a day (TID) | SUBCUTANEOUS | Status: DC
  Administered 2019-11-24: 12 [IU] via SUBCUTANEOUS
  Administered 2019-11-24: 4 [IU] via SUBCUTANEOUS
  Administered 2019-11-24: 8 [IU] via SUBCUTANEOUS
  Administered 2019-11-25 (×2): 2 [IU] via SUBCUTANEOUS
  Administered 2019-11-25: 4 [IU] via SUBCUTANEOUS
  Administered 2019-11-25 – 2019-11-26 (×2): 8 [IU] via SUBCUTANEOUS
  Administered 2019-11-26 – 2019-11-27 (×4): 2 [IU] via SUBCUTANEOUS
  Administered 2019-11-27 – 2019-11-28 (×2): 4 [IU] via SUBCUTANEOUS
  Administered 2019-11-28: 2 [IU] via SUBCUTANEOUS
  Administered 2019-11-28 (×2): 4 [IU] via SUBCUTANEOUS
  Administered 2019-11-29 (×3): 2 [IU] via SUBCUTANEOUS
  Administered 2019-11-29 – 2019-11-30 (×3): 4 [IU] via SUBCUTANEOUS
  Administered 2019-11-30: 8 [IU] via SUBCUTANEOUS
  Administered 2019-12-01: 4 [IU] via SUBCUTANEOUS
  Administered 2019-12-01 – 2019-12-02 (×3): 2 [IU] via SUBCUTANEOUS
  Administered 2019-12-02 (×2): 4 [IU] via SUBCUTANEOUS
  Administered 2019-12-02: 8 [IU] via SUBCUTANEOUS
  Administered 2019-12-03: 2 [IU] via SUBCUTANEOUS
  Administered 2019-12-03: 16 [IU] via SUBCUTANEOUS
  Administered 2019-12-04: 2 [IU] via SUBCUTANEOUS
  Administered 2019-12-04: 4 [IU] via SUBCUTANEOUS
  Administered 2019-12-04: 2 [IU] via SUBCUTANEOUS
  Administered 2019-12-05: 8 [IU] via SUBCUTANEOUS
  Administered 2019-12-05 (×2): 2 [IU] via SUBCUTANEOUS
  Administered 2019-12-06: 8 [IU] via SUBCUTANEOUS
  Administered 2019-12-06 (×2): 2 [IU] via SUBCUTANEOUS
  Administered 2019-12-07: 16 [IU] via SUBCUTANEOUS

## 2019-11-24 MED ORDER — FUROSEMIDE 10 MG/ML IJ SOLN
80.0000 mg | Freq: Two times a day (BID) | INTRAMUSCULAR | Status: DC
  Administered 2019-11-24 – 2019-11-25 (×3): 80 mg via INTRAVENOUS
  Filled 2019-11-24 (×2): qty 8

## 2019-11-24 MED ORDER — DOPAMINE-DEXTROSE 3.2-5 MG/ML-% IV SOLN
2.5000 ug/kg/min | INTRAVENOUS | Status: DC
  Administered 2019-11-24: 2.5 ug/kg/min via INTRAVENOUS
  Filled 2019-11-24: qty 250

## 2019-11-24 MED ORDER — OXYCODONE HCL 5 MG PO TABS
5.0000 mg | ORAL_TABLET | ORAL | Status: DC | PRN
  Administered 2019-11-24 (×2): 5 mg via ORAL
  Filled 2019-11-24 (×2): qty 1

## 2019-11-24 MED ORDER — TRAMADOL HCL 50 MG PO TABS
50.0000 mg | ORAL_TABLET | Freq: Four times a day (QID) | ORAL | Status: DC | PRN
  Administered 2019-11-24 – 2019-12-05 (×4): 50 mg via ORAL
  Filled 2019-11-24 (×4): qty 1

## 2019-11-24 MED FILL — Mannitol IV Soln 20%: INTRAVENOUS | Qty: 500 | Status: AC

## 2019-11-24 MED FILL — Albumin, Human Inj 5%: INTRAVENOUS | Qty: 500 | Status: AC

## 2019-11-24 MED FILL — Heparin Sodium (Porcine) Inj 1000 Unit/ML: INTRAMUSCULAR | Qty: 50 | Status: AC

## 2019-11-24 MED FILL — Sodium Chloride IV Soln 0.9%: INTRAVENOUS | Qty: 3000 | Status: AC

## 2019-11-24 MED FILL — Electrolyte-R (PH 7.4) Solution: INTRAVENOUS | Qty: 4000 | Status: AC

## 2019-11-24 MED FILL — Sodium Bicarbonate IV Soln 8.4%: INTRAVENOUS | Qty: 50 | Status: AC

## 2019-11-24 MED FILL — Lidocaine HCl Local Soln Prefilled Syringe 100 MG/5ML (2%): INTRAMUSCULAR | Qty: 5 | Status: AC

## 2019-11-24 NOTE — Progress Notes (Signed)
EVENING ROUNDS NOTE :     [address]       [place] [address]             [phone]                 2 Days Post-Op Procedure(s) (LRB): CORONARY ARTERY BYPASS GRAFTING (CABG), ON PUMP, TIMES FOUR , USING LEFT INTERNAL MAMMARY ARTERY AND ENDOSCOPICALLY HARVESTED RIGHT GREATER SAPHENOUS VEIN (N/A) AORTIC VALVE REPLACEMENT (AVR) USING INSPIRIS 23MM (N/A) TRANSESOPHAGEAL ECHOCARDIOGRAM (TEE) (N/A)   Total Length of Stay:  LOS: 9 days  Events: No events today. Resting comfortably.    BP (!) 116/52   Pulse (!) 101   Temp 97.8 F (36.6 C)   Resp (!) 8   Ht 5\' 11"  (1.803 m)   Wt 102.6 kg   SpO2 96%   BMI 31.55 kg/m   PAP: (27-44)/(13-27) 38/18 CVP:  [10 mmHg-17 mmHg] 16 mmHg CO:  [5.9 L/min-6.6 L/min] 6.6 L/min CI:  [3.2 L/min/m2-3.6 L/min/m2] 3.6 L/min/m2     . sodium chloride Stopped (11/23/19 2326)  . sodium chloride    . sodium chloride Stopped (11/24/19 1458)  . DOPamine 2.5 mcg/kg/min (11/24/19 1700)  . lactated ringers    . lactated ringers Stopped (11/24/19 1103)  . milrinone 0.125 mcg/kg/min (11/24/19 1700)  . nitroGLYCERIN    . norepinephrine (LEVOPHED) Adult infusion 14 mcg/min (11/24/19 1700)  . phenylephrine (NEO-SYNEPHRINE) Adult infusion Stopped (11/23/19 2026)  . piperacillin-tazobactam (ZOSYN)  IV 12.5 mL/hr at 11/24/19 1700    I/O last 3 completed shifts: In: 4721.5 [P.O.:200; I.V.:3992.9; IV Piggyback:528.6] Out: 2435 [Urine:1765; Chest Tube:670]   CBC Latest Ref Rng & Units 11/24/2019 11/23/2019 11/23/2019  WBC 4.0 - 10.5 K/uL 15.0(H) 13.6(H) 8.5  Hemoglobin 13.0 - 17.0 g/dL 9.2(L) 8.8(L) 8.2(L)  Hematocrit 39 - 52 % 27.6(L) 26.5(L) 24.4(L)  Platelets 150 - 400 K/uL 146(L) 140(L) 143(L)    BMP Latest Ref Rng & Units 11/24/2019 11/23/2019 11/23/2019  Glucose 70 - 99 mg/dL 296(H) 303(H) 138(H)  BUN 8 - 23 mg/dL 27(H) 25(H) 18  Creatinine 0.61 - 1.24 mg/dL 2.31(H) 2.13(H) 1.53(H)  BUN/Creat Ratio - - - -  Sodium 135 - 145  mmol/L 134(L) 131(L) 137  Potassium 3.5 - 5.1 mmol/L 4.0 4.5 4.4  Chloride 98 - 111 mmol/L 105 103 108  CO2 22 - 32 mmol/L 16(L) 13(L) 19(L)  Calcium 8.9 - 10.3 mg/dL 7.5(L) 7.1(L) 7.3(L)    ABG    Component Value Date/Time   PHART 7.323 (L) 11/23/2019 0211   PCO2ART 35.5 11/23/2019 0211   PO2ART 78 (L) 11/23/2019 0211   HCO3 18.4 (L) 11/23/2019 0211   TCO2 19 (L) 11/23/2019 0211   ACIDBASEDEF 7.0 (H) 11/23/2019 0211   O2SAT 60.6 11/24/2019 0437       Melodie Bouillon, MD 11/24/2019 7:00 PM

## 2019-11-24 NOTE — Progress Notes (Addendum)
Patient ID: William Walker, male   DOB: 01-22-1927, 86 y.o.   MRN: [id number] TCTS DAILY ICU PROGRESS NOTE                   [address]            [place] [address]          [phone]   2 Days Post-Op Procedure(s) (LRB): CORONARY ARTERY BYPASS GRAFTING (CABG), ON PUMP, TIMES FOUR , USING LEFT INTERNAL MAMMARY ARTERY AND ENDOSCOPICALLY HARVESTED RIGHT GREATER SAPHENOUS VEIN (N/A) AORTIC VALVE REPLACEMENT (AVR) USING INSPIRIS 23MM (N/A) TRANSESOPHAGEAL ECHOCARDIOGRAM (TEE) (N/A)  Total Length of Stay:  LOS: 9 days   Subjective: Awake and alert , neuro intact  Objective: Vital signs in last 24 hours: Temp:  [97.3 F (36.3 C)-99.1 F (37.3 C)] 97.5 F (36.4 C) (06/23 0600) Pulse Rate:  [83-127] 121 (06/23 0600) Cardiac Rhythm: Junctional rhythm (06/23 0400) Resp:  [9-29] 13 (06/23 0600) BP: (104-128)/(52-68) 128/68 (06/22 2033) SpO2:  [65 %-97 %] 95 % (06/23 0600) Arterial Line BP: (64-290)/(33-284) 96/70 (06/23 0600) Weight:  [102.6 kg] 102.6 kg (06/23 0500)  Filed Weights   11/22/19 0500 11/23/19 0640 11/24/19 0500  Weight: 91.1 kg 97 kg 102.6 kg    Weight change: 5.6 kg   Hemodynamic parameters for last 24 hours: PAP: (27-44)/(11-27) 35/20 CVP:  [10 mmHg-20 mmHg] 16 mmHg CO:  [4.1 L/min-6.6 L/min] 6.6 L/min CI:  [2.3 L/min/m2-3.6 L/min/m2] 3.6 L/min/m2  Intake/Output from previous day: 06/22 0701 - 06/23 0700 In: 2837.5 [P.O.:50; I.V.:2642.9; IV Piggyback:144.6] Out: 1355 [Urine:1085; Chest Tube:270]  Intake/Output this shift: No intake/output data recorded.  Current Meds: Scheduled Meds: . acetaminophen  1,000 mg Oral Q6H   Or  . acetaminophen (TYLENOL) oral liquid 160 mg/5 mL  1,000 mg Per Tube Q6H  . aspirin EC  325 mg Oral Daily   Or  . aspirin  324 mg Per Tube Daily  . bisacodyl  10 mg Oral Daily   Or  . bisacodyl  10 mg Rectal Daily  . Chlorhexidine Gluconate Cloth  6 each Topical Daily  . docusate sodium  200 mg Oral Daily  .  insulin aspart  0-24 Units Subcutaneous Q4H  . levothyroxine  50 mcg Oral QAC breakfast  . mouth rinse  15 mL Mouth Rinse BID  . metoprolol tartrate  12.5 mg Oral BID   Or  . metoprolol tartrate  12.5 mg Per Tube BID  . pantoprazole  40 mg Oral Daily  . rosuvastatin  20 mg Oral Daily  . sodium chloride flush  10-40 mL Intracatheter Q12H  . sodium chloride flush  3 mL Intravenous Q12H   Continuous Infusions: . sodium chloride Stopped (11/23/19 2326)  . sodium chloride    . sodium chloride 10 mL/hr at 11/23/19 1500  . dexmedetomidine (PRECEDEX) IV infusion Stopped (11/22/19 1905)  . DOPamine 3 mcg/kg/min (11/24/19 0600)  . lactated ringers    . lactated ringers 20 mL/hr at 11/24/19 0600  . milrinone 0.25 mcg/kg/min (11/24/19 0600)  . nitroGLYCERIN    . norepinephrine (LEVOPHED) Adult infusion 30 mcg/min (11/24/19 0600)  . phenylephrine (NEO-SYNEPHRINE) Adult infusion Stopped (11/23/19 2026)  . piperacillin-tazobactam (ZOSYN)  IV 12.5 mL/hr at 11/24/19 0600   PRN Meds:.sodium chloride, dextrose, metoprolol tartrate, midazolam, morphine injection, ondansetron (ZOFRAN) IV, oxyCODONE, sodium chloride flush, sodium chloride flush, traMADol  General appearance: alert, cooperative, appears stated age and no distress Neurologic: intact Heart: friction rub heard left chest  Lungs: diminished breath sounds bibasilar Abdomen: soft, non-tender; bowel sounds normal; no masses,  no organomegaly Extremities: extremities normal, atraumatic, no cyanosis or edema and Homans sign is negative, no sign of DVT Wound: sternum stable   Lab Results: CBC: Recent Labs    11/23/19 1956 11/24/19 0430  WBC 13.6* 15.0*  HGB 8.8* 9.2*  HCT 26.5* 27.6*  PLT 140* 146*   BMET:  Recent Labs    11/23/19 1956 11/24/19 0430  NA 131* 134*  K 4.5 4.0  CL 103 105  CO2 13* 16*  GLUCOSE 303* 296*  BUN 25* 27*  CREATININE 2.13* 2.31*  CALCIUM 7.1* 7.5*    CMET: Lab Results  Component Value Date   WBC  15.0 (H) 11/24/2019   HGB 9.2 (L) 11/24/2019   HCT 27.6 (L) 11/24/2019   PLT 146 (L) 11/24/2019   GLUCOSE 296 (H) 11/24/2019   CHOL 166 11/16/2019   TRIG 88 11/16/2019   HDL 47 11/16/2019   LDLCALC 101 (H) 11/16/2019   ALT 1,131 (H) 11/24/2019   AST 1,344 (H) 11/24/2019   NA 134 (L) 11/24/2019   K 4.0 11/24/2019   CL 105 11/24/2019   CREATININE 2.31 (H) 11/24/2019   BUN 27 (H) 11/24/2019   CO2 16 (L) 11/24/2019   TSH 1.349 11/24/2019   INR 1.7 (H) 11/22/2019   HGBA1C 7.4 (H) 11/15/2019      PT/INR:  Recent Labs    11/22/19 1805  LABPROT 19.5*  INR 1.7*   Radiology: No results found.   Assessment/Plan: S/P Procedure(s) (LRB): CORONARY ARTERY BYPASS GRAFTING (CABG), ON PUMP, TIMES FOUR , USING LEFT INTERNAL MAMMARY ARTERY AND ENDOSCOPICALLY HARVESTED RIGHT GREATER SAPHENOUS VEIN (N/A) AORTIC VALVE REPLACEMENT (AVR) USING INSPIRIS 23MM (N/A) TRANSESOPHAGEAL ECHOCARDIOGRAM (TEE) (N/A) Mobilize Diuresis d/c tubes/lines Continue foley due to urinary output monitoring Acute on chronic renal insufficiency , base line preop 1.6  Elevation of AST and ALT today - monitor  COOX this am improved 60 D/c mt  Out of bed to / pt to see today  Decrease dopamine as driver of increased HR Adjust insulin dose ac and hs add levimir    [name] 11/24/2019 7:09 AM

## 2019-11-24 NOTE — Progress Notes (Addendum)
Advanced Heart Failure Rounding Note  PCP-Cardiologist: [name], MD  AHF: Dr. Haroldine Laws     Patient Profile   86 y/o male with HTN, DM, CAD admitted with NSTEMI found to have severe 3v CAD and severe AS with normal EF.   Underwent CABG/AVR 11/22/19.  AHF consulted for post-cardiotomy shock.   Subjective:    Off Neo. Remains on Milrinone 0.25, NE 30 + Dopamine 3.   Co-ox better today at 61%. MAP in the 70s-80s  Scr worse, 1.53>>2.13>>2.31. Only 1L in UOP yesterday w/ IV Lasix.    WBC trending up, 8.5>>13.6>>15.0. AF. Remains on Zosyn.   Swan #s  CVP 16 PA 35/22 (28) CO 6.55 CI 3.60  Awake and alert. No distress.   Objective:   Weight Range: 102.6 kg Body mass index is 31.55 kg/m.   Vital Signs:   Temp:  [97.3 F (36.3 C)-99.1 F (37.3 C)] 97.5 F (36.4 C) (06/23 0600) Pulse Rate:  [83-127] 121 (06/23 0600) Resp:  [9-29] 13 (06/23 0600) BP: (104-128)/(52-68) 128/68 (06/22 2033) SpO2:  [65 %-97 %] 95 % (06/23 0600) Arterial Line BP: (64-290)/(33-284) 96/70 (06/23 0600) Weight:  [102.6 kg] 102.6 kg (06/23 0500) Last BM Date: 11/21/19  Weight change: Filed Weights   11/22/19 0500 11/23/19 0640 11/24/19 0500  Weight: 91.1 kg 97 kg 102.6 kg    Intake/Output:   Intake/Output Summary (Last 24 hours) at 11/24/2019 0715 Last data filed at 11/24/2019 0600 Gross per 24 hour  Intake 2837.48 ml  Output 1355 ml  Net 1482.48 ml      Physical Exam    CVP 15-16  General:  Elderly WM on multiple drips in ICU. Awake and alert. No resp difficulty HEENT: Normal Neck: Supple. + Rt IJ Swan. Unable to assess JVP due to swan ganz cath bandaging . Carotids 2+ bilat; no bruits. No lymphadenopathy or thyromegaly appreciated. Cor: PMI nondisplaced. Regular rhythm, tachy rate. No rubs, gallops or murmurs. Sternotomy incision bandaged + Chest Tubes  Lungs: Clear Abdomen: Soft, nontender, nondistended. No hepatosplenomegaly. No bruits or masses. Good bowel  sounds. Extremities: No cyanosis, clubbing, rash, trace bilateral edema, bilateral SCDs GU: + Foley  Neuro: Alert & orientedx3, cranial nerves grossly intact. moves all 4 extremities w/o difficulty. Affect pleasant   Telemetry   Sinus tach 120s   EKG    No new EKG to review   Labs    CBC Recent Labs    11/23/19 1956 11/24/19 0430  WBC 13.6* 15.0*  HGB 8.8* 9.2*  HCT 26.5* 27.6*  MCV 100.0 98.6  PLT 140* 505*   Basic Metabolic Panel Recent Labs    11/23/19 0211 11/23/19 0211 11/23/19 1956 11/24/19 0430  NA 137  139   < > 131* 134*  K 4.4  4.4   < > 4.5 4.0  CL 108   < > 103 105  CO2 19*   < > 13* 16*  GLUCOSE 138*   < > 303* 296*  BUN 18   < > 25* 27*  CREATININE 1.53*   < > 2.13* 2.31*  CALCIUM 7.3*   < > 7.1* 7.5*  MG 2.9*  --  2.7*  --    < > = values in this interval not displayed.   Liver Function Tests Recent Labs    11/22/19 0047 11/24/19 0430  AST 84* 1,344*  ALT 51* 1,131*  ALKPHOS 57 43  BILITOT 0.8 1.6*  PROT 6.2* 5.6*  ALBUMIN 3.3* 3.7   No  results for input(s): LIPASE, AMYLASE in the last 72 hours. Cardiac Enzymes No results for input(s): CKTOTAL, CKMB, CKMBINDEX, TROPONINI in the last 72 hours.  BNP: BNP (last 3 results) Recent Labs    11/15/19 1947  BNP 606.3*    ProBNP (last 3 results) No results for input(s): PROBNP in the last 8760 hours.   D-Dimer No results for input(s): DDIMER in the last 72 hours. Hemoglobin A1C No results for input(s): HGBA1C in the last 72 hours. Fasting Lipid Panel No results for input(s): CHOL, HDL, LDLCALC, TRIG, CHOLHDL, LDLDIRECT in the last 72 hours. Thyroid Function Tests Recent Labs    11/24/19 0430  TSH 1.349    Other results:   Imaging     No results found.   Medications:     Scheduled Medications: . acetaminophen  1,000 mg Oral Q6H   Or  . acetaminophen (TYLENOL) oral liquid 160 mg/5 mL  1,000 mg Per Tube Q6H  . aspirin EC  325 mg Oral Daily   Or  . aspirin   324 mg Per Tube Daily  . bisacodyl  10 mg Oral Daily   Or  . bisacodyl  10 mg Rectal Daily  . Chlorhexidine Gluconate Cloth  6 each Topical Daily  . docusate sodium  200 mg Oral Daily  . insulin aspart  0-24 Units Subcutaneous Q4H  . levothyroxine  50 mcg Oral QAC breakfast  . mouth rinse  15 mL Mouth Rinse BID  . metoprolol tartrate  12.5 mg Oral BID   Or  . metoprolol tartrate  12.5 mg Per Tube BID  . pantoprazole  40 mg Oral Daily  . rosuvastatin  20 mg Oral Daily  . sodium chloride flush  10-40 mL Intracatheter Q12H  . sodium chloride flush  3 mL Intravenous Q12H     Infusions: . sodium chloride Stopped (11/23/19 2326)  . sodium chloride    . sodium chloride 10 mL/hr at 11/23/19 1500  . dexmedetomidine (PRECEDEX) IV infusion Stopped (11/22/19 1905)  . DOPamine 3 mcg/kg/min (11/24/19 0600)  . lactated ringers    . lactated ringers 20 mL/hr at 11/24/19 0600  . milrinone 0.25 mcg/kg/min (11/24/19 0600)  . nitroGLYCERIN    . norepinephrine (LEVOPHED) Adult infusion 30 mcg/min (11/24/19 0600)  . phenylephrine (NEO-SYNEPHRINE) Adult infusion Stopped (11/23/19 2026)  . piperacillin-tazobactam (ZOSYN)  IV 12.5 mL/hr at 11/24/19 0600     PRN Medications:  sodium chloride, dextrose, metoprolol tartrate, midazolam, morphine injection, ondansetron (ZOFRAN) IV, oxyCODONE, sodium chloride flush, sodium chloride flush, traMADol     Assessment/Plan   1. Severe Multivessel CAD + critical AS - 11/22/2019 S/P CABG x 4 + AVR - post op management per CT surgery  - continue ASA + statin - metoprolol tartrate 12.5 bid ordered. Continuing holding for post-cardiotomy shock   2. Post Cardiotomy Shock  - ECHO EF 50-55% intraoperative.  - Initial CO-OX post op 41% on Neo, Norepi, and Dopamine. Milrinone added.  - now off Neo. Remains on Milrinone 0.25, NE 30 + Dopamine 3. - Co-ox improved, 61% today  - Continue gradual wean of pressors to keep MAP >65 and Co-ox >55 - CVP 16. Continue  IV Lasix for diuresis   3. Anemia , expected blood loss.  - Hgb down after surgery, now trending up, 9.2 today  - continue to monitor   3. AKI  - Creatinine trending up 1.3>1.5>2.1>2.3 - Making urine. -1L out yesterday  - Continue pressor support. Keep MAPs >65 - Continue to monitor  4. DMII - On SSI   5. Hypothyroidism - On synthroid 50 mcg.  - TSH normal     Length of Stay: [address], PA-C  11/24/2019, 7:15 AM  Advanced Heart Failure Team Pager [phone] (M-F; [place])  Please contact [facility] for night-coverage after hours (4p -7a ) and weekends on [email or web address]   Agree with above. Co-ox improved but creatinine worse. Off Neo. On NE 30, mirlinone and DA. TCTS weaning DA. Volume status up  Feels weak  General:  Weak appearing. HOH. No resp difficulty HEENT: normal Neck: supple. no JVD. Carotids 2+ bilat; no bruits. No lymphadenopathy or thryomegaly appreciated. Cor: PMI nondisplaced. Tachy regular +rub Sternal dressing ok  Lungs: clear Abdomen: soft, nontender, nondistended. No hepatosplenomegaly. No bruits or masses. Good bowel sounds. Extremities: no cyanosis, clubbing, rash, 1+ edema + SCDs Neuro: alert & orientedx3, cranial nerves grossly intact. moves all 4 extremities w/o difficulty. Affect pleasant  Remains tenuous. Volume overloaded and tachycardic. Creatinine rising.   Would wean off DA. Increase lasix to 80 IV bid. Hold b-blocker for now.   CRITICAL CARE Performed by: Glori Bickers  Total critical care time: 35 minutes  Critical care time was exclusive of separately billable procedures and treating other patients.  Critical care was necessary to treat or prevent imminent or life-threatening deterioration.  Critical care was time spent personally by me (independent of midlevel providers or residents) on the following activities: development of treatment plan with patient and/or surrogate as well as nursing, discussions with  consultants, evaluation of patient's response to treatment, examination of patient, obtaining history from patient or surrogate, ordering and performing treatments and interventions, ordering and review of laboratory studies, ordering and review of radiographic studies, pulse oximetry and re-evaluation of patient's condition.  Glori Bickers, MD  10:56 AM

## 2019-11-24 NOTE — Evaluation (Signed)
Physical Therapy Evaluation Patient Details Name: William Walker MRN: [id number] DOB: 05/11/1927 Today's Date: 11/24/2019   History of Present Illness  86 yo admitted with chest pain with NSTEMI with severe AS and CAD s/p CABG/AVR 6/21. PMHx:HTN, DM, CAD  Clinical Impression  Pt pleasant HOH with Right ear better than left. Pt lives at home with wife of 81 years, uses a walker and cares for himself. Pt educated for sternal precautions but unable to recall within session. Pt with decreased strength, balance, transfers, functional mobility and gait who will benefit from acute therapy to maximize mobility, safety and independence to decrease burden of care. Pt reports daughter can also assist but she works.   HR 109-120 SpO2 88-96% on 15L HFNC 101/44 (62) before activity 116/59 (74) after    Follow Up Recommendations CIR    Equipment Recommendations  Other (comment) (TBD)    Recommendations for Other Services OT consult;Rehab consult     Precautions / Restrictions Precautions Precautions: Fall;Sternal      Mobility  Bed Mobility Overal bed mobility: Needs Assistance Bed Mobility: Rolling;Sidelying to Sit Rolling: Max assist Sidelying to sit: Max assist;HOB elevated       General bed mobility comments: max assist to roll to right with cues for sequence and assist to bend knee and rotate pelvis. Physical assist to bring legs off of bed and elevate trunk with increased time and max multimodal cues  Transfers Overall transfer level: Needs assistance   Transfers: Sit to/from Stand;Stand Pivot Transfers Sit to Stand: Mod assist;From elevated surface Stand pivot transfers: Mod assist;+2 safety/equipment       General transfer comment: max cues for sequence with assist for anterior translation and assist to rise from surface. Pivot to right with RW from bed to recliner with max multimodal cues and physical assist to advance pelvis as pt unable to hear commands and was attempting  to turn in the opposite direction. Small slow steps to pivot with increased time to initiate hip flexion to sit in chair  Ambulation/Gait                Stairs            Wheelchair Mobility    Modified Rankin (Stroke Patients Only)       Balance Overall balance assessment: Needs assistance   Sitting balance-Leahy Scale: Fair Sitting balance - Comments: EOB with guarding     Standing balance-Leahy Scale: Poor Standing balance comment: bil UE support on RW in standing                             Pertinent Vitals/Pain Pain Assessment: No/denies pain    Home Living Family/patient expects to be discharged to:: Private residence Living Arrangements: Spouse/significant other Available Help at Discharge: Family;Available 24 hours/day Type of Home: House Home Access: Stairs to enter Entrance Stairs-Rails: Psychiatric nurse of Steps: 6 Home Layout: Two level;Able to live on main level with bedroom/bathroom Home Equipment: Gilford Rile - 2 wheels;Bedside commode      Prior Function Level of Independence: Independent with assistive device(s)               Hand Dominance        Extremity/Trunk Assessment   Upper Extremity Assessment Upper Extremity Assessment: Generalized weakness    Lower Extremity Assessment Lower Extremity Assessment: Generalized weakness    Cervical / Trunk Assessment Cervical / Trunk Assessment: Normal  Communication   Communication: HOH (  hears better in right ear)  Cognition Arousal/Alertness: Awake/alert Behavior During Therapy: Flat affect Overall Cognitive Status: Difficult to assess                                 General Comments: pt oriented to place and month not day. Pt HOH and does not always hear questions to respond. Slow processing with mobility cues      General Comments      Exercises General Exercises - Lower Extremity Long Arc Quad: AROM;Both;Seated;10 reps Hip  Flexion/Marching: AROM;Both;Seated;10 reps   Assessment/Plan    PT Assessment Patient needs continued PT services  PT Problem List Decreased strength;Decreased mobility;Decreased safety awareness;Decreased range of motion;Decreased activity tolerance;Decreased cognition;Decreased balance;Decreased knowledge of use of DME;Decreased knowledge of precautions       PT Treatment Interventions DME instruction;Therapeutic exercise;Gait training;Balance training;Functional mobility training;Stair training;Cognitive remediation;Therapeutic activities;Patient/family education;Neuromuscular re-education    PT Goals (Current goals can be found in the Care Plan section)  Acute Rehab PT Goals Patient Stated Goal: return home and fix things PT Goal Formulation: With patient Time For Goal Achievement: 12/08/19 Potential to Achieve Goals: Fair    Frequency Min 3X/week   Barriers to discharge Decreased caregiver support      Co-evaluation               AM-PAC PT "6 Clicks" Mobility  Outcome Measure Help needed turning from your back to your side while in a flat bed without using bedrails?: A Lot Help needed moving from lying on your back to sitting on the side of a flat bed without using bedrails?: Total Help needed moving to and from a bed to a chair (including a wheelchair)?: A Lot Help needed standing up from a chair using your arms (e.g., wheelchair or bedside chair)?: A Lot Help needed to walk in hospital room?: Total Help needed climbing 3-5 steps with a railing? : Total 6 Click Score: 9    End of Session Equipment Utilized During Treatment: Gait belt Activity Tolerance: Patient tolerated treatment well Patient left: in chair;with call bell/phone within reach;with chair alarm set;with nursing/sitter in room Nurse Communication: Mobility status;Precautions PT Visit Diagnosis: Other abnormalities of gait and mobility (R26.89);Difficulty in walking, not elsewhere classified  (R26.2);Muscle weakness (generalized) (M62.81)    Time: 3382-5053 PT Time Calculation (min) (ACUTE ONLY): 28 min   Charges:   PT Evaluation $PT Eval Moderate Complexity: 1 Mod PT Treatments $Therapeutic Activity: 8-22 mins        Joseth Weigel P, PT Acute Rehabilitation Services Pager: [phone] Office: [phone]   Sandy Salaam Sharonne Ricketts 11/24/2019, 12:48 PM

## 2019-11-24 NOTE — Progress Notes (Signed)
Inpatient Rehab Admissions Coordinator Note:   Per PT recommendations, pt was screened for CIR candidacy by Shann Medal, PT, DPT.  At this time we are recommending a CIR consult.  Please place an IP Rehab MD consult order if pt would like to be considered.   Shann Medal, PT, DPT [phone] 11/24/19 1:38 PM

## 2019-11-25 ENCOUNTER — Inpatient Hospital Stay: Payer: Self-pay

## 2019-11-25 ENCOUNTER — Inpatient Hospital Stay (HOSPITAL_COMMUNITY): Payer: Medicare Other

## 2019-11-25 DIAGNOSIS — R570 Cardiogenic shock: Secondary | ICD-10-CM

## 2019-11-25 LAB — TYPE AND SCREEN
ABO/RH(D): A POS
Antibody Screen: NEGATIVE
Unit division: 0
Unit division: 0
Unit division: 0
Unit division: 0

## 2019-11-25 LAB — COMPREHENSIVE METABOLIC PANEL
ALT: 1068 U/L — ABNORMAL HIGH (ref 0–44)
AST: 599 U/L — ABNORMAL HIGH (ref 15–41)
Albumin: 3.5 g/dL (ref 3.5–5.0)
Alkaline Phosphatase: 53 U/L (ref 38–126)
Anion gap: 13 (ref 5–15)
BUN: 32 mg/dL — ABNORMAL HIGH (ref 8–23)
CO2: 18 mmol/L — ABNORMAL LOW (ref 22–32)
Calcium: 7.8 mg/dL — ABNORMAL LOW (ref 8.9–10.3)
Chloride: 106 mmol/L (ref 98–111)
Creatinine, Ser: 2.76 mg/dL — ABNORMAL HIGH (ref 0.61–1.24)
GFR calc Af Amer: 22 mL/min — ABNORMAL LOW (ref >60)
GFR calc non Af Amer: 19 mL/min — ABNORMAL LOW (ref >60)
Glucose, Bld: 143 mg/dL — ABNORMAL HIGH (ref 70–99)
Potassium: 4 mmol/L (ref 3.5–5.1)
Sodium: 137 mmol/L (ref 135–145)
Total Bilirubin: 1.4 mg/dL — ABNORMAL HIGH (ref 0.3–1.2)
Total Protein: 5.6 g/dL — ABNORMAL LOW (ref 6.5–8.1)

## 2019-11-25 LAB — BPAM RBC
Blood Product Expiration Date: 202107092359
Blood Product Expiration Date: 202107092359
Blood Product Expiration Date: 202107092359
Blood Product Expiration Date: 202107092359
ISSUE DATE / TIME: 202106210801
ISSUE DATE / TIME: 202106221406
Unit Type and Rh: 6200
Unit Type and Rh: 6200
Unit Type and Rh: 6200
Unit Type and Rh: 6200

## 2019-11-25 LAB — COOXEMETRY PANEL
Carboxyhemoglobin: 1.1 % (ref 0.5–1.5)
Methemoglobin: 1.2 % (ref 0.0–1.5)
O2 Saturation: 56.2 %
Total hemoglobin: 8.5 g/dL — ABNORMAL LOW (ref 12.0–16.0)

## 2019-11-25 LAB — CBC
HCT: 28.7 % — ABNORMAL LOW (ref 39.0–52.0)
Hemoglobin: 9.5 g/dL — ABNORMAL LOW (ref 13.0–17.0)
MCH: 33 pg (ref 26.0–34.0)
MCHC: 33.1 g/dL (ref 30.0–36.0)
MCV: 99.7 fL (ref 80.0–100.0)
Platelets: 133 10*3/uL — ABNORMAL LOW (ref 150–400)
RBC: 2.88 MIL/uL — ABNORMAL LOW (ref 4.22–5.81)
RDW: 17.9 % — ABNORMAL HIGH (ref 11.5–15.5)
WBC: 18.2 10*3/uL — ABNORMAL HIGH (ref 4.0–10.5)
nRBC: 0.2 % (ref 0.0–0.2)

## 2019-11-25 LAB — CULTURE, BLOOD (ROUTINE X 2)
Culture: NO GROWTH
Culture: NO GROWTH
Special Requests: ADEQUATE
Special Requests: ADEQUATE

## 2019-11-25 LAB — GLUCOSE, CAPILLARY
Glucose-Capillary: 201 mg/dL — ABNORMAL HIGH (ref 70–99)
Glucose-Capillary: 180 mg/dL — ABNORMAL HIGH (ref 70–99)
Glucose-Capillary: 159 mg/dL — ABNORMAL HIGH (ref 70–99)
Glucose-Capillary: 148 mg/dL — ABNORMAL HIGH (ref 70–99)

## 2019-11-25 MED ORDER — INSULIN DETEMIR 100 UNIT/ML ~~LOC~~ SOLN
16.0000 [IU] | Freq: Every day | SUBCUTANEOUS | Status: DC
  Administered 2019-11-25 – 2019-12-07 (×13): 16 [IU] via SUBCUTANEOUS
  Filled 2019-11-25 (×13): qty 0.16

## 2019-11-25 MED ORDER — FUROSEMIDE 10 MG/ML IJ SOLN
20.0000 mg/h | INTRAVENOUS | Status: DC
  Administered 2019-11-25 – 2019-11-30 (×12): 20 mg/h via INTRAVENOUS
  Filled 2019-11-25 (×2): qty 25
  Filled 2019-11-25: qty 21
  Filled 2019-11-25: qty 25
  Filled 2019-11-25: qty 21
  Filled 2019-11-25 (×8): qty 25

## 2019-11-25 NOTE — Evaluation (Signed)
Occupational Therapy Evaluation Patient Details Name: William Walker MRN: [id number] DOB: 1926/10/09 Today's Date: 11/25/2019    History of Present Illness 86 yo admitted with chest pain with NSTEMI with severe AS and CAD s/p CABG/AVR 6/21. PMHx:HTN, DM, CAD   Clinical Impression   Pt was independent in self care prior to admission and still mowing his own yard. Pt presents with generalized weakness with impaired standing balance. He needs verbal cues to adhere to sternal precautions. Pt transitioned to EOB with mod assist and stood and transferred with +2 min hand held assist. He requires set up to total assist for ADL. Pt is a good rehab candidate and had excellent family support. Will follow acutely.    Follow Up Recommendations  CIR    Equipment Recommendations  Other (comment) (defer to next venue)    Recommendations for Other Services       Precautions / Restrictions Precautions Precautions: Fall;Sternal Precaution Comments: educated in sternal precautions during mobility      Mobility Bed Mobility Overal bed mobility: Needs Assistance Bed Mobility: Supine to Sit     Supine to sit: Mod assist     General bed mobility comments: assist for hips to EOB with bed pad and to raise trunk, increased time and effort, held heart pillow throughout  Transfers Overall transfer level: Needs assistance Equipment used: 2 person hand held assist Transfers: Sit to/from Stand;Stand Pivot Transfers Sit to Stand: +2 physical assistance;Min assist Stand pivot transfers: +2 physical assistance;Min assist       General transfer comment: cues to use momentum and avoid use of UEs, used bed pad to assist pt to rise, steadying assist as he took pivotal steps to chair    Balance Overall balance assessment: Needs assistance   Sitting balance-Leahy Scale: Fair       Standing balance-Leahy Scale: Poor Standing balance comment: B hand held assist                            ADL either performed or assessed with clinical judgement   ADL Overall ADL's : Needs assistance/impaired Eating/Feeding: Set up;Sitting   Grooming: Set up;Sitting   Upper Body Bathing: Moderate assistance;Sitting   Lower Body Bathing: Total assistance;+2 for physical assistance;Sit to/from stand   Upper Body Dressing : Moderate assistance;Sitting   Lower Body Dressing: +2 for physical assistance;Total assistance;Sit to/from stand   Toilet Transfer: +2 for physical assistance;Minimal assistance;Stand-pivot   Toileting- Clothing Manipulation and Hygiene: +2 for physical assistance;Total assistance;Sit to/from stand               Vision Patient Visual Report: No change from baseline       Perception     Praxis      Pertinent Vitals/Pain Pain Assessment: Faces Faces Pain Scale: Hurts little more Pain Location: incision Pain Descriptors / Indicators: Sore Pain Intervention(s): Monitored during session;Repositioned     Hand Dominance Right   Extremity/Trunk Assessment Upper Extremity Assessment Upper Extremity Assessment: Generalized weakness   Lower Extremity Assessment Lower Extremity Assessment: Defer to PT evaluation   Cervical / Trunk Assessment Cervical / Trunk Assessment: Normal   Communication Communication Communication: HOH (speak slowly)   Cognition Arousal/Alertness: Awake/alert Behavior During Therapy: Flat affect Overall Cognitive Status: Impaired/Different from baseline Area of Impairment: Problem solving                             Problem  Solving: Slow processing;Decreased initiation;Difficulty sequencing;Requires verbal cues;Requires tactile cues     General Comments       Exercises     Shoulder Instructions      Home Living Family/patient expects to be discharged to:: Private residence Living Arrangements: Spouse/significant other Available Help at Discharge: Family;Available 24 hours/day Type of Home:  House Home Access: Stairs to enter [employer] of Steps: 6 Entrance Stairs-Rails: Right;Left Home Layout: Two level;Able to live on main level with bedroom/bathroom     Bathroom Shower/Tub: Tub/shower unit   Bathroom Toilet: Handicapped height     Home Equipment: Environmental consultant - 2 wheels;Bedside commode          Prior Functioning/Environment Level of Independence: Independent with assistive device(s)        Comments: pt pulls up on sink and door to stand from toilet, stands to shower, mows his grass        OT Problem List: Decreased strength;Decreased activity tolerance;Impaired balance (sitting and/or standing);Decreased cognition;Pain;Cardiopulmonary status limiting activity;Decreased knowledge of use of DME or AE;Decreased knowledge of precautions      OT Treatment/Interventions: Self-care/ADL training;DME and/or AE instruction;Cognitive remediation/compensation;Patient/family education;Balance training    OT Goals(Current goals can be found in the care plan section) Acute Rehab OT Goals Patient Stated Goal: return home and fix things OT Goal Formulation: With patient Time For Goal Achievement: 12/09/19 Potential to Achieve Goals: Good ADL Goals Pt Will Perform Grooming: (P) with min assist;standing Pt Will Perform Upper Body Bathing: (P) with min assist;sitting Pt Will Perform Upper Body Dressing: (P) with min assist;sitting Pt Will Transfer to Toilet: (P) with min assist;ambulating;bedside commode (over toilet) Pt Will Perform Toileting - Clothing Manipulation and hygiene: (P) with min assist;sit to/from stand Additional ADL Goal #1: (P) Pt will perform bed mobility with supervision in preparation for ADL. Additional ADL Goal #2: (P) Pt will adhere to sternal precautions with minimal verbal cues.  OT Frequency: Min 2X/week   Barriers to D/C:            Co-evaluation              AM-PAC OT "6 Clicks" Daily Activity     Outcome Measure Help from  another person eating meals?: A Little Help from another person taking care of personal grooming?: A Little Help from another person toileting, which includes using toliet, bedpan, or urinal?: Total Help from another person bathing (including washing, rinsing, drying)?: A Lot Help from another person to put on and taking off regular upper body clothing?: A Lot Help from another person to put on and taking off regular lower body clothing?: Total 6 Click Score: 12   End of Session Equipment Utilized During Treatment: Oxygen Nurse Communication: Mobility status  Activity Tolerance: Patient tolerated treatment well Patient left: in chair;with call bell/phone within reach;with chair alarm set;with family/visitor present  OT Visit Diagnosis: Unsteadiness on feet (R26.81);Pain;Muscle weakness (generalized) (M62.81);Other symptoms and signs involving cognitive function                Time: 5621-3086 OT Time Calculation (min): 30 min Charges:  OT General Charges $OT Visit: 1 Visit OT Evaluation $OT Eval Moderate Complexity: 1 Mod OT Treatments $Therapeutic Activity: 8-22 mins  Nestor Lewandowsky, OTR/L Acute Rehabilitation Services Pager: [phone] Office: [phone]  Malka So 11/25/2019, 4:15 PM

## 2019-11-25 NOTE — Progress Notes (Addendum)
Advanced Heart Failure Rounding Note  PCP-Cardiologist: [name], MD  AHF: Dr. Haroldine Laws     Patient Profile   86 y/o male with HTN, DM, CAD admitted with NSTEMI found to have severe 3v CAD and severe AS with normal EF.   Underwent CABG/AVR 11/22/19.  AHF consulted for post-cardiotomy shock.   Subjective:    Remains on Milrinone 0.125, NE 12 + Dopamine 2.5    Scr worse, 1.53>>2.13>>2.31>2.8. Urine output trending down.     WBC trending up, 8.5>>13.6>>15.0>18. AF. Remains on Zosyn.   Denies SOB. Having some pain.     Objective:   Weight Range: 102.6 kg Body mass index is 31.55 kg/m.   Vital Signs:   Temp:  [96.8 F (36 C)-98 F (36.7 C)] 98 F (36.7 C) (06/24 0400) Pulse Rate:  [94-123] 103 (06/24 0600) Resp:  [7-19] 10 (06/24 0600) BP: (93-145)/(48-66) 119/59 (06/24 0600) SpO2:  [86 %-96 %] 95 % (06/24 0600) Arterial Line BP: (49-159)/(33-156) 95/44 (06/23 1830) Last BM Date: 11/21/19  PAP: (36-38)/(18-22) 38/18 CVP:  [16 mmHg] 16 mmHg   Weight change: Filed Weights   11/22/19 0500 11/23/19 0640 11/24/19 0500  Weight: 91.1 kg 97 kg 102.6 kg    Intake/Output:   Intake/Output Summary (Last 24 hours) at 11/25/2019 0709 Last data filed at 11/25/2019 0400 Gross per 24 hour  Intake 2208.92 ml  Output 665 ml  Net 1543.92 ml      Physical Exam   General:  Sitting in the chair.  No resp difficulty HEENT: normal Neck: supple. JVP 11-12. Carotids 2+ bilat; no bruits. No lymphadenopathy or thryomegaly appreciated. RIJ  Cor: PMI nondisplaced. Regular rate & rhythm. No rubs, gallops or murmurs. Sternal dressing.  Lungs: clear on HFNC  Abdomen: soft, nontender, nondistended. No hepatosplenomegaly. No bruits or masses. Good bowel sounds. Extremities: no cyanosis, clubbing, rash, R and LLE 2+ edema Neuro: alert & orientedx3, cranial nerves grossly intact. moves all 4 extremities w/o difficulty. Affect pleasant   Telemetry   Sinus Tach  100s  EKG    No new EKG to review   Labs    CBC Recent Labs    11/24/19 0430 11/25/19 0451  WBC 15.0* 18.2*  HGB 9.2* 9.5*  HCT 27.6* 28.7*  MCV 98.6 99.7  PLT 146* 833*   Basic Metabolic Panel Recent Labs    11/23/19 0211 11/23/19 0211 11/23/19 1956 11/23/19 1956 11/24/19 0430 11/25/19 0451  NA 137  139   < > 131*   < > 134* 137  K 4.4  4.4   < > 4.5   < > 4.0 4.0  CL 108   < > 103   < > 105 106  CO2 19*   < > 13*   < > 16* 18*  GLUCOSE 138*   < > 303*   < > 296* 143*  BUN 18   < > 25*   < > 27* 32*  CREATININE 1.53*   < > 2.13*   < > 2.31* 2.76*  CALCIUM 7.3*   < > 7.1*   < > 7.5* 7.8*  MG 2.9*  --  2.7*  --   --   --    < > = values in this interval not displayed.   Liver Function Tests Recent Labs    11/24/19 0430 11/25/19 0451  AST 1,344* 599*  ALT 1,131* 1,068*  ALKPHOS 43 53  BILITOT 1.6* 1.4*  PROT 5.6* 5.6*  ALBUMIN 3.7 3.5  No results for input(s): LIPASE, AMYLASE in the last 72 hours. Cardiac Enzymes No results for input(s): CKTOTAL, CKMB, CKMBINDEX, TROPONINI in the last 72 hours.  BNP: BNP (last 3 results) Recent Labs    11/15/19 1947  BNP 606.3*    ProBNP (last 3 results) No results for input(s): PROBNP in the last 8760 hours.   D-Dimer No results for input(s): DDIMER in the last 72 hours. Hemoglobin A1C No results for input(s): HGBA1C in the last 72 hours. Fasting Lipid Panel No results for input(s): CHOL, HDL, LDLCALC, TRIG, CHOLHDL, LDLDIRECT in the last 72 hours. Thyroid Function Tests Recent Labs    11/24/19 0430  TSH 1.349    Other results:   Imaging    [place] EKG SITE RITE  Result Date: 11/25/2019 If Site Rite image not attached, placement could not be confirmed due to current cardiac rhythm.    Medications:     Scheduled Medications: . acetaminophen  1,000 mg Oral Q6H   Or  . acetaminophen (TYLENOL) oral liquid 160 mg/5 mL  1,000 mg Per Tube Q6H  . aspirin EC  325 mg Oral Daily   Or  . aspirin   324 mg Per Tube Daily  . bisacodyl  10 mg Oral Daily   Or  . bisacodyl  10 mg Rectal Daily  . Chlorhexidine Gluconate Cloth  6 each Topical Daily  . docusate sodium  200 mg Oral Daily  . enoxaparin (LOVENOX) injection  30 mg Subcutaneous Q24H  . furosemide  80 mg Intravenous BID  . insulin aspart  0-24 Units Subcutaneous TID AC & HS  . insulin detemir  10 Units Subcutaneous Daily  . levothyroxine  50 mcg Oral QAC breakfast  . mouth rinse  15 mL Mouth Rinse BID  . pantoprazole  40 mg Oral Daily  . sodium chloride flush  10-40 mL Intracatheter Q12H  . sodium chloride flush  3 mL Intravenous Q12H    Infusions: . sodium chloride Stopped (11/23/19 2326)  . sodium chloride    . sodium chloride Stopped (11/24/19 1458)  . DOPamine 2.5 mcg/kg/min (11/25/19 0400)  . lactated ringers    . lactated ringers Stopped (11/24/19 1103)  . milrinone 0.125 mcg/kg/min (11/25/19 0400)  . nitroGLYCERIN    . norepinephrine (LEVOPHED) Adult infusion 12 mcg/min (11/25/19 0400)  . phenylephrine (NEO-SYNEPHRINE) Adult infusion Stopped (11/23/19 2026)  . piperacillin-tazobactam (ZOSYN)  IV Stopped (11/25/19 0105)    PRN Medications: sodium chloride, dextrose, metoprolol tartrate, morphine injection, ondansetron (ZOFRAN) IV, oxyCODONE, sodium chloride flush, sodium chloride flush, traMADol     Assessment/Plan   1. Severe Multivessel CAD + critical AS - 11/22/2019 S/P CABG x 4 + AVR - post op management per CT surgery  - continue ASA + statin -  Continuing holding for post-cardiotomy shock   2. Post Cardiotomy Shock  - ECHO EF 50-55% intraoperative.  - Initial CO-OX post op 41% on Neo, Norepi, and Dopamine. Milrinone added.  - Remains on Milrinone 0.125, NE 12 + Dopamine 2.5  - Co-ox 56%. RIJ introducer out today with PICC placed today per Dr Servando Snare - Continue IV lasix.   - Continue gradual wean of pressors to keep MAP >65 and Co-ox >55 - Place ted hose.   3. Anemia , expected blood loss.   - Hgb down after surgery, now trending up, 9.5 today  - continue to monitor   3. AKI due to post-op ATN - Creatinine trending up 1.3>1.5>2.1>2.3>2.76 - Continue pressor support. Keep MAPs >65 -  Continue to monitor  - May need Nephrology.    4. DMII - On SSI   5. Hypothyroidism - On synthroid 50 mcg.  - TSH normal   6. Muscle weakness. PT following. CIR consulted   7. Shock liver - LFTs trending down slowly   Length of Stay: [place], NP  11/25/2019, 7:09 AM  Advanced Heart Failure Team Pager [phone] (M-F; 7a - 4p)  Please contact [facility] for night-coverage after hours (4p -7a ) and weekends on [email or web address]  Agree with above.   Remains on milrinone, NE and dopamine. Co-ox marginal. Now with worsening volume status and dyspnea in setting of progressive AKI. Weight up 20-25 pounds  General:  Elderly frail HOH HEENT: normal Neck: supple. JVP to jaw Carotids 2+ bilat; no bruits. No lymphadenopathy or thryomegaly appreciated. Cor: PMI nondisplaced. Regular tachy  2/6 SEM RUSB sternal dressing ok. + CT Lungs: coarse Abdomen: soft, nontender, nondistended. No hepatosplenomegaly. No bruits or masses. Good bowel sounds. Extremities: no cyanosis, clubbing, rash, 3+ edema Neuro: alert & orientedx3, cranial nerves grossly intact. moves all 4 extremities w/o difficulty. Affect pleasant  He remains very tenuous despite 3 pressors/inotropes. Volume status and renal function worsening. Suspect he may need CVVHD soon. Will switch to lasix gtt at 20 to try to get fluid off. Continue inotropic support.   CRITICAL CARE Performed by: Glori Bickers  Total critical care time: 35 minutes  Critical care time was exclusive of separately billable procedures and treating other patients.  Critical care was necessary to treat or prevent imminent or life-threatening deterioration.  Critical care was time spent personally by me (independent of midlevel providers or residents)  on the following activities: development of treatment plan with patient and/or surrogate as well as nursing, discussions with consultants, evaluation of patient's response to treatment, examination of patient, obtaining history from patient or surrogate, ordering and performing treatments and interventions, ordering and review of laboratory studies, ordering and review of radiographic studies, pulse oximetry and re-evaluation of patient's condition.  Glori Bickers, MD  11:45 AM

## 2019-11-25 NOTE — Progress Notes (Signed)
Patient ID: William Walker, male   DOB: [birth year], 86 y.o.   MRN: [id number] TCTS DAILY ICU PROGRESS NOTE                   [address]            [place] [address]          [phone]   3 Days Post-Op Procedure(s) (LRB): CORONARY ARTERY BYPASS GRAFTING (CABG), ON PUMP, TIMES FOUR , USING LEFT INTERNAL MAMMARY ARTERY AND ENDOSCOPICALLY HARVESTED RIGHT GREATER SAPHENOUS VEIN (N/A) AORTIC VALVE REPLACEMENT (AVR) USING INSPIRIS 23MM (N/A) TRANSESOPHAGEAL ECHOCARDIOGRAM (TEE) (N/A)  Total Length of Stay:  LOS: 10 days   Subjective: Walked to bedside chair this morning. Awake with appropriate mental status. Ate a whole cup of yogurt for breakfast.  Remains on milrinone 0.184mcg/kg/min, Levophed, and dopamine at 2.71mcg/kg/min.  CoOx 56   Objective: Vital signs in last 24 hours: Temp:  [96.8 F (36 C)-98 F (36.7 C)] 98 F (36.7 C) (06/24 0400) Pulse Rate:  [94-123] 103 (06/24 0600) Cardiac Rhythm: Sinus tachycardia (06/24 0400) Resp:  [7-19] 10 (06/24 0600) BP: (93-145)/(48-66) 119/59 (06/24 0600) SpO2:  [86 %-96 %] 95 % (06/24 0600) Arterial Line BP: (49-159)/(33-156) 95/44 (06/23 1830) Weight:  [102.3 kg] 102.3 kg (06/24 0600)  Filed Weights   11/23/19 0640 11/24/19 0500 11/25/19 0600  Weight: 97 kg 102.6 kg 102.3 kg    Weight change: -0.3 kg   Hemodynamic parameters for last 24 hours: PAP: (36-38)/(18-22) 38/18 CVP:  [16 mmHg] 16 mmHg  Intake/Output from previous day: 06/23 0701 - 06/24 0700 In: 2208.9 [P.O.:1200; I.V.:883.2; IV Piggyback:125.8] Out: 665 [Urine:615; Chest Tube:50]  Intake/Output this shift: No intake/output data recorded.  Current Meds: Scheduled Meds: . acetaminophen  1,000 mg Oral Q6H   Or  . acetaminophen (TYLENOL) oral liquid 160 mg/5 mL  1,000 mg Per Tube Q6H  . aspirin EC  325 mg Oral Daily   Or  . aspirin  324 mg Per Tube Daily  . bisacodyl  10 mg Oral Daily   Or  . bisacodyl  10 mg Rectal Daily  . Chlorhexidine  Gluconate Cloth  6 each Topical Daily  . docusate sodium  200 mg Oral Daily  . enoxaparin (LOVENOX) injection  30 mg Subcutaneous Q24H  . furosemide  80 mg Intravenous BID  . insulin aspart  0-24 Units Subcutaneous TID AC & HS  . insulin detemir  10 Units Subcutaneous Daily  . levothyroxine  50 mcg Oral QAC breakfast  . mouth rinse  15 mL Mouth Rinse BID  . pantoprazole  40 mg Oral Daily  . sodium chloride flush  10-40 mL Intracatheter Q12H  . sodium chloride flush  3 mL Intravenous Q12H   Continuous Infusions: . sodium chloride Stopped (11/23/19 2326)  . sodium chloride    . sodium chloride Stopped (11/24/19 1458)  . DOPamine 2.5 mcg/kg/min (11/25/19 0400)  . lactated ringers    . lactated ringers Stopped (11/24/19 1103)  . milrinone 0.125 mcg/kg/min (11/25/19 0400)  . nitroGLYCERIN    . norepinephrine (LEVOPHED) Adult infusion 12 mcg/min (11/25/19 0400)  . phenylephrine (NEO-SYNEPHRINE) Adult infusion Stopped (11/23/19 2026)  . piperacillin-tazobactam (ZOSYN)  IV Stopped (11/25/19 0105)   PRN Meds:.sodium chloride, dextrose, metoprolol tartrate, morphine injection, ondansetron (ZOFRAN) IV, oxyCODONE, sodium chloride flush, sodium chloride flush, traMADol  General appearance: alert, cooperative and no distress Neurologic: no focal deficits. Heart: regular rate and rhythm Lungs: clear to auscultation bilaterally Abdomen:  Soft, not tender, rare bowel sounds. Extremities: All well perfused, moderate peripheral edema.  Wound: the sternal incision is covered with a dry Aquacel dressing. the right EVH incision is dry and soft.   Lab Results: CBC: Recent Labs    11/24/19 0430 11/25/19 0451  WBC 15.0* 18.2*  HGB 9.2* 9.5*  HCT 27.6* 28.7*  PLT 146* 133*   BMET:  Recent Labs    11/24/19 0430 11/25/19 0451  NA 134* 137  K 4.0 4.0  CL 105 106  CO2 16* 18*  GLUCOSE 296* 143*  BUN 27* 32*  CREATININE 2.31* 2.76*  CALCIUM 7.5* 7.8*    CMET: Lab Results  Component  Value Date   WBC 18.2 (H) 11/25/2019   HGB 9.5 (L) 11/25/2019   HCT 28.7 (L) 11/25/2019   PLT 133 (L) 11/25/2019   GLUCOSE 143 (H) 11/25/2019   CHOL 166 11/16/2019   TRIG 88 11/16/2019   HDL 47 11/16/2019   LDLCALC 101 (H) 11/16/2019   ALT 1,068 (H) 11/25/2019   AST 599 (H) 11/25/2019   NA 137 11/25/2019   K 4.0 11/25/2019   CL 106 11/25/2019   CREATININE 2.76 (H) 11/25/2019   BUN 32 (H) 11/25/2019   CO2 18 (L) 11/25/2019   TSH 1.349 11/24/2019   INR 1.7 (H) 11/22/2019   HGBA1C 7.4 (H) 11/15/2019      PT/INR:  Recent Labs    11/22/19 1805  LABPROT 19.5*  INR 1.7*   Radiology: [place] EKG SITE RITE  Result Date: 11/25/2019 If Site Rite image not attached, placement could not be confirmed due to current cardiac rhythm.    Assessment/Plan: S/P Procedure(s) (LRB): CORONARY ARTERY BYPASS GRAFTING (CABG), ON PUMP, TIMES FOUR , USING LEFT INTERNAL MAMMARY ARTERY AND ENDOSCOPICALLY HARVESTED RIGHT GREATER SAPHENOUS VEIN (N/A) AORTIC VALVE REPLACEMENT (AVR) USING INSPIRIS 23MM (N/A) TRANSESOPHAGEAL ECHOCARDIOGRAM (TEE) (N/A)  -POD-3 CABG x 4 and AVR for severe AS with post bypass cardiogenic shock. Hemodynamics improved. LFT's improving. Plan to continue milrinone at 0.193mcg/kg/min today. Request PICC today. Remove the chest tube.   --Acute on chronic renal insufficiency with volume excess--Creat trending up but slowly. UO ~642ml past 24 hours. Continue dopamine and Lasix 80mg  IV BID.  Avoidning nephrotoxins.   -Expected acute blood loss anemia- hct trending up, minimal ongoing losses. Monitor.   -Type 2 DM- Glucose 200-280. Pre-op A1C 7.4. Increase Levemir to 16 units [place] daily. Continue SSI.   -Respiratory insufficiency- remains on HFNC O2 at 15L/min but is breathing comfortably. Suspect this is related to significant volume overload. CXR shows fairly clear lung fields with basilar volume loss. Continue working on pulmonary hygiene.   -ID-question of sepsis pre-op-Blood Cx  from 6/19 negative. Remains on Zosyn. Leukocytosis noted with no evidence of infection clinically.  Monitor.   [name] 11/25/2019 7:11 AM

## 2019-11-25 NOTE — Progress Notes (Signed)
TCTS BRIEF SICU PROGRESS NOTE  3 Days Post-Op  S/P Procedure(s) (LRB): CORONARY ARTERY BYPASS GRAFTING (CABG), ON PUMP, TIMES FOUR , USING LEFT INTERNAL MAMMARY ARTERY AND ENDOSCOPICALLY HARVESTED RIGHT GREATER SAPHENOUS VEIN (N/A) AORTIC VALVE REPLACEMENT (AVR) USING INSPIRIS 23MM (N/A) TRANSESOPHAGEAL ECHOCARDIOGRAM (TEE) (N/A)   Stable day NSR w/ PVCs PACs BP stable on less levophed Breathing comfortably on 4 L/min via [place] UOP somewhat improved  Plan: Continue current plan  Rexene Alberts, MD 11/25/2019 5:02 PM

## 2019-11-25 NOTE — Progress Notes (Signed)
Peripherally Inserted Central Catheter Placement  The IV Nurse has discussed with the patient and/or persons authorized to consent for the patient, the purpose of this procedure and the potential benefits and risks involved with this procedure.  The benefits include less needle sticks, lab draws from the catheter, and the patient may be discharged home with the catheter. Risks include, but not limited to, infection, bleeding, blood clot (thrombus formation), and puncture of an artery; nerve damage and irregular heartbeat and possibility to perform a PICC exchange if needed/ordered by physician.  Alternatives to this procedure were also discussed.  Bard Power PICC patient education guide, fact sheet on infection prevention and patient information card has been provided to patient /or left at bedside.    PICC Placement Documentation  PICC Double Lumen 11/25/19 PICC Right Brachial 37 cm 1 cm (Active)  Indication for Insertion or Continuance of Line Prolonged intravenous therapies 11/25/19 1100  Exposed Catheter (cm) 1 cm 11/25/19 1100  Site Assessment Clean;Dry;Intact 11/25/19 1100  Lumen #1 Status Flushed;Saline locked;Blood return noted 11/25/19 1100  Lumen #2 Status Flushed;Saline locked;Blood return noted 11/25/19 1100  Dressing Type Transparent;Securing device 11/25/19 1100  Dressing Status Clean;Dry;Intact;Antimicrobial disc in place 11/25/19 1100  Dressing Change Due 12/02/19 11/25/19 1100       Holley Bouche [place] 11/25/2019, 11:35 AM

## 2019-11-25 NOTE — Plan of Care (Signed)
  Problem: Clinical Measurements: Goal: Ability to maintain clinical measurements within normal limits will improve Outcome: Progressing   Problem: Nutrition: Goal: Adequate nutrition will be maintained Outcome: Progressing   Problem: Pain Managment: Goal: General experience of comfort will improve Outcome: Progressing   

## 2019-11-26 ENCOUNTER — Inpatient Hospital Stay (HOSPITAL_COMMUNITY): Payer: Medicare Other

## 2019-11-26 LAB — POCT I-STAT, CHEM 8
BUN: 22 mg/dL (ref 8–23)
BUN: 20 mg/dL (ref 8–23)
BUN: 21 mg/dL (ref 8–23)
BUN: 22 mg/dL (ref 8–23)
BUN: 22 mg/dL (ref 8–23)
BUN: 20 mg/dL (ref 8–23)
BUN: 25 mg/dL — ABNORMAL HIGH (ref 8–23)
Calcium, Ion: 1.14 mmol/L — ABNORMAL LOW (ref 1.15–1.40)
Calcium, Ion: 0.99 mmol/L — ABNORMAL LOW (ref 1.15–1.40)
Calcium, Ion: 1.04 mmol/L — ABNORMAL LOW (ref 1.15–1.40)
Calcium, Ion: 1.07 mmol/L — ABNORMAL LOW (ref 1.15–1.40)
Calcium, Ion: 1.06 mmol/L — ABNORMAL LOW (ref 1.15–1.40)
Calcium, Ion: 1.04 mmol/L — ABNORMAL LOW (ref 1.15–1.40)
Calcium, Ion: 1.03 mmol/L — ABNORMAL LOW (ref 1.15–1.40)
Chloride: 99 mmol/L (ref 98–111)
Chloride: 98 mmol/L (ref 98–111)
Chloride: 99 mmol/L (ref 98–111)
Chloride: 99 mmol/L (ref 98–111)
Chloride: 100 mmol/L (ref 98–111)
Chloride: 99 mmol/L (ref 98–111)
Chloride: 101 mmol/L (ref 98–111)
Creatinine, Ser: 1.8 mg/dL — ABNORMAL HIGH (ref 0.61–1.24)
Creatinine, Ser: 1.2 mg/dL (ref 0.61–1.24)
Creatinine, Ser: 1.2 mg/dL (ref 0.61–1.24)
Creatinine, Ser: 1.2 mg/dL (ref 0.61–1.24)
Creatinine, Ser: 1.2 mg/dL (ref 0.61–1.24)
Creatinine, Ser: 1.2 mg/dL (ref 0.61–1.24)
Creatinine, Ser: 1.1 mg/dL (ref 0.61–1.24)
Glucose, Bld: 157 mg/dL — ABNORMAL HIGH (ref 70–99)
Glucose, Bld: 120 mg/dL — ABNORMAL HIGH (ref 70–99)
Glucose, Bld: 183 mg/dL — ABNORMAL HIGH (ref 70–99)
Glucose, Bld: 185 mg/dL — ABNORMAL HIGH (ref 70–99)
Glucose, Bld: 177 mg/dL — ABNORMAL HIGH (ref 70–99)
Glucose, Bld: 165 mg/dL — ABNORMAL HIGH (ref 70–99)
Glucose, Bld: 139 mg/dL — ABNORMAL HIGH (ref 70–99)
HCT: 31 % — ABNORMAL LOW (ref 39.0–52.0)
HCT: 28 % — ABNORMAL LOW (ref 39.0–52.0)
HCT: 27 % — ABNORMAL LOW (ref 39.0–52.0)
HCT: 30 % — ABNORMAL LOW (ref 39.0–52.0)
HCT: 28 % — ABNORMAL LOW (ref 39.0–52.0)
HCT: 25 % — ABNORMAL LOW (ref 39.0–52.0)
HCT: 32 % — ABNORMAL LOW (ref 39.0–52.0)
Hemoglobin: 10.5 g/dL — ABNORMAL LOW (ref 13.0–17.0)
Hemoglobin: 9.5 g/dL — ABNORMAL LOW (ref 13.0–17.0)
Hemoglobin: 10.2 g/dL — ABNORMAL LOW (ref 13.0–17.0)
Hemoglobin: 9.2 g/dL — ABNORMAL LOW (ref 13.0–17.0)
Hemoglobin: 9.5 g/dL — ABNORMAL LOW (ref 13.0–17.0)
Hemoglobin: 8.5 g/dL — ABNORMAL LOW (ref 13.0–17.0)
Hemoglobin: 10.9 g/dL — ABNORMAL LOW (ref 13.0–17.0)
Potassium: 3.4 mmol/L — ABNORMAL LOW (ref 3.5–5.1)
Potassium: 3.3 mmol/L — ABNORMAL LOW (ref 3.5–5.1)
Potassium: 3.6 mmol/L (ref 3.5–5.1)
Potassium: 3.9 mmol/L (ref 3.5–5.1)
Potassium: 3.6 mmol/L (ref 3.5–5.1)
Potassium: 3.8 mmol/L (ref 3.5–5.1)
Potassium: 3.9 mmol/L (ref 3.5–5.1)
Sodium: 137 mmol/L (ref 135–145)
Sodium: 137 mmol/L (ref 135–145)
Sodium: 136 mmol/L (ref 135–145)
Sodium: 136 mmol/L (ref 135–145)
Sodium: 137 mmol/L (ref 135–145)
Sodium: 136 mmol/L (ref 135–145)
Sodium: 138 mmol/L (ref 135–145)
TCO2: 27 mmol/L (ref 22–32)
TCO2: 28 mmol/L (ref 22–32)
TCO2: 27 mmol/L (ref 22–32)
TCO2: 29 mmol/L (ref 22–32)
TCO2: 27 mmol/L (ref 22–32)
TCO2: 25 mmol/L (ref 22–32)
TCO2: 25 mmol/L (ref 22–32)

## 2019-11-26 LAB — POCT I-STAT 7, (LYTES, BLD GAS, ICA,H+H)
Acid-Base Excess: 0 mmol/L (ref 0.0–2.0)
Acid-Base Excess: 2 mmol/L (ref 0.0–2.0)
Acid-Base Excess: 5 mmol/L — ABNORMAL HIGH (ref 0.0–2.0)
Acid-Base Excess: 0 mmol/L (ref 0.0–2.0)
Bicarbonate: 24.3 mmol/L (ref 20.0–28.0)
Bicarbonate: 26.1 mmol/L (ref 20.0–28.0)
Bicarbonate: 27.5 mmol/L (ref 20.0–28.0)
Bicarbonate: 25.1 mmol/L (ref 20.0–28.0)
Calcium, Ion: 1.13 mmol/L — ABNORMAL LOW (ref 1.15–1.40)
Calcium, Ion: 0.88 mmol/L — CL (ref 1.15–1.40)
Calcium, Ion: 0.93 mmol/L — ABNORMAL LOW (ref 1.15–1.40)
Calcium, Ion: 1.02 mmol/L — ABNORMAL LOW (ref 1.15–1.40)
HCT: 36 % — ABNORMAL LOW (ref 39.0–52.0)
HCT: 28 % — ABNORMAL LOW (ref 39.0–52.0)
HCT: 43 % (ref 39.0–52.0)
HCT: 27 % — ABNORMAL LOW (ref 39.0–52.0)
Hemoglobin: 12.2 g/dL — ABNORMAL LOW (ref 13.0–17.0)
Hemoglobin: 14.6 g/dL (ref 13.0–17.0)
Hemoglobin: 9.5 g/dL — ABNORMAL LOW (ref 13.0–17.0)
Hemoglobin: 9.2 g/dL — ABNORMAL LOW (ref 13.0–17.0)
O2 Saturation: 100 %
O2 Saturation: 100 %
O2 Saturation: 85 %
O2 Saturation: 100 %
Potassium: 3.5 mmol/L (ref 3.5–5.1)
Potassium: 3.5 mmol/L (ref 3.5–5.1)
Potassium: 3.5 mmol/L (ref 3.5–5.1)
Potassium: 3.9 mmol/L (ref 3.5–5.1)
Sodium: 137 mmol/L (ref 135–145)
Sodium: 139 mmol/L (ref 135–145)
Sodium: 140 mmol/L (ref 135–145)
Sodium: 139 mmol/L (ref 135–145)
TCO2: 25 mmol/L (ref 22–32)
TCO2: 27 mmol/L (ref 22–32)
TCO2: 29 mmol/L (ref 22–32)
TCO2: 26 mmol/L (ref 22–32)
pCO2 arterial: 38.2 mmHg (ref 32.0–48.0)
pCO2 arterial: 34.8 mmHg (ref 32.0–48.0)
pCO2 arterial: 37.3 mmHg (ref 32.0–48.0)
pCO2 arterial: 41.2 mmHg (ref 32.0–48.0)
pH, Arterial: 7.411 (ref 7.350–7.450)
pH, Arterial: 7.452 — ABNORMAL HIGH (ref 7.350–7.450)
pH, Arterial: 7.506 — ABNORMAL HIGH (ref 7.350–7.450)
pH, Arterial: 7.393 (ref 7.350–7.450)
pO2, Arterial: 271 mmHg — ABNORMAL HIGH (ref 83.0–108.0)
pO2, Arterial: 326 mmHg — ABNORMAL HIGH (ref 83.0–108.0)
pO2, Arterial: 48 mmHg — ABNORMAL LOW (ref 83.0–108.0)
pO2, Arterial: 175 mmHg — ABNORMAL HIGH (ref 83.0–108.0)

## 2019-11-26 LAB — COMPREHENSIVE METABOLIC PANEL
ALT: 845 U/L — ABNORMAL HIGH (ref 0–44)
AST: 353 U/L — ABNORMAL HIGH (ref 15–41)
Albumin: 3 g/dL — ABNORMAL LOW (ref 3.5–5.0)
Alkaline Phosphatase: 65 U/L (ref 38–126)
Anion gap: 11 (ref 5–15)
BUN: 39 mg/dL — ABNORMAL HIGH (ref 8–23)
CO2: 20 mmol/L — ABNORMAL LOW (ref 22–32)
Calcium: 7.6 mg/dL — ABNORMAL LOW (ref 8.9–10.3)
Chloride: 102 mmol/L (ref 98–111)
Creatinine, Ser: 2.83 mg/dL — ABNORMAL HIGH (ref 0.61–1.24)
GFR calc Af Amer: 22 mL/min — ABNORMAL LOW (ref >60)
GFR calc non Af Amer: 19 mL/min — ABNORMAL LOW (ref >60)
Glucose, Bld: 167 mg/dL — ABNORMAL HIGH (ref 70–99)
Potassium: 3.5 mmol/L (ref 3.5–5.1)
Sodium: 133 mmol/L — ABNORMAL LOW (ref 135–145)
Total Bilirubin: 1 mg/dL (ref 0.3–1.2)
Total Protein: 5.3 g/dL — ABNORMAL LOW (ref 6.5–8.1)

## 2019-11-26 LAB — BASIC METABOLIC PANEL
Anion gap: 13 (ref 5–15)
BUN: 43 mg/dL — ABNORMAL HIGH (ref 8–23)
CO2: 20 mmol/L — ABNORMAL LOW (ref 22–32)
Calcium: 7.7 mg/dL — ABNORMAL LOW (ref 8.9–10.3)
Chloride: 101 mmol/L (ref 98–111)
Creatinine, Ser: 2.92 mg/dL — ABNORMAL HIGH (ref 0.61–1.24)
GFR calc Af Amer: 21 mL/min — ABNORMAL LOW (ref >60)
GFR calc non Af Amer: 18 mL/min — ABNORMAL LOW (ref >60)
Glucose, Bld: 203 mg/dL — ABNORMAL HIGH (ref 70–99)
Potassium: 3.6 mmol/L (ref 3.5–5.1)
Sodium: 134 mmol/L — ABNORMAL LOW (ref 135–145)

## 2019-11-26 LAB — COOXEMETRY PANEL
Carboxyhemoglobin: 0.8 % (ref 0.5–1.5)
Carboxyhemoglobin: 1.1 % (ref 0.5–1.5)
Methemoglobin: 0.7 % (ref 0.0–1.5)
Methemoglobin: 1.2 % (ref 0.0–1.5)
O2 Saturation: 48.9 %
O2 Saturation: 46.6 %
Total hemoglobin: 8.9 g/dL — ABNORMAL LOW (ref 12.0–16.0)
Total hemoglobin: 9.3 g/dL — ABNORMAL LOW (ref 12.0–16.0)

## 2019-11-26 LAB — GLUCOSE, CAPILLARY
Glucose-Capillary: 141 mg/dL — ABNORMAL HIGH (ref 70–99)
Glucose-Capillary: 159 mg/dL — ABNORMAL HIGH (ref 70–99)
Glucose-Capillary: 206 mg/dL — ABNORMAL HIGH (ref 70–99)
Glucose-Capillary: 117 mg/dL — ABNORMAL HIGH (ref 70–99)

## 2019-11-26 LAB — CBC
HCT: 26.3 % — ABNORMAL LOW (ref 39.0–52.0)
Hemoglobin: 8.9 g/dL — ABNORMAL LOW (ref 13.0–17.0)
MCH: 33.5 pg (ref 26.0–34.0)
MCHC: 33.8 g/dL (ref 30.0–36.0)
MCV: 98.9 fL (ref 80.0–100.0)
Platelets: 144 10*3/uL — ABNORMAL LOW (ref 150–400)
RBC: 2.66 MIL/uL — ABNORMAL LOW (ref 4.22–5.81)
RDW: 17.6 % — ABNORMAL HIGH (ref 11.5–15.5)
WBC: 18 10*3/uL — ABNORMAL HIGH (ref 4.0–10.5)
nRBC: 0.1 % (ref 0.0–0.2)

## 2019-11-26 MED ORDER — ENSURE ENLIVE PO LIQD
237.0000 mL | Freq: Two times a day (BID) | ORAL | Status: DC
  Administered 2019-11-26 – 2019-11-27 (×2): 237 mL via ORAL

## 2019-11-26 MED ORDER — MAGIC MOUTHWASH
5.0000 mL | Freq: Three times a day (TID) | ORAL | Status: AC
  Administered 2019-11-26 – 2019-11-27 (×3): 5 mL via ORAL
  Filled 2019-11-26 (×3): qty 5

## 2019-11-26 MED ORDER — ADULT MULTIVITAMIN W/MINERALS CH
1.0000 | ORAL_TABLET | Freq: Every day | ORAL | Status: DC
  Administered 2019-11-26 – 2019-12-07 (×12): 1 via ORAL
  Filled 2019-11-26 (×12): qty 1

## 2019-11-26 MED ORDER — POTASSIUM CHLORIDE 10 MEQ/50ML IV SOLN
10.0000 meq | INTRAVENOUS | Status: AC
  Administered 2019-11-26 (×2): 10 meq via INTRAVENOUS
  Filled 2019-11-26 (×2): qty 50

## 2019-11-26 MED ORDER — WHITE PETROLATUM EX OINT
TOPICAL_OINTMENT | CUTANEOUS | Status: AC
  Filled 2019-11-26: qty 28.35

## 2019-11-26 MED ORDER — DOPAMINE-DEXTROSE 3.2-5 MG/ML-% IV SOLN: 0.0000 ug/kg/min | INTRAVENOUS | Status: DC

## 2019-11-26 MED ORDER — MILRINONE LACTATE IN DEXTROSE 20-5 MG/100ML-% IV SOLN
0.2000 ug/kg/min | INTRAVENOUS | Status: DC
  Administered 2019-11-26 – 2019-12-01 (×9): 0.25 ug/kg/min via INTRAVENOUS
  Administered 2019-12-02: 0.2 ug/kg/min via INTRAVENOUS
  Administered 2019-12-02: 0.25 ug/kg/min via INTRAVENOUS
  Filled 2019-11-26 (×12): qty 100

## 2019-11-26 NOTE — Progress Notes (Signed)
Initial Nutrition Assessment  DOCUMENTATION CODES:   Not applicable  INTERVENTION:   Recommend continuing DYS 3 diet until pt's denture are brought in by family   Ensure Enlive po BID, each supplement provides 350 kcal and 20 grams of protein  MVI daily   NUTRITION DIAGNOSIS:   Increased nutrient needs related to post-op healing as evidenced by estimated needs.  GOAL:   Patient will meet greater than or equal to 90% of their needs  MONITOR:   PO intake, Supplement acceptance, Weight trends, Labs, I & O's, Skin  REASON FOR ASSESSMENT:   Rounds    ASSESSMENT:   Patient with PMH significant for severe calcific aortic stenosis, multivessel CAD s/p stenting, CVD, HTN, HLD, and DM. Presents this admission with moderate AI and severe aortic stenosis.   6/21- s/p AVR, CABG x4  Pt denies loss in appetite PTA. Unable to elaborate on daily meal composition. States he wears dentures but family has yet to bring them. Pt had great appetite prior to surgery but is progressing slowly after. Pt current on DYS 3 diet and states he only wants liquids. Discussed the importance of protein intake to promote post-op healing. Sampled Ensure at bedside. Pt willing to continue them.   Pt unsure of UBW. Records indicate pt has maintained his weight over the last 6 months.  Drips: dopamine, 250 mg lasix in D5 @ 20 ml/hr, milrinone, levophed  Medications: dulcolax, colace, SS novolog, levemir, MVI with minerals,  Labs: Na 133 (L) elevated LFTs CBG 141-180  NUTRITION - FOCUSED PHYSICAL EXAM:    Most Recent Value  Orbital Region No depletion  Upper Arm Region Mild depletion  Thoracic and Lumbar Region Unable to assess  Buccal Region No depletion  Temple Region Mild depletion  Clavicle Bone Region Mild depletion  Clavicle and Acromion Bone Region Mild depletion  Scapular Bone Region Unable to assess  Dorsal Hand No depletion  Patellar Region No depletion  Anterior Thigh Region No depletion   Posterior Calf Region No depletion  Edema (RD Assessment) Severe  [BLE]  Hair Reviewed  Eyes Reviewed  Mouth Reviewed  Skin Reviewed  Nails Reviewed     Diet Order:   Diet Order            DIET DYS 3 Room service appropriate? Yes; Fluid consistency: Thin  Diet effective now                 EDUCATION NEEDS:   Not appropriate for education at this time  Skin:  Skin Assessment: Skin Integrity Issues: Skin Integrity Issues:: Incisions Incisions: R leg, chest  Last BM:  6/20  Height:   Ht Readings from Last 1 Encounters:  11/15/19 5\' 11"  (1.803 m)    Weight:   Wt Readings from Last 1 Encounters:  11/26/19 101.5 kg    BMI:  Body mass index is 31.21 kg/m.  Estimated Nutritional Needs:   Kcal:  2300-2500 kcal  Protein:  115-130 grams  Fluid:  >/= 2.3 L/day   Mariana Single RD, LDN Clinical Nutrition Pager listed in [place]

## 2019-11-26 NOTE — Progress Notes (Signed)
Physical Therapy Treatment Patient Details Name: William Walker MRN: [id number] DOB: 01-22-1927 Today's Date: 11/26/2019    History of Present Illness 86 yo admitted with chest pain with NSTEMI with severe AS and CAD s/p CABG/AVR 6/21. PMHx:HTN, DM, CAD    PT Comments    Pt sitting in recliner on arrival on 4L with SpO2 94-97% throughout. Pt educated for sternal precautions and date with improved mobility this session but limited by fatigue. End of gait sitting at EOB noted bleeding with pt with serosanguinous fluid pocketed in pacing wires dressing and dripping with RN notified and dressing changed.  PT pleasant and tolerating increased mobility this session with improved transfers with D/C plan still appropriate.   HR 106-112 BP pre gait 100/52 (64) Post gait 104/62 (73)   Follow Up Recommendations  CIR     Equipment Recommendations  None recommended by PT    Recommendations for Other Services Rehab consult     Precautions / Restrictions Precautions Precautions: Fall;Sternal Precaution Comments: educated in sternal precautions during mobility    Mobility  Bed Mobility Overal bed mobility: Needs Assistance Bed Mobility: Sit to Supine       Sit to supine: Mod assist   General bed mobility comments: physical assist to lift legs to surface for return to bed. with cues for sequence  Transfers Overall transfer level: Needs assistance   Transfers: Sit to/from Stand Sit to Stand: Mod assist         General transfer comment: cues for hand placement, momentum and right knee blocked to stand from recliner with assist to rise and increased time to steady and place hands on RW  Ambulation/Gait Ambulation/Gait assistance: Min assist Gait Distance (Feet): 35 Feet Assistive device: Rolling walker (2 wheeled) Gait Pattern/deviations: Trunk flexed;Shuffle;Decreased step length - left   Gait velocity interpretation: <1.8 ft/sec, indicate of risk for recurrent falls General  Gait Details: cues for looking up and stepping into RW with slow shuffling gait with pt able to appropriately judge activity tolerance   Stairs             Wheelchair Mobility    Modified Rankin (Stroke Patients Only)       Balance Overall balance assessment: Needs assistance   Sitting balance-Leahy Scale: Fair       Standing balance-Leahy Scale: Poor Standing balance comment: bil UE support on RW in standing                            Cognition Arousal/Alertness: Awake/alert Behavior During Therapy: Flat affect Overall Cognitive Status: Impaired/Different from baseline                               Problem Solving: Slow processing General Comments: pt oriented to place and month not day. Pt HOH      Exercises      General Comments        Pertinent Vitals/Pain Faces Pain Scale: Hurts little more Pain Location: incision Pain Descriptors / Indicators: Sore;Aching Pain Intervention(s): Limited activity within patient's tolerance;Monitored during session;Repositioned    Home Living                      Prior Function            PT Goals (current goals can now be found in the care plan section) Progress towards PT goals: Progressing toward goals  Frequency    Min 3X/week      PT Plan Current plan remains appropriate    Co-evaluation              AM-PAC PT "6 Clicks" Mobility   Outcome Measure  Help needed turning from your back to your side while in a flat bed without using bedrails?: A Little Help needed moving from lying on your back to sitting on the side of a flat bed without using bedrails?: A Lot Help needed moving to and from a bed to a chair (including a wheelchair)?: A Lot Help needed standing up from a chair using your arms (e.g., wheelchair or bedside chair)?: A Lot Help needed to walk in hospital room?: A Little Help needed climbing 3-5 steps with a railing? : A Lot 6 Click Score: 14     End of Session Equipment Utilized During Treatment: Gait belt Activity Tolerance: Patient tolerated treatment well Patient left: with call bell/phone within reach;in bed;with bed alarm set Nurse Communication: Mobility status;Precautions PT Visit Diagnosis: Other abnormalities of gait and mobility (R26.89);Difficulty in walking, not elsewhere classified (R26.2);Muscle weakness (generalized) (M62.81)     Time: 1000-1027 PT Time Calculation (min) (ACUTE ONLY): 27 min  Charges:  $Gait Training: 8-22 mins $Therapeutic Activity: 8-22 mins                     Missael Ferrari P, PT Acute Rehabilitation Services Pager: [phone] Office: [place] 11/26/2019, 1:39 PM

## 2019-11-26 NOTE — Discharge Summary (Signed)
Physician Discharge Summary  Patient ID: William Walker MRN: [id number] DOB/AGE: 84-May-1928 84 y.o.  Admit date: 11/15/2019 Discharge date: 12/07/2019  Admission Diagnoses:  Acute NSTEMI Coronary artery disease Aortic stenosis Dyslipidemia Hypertension Hypothyroidism History of cerebrovascular disease History of peripheral vascular disease Chronic renal insufficiency   Discharge Diagnoses:   Acute NSTEMI Coronary artery disease Aortic stenosis S/P aortic valve replacement with bioprosthetic valve S/P CABG x 4 Dyslipidemia Hypertension Hypothyroidism History of cerebrovascular disease History of peripheral vascular disease Post cardiomyotomy shock Shock liver Acute on Chronic renal insufficiency Expected acute blood loss anemia   Discharged Condition: good  History of Present Illness:   The patient is a 86 year old male who has a reasonable functional status, most recently limited by known severe aortic stenosis and coronary artery disease.  The patient has been followed medically for aortic stenosis.  On this admission,  he presented with acute exacerbation of chest pain.  Evaluation revealed repeat cardiac catheterization, had significant, greater than 80%, left main obstruction, proximal LAD obstruction of 80-90%, 70% obstruction of the first obtuse marginal and 90%  obstruction of the intermediate, 75% mid right obstruction.  Overall, ventricular function was approximately 40%.  The patient presented with myocardial infarction, non-STEMI.  CK-MBs went to 4000 during the patient's initial evaluation.  He was loaded with Plavix.  After full evaluation, it was apparent that the only viable option for treatment included coronary artery bypass grafting and aortic valve replacement.  This was discussed in detail with the patient and his family.  He was aware that the  risks were increased both because of the nature of his presentation, the degree of his disease and also his age.   The patient was agreeable with proceeding and signed informed consent.  Hospital Course:  William Walker remained stable while awaiting Plavix washout. He was prepared and taken to the OR on 11/22/19 where the aortic valve was replaced with a 35mm [employer] Inspiris bioprosthetic valve. Coronary artery bypass grafting x4 was also accomplished. Following the procedure he was transferred to the ICU in stable condition. He was extubated late in the evening on the day of surgery. By the morning of the first post-op day he had developed heart failure with  CoOx of 41%. The heart failure team was consulted for assistance with management.  William Walker was started on inotropic support with milrinone and vasopressor support was also titrated. He was started on low dose dopamine for renal perfusion. His CoOx improved marginally. His acute on chronic renal insufficiency persisted for several days.  He initially had low urine output but this improved with addition of a Lasix drip. His respiratory status remained reasonably stable although he did develop bilateral pleural effusions. These improved as he was diuresed and did not require thoracentesis. He was weaned from supplemental O2 without difficulty. The milrinone was eventually weaned off and he remained hemodynamically stable although he did require oral midodrine for severaly days to support his BP.  His volume status appears to be stabilizing on current dosing of Demadex.  He does some renal insufficiency but his creatinine appears to have stabilized in the 1.5 range.  He does have an expected acute blood loss anemia which is stabilized.  Most recent hemoglobin hematocrit dated 12/07/2019 are 8.8/27.6 respectively.  Most recent cooximetry on 12/07/2019 is 61.3. He was evaluated by the inpatient rehab team and was felt to be an appropriate candidate for admission to that unit.  A bed has become available on today's date and  he is felt to be stable for transfer at this  time.  Consults: cardiology (Advanced Heart Failure)  Significant Diagnostic Studies:   RIGHT/LEFT HEART CATH AND CORONARY ANGIOGRAPHY  Conclusion    Mid LAD to Dist LAD lesion is 20% stenosed.  Ramus lesion is 95% stenosed.  Ost LAD lesion is 99% stenosed.  Ost LM to Mid LM lesion is 75% stenosed.  Prox Cx to Mid Cx lesion is 50% stenosed.  2nd Diag lesion is 90% stenosed.  Prox RCA lesion is 75% stenosed.  Hemodynamic findings consistent with aortic valve stenosis.   William Walker is a 86 y.o. male    [id number] LOCATION:  FACILITY: [facility]  PHYSICIAN: Quay Burow, M.D. Oct 10, 1926   DATE OF PROCEDURE:  11/16/2019  DATE OF DISCHARGE: -    CARDIAC CATHETERIZATION     History obtained from chart review. William Walker a 86 y.o.malewith with a hx ofsevere calcific aortic stenosis, multivessel CAD with moderate diffuse disease and patent LAD stent per last LHC 2019, CVD, hypertension, HLD and DM2who is being seen today in the ER yesterday for chest pain which began on Sunday.  He has chronic nitrate responsive angina.  His troponins rose to 4000.  His EKG showed subtle J-point elevation.  His last 2D echo performed 3 months ago showed severe aortic stenosis.  He is referred for right left heart cath to define his anatomy and physiology.   IMPRESSION: William Walker has had progression of his CAD.  His mid LAD stent is patent.  He has left main/three-vessel disease.  I believe he has at least 75% left main with a 99% ostial LAD 95% proximal to mid ramus branch stenosis as well as progression of disease in his dominant RCA.  He has severe aortic stenosis.  I do not think he is a percutaneous revascularization candidate.  Mynx closure devices were used to hemostatically sealed the right common femoral artery and venous puncture sites.  The patient left lab in stable condition.  Given his left main, severe CAD and the fact that these had chest pain with non-STEMI  I am upgrading him to 2H for closer clinical observation.  William Walker. MD, FACC 11/16/2019 9:52 AM   Diagnostic  ECHOCARDIOGRAM REPORT       Patient Name:  William Walker Date of Exam: 11/16/2019  Medical Rec #: [id number]   Height:    71.0 in  Accession #:  [id number]  Weight:    206.8 lb  Date of Birth: 10/14/1926   BSA:     2.139 m  Patient Age:  89 years   BP:      130/76 mmHg  Patient Gender: M       HR:      79  bpm.  Exam Location: Inpatient   Procedure: 2D Echo, Cardiac Doppler, Color Doppler and Intracardiac       Opacification Agent   Indications:  Aortic Stenosis 424.1 / 135.0    History:    Patient has prior history of Echocardiogram examinations,  most         recent 05/04/2019. CAD, Aortic Valve Disease,         Arrythmias:non-specific ST changes, Signs/Symptoms:Chest  Pain;         Risk Factors:Hypertension, Dyslipidemia, Diabetes and  Former         Smoker.    Sonographer:  Vickie Epley RDCS  Referring Phys: [place]    1. Left ventricular ejection fraction,  by estimation, is 40 to 45%. The  left ventricle has normal function. The left ventricle demonstrates  regional wall motion abnormalities (see scoring diagram/findings for  description). Left ventricular diastolic  parameters are consistent with Grade I diastolic dysfunction (impaired  relaxation). There is moderate hypokinesis of the left ventricular, entire  apical segment.  2. Right ventricular systolic function is normal. The right ventricular  size is normal. There is normal pulmonary artery systolic pressure. The  estimated right ventricular systolic pressure is 44.8 mmHg.  3. The mitral valve is normal in structure. Mild mitral valve  regurgitation.  4. Tricuspid valve regurgitation is mild to moderate.  5. The aortic valve is tricuspid. Aortic valve  regurgitation is mild.  Severe aortic valve stenosis.   Comparison(s): Prior images reviewed side by side. Changes from prior  study are noted. The left ventricular function is worsened. The left  ventricular wall motion abnormality is is new. Aortic stenosis remains  severe, but gradients are slightly lower,  likely due to reduced left ventricular systolic function.   FINDINGS  Left Ventricle: Left ventricular ejection fraction, by estimation, is 40  to 45%. The left ventricle has normal function. The left ventricle  demonstrates regional wall motion abnormalities. Moderate hypokinesis of  the left ventricular, entire apical  segment. Definity contrast agent was given IV to delineate the left  ventricular endocardial borders. The left ventricular internal cavity size  was normal in size. There is no left ventricular hypertrophy. Left  ventricular diastolic parameters are  consistent with Grade I diastolic dysfunction (impaired relaxation).   Right Ventricle: The right ventricular size is normal. No increase in  right ventricular wall thickness. Right ventricular systolic function is  normal. There is normal pulmonary artery systolic pressure. The tricuspid  regurgitant velocity is 2.65 m/s, and  with an assumed right atrial pressure of 3 mmHg, the estimated right  ventricular systolic pressure is 18.5 mmHg.   Left Atrium: Left atrial size was normal in size.   Right Atrium: Right atrial size was normal in size.   Pericardium: There is no evidence of pericardial effusion.   Mitral Valve: The mitral valve is normal in structure. Mild mitral valve  regurgitation.   Tricuspid Valve: The tricuspid valve is normal in structure. Tricuspid  valve regurgitation is mild to moderate.   Aortic Valve: The aortic valve is tricuspid. . There is severe thickening  and moderate calcification of the aortic valve. Aortic valve regurgitation  is mild. Aortic regurgitation PHT measures 418  msec. Severe aortic  stenosis is present. There is severe  thickening of the aortic valve. There is moderate calcification of the  aortic valve. Aortic valve mean gradient measures 34.0 mmHg. Aortic valve  peak gradient measures 54.5 mmHg. Aortic valve area, by VTI measures 0.90  cm.   Pulmonic Valve: The pulmonic valve was not well visualized. Pulmonic valve  regurgitation is not visualized.   Aorta: The aortic root and ascending aorta are structurally normal, with  no evidence of dilitation.   IAS/Shunts: No atrial level shunt detected by color flow Doppler.     LEFT VENTRICLE  PLAX 2D  LVIDd:     5.20 cm   Diastology  LVIDs:     4.00 cm   LV e' lateral:  5.08 cm/s  LV PW:     1.00 cm   LV E/e' lateral: 11.3  LV IVS:    1.00 cm   LV e' medial:  3.23 cm/s  LVOT diam:   2.40  cm   LV E/e' medial: 17.8  LV SV:     75  LV SV Index:  35  LVOT Area:   4.52 cm    LV Volumes (MOD)  LV vol d, MOD A2C: 78.8 ml  LV vol d, MOD A4C: 136.0 ml  LV vol s, MOD A2C: 46.2 ml  LV vol s, MOD A4C: 60.6 ml  LV SV MOD A2C:   32.6 ml  LV SV MOD A4C:   136.0 ml  LV SV MOD BP:   57.3 ml   RIGHT VENTRICLE  RV S prime:   11.00 cm/s  TAPSE (M-mode): 1.9 cm   LEFT ATRIUM      Index    RIGHT ATRIUM      Index  LA diam:   3.00 cm 1.40 cm/m RA Area:   14.90 cm  LA Vol (A2C): 39.4 ml 18.42 ml/m RA Volume:  41.00 ml 19.17 ml/m  LA Vol (A4C): 48.3 ml 22.58 ml/m  AORTIC VALVE  AV Area (Vmax):  0.91 cm  AV Area (Vmean):  0.98 cm  AV Area (VTI):   0.90 cm  AV Vmax:      369.00 cm/s  AV Vmean:     268.000 cm/s  AV VTI:      0.830 m  AV Peak Grad:   54.5 mmHg  AV Mean Grad:   34.0 mmHg  LVOT Vmax:     74.20 cm/s  LVOT Vmean:    57.900 cm/s  LVOT VTI:     0.165 m  LVOT/AV VTI ratio: 0.20  AI PHT:      418 msec    AORTA  Ao Root diam: 4.00 cm   MITRAL VALVE         TRICUSPID VALVE  MV Area (PHT): 3.23 cm  TR Peak grad:  28.1 mmHg  MV Decel Time: 235 msec  TR Vmax:    265.00 cm/s  MV E velocity: 57.40 cm/s  MV A velocity: 98.50 cm/s SHUNTS  MV E/A ratio: 0.58    Systemic VTI: 0.16 m               Systemic Diam: 2.40 cm   Dani Gobble Croitoru MD  Electronically signed by Sanda Klein MD  Signature Date/Time: 11/16/2019/4:48:20 PM     Treatments:   OPERATIVE REPORT  DATE OF PROCEDURE:  11/22/2019  PREOPERATIVE DIAGNOSIS:  Critical aortic stenosis and severe coronary artery disease with left main obstruction.  POSTOPERATIVE DIAGNOSIS:  Critical aortic stenosis and severe coronary artery disease with left main obstruction.  SURGICAL PROCEDURE: 1.  Aortic valve replacement with Inspiris Resilia tissue aortic valve 23 mm, serial #[id number]. 2.  Coronary artery bypass grafting x4 with the left internal mammary to the left anterior descending coronary artery, sequential reverse saphenous vein graft to the intermediate and first obtuse marginal, reverse saphenous vein graft to the posterior  descending with right thigh greater saphenous endoscopic vein harvest.  SURGEON:  Lanelle Bal, MD  FIRST ASSISTANT:  Enid Cutter, PA.  BRIEF HISTORY:  The patient is a 86 year old male who has a reasonable functional status, most recently limited by known severe aortic stenosis and coronary artery disease.  The patient has been followed medically for aortic stenosis.  On this admission,  he presented with acute exacerbation of chest pain.  Evaluation revealed repeat cardiac catheterization, had significant greater than 80% left main obstruction, proximal LAD obstruction of 80-90%, 70% obstruction of the first obtuse marginal and 90%  obstruction of the  intermediate, 75% mid right obstruction.  Overall, ventricular function was approximately 40%.  The patient presented with myocardial infarction, non-STEMI.  CK-MBs went  to 4000 during the patient's initial evaluation.  He was loaded  with Plavix.  After full evaluation, it was apparent that the only viable option for treatment included coronary artery bypass grafting and aortic valve replacement.  This was discussed in detail with the patient and his family.  He was aware that the  risks were increased both because of the nature of his presentation, the degree of his disease and also his age.  The patient was agreeable with proceeding and signed informed consent.  Discharge Exam: Blood pressure (!) 103/54, pulse 70, temperature 97.7 F (36.5 C), temperature source Oral, resp. rate 15, height 5\' 11"  (1.803 m), weight 95.2 kg, SpO2 96 %.  General appearance: alert, cooperative and no distress Heart: regular rate and rhythm Lungs: clear to auscultation bilaterally Abdomen: benign Extremities: + LE edema Wound: incis healing well Disposition: Discharge disposition: [place] Not Defined     Inpatient CIR    Discharge Instructions    Amb Referral to Cardiac Rehabilitation   Complete by: As directed    Diagnosis:  CABG Valve Replacement NSTEMI     Valve: Aortic   CABG X ___: 4   After initial evaluation and assessments completed: Virtual Based Care may be provided alone or in conjunction with Phase 2 Cardiac Rehab based on patient barriers.: Yes   Discharge patient   Complete by: As directed    Inpatient CIR at [facility]   Discharge disposition: [place] Not Defined   Discharge patient date: 12/07/2019     Allergies as of 12/07/2019   No Active Allergies     Medication List    STOP taking these medications   acetaminophen 500 MG tablet Commonly known as: TYLENOL   augmented betamethasone dipropionate 0.05 % cream Commonly known as: DIPROLENE-AF   furosemide 40 MG tablet Commonly known as: LASIX   GOODY HEADACHE PO   isosorbide mononitrate 30 MG 24 hr tablet Commonly known as:  IMDUR   lisinopril 10 MG tablet Commonly known as: ZESTRIL   metoprolol succinate 25 MG 24 hr tablet Commonly known as: Toprol XL   nitroGLYCERIN 0.4 MG SL tablet Commonly known as: NITROSTAT   pravastatin 40 MG tablet Commonly known as: PRAVACHOL   TUMS PO     TAKE these medications   aspirin 325 MG EC tablet Take 1 tablet (325 mg total) by mouth daily. Start taking on: December 08, 2019 What changed:   medication strength  how much to take   enoxaparin 30 MG/0.3ML injection Commonly known as: LOVENOX Inject 0.3 mLs (30 mg total) into the skin daily.   glipiZIDE 5 MG tablet Commonly known as: GLUCOTROL Take 5 mg by mouth daily before breakfast.   levothyroxine 50 MCG tablet Commonly known as: SYNTHROID Take 50 mcg by mouth daily before breakfast.   midodrine 10 MG tablet Commonly known as: PROAMATINE Take 1 tablet (10 mg total) by mouth 3 (three) times daily with meals.   multivitamin with minerals Tabs tablet Take 1 tablet by mouth daily. Start taking on: December 08, 2019   pantoprazole 40 MG tablet Commonly known as: PROTONIX Take 1 tablet (40 mg total) by mouth daily. Start taking on: December 08, 2019   potassium chloride SA 20 MEQ tablet Commonly known as: KLOR-CON Take 2 tablets (40 mEq total) by mouth daily. Start taking  on: December 08, 2019 What changed:   how much to take  how to take this  when to take this  additional instructions   rosuvastatin 10 MG tablet Commonly known as: CRESTOR Take 1 tablet (10 mg total) by mouth daily. Start taking on: December 08, 2019   torsemide 20 MG tablet Commonly known as: DEMADEX Take 1 tablet (20 mg total) by mouth daily.   torsemide 20 MG tablet Commonly known as: DEMADEX Take 2 tablets (40 mg total) by mouth daily. Start taking on: December 08, 2019   traMADol 50 MG tablet Commonly known as: ULTRAM Take 1 tablet (50 mg total) by mouth every 6 (six) hours as needed for up to 7 days for moderate pain.        Follow-up Information    [name], MD Follow up on 11/30/2019.   Specialty: Cardiothoracic Surgery Why: Appointment is at 12:00, please get CXR at 11:30 at [place] located on first floor of our office building Contact information: [place] [address] [place], [place], PA-C Follow up on 12/09/2019.   Specialties: Physician Assistant, Cardiology Why: Appointment is at [employer] information: [place] [id number] [phone]              The patient has been discharged on:   1.Beta Blocker:  Yes [   ]                              No   [ x  ]                              If No, reason: Hypotension  2.Ace Inhibitor/ARB: Yes [   ]                                     No  [  x  ]                                     If No, reason: Hypotension  3.Statin:   Yes [x   ]                  No  [   ]                  If No, reason:  4.Ecasa:  Yes  [ x  ]                  No   [   ]                  If No, reason:   Signed: John Giovanni, PA-C  12/07/2019, 10:29 AM

## 2019-11-26 NOTE — Progress Notes (Addendum)
Advanced Heart Failure Rounding Note  PCP-Cardiologist: [name], MD  AHF: Dr. Haroldine Laws     Patient Profile   86 y/o male with HTN, DM, CAD admitted with NSTEMI found to have severe 3v CAD and severe AS with normal EF.   Underwent CABG/AVR 11/22/19.  AHF consulted for post-cardiotomy shock.   Subjective:    Yesterday switched to lasix drip @ 20 mg per hour.    Remains on Milrinone 0.125, NE 9 mcg + Dopamine 2.5    Scr worse, 1.53>>2.13>>2.31>2.8>>2.8. Urine output picking up.      WBC trending up, 8.5>>13.6>>15.0>18>18 . AF. Remains on Zosyn.   Complaining of chest soreness.     Objective:   Weight Range: 102.3 kg Body mass index is 31.46 kg/m.   Vital Signs:   Temp:  [96.3 F (35.7 C)-98.3 F (36.8 C)] 98.3 F (36.8 C) (06/25 0331) Pulse Rate:  [56-113] 100 (06/25 0045) Resp:  [9-22] 14 (06/25 0045) BP: (83-134)/(43-85) 101/58 (06/25 0045) SpO2:  [91 %-100 %] 96 % (06/25 0045) Last BM Date: 11/21/19  CVP:  [13 mmHg-14 mmHg] 14 mmHg   Weight change: Filed Weights   11/23/19 0640 11/24/19 0500 11/25/19 0600  Weight: 97 kg 102.6 kg 102.3 kg    Intake/Output:   Intake/Output Summary (Last 24 hours) at 11/26/2019 0714 Last data filed at 11/26/2019 0400 Gross per 24 hour  Intake 1551.49 ml  Output 1770 ml  Net -218.51 ml      Physical Exam   CVP 11-12  General:  Elderly. Sitting in the chair. No resp difficulty HEENT: normal Neck: supple. JVP  11-12 . Carotids 2+ bilat; no bruits. No lymphadenopathy or thryomegaly appreciated. Cor: PMI nondisplaced. Regular rate & rhythm. No rubs, gallops or murmurs. Lungs: clear Abdomen: soft, nontender, nondistended. No hepatosplenomegaly. No bruits or masses. Good bowel sounds. Extremities: no cyanosis, clubbing, rash, R and LLE edema 1-2+  Neuro: alert & orientedx3, cranial nerves grossly intact. moves all 4 extremities w/o difficulty. Affect pleasant   Telemetry   SR/ST 90-100s   EKG      No new EKG to review   Labs    CBC Recent Labs    11/25/19 0451 11/26/19 0412  WBC 18.2* 18.0*  HGB 9.5* 8.9*  HCT 28.7* 26.3*  MCV 99.7 98.9  PLT 133* 440*   Basic Metabolic Panel Recent Labs    11/23/19 1956 11/24/19 0430 11/25/19 0451 11/26/19 0412  NA 131*   < > 137 133*  K 4.5   < > 4.0 3.5  CL 103   < > 106 102  CO2 13*   < > 18* 20*  GLUCOSE 303*   < > 143* 167*  BUN 25*   < > 32* 39*  CREATININE 2.13*   < > 2.76* 2.83*  CALCIUM 7.1*   < > 7.8* 7.6*  MG 2.7*  --   --   --    < > = values in this interval not displayed.   Liver Function Tests Recent Labs    11/25/19 0451 11/26/19 0412  AST 599* 353*  ALT 1,068* 845*  ALKPHOS 53 65  BILITOT 1.4* 1.0  PROT 5.6* 5.3*  ALBUMIN 3.5 3.0*   No results for input(s): LIPASE, AMYLASE in the last 72 hours. Cardiac Enzymes No results for input(s): CKTOTAL, CKMB, CKMBINDEX, TROPONINI in the last 72 hours.  BNP: BNP (last 3 results) Recent Labs    11/15/19 1947  BNP 606.3*    ProBNP (  last 3 results) No results for input(s): PROBNP in the last 8760 hours.   D-Dimer No results for input(s): DDIMER in the last 72 hours. Hemoglobin A1C No results for input(s): HGBA1C in the last 72 hours. Fasting Lipid Panel No results for input(s): CHOL, HDL, LDLCALC, TRIG, CHOLHDL, LDLDIRECT in the last 72 hours. Thyroid Function Tests Recent Labs    11/24/19 0430  TSH 1.349    Other results:   Imaging    DG Chest Port 1 View  Result Date: 11/26/2019 CLINICAL DATA:  Chest tube removal, recent cardiac surgery EXAM: PORTABLE CHEST 1 VIEW COMPARISON:  Radiograph 11/25/2019 FINDINGS: Right upper extremity PICC terminates at the superior cavoatrial junction. Telemetry leads and nasal cannula overlie the chest. Interval removal of the left basilar chest tube and right IJ catheter sheath. Abandoned epicardial pacer leads project over the upper abdomen/lower mediastinum. There is stable postoperative mediastinal  contours with features of recent sternotomy and aortic valve replacement with CABG. Lucency of over the lower mediastinum is compatible with a large hiatal hernia. Some persistent layering effusion is present in the lung bases, right slightly greater than left. No pneumothorax. Likely some adjacent areas of passive atelectasis. No significant residual edema. IMPRESSION: 1. Interval removal of left basilar chest tube and right IJ catheter sheath. 2. Otherwise stable postoperative mediastinal contours. 3. Persistent layering effusions, right slightly greater than left. Associated areas of passive atelectasis. No significant residual edema. 4. Large hiatal hernia. Electronically Signed   By: Lovena Le M.D.   On: 11/26/2019 06:34     Medications:     Scheduled Medications: . acetaminophen  1,000 mg Oral Q6H   Or  . acetaminophen (TYLENOL) oral liquid 160 mg/5 mL  1,000 mg Per Tube Q6H  . aspirin EC  325 mg Oral Daily   Or  . aspirin  324 mg Per Tube Daily  . bisacodyl  10 mg Oral Daily   Or  . bisacodyl  10 mg Rectal Daily  . Chlorhexidine Gluconate Cloth  6 each Topical Daily  . docusate sodium  200 mg Oral Daily  . enoxaparin (LOVENOX) injection  30 mg Subcutaneous Q24H  . insulin aspart  0-24 Units Subcutaneous TID AC & HS  . insulin detemir  16 Units Subcutaneous Daily  . levothyroxine  50 mcg Oral QAC breakfast  . mouth rinse  15 mL Mouth Rinse BID  . pantoprazole  40 mg Oral Daily  . sodium chloride flush  10-40 mL Intracatheter Q12H  . sodium chloride flush  3 mL Intravenous Q12H    Infusions: . sodium chloride Stopped (11/23/19 2326)  . sodium chloride    . sodium chloride Stopped (11/24/19 1458)  . DOPamine 2.5 mcg/kg/min (11/26/19 0400)  . furosemide (LASIX) infusion 20 mg/hr (11/26/19 0400)  . lactated ringers    . lactated ringers 125 mL/hr at 11/25/19 1555  . milrinone 0.125 mcg/kg/min (11/26/19 0400)  . nitroGLYCERIN    . norepinephrine (LEVOPHED) Adult infusion 9  mcg/min (11/26/19 0400)  . phenylephrine (NEO-SYNEPHRINE) Adult infusion Stopped (11/23/19 2026)  . piperacillin-tazobactam (ZOSYN)  IV 3.375 g (11/26/19 0658)    PRN Medications: sodium chloride, dextrose, metoprolol tartrate, morphine injection, ondansetron (ZOFRAN) IV, oxyCODONE, sodium chloride flush, sodium chloride flush, traMADol     Assessment/Plan   1. Severe Multivessel CAD + critical AS - 11/22/2019 S/P CABG x 4 + AVR - post op management per CT surgery  - continue ASA + statin -  Continuing holding for post-cardiotomy shock  2. Post Cardiotomy Shock  - ECHO EF 50-55% intraoperative.  - Initial CO-OX post op 41% on Neo, Norepi, and Dopamine. Milrinone added.  - CO-OX 49% -->repeat now. Check BMET 1300  - Supp K  - Remains on Milrinone 0.125, NE 12 + Dopamine 2.5  -Volume status better on lasix drip. Improved urine output.    - Continue gradual wean of pressors to keep MAP >65 and Co-ox >55 - Place ted hose.   3. Anemia , expected blood loss.  - Hgb down after surgery, now trending up, 9.5 today  - continue to monitor   3. AKI due to post-op ATN - Creatinine trending up 1.3>1.5>2.1>2.3>2.76>2.8  - Continue pressor support. Keep MAPs >65 - Continue to monitor  - May need Nephrology.    4. DMII - On SSI   5. Hypothyroidism - On synthroid 50 mcg.  - TSH normal   6. Muscle weakness. PT following. CIR consulted   7. Shock liver - LFTs continue to fall.   Repeat CO-OX  Repeat BMET at 1300  Length of Stay: [place], NP  11/26/2019, 7:14 AM  Advanced Heart Failure Team Pager [phone] (M-F; [place])  Please contact [facility] for night-coverage after hours (4p -7a ) and weekends on [email or web address]  Patient seen with NP, agree with the above note.    Co-ox persistently low this morning at 47%.  Improved UOP on Lasix gtt 20 mg/hr, CVP 11-12.  Creatinine appears to have plateaued around 2.8.  He remains on milrinone 0.125, NE 11, dopamine 2.5.    General: NAD Neck: JVP 10-11, no thyromegaly or thyroid nodule.  Lungs: Clear to auscultation bilaterally with normal respiratory effort. CV: Nondisplaced PMI.  Heart mildly tachy regular S1/S2, no S3/S4, no murmur.  2+ edema to knees.   Abdomen: Soft, nontender, no hepatosplenomegaly, no distention.  Skin: Intact without lesions or rashes.  Neurologic: Alert and oriented x 3.  Psych: Normal affect. Extremities: No clubbing or cyanosis.  HEENT: Normal.   With persistently low co-ox, increase milrinone to 0.25 mcg/kg/min.  Will see if we can slowly wean dopamine.  Creatinine stable as above, continue Lasix gtt 20 mg/hr with ongoing volume overload.   He appears to be in [place] around 100, some ectopy so will try to slowly lower dopamine as above.   CRITICAL CARE Performed by: Loralie Champagne  Total critical care time: 35 minutes  Critical care time was exclusive of separately billable procedures and treating other patients.  Critical care was necessary to treat or prevent imminent or life-threatening deterioration.  Critical care was time spent personally by me on the following activities: development of treatment plan with patient and/or surrogate as well as nursing, discussions with consultants, evaluation of patient's response to treatment, examination of patient, obtaining history from patient or surrogate, ordering and performing treatments and interventions, ordering and review of laboratory studies, ordering and review of radiographic studies, pulse oximetry and re-evaluation of patient's condition.  Loralie Champagne 11/26/2019 9:52 AM

## 2019-11-26 NOTE — Progress Notes (Addendum)
[facility] DAILY ICU PROGRESS NOTE                   [address]            [place] [address]          [phone]   4 Days Post-Op Procedure(s) (LRB): CORONARY ARTERY BYPASS GRAFTING (CABG), ON PUMP, TIMES FOUR , USING LEFT INTERNAL MAMMARY ARTERY AND ENDOSCOPICALLY HARVESTED RIGHT GREATER SAPHENOUS VEIN (N/A) AORTIC VALVE REPLACEMENT (AVR) USING INSPIRIS 23MM (N/A) TRANSESOPHAGEAL ECHOCARDIOGRAM (TEE) (N/A)  Total Length of Stay:  LOS: 11 days   Subjective: Up in the chair. No new complaints or concerns.  Remains on milrinone 0.165mcg/kg/min, Levophed, dopamine. Lasix drip at 20mg /hr started yesterday.   Objective: Vital signs in last 24 hours: Temp:  [98 F (36.7 C)-98.3 F (36.8 C)] 98.3 F (36.8 C) (06/25 0331) Pulse Rate:  [56-113] 100 (06/25 0045) Cardiac Rhythm: Normal sinus rhythm (06/25 0400) Resp:  [9-22] 14 (06/25 0045) BP: (83-134)/(43-85) 101/58 (06/25 0045) SpO2:  [91 %-100 %] 96 % (06/25 0045) Weight:  [101.5 kg] 101.5 kg (06/25 0500)  Filed Weights   11/24/19 0500 11/25/19 0600 11/26/19 0500  Weight: 102.6 kg 102.3 kg 101.5 kg    Weight change: -0.8 kg   Hemodynamic parameters for last 24 hours: CVP:  [13 mmHg-14 mmHg] 14 mmHg  Intake/Output from previous day: 06/24 0701 - 06/25 0700 In: 1551.5 [P.O.:600; I.V.:801.7; IV Piggyback:149.8] Out: 2110 [Urine:2110]  Intake/Output this shift: No intake/output data recorded.  Current Meds: Scheduled Meds: . acetaminophen  1,000 mg Oral Q6H   Or  . acetaminophen (TYLENOL) oral liquid 160 mg/5 mL  1,000 mg Per Tube Q6H  . aspirin EC  325 mg Oral Daily   Or  . aspirin  324 mg Per Tube Daily  . bisacodyl  10 mg Oral Daily   Or  . bisacodyl  10 mg Rectal Daily  . Chlorhexidine Gluconate Cloth  6 each Topical Daily  . docusate sodium  200 mg Oral Daily  . enoxaparin (LOVENOX) injection  30 mg Subcutaneous Q24H  . insulin aspart  0-24 Units Subcutaneous TID AC & HS  . insulin detemir   16 Units Subcutaneous Daily  . levothyroxine  50 mcg Oral QAC breakfast  . mouth rinse  15 mL Mouth Rinse BID  . pantoprazole  40 mg Oral Daily  . sodium chloride flush  10-40 mL Intracatheter Q12H  . sodium chloride flush  3 mL Intravenous Q12H   Continuous Infusions: . sodium chloride Stopped (11/23/19 2326)  . sodium chloride    . sodium chloride Stopped (11/24/19 1458)  . DOPamine 2.5 mcg/kg/min (11/26/19 0400)  . furosemide (LASIX) infusion 20 mg/hr (11/26/19 0400)  . lactated ringers    . lactated ringers 125 mL/hr at 11/25/19 1555  . milrinone 0.125 mcg/kg/min (11/26/19 0400)  . nitroGLYCERIN    . norepinephrine (LEVOPHED) Adult infusion 9 mcg/min (11/26/19 0400)  . piperacillin-tazobactam (ZOSYN)  IV 3.375 g (11/26/19 0658)  . potassium chloride     PRN Meds:.sodium chloride, dextrose, metoprolol tartrate, morphine injection, ondansetron (ZOFRAN) IV, oxyCODONE, sodium chloride flush, sodium chloride flush, traMADol  General appearance: alert, cooperative and no distress Neurologic: no focal deficits. Heart: regular rate and rhythm Lungs: clear to auscultation bilaterally. CXR shows developing bilateral, R>L pleural effusions Abdomen: Soft, not tender, rare bowel sounds. Extremities: All well perfused, moderate peripheral edema unchanged.  Wound: the sternal incision dry ad intact.. The right EVH incision is dry  and soft.   Lab Results: CBC: Recent Labs    11/25/19 0451 11/26/19 0412  WBC 18.2* 18.0*  HGB 9.5* 8.9*  HCT 28.7* 26.3*  PLT 133* 144*   BMET:  Recent Labs    11/25/19 0451 11/26/19 0412  NA 137 133*  K 4.0 3.5  CL 106 102  CO2 18* 20*  GLUCOSE 143* 167*  BUN 32* 39*  CREATININE 2.76* 2.83*  CALCIUM 7.8* 7.6*    CMET: Lab Results  Component Value Date   WBC 18.0 (H) 11/26/2019   HGB 8.9 (L) 11/26/2019   HCT 26.3 (L) 11/26/2019   PLT 144 (L) 11/26/2019   GLUCOSE 167 (H) 11/26/2019   CHOL 166 11/16/2019   TRIG 88 11/16/2019   HDL 47  11/16/2019   LDLCALC 101 (H) 11/16/2019   ALT 845 (H) 11/26/2019   AST 353 (H) 11/26/2019   NA 133 (L) 11/26/2019   K 3.5 11/26/2019   CL 102 11/26/2019   CREATININE 2.83 (H) 11/26/2019   BUN 39 (H) 11/26/2019   CO2 20 (L) 11/26/2019   TSH 1.349 11/24/2019   INR 1.7 (H) 11/22/2019   HGBA1C 7.4 (H) 11/15/2019      PT/INR: No results for input(s): LABPROT, INR in the last 72 hours. Radiology: [facility] Chest Port 1 View  Result Date: 11/26/2019 CLINICAL DATA:  Chest tube removal, recent cardiac surgery EXAM: PORTABLE CHEST 1 VIEW COMPARISON:  Radiograph 11/25/2019 FINDINGS: Right upper extremity PICC terminates at the superior cavoatrial junction. Telemetry leads and nasal cannula overlie the chest. Interval removal of the left basilar chest tube and right IJ catheter sheath. Abandoned epicardial pacer leads project over the upper abdomen/lower mediastinum. There is stable postoperative mediastinal contours with features of recent sternotomy and aortic valve replacement with CABG. Lucency of over the lower mediastinum is compatible with a large hiatal hernia. Some persistent layering effusion is present in the lung bases, right slightly greater than left. No pneumothorax. Likely some adjacent areas of passive atelectasis. No significant residual edema. IMPRESSION: 1. Interval removal of left basilar chest tube and right IJ catheter sheath. 2. Otherwise stable postoperative mediastinal contours. 3. Persistent layering effusions, right slightly greater than left. Associated areas of passive atelectasis. No significant residual edema. 4. Large hiatal hernia. Electronically Signed   By: Lovena Le M.D.   On: 11/26/2019 06:34     Assessment/Plan: S/P Procedure(s) (LRB): CORONARY ARTERY BYPASS GRAFTING (CABG), ON PUMP, TIMES FOUR , USING LEFT INTERNAL MAMMARY ARTERY AND ENDOSCOPICALLY HARVESTED RIGHT GREATER SAPHENOUS VEIN (N/A) AORTIC VALVE REPLACEMENT (AVR) USING INSPIRIS 23MM (N/A) TRANSESOPHAGEAL  ECHOCARDIOGRAM (TEE) (N/A)  -POD-4 CABG x 4 and AVR for severe AS with post bypass cardiogenic shock. Hemodynamically stable although CoOx declined to 49 today.  LFT's continue trend toward normal. Plan to continue milrinone at 0.147mcg/kg/min today. Appreciate input from HF team.   --Acute on chronic renal insufficiency with volume excess--Creat has slowed the upward trend, hopefully at plateau.  UO ~1763ml past 24 hours. Continue dopamine and Lasix drip as per HF.   Avoidning nephrotoxins. Correct K+.  -Expected acute blood loss anemia- hct stable, minimal ongoing losses. Monitor.   -Type 2 DM- Glucose control improed. Pre-op A1C 7.4. Continue Levemir to 16 units [place] daily and SSI.   -Respiratory insufficiency-improving with )2 down to 4L/min and he is breathing comfortably. CXR shows bilateral effusions, R>L. May need thoracentesis eventually but will continue to watch for now.  Continue working on pulmonary hygiene.   -ID-question of sepsis pre-op-Blood  Cx from 6/19 negative. Remains on Zosyn. Leukocytosis noted with no evidence of infection clinically.  Will discuss stopping Zosyn.     William Walker , PA-C [phone] 11/26/2019 7:37 AM  Acute on chronic kidney disease - cr plateau ed 2.7-2.8  Patient awake and alert , up to chair Volume of uop increased  I have seen and examined William Walker and agree with the above assessment  and plan.  William Isaac MD Beeper [phone] Office [phone] 11/26/2019 11:06 AM

## 2019-11-27 ENCOUNTER — Inpatient Hospital Stay (HOSPITAL_COMMUNITY): Payer: Medicare Other

## 2019-11-27 LAB — CBC
HCT: 24.7 % — ABNORMAL LOW (ref 39.0–52.0)
Hemoglobin: 8.3 g/dL — ABNORMAL LOW (ref 13.0–17.0)
MCH: 33.3 pg (ref 26.0–34.0)
MCHC: 33.6 g/dL (ref 30.0–36.0)
MCV: 99.2 fL (ref 80.0–100.0)
Platelets: 132 10*3/uL — ABNORMAL LOW (ref 150–400)
RBC: 2.49 MIL/uL — ABNORMAL LOW (ref 4.22–5.81)
RDW: 18 % — ABNORMAL HIGH (ref 11.5–15.5)
WBC: 15.8 10*3/uL — ABNORMAL HIGH (ref 4.0–10.5)
nRBC: 0.1 % (ref 0.0–0.2)

## 2019-11-27 LAB — COMPREHENSIVE METABOLIC PANEL
ALT: 670 U/L — ABNORMAL HIGH (ref 0–44)
AST: 222 U/L — ABNORMAL HIGH (ref 15–41)
Albumin: 3 g/dL — ABNORMAL LOW (ref 3.5–5.0)
Alkaline Phosphatase: 74 U/L (ref 38–126)
Anion gap: 13 (ref 5–15)
BUN: 43 mg/dL — ABNORMAL HIGH (ref 8–23)
CO2: 20 mmol/L — ABNORMAL LOW (ref 22–32)
Calcium: 7.7 mg/dL — ABNORMAL LOW (ref 8.9–10.3)
Chloride: 100 mmol/L (ref 98–111)
Creatinine, Ser: 2.76 mg/dL — ABNORMAL HIGH (ref 0.61–1.24)
GFR calc Af Amer: 22 mL/min — ABNORMAL LOW (ref >60)
GFR calc non Af Amer: 19 mL/min — ABNORMAL LOW (ref >60)
Glucose, Bld: 141 mg/dL — ABNORMAL HIGH (ref 70–99)
Potassium: 3 mmol/L — ABNORMAL LOW (ref 3.5–5.1)
Sodium: 133 mmol/L — ABNORMAL LOW (ref 135–145)
Total Bilirubin: 1.1 mg/dL (ref 0.3–1.2)
Total Protein: 5.3 g/dL — ABNORMAL LOW (ref 6.5–8.1)

## 2019-11-27 LAB — BASIC METABOLIC PANEL
Anion gap: 11 (ref 5–15)
BUN: 45 mg/dL — ABNORMAL HIGH (ref 8–23)
CO2: 22 mmol/L (ref 22–32)
Calcium: 7.6 mg/dL — ABNORMAL LOW (ref 8.9–10.3)
Chloride: 101 mmol/L (ref 98–111)
Creatinine, Ser: 2.65 mg/dL — ABNORMAL HIGH (ref 0.61–1.24)
GFR calc Af Amer: 23 mL/min — ABNORMAL LOW (ref >60)
GFR calc non Af Amer: 20 mL/min — ABNORMAL LOW (ref >60)
Glucose, Bld: 192 mg/dL — ABNORMAL HIGH (ref 70–99)
Potassium: 3.4 mmol/L — ABNORMAL LOW (ref 3.5–5.1)
Sodium: 134 mmol/L — ABNORMAL LOW (ref 135–145)

## 2019-11-27 LAB — GLUCOSE, CAPILLARY
Glucose-Capillary: 127 mg/dL — ABNORMAL HIGH (ref 70–99)
Glucose-Capillary: 174 mg/dL — ABNORMAL HIGH (ref 70–99)
Glucose-Capillary: 150 mg/dL — ABNORMAL HIGH (ref 70–99)
Glucose-Capillary: 146 mg/dL — ABNORMAL HIGH (ref 70–99)

## 2019-11-27 LAB — COOXEMETRY PANEL
Carboxyhemoglobin: 1 % (ref 0.5–1.5)
Methemoglobin: 0.7 % (ref 0.0–1.5)
O2 Saturation: 63.9 %
Total hemoglobin: 9 g/dL — ABNORMAL LOW (ref 12.0–16.0)

## 2019-11-27 MED ORDER — POTASSIUM CHLORIDE 10 MEQ/50ML IV SOLN
10.0000 meq | INTRAVENOUS | Status: AC
  Administered 2019-11-27 (×3): 10 meq via INTRAVENOUS
  Filled 2019-11-27 (×3): qty 50

## 2019-11-27 MED ORDER — POTASSIUM CHLORIDE CRYS ER 20 MEQ PO TBCR
40.0000 meq | EXTENDED_RELEASE_TABLET | Freq: Once | ORAL | Status: AC
  Administered 2019-11-27: 40 meq via ORAL
  Filled 2019-11-27: qty 2

## 2019-11-27 NOTE — Plan of Care (Signed)
  Problem: Education: Goal: Knowledge of General Education information will improve Description: Including pain rating scale, medication(s)/side effects and non-pharmacologic comfort measures Outcome: Progressing   Problem: Clinical Measurements: Goal: Cardiovascular complication will be avoided Outcome: Progressing   Problem: Activity: Goal: Risk for activity intolerance will decrease Outcome: Progressing   Problem: Nutrition: Goal: Adequate nutrition will be maintained Outcome: Progressing Note: Pt beginning to tolerate food as nausea continues to decrease    Problem: Pain Managment: Goal: General experience of comfort will improve Outcome: Progressing   Problem: Skin Integrity: Goal: Risk for impaired skin integrity will decrease Outcome: Progressing

## 2019-11-27 NOTE — Progress Notes (Signed)
5 Days Post-Op Procedure(s) (LRB): CORONARY ARTERY BYPASS GRAFTING (CABG), ON PUMP, TIMES FOUR , USING LEFT INTERNAL MAMMARY ARTERY AND ENDOSCOPICALLY HARVESTED RIGHT GREATER SAPHENOUS VEIN (N/A) AORTIC VALVE REPLACEMENT (AVR) USING INSPIRIS 23MM (N/A) TRANSESOPHAGEAL ECHOCARDIOGRAM (TEE) (N/A) Subjective: No complaints  Objective: Vital signs in last 24 hours: Temp:  [98.1 F (36.7 C)-98.5 F (36.9 C)] 98.3 F (36.8 C) (06/26 0741) Pulse Rate:  [37-128] 102 (06/26 0800) Cardiac Rhythm: Sinus tachycardia (06/26 0800) Resp:  [12-31] 21 (06/26 0800) BP: (86-124)/(49-98) 115/55 (06/26 0800) SpO2:  [91 %-100 %] 99 % (06/26 0800) Weight:  [99.8 kg] 99.8 kg (06/26 0500)  Hemodynamic parameters for last 24 hours: CVP:  [9 mmHg-13 mmHg] 12 mmHg  Intake/Output from previous day: 06/25 0701 - 06/26 0700 In: 1687.4 [P.O.:480; I.V.:997; IV Piggyback:210.4] Out: 2815 [Urine:2815] Intake/Output this shift: Total I/O In: 157 [I.V.:66.5; IV Piggyback:90.5] Out: 230 [Urine:230]  General appearance: alert and cooperative Neurologic: intact Heart: regular rate and rhythm, S1, S2 normal, no murmur, click, rub or gallop Lungs: clear to auscultation bilaterally Extremities: extremities normal, atraumatic, no cyanosis or edema Wound: c/d/i  Lab Results: Recent Labs    11/26/19 0412 11/27/19 0205  WBC 18.0* 15.8*  HGB 8.9* 8.3*  HCT 26.3* 24.7*  PLT 144* 132*   BMET:  Recent Labs    11/26/19 1335 11/27/19 0205  NA 134* 133*  K 3.6 3.0*  CL 101 100  CO2 20* 20*  GLUCOSE 203* 141*  BUN 43* 43*  CREATININE 2.92* 2.76*  CALCIUM 7.7* 7.7*    PT/INR: No results for input(s): LABPROT, INR in the last 72 hours. ABG    Component Value Date/Time   PHART 7.323 (L) 11/23/2019 0211   HCO3 18.4 (L) 11/23/2019 0211   TCO2 19 (L) 11/23/2019 0211   ACIDBASEDEF 7.0 (H) 11/23/2019 0211   O2SAT 63.9 11/27/2019 0205   CBG (last 3)  Recent Labs    11/26/19 1509 11/26/19 2131  11/27/19 0615  GLUCAP 206* 117* 127*    Assessment/Plan: S/P Procedure(s) (LRB): CORONARY ARTERY BYPASS GRAFTING (CABG), ON PUMP, TIMES FOUR , USING LEFT INTERNAL MAMMARY ARTERY AND ENDOSCOPICALLY HARVESTED RIGHT GREATER SAPHENOUS VEIN (N/A) AORTIC VALVE REPLACEMENT (AVR) USING INSPIRIS 23MM (N/A) TRANSESOPHAGEAL ECHOCARDIOGRAM (TEE) (N/A) Mobilize Diuresis keep in ICU  Appreciate heart failure assistance   LOS: 12 days    Wonda Olds 11/27/2019

## 2019-11-27 NOTE — Progress Notes (Signed)
Patient ID: William Walker, male   DOB: 1927-03-23, 86 y.o.   MRN: [id number]     Advanced Heart Failure Rounding Note  PCP-Cardiologist: [name], MD  AHF: Dr. Haroldine Laws     Patient Profile   86 y/o male with HTN, DM, CAD admitted with NSTEMI found to have severe 3v CAD and severe AS with normal EF.   Underwent CABG/AVR 11/22/19.  AHF consulted for post-cardiotomy shock.   Subjective:    Continues on Lasix gtt 20 mg/hr, NE 10, milrinone 0.25.  Off dopamine. Co-ox better today at 64%.  CVP 10, good diuresis yesterday with weight down 3 lbs.  CXR with R>L pleural effusions.   Creatinine 2.92 => 2.76.    Afebrile, WBCS 15.8 (lower).     Denies dyspnea, sitting up in chair.   Objective:   Weight Range: 99.8 kg Body mass index is 30.69 kg/m.   Vital Signs:   Temp:  [98.1 F (36.7 C)-98.5 F (36.9 C)] 98.3 F (36.8 C) (06/26 0741) Pulse Rate:  [37-128] 98 (06/26 0700) Resp:  [12-31] 14 (06/26 0700) BP: (86-124)/(41-98) 119/50 (06/26 0700) SpO2:  [83 %-100 %] 99 % (06/26 0700) Weight:  [99.8 kg] 99.8 kg (06/26 0500) Last BM Date: 11/21/19  CVP:  [9 mmHg-13 mmHg] 12 mmHg   Weight change: Filed Weights   11/25/19 0600 11/26/19 0500 11/27/19 0500  Weight: 102.3 kg 101.5 kg 99.8 kg    Intake/Output:   Intake/Output Summary (Last 24 hours) at 11/27/2019 0755 Last data filed at 11/27/2019 0610 Gross per 24 hour  Intake 1687.36 ml  Output 2815 ml  Net -1127.64 ml      Physical Exam   CVP 10  General: NAD Neck: JVP 12 cm, no thyromegaly or thyroid nodule.  Lungs: Decreased at bases.  CV: Nondisplaced PMI.  Heart regular S1/S2, no S3/S4, no murmur. 2+ edema to knees.  Abdomen: Soft, nontender, no hepatosplenomegaly, no distention.  Skin: Intact without lesions or rashes.  Neurologic: Alert and oriented x 3.  Psych: Normal affect. Extremities: No clubbing or cyanosis.  HEENT: Normal.    Telemetry   NSR 100s with occasional PVCs (personally  reviewed)  Labs    CBC Recent Labs    11/26/19 0412 11/27/19 0205  WBC 18.0* 15.8*  HGB 8.9* 8.3*  HCT 26.3* 24.7*  MCV 98.9 99.2  PLT 144* 409*   Basic Metabolic Panel Recent Labs    11/26/19 1335 11/27/19 0205  NA 134* 133*  K 3.6 3.0*  CL 101 100  CO2 20* 20*  GLUCOSE 203* 141*  BUN 43* 43*  CREATININE 2.92* 2.76*  CALCIUM 7.7* 7.7*   Liver Function Tests Recent Labs    11/26/19 0412 11/27/19 0205  AST 353* 222*  ALT 845* 670*  ALKPHOS 65 74  BILITOT 1.0 1.1  PROT 5.3* 5.3*  ALBUMIN 3.0* 3.0*   No results for input(s): LIPASE, AMYLASE in the last 72 hours. Cardiac Enzymes No results for input(s): CKTOTAL, CKMB, CKMBINDEX, TROPONINI in the last 72 hours.  BNP: BNP (last 3 results) Recent Labs    11/15/19 1947  BNP 606.3*    ProBNP (last 3 results) No results for input(s): PROBNP in the last 8760 hours.   D-Dimer No results for input(s): DDIMER in the last 72 hours. Hemoglobin A1C No results for input(s): HGBA1C in the last 72 hours. Fasting Lipid Panel No results for input(s): CHOL, HDL, LDLCALC, TRIG, CHOLHDL, LDLDIRECT in the last 72 hours. Thyroid Function Tests No results  for input(s): TSH, T4TOTAL, T3FREE, THYROIDAB in the last 72 hours.  Invalid input(s): FREET3  Other results:   Imaging    No results found.   Medications:     Scheduled Medications: . acetaminophen  1,000 mg Oral Q6H   Or  . acetaminophen (TYLENOL) oral liquid 160 mg/5 mL  1,000 mg Per Tube Q6H  . aspirin EC  325 mg Oral Daily   Or  . aspirin  324 mg Per Tube Daily  . bisacodyl  10 mg Oral Daily   Or  . bisacodyl  10 mg Rectal Daily  . Chlorhexidine Gluconate Cloth  6 each Topical Daily  . docusate sodium  200 mg Oral Daily  . enoxaparin (LOVENOX) injection  30 mg Subcutaneous Q24H  . feeding supplement (ENSURE ENLIVE)  237 mL Oral BID BM  . insulin aspart  0-24 Units Subcutaneous TID AC & HS  . insulin detemir  16 Units Subcutaneous Daily  .  levothyroxine  50 mcg Oral QAC breakfast  . magic mouthwash  5 mL Oral TID  . mouth rinse  15 mL Mouth Rinse BID  . multivitamin with minerals  1 tablet Oral Daily  . pantoprazole  40 mg Oral Daily  . sodium chloride flush  10-40 mL Intracatheter Q12H  . sodium chloride flush  3 mL Intravenous Q12H    Infusions: . sodium chloride Stopped (11/23/19 2326)  . sodium chloride    . sodium chloride Stopped (11/24/19 1458)  . DOPamine Stopped (11/26/19 1647)  . furosemide (LASIX) infusion 20 mg/hr (11/27/19 0610)  . lactated ringers    . lactated ringers 125 mL/hr at 11/25/19 1555  . milrinone 0.25 mcg/kg/min (11/27/19 0610)  . nitroGLYCERIN    . norepinephrine (LEVOPHED) Adult infusion 10 mcg/min (11/27/19 0610)  . potassium chloride 10 mEq (11/27/19 0705)    PRN Medications: sodium chloride, dextrose, metoprolol tartrate, morphine injection, ondansetron (ZOFRAN) IV, oxyCODONE, sodium chloride flush, sodium chloride flush, traMADol     Assessment/Plan   1. Severe Multivessel CAD + critical AS - 11/22/2019 S/P CABG x 4 + bioprosthetic AVR - post op management per CT surgery  - continue ASA + statin - Continuing holding for post-cardiotomy shock   2. Post Cardiotomy Shock  - ECHO EF 50-55% intraoperative.  - Initial CO-OX post op 41% on Neo, Norepi, and Dopamine. Milrinone added, increased to 0.25 on 6/25.  - CO-OX better at 64% today.   - Supplement K  - Remains on Milrinone 0.25, NE 10, now off dopamine. Titrate off NE slowly as tolerates.  - Diuresing well on current Lasix gtt, still some volume overload.  Will continue Lasix gtt today.     - Place ted hose.   3. Anemia , expected blood loss.  - Hgb trending down slowly, 8.3 today.  Transfuse < 8.   3. AKI due to post-op ATN - Lower creatinine today 2.92 => 2.76.  - Continue to monitor  - May need Nephrology.    4. DMII - On SSI   5. Hypothyroidism - On synthroid 50 mcg.  - TSH normal   6. Muscle weakness. -  PT following. CIR consulted   7. Shock liver - LFTs continue to fall.   CRITICAL CARE Performed by: Loralie Champagne  Total critical care time: 35 minutes  Critical care time was exclusive of separately billable procedures and treating other patients.  Critical care was necessary to treat or prevent imminent or life-threatening deterioration.  Critical care was time spent personally  by me on the following activities: development of treatment plan with patient and/or surrogate as well as nursing, discussions with consultants, evaluation of patient's response to treatment, examination of patient, obtaining history from patient or surrogate, ordering and performing treatments and interventions, ordering and review of laboratory studies, ordering and review of radiographic studies, pulse oximetry and re-evaluation of patient's condition.  Loralie Champagne 11/27/2019 7:55 AM

## 2019-11-28 ENCOUNTER — Inpatient Hospital Stay (HOSPITAL_COMMUNITY): Payer: Medicare Other

## 2019-11-28 LAB — GLUCOSE, CAPILLARY
Glucose-Capillary: 145 mg/dL — ABNORMAL HIGH (ref 70–99)
Glucose-Capillary: 147 mg/dL — ABNORMAL HIGH (ref 70–99)
Glucose-Capillary: 179 mg/dL — ABNORMAL HIGH (ref 70–99)
Glucose-Capillary: 189 mg/dL — ABNORMAL HIGH (ref 70–99)
Glucose-Capillary: 184 mg/dL — ABNORMAL HIGH (ref 70–99)
Glucose-Capillary: 171 mg/dL — ABNORMAL HIGH (ref 70–99)

## 2019-11-28 LAB — CBC
HCT: 26.5 % — ABNORMAL LOW (ref 39.0–52.0)
Hemoglobin: 8.9 g/dL — ABNORMAL LOW (ref 13.0–17.0)
MCH: 33.2 pg (ref 26.0–34.0)
MCHC: 33.6 g/dL (ref 30.0–36.0)
MCV: 98.9 fL (ref 80.0–100.0)
Platelets: 165 10*3/uL (ref 150–400)
RBC: 2.68 MIL/uL — ABNORMAL LOW (ref 4.22–5.81)
RDW: 18.1 % — ABNORMAL HIGH (ref 11.5–15.5)
WBC: 10 10*3/uL (ref 4.0–10.5)
nRBC: 0.2 % (ref 0.0–0.2)

## 2019-11-28 LAB — COOXEMETRY PANEL
Carboxyhemoglobin: 1 % (ref 0.5–1.5)
Methemoglobin: 0.7 % (ref 0.0–1.5)
O2 Saturation: 56 %
Total hemoglobin: 9 g/dL — ABNORMAL LOW (ref 12.0–16.0)

## 2019-11-28 LAB — BASIC METABOLIC PANEL
Anion gap: 13 (ref 5–15)
BUN: 44 mg/dL — ABNORMAL HIGH (ref 8–23)
CO2: 21 mmol/L — ABNORMAL LOW (ref 22–32)
Calcium: 7.7 mg/dL — ABNORMAL LOW (ref 8.9–10.3)
Chloride: 100 mmol/L (ref 98–111)
Creatinine, Ser: 2.44 mg/dL — ABNORMAL HIGH (ref 0.61–1.24)
GFR calc Af Amer: 26 mL/min — ABNORMAL LOW (ref >60)
GFR calc non Af Amer: 22 mL/min — ABNORMAL LOW (ref >60)
Glucose, Bld: 136 mg/dL — ABNORMAL HIGH (ref 70–99)
Potassium: 3 mmol/L — ABNORMAL LOW (ref 3.5–5.1)
Sodium: 134 mmol/L — ABNORMAL LOW (ref 135–145)

## 2019-11-28 MED ORDER — SORBITOL 70 % SOLN
15.0000 mL | Freq: Once | Status: AC
  Administered 2019-11-28: 15 mL via ORAL
  Filled 2019-11-28: qty 30

## 2019-11-28 MED ORDER — POTASSIUM CHLORIDE CRYS ER 20 MEQ PO TBCR
40.0000 meq | EXTENDED_RELEASE_TABLET | Freq: Once | ORAL | Status: AC
  Administered 2019-11-28: 40 meq via ORAL
  Filled 2019-11-28: qty 2

## 2019-11-28 MED ORDER — POTASSIUM CHLORIDE 10 MEQ/50ML IV SOLN
10.0000 meq | INTRAVENOUS | Status: AC
  Administered 2019-11-28 (×2): 10 meq via INTRAVENOUS
  Filled 2019-11-28 (×2): qty 50

## 2019-11-28 NOTE — Plan of Care (Signed)
  Problem: Clinical Measurements: Goal: Diagnostic test results will improve Outcome: Progressing Goal: Cardiovascular complication will be avoided Outcome: Progressing   Problem: Activity: Goal: Risk for activity intolerance will decrease Outcome: Progressing   Problem: Nutrition: Goal: Adequate nutrition will be maintained Outcome: Progressing   Problem: Pain Managment: Goal: General experience of comfort will improve Outcome: Progressing

## 2019-11-28 NOTE — Progress Notes (Signed)
6 Days Post-Op Procedure(s) (LRB): CORONARY ARTERY BYPASS GRAFTING (CABG), ON PUMP, TIMES FOUR , USING LEFT INTERNAL MAMMARY ARTERY AND ENDOSCOPICALLY HARVESTED RIGHT GREATER SAPHENOUS VEIN (N/A) AORTIC VALVE REPLACEMENT (AVR) USING INSPIRIS 23MM (N/A) TRANSESOPHAGEAL ECHOCARDIOGRAM (TEE) (N/A) Subjective: No complaints  Objective: Vital signs in last 24 hours: Temp:  [97.5 F (36.4 C)-98.4 F (36.9 C)] 98.1 F (36.7 C) (06/27 0711) Pulse Rate:  [88-105] 91 (06/27 0445) Cardiac Rhythm: Normal sinus rhythm (06/27 0400) Resp:  [12-23] 12 (06/27 0445) BP: (84-145)/(50-83) 120/57 (06/27 0445) SpO2:  [97 %-100 %] 100 % (06/27 0445) Weight:  [96.6 kg] 96.6 kg (06/27 0500)  Hemodynamic parameters for last 24 hours: CVP:  [15 mmHg] 15 mmHg  Intake/Output from previous day: 06/26 0701 - 06/27 0700 In: 4765 [P.O.:700; I.V.:770.4; IV Piggyback:134.6] Out: 3600 [Urine:3600] Intake/Output this shift: No intake/output data recorded.  General appearance: cooperative and no distress Neurologic: intact Heart: regular rate and rhythm, S1, S2 normal, no murmur, click, rub or gallop Lungs: clear to auscultation bilaterally Abdomen: soft, non-tender; bowel sounds normal; no masses,  no organomegaly Extremities: extremities normal, atraumatic, no cyanosis or edema Wound: dressed, dry  Lab Results: Recent Labs    11/27/19 0205 11/28/19 0212  WBC 15.8* 10.0  HGB 8.3* 8.9*  HCT 24.7* 26.5*  PLT 132* 165   BMET:  Recent Labs    11/27/19 1758 11/28/19 0212  NA 134* 134*  K 3.4* 3.0*  CL 101 100  CO2 22 21*  GLUCOSE 192* 136*  BUN 45* 44*  CREATININE 2.65* 2.44*  CALCIUM 7.6* 7.7*    PT/INR: No results for input(s): LABPROT, INR in the last 72 hours. ABG    Component Value Date/Time   PHART 7.323 (L) 11/23/2019 0211   HCO3 18.4 (L) 11/23/2019 0211   TCO2 19 (L) 11/23/2019 0211   ACIDBASEDEF 7.0 (H) 11/23/2019 0211   O2SAT 56.0 11/28/2019 0212   CBG (last 3)  Recent Labs     11/27/19 2101 11/28/19 0630 11/28/19 0713  GLUCAP 146* 147* 145*    Assessment/Plan: S/P Procedure(s) (LRB): CORONARY ARTERY BYPASS GRAFTING (CABG), ON PUMP, TIMES FOUR , USING LEFT INTERNAL MAMMARY ARTERY AND ENDOSCOPICALLY HARVESTED RIGHT GREATER SAPHENOUS VEIN (N/A) AORTIC VALVE REPLACEMENT (AVR) USING INSPIRIS 23MM (N/A) TRANSESOPHAGEAL ECHOCARDIOGRAM (TEE) (N/A) Mobilize Diuresis keep in ICU today   LOS: 13 days    Wonda Olds 11/28/2019

## 2019-11-28 NOTE — Progress Notes (Signed)
Patient ID: William Walker, male   DOB: 09-Jan-1927, 86 y.o.   MRN: [id number]     Advanced Heart Failure Rounding Note  PCP-Cardiologist: [name], MD  AHF: Dr. Haroldine Laws     Patient Profile   86 y/o male with HTN, DM, CAD admitted with NSTEMI found to have severe 3v CAD and severe AS with normal EF.   Underwent CABG/AVR 11/22/19.  AHF consulted for post-cardiotomy shock.   Subjective:    Continues on Lasix gtt 20 mg/hr, NE 4, milrinone 0.25.  Diuresing briskly. Weight down 10 pounds overnight. CVP still ~ 10. Breathing better. Feels less swollen. Denies dyspnea, orthopnea or PND. Using IS.  Co-ox 56%  Creatinine 2.92 => 2.76 => 2.44  Afebrile, WBCS 15.8 -> 10.0    Objective:   Weight Range: 96.6 kg Body mass index is 29.7 kg/m.   Vital Signs:   Temp:  [97.5 F (36.4 C)-98.1 F (36.7 C)] 97.5 F (36.4 C) (06/27 1111) Pulse Rate:  [88-101] 93 (06/27 1315) Resp:  [12-22] 18 (06/27 1315) BP: (84-129)/(47-83) 108/57 (06/27 1315) SpO2:  [97 %-100 %] 98 % (06/27 1315) Weight:  [96.6 kg] 96.6 kg (06/27 0500) Last BM Date: 11/21/19  CVP:  [5 mmHg-15 mmHg] 9 mmHg   Weight change: Filed Weights   11/26/19 0500 11/27/19 0500 11/28/19 0500  Weight: 101.5 kg 99.8 kg 96.6 kg    Intake/Output:   Intake/Output Summary (Last 24 hours) at 11/28/2019 1357 Last data filed at 11/28/2019 1300 Gross per 24 hour  Intake 1811.26 ml  Output 3655 ml  Net -1843.74 ml      Physical Exam   CVP 10 General:  Elderly male up in chair. No resp difficulty HEENT: normal Neck: supple. JVP 10. Carotids 2+ bilat; no bruits. No lymphadenopathy or thryomegaly appreciated. Cor: PMI nondisplaced. Sternal incision ok Regular rate & rhythm. No rubs, gallops or murmurs. Lungs: clear Abdomen: soft, nontender, nondistended. No hepatosplenomegaly. No bruits or masses. Good bowel sounds. Extremities: no cyanosis, clubbing, rash, 2+ edema Neuro: alert & orientedx3, cranial nerves grossly  intact. moves all 4 extremities w/o difficulty. Affect pleasant   Telemetry   NSR 90s with occasional PVCs Personally reviewed  Labs    CBC Recent Labs    11/27/19 0205 11/28/19 0212  WBC 15.8* 10.0  HGB 8.3* 8.9*  HCT 24.7* 26.5*  MCV 99.2 98.9  PLT 132* 259   Basic Metabolic Panel Recent Labs    11/27/19 1758 11/28/19 0212  NA 134* 134*  K 3.4* 3.0*  CL 101 100  CO2 22 21*  GLUCOSE 192* 136*  BUN 45* 44*  CREATININE 2.65* 2.44*  CALCIUM 7.6* 7.7*   Liver Function Tests Recent Labs    11/26/19 0412 11/27/19 0205  AST 353* 222*  ALT 845* 670*  ALKPHOS 65 74  BILITOT 1.0 1.1  PROT 5.3* 5.3*  ALBUMIN 3.0* 3.0*   No results for input(s): LIPASE, AMYLASE in the last 72 hours. Cardiac Enzymes No results for input(s): CKTOTAL, CKMB, CKMBINDEX, TROPONINI in the last 72 hours.  BNP: BNP (last 3 results) Recent Labs    11/15/19 1947  BNP 606.3*    ProBNP (last 3 results) No results for input(s): PROBNP in the last 8760 hours.   D-Dimer No results for input(s): DDIMER in the last 72 hours. Hemoglobin A1C No results for input(s): HGBA1C in the last 72 hours. Fasting Lipid Panel No results for input(s): CHOL, HDL, LDLCALC, TRIG, CHOLHDL, LDLDIRECT in the last 72 hours. Thyroid  Function Tests No results for input(s): TSH, T4TOTAL, T3FREE, THYROIDAB in the last 72 hours.  Invalid input(s): FREET3  Other results:   Imaging    DG Chest Port 1 View  Result Date: 11/28/2019 CLINICAL DATA:  Status post cardiac surgery. EXAM: PORTABLE CHEST 1 VIEW COMPARISON:  November 27, 2019 FINDINGS: A right PICC line terminates in the central SVC. Bilateral effusions with underlying atelectasis. No change in the cardiomegaly. The hila and mediastinum are stable. No other abnormalities. IMPRESSION: Findings are most consistent with layering effusions and underlying atelectasis. No significant interval changes. Electronically Signed   By: Dorise Bullion III M.D   On:  11/28/2019 08:41     Medications:     Scheduled Medications: . aspirin EC  325 mg Oral Daily   Or  . aspirin  324 mg Per Tube Daily  . bisacodyl  10 mg Oral Daily   Or  . bisacodyl  10 mg Rectal Daily  . Chlorhexidine Gluconate Cloth  6 each Topical Daily  . docusate sodium  200 mg Oral Daily  . enoxaparin (LOVENOX) injection  30 mg Subcutaneous Q24H  . feeding supplement (ENSURE ENLIVE)  237 mL Oral BID BM  . insulin aspart  0-24 Units Subcutaneous TID AC & HS  . insulin detemir  16 Units Subcutaneous Daily  . levothyroxine  50 mcg Oral QAC breakfast  . mouth rinse  15 mL Mouth Rinse BID  . multivitamin with minerals  1 tablet Oral Daily  . pantoprazole  40 mg Oral Daily  . potassium chloride  40 mEq Oral Once  . sodium chloride flush  10-40 mL Intracatheter Q12H  . sodium chloride flush  3 mL Intravenous Q12H  . sorbitol  15 mL Oral Once    Infusions: . sodium chloride    . sodium chloride Stopped (11/24/19 1458)  . DOPamine Stopped (11/26/19 1647)  . furosemide (LASIX) infusion 20 mg/hr (11/28/19 1200)  . lactated ringers    . milrinone 0.25 mcg/kg/min (11/28/19 1200)  . nitroGLYCERIN    . norepinephrine (LEVOPHED) Adult infusion 4 mcg/min (11/28/19 1200)    PRN Medications: dextrose, metoprolol tartrate, morphine injection, ondansetron (ZOFRAN) IV, oxyCODONE, sodium chloride flush, sodium chloride flush, traMADol     Assessment/Plan   1. Severe Multivessel CAD + critical AS - 11/22/2019 S/P CABG x 4 + bioprosthetic AVR - doing well. No s/s ischemia - continue ASA + statin/ - b-blocker on hold due to shock  2. Post Cardiotomy Shock  - ECHO EF 50-55% intraoperative.  - Initial CO-OX post op 41% on Neo, Norepi, and Dopamine. Milrinone added, increased to 0.25 on 6/25.  - Now on NE 4 and milrinone 0.25. co-ox 64% -> 56%. Titrate off NE slowly as tolerates.  - Diuresing well on current Lasix gtt. Weight still up 15 pounds. Continue at least one more day  3.  Anemia , expected blood loss.  - Hgb stable 8.9 today  3. AKI due to post-op ATN - Lower creatinine today 2.92 => 2.76 => 2.44  4. DMII - On SSI   5. Hypothyroidism - On synthroid 50 mcg.  - TSH normal   6. Muscle weakness. - PT following. CIR consulted   7. Shock liver - LFTs continue to fall.   8. ID - WBC falling. AF - Zosyn completed 6/26  9. Hypokalemia - have supped today - Discussed dosing with PharmD personally.   CRITICAL CARE Performed by: Glori Bickers  Total critical care time: 35 minutes  Critical  care time was exclusive of separately billable procedures and treating other patients.  Critical care was necessary to treat or prevent imminent or life-threatening deterioration.  Critical care was time spent personally by me on the following activities: development of treatment plan with patient and/or surrogate as well as nursing, discussions with consultants, evaluation of patient's response to treatment, examination of patient, obtaining history from patient or surrogate, ordering and performing treatments and interventions, ordering and review of laboratory studies, ordering and review of radiographic studies, pulse oximetry and re-evaluation of patient's condition.  Quillian Quince Sarenity Ramaker 11/28/2019 1:57 PM

## 2019-11-29 ENCOUNTER — Ambulatory Visit: Payer: Medicare Other | Admitting: Cardiology

## 2019-11-29 LAB — CBC
HCT: 26.5 % — ABNORMAL LOW (ref 39.0–52.0)
Hemoglobin: 8.7 g/dL — ABNORMAL LOW (ref 13.0–17.0)
MCH: 32.3 pg (ref 26.0–34.0)
MCHC: 32.8 g/dL (ref 30.0–36.0)
MCV: 98.5 fL (ref 80.0–100.0)
Platelets: 179 10*3/uL (ref 150–400)
RBC: 2.69 MIL/uL — ABNORMAL LOW (ref 4.22–5.81)
RDW: 17.9 % — ABNORMAL HIGH (ref 11.5–15.5)
WBC: 8.2 10*3/uL (ref 4.0–10.5)
nRBC: 0.2 % (ref 0.0–0.2)

## 2019-11-29 LAB — BASIC METABOLIC PANEL
Anion gap: 12 (ref 5–15)
BUN: 43 mg/dL — ABNORMAL HIGH (ref 8–23)
CO2: 24 mmol/L (ref 22–32)
Calcium: 8 mg/dL — ABNORMAL LOW (ref 8.9–10.3)
Chloride: 98 mmol/L (ref 98–111)
Creatinine, Ser: 2.07 mg/dL — ABNORMAL HIGH (ref 0.61–1.24)
GFR calc Af Amer: 32 mL/min — ABNORMAL LOW (ref >60)
GFR calc non Af Amer: 27 mL/min — ABNORMAL LOW (ref >60)
Glucose, Bld: 147 mg/dL — ABNORMAL HIGH (ref 70–99)
Potassium: 3.8 mmol/L (ref 3.5–5.1)
Sodium: 134 mmol/L — ABNORMAL LOW (ref 135–145)

## 2019-11-29 LAB — POCT I-STAT 7, (LYTES, BLD GAS, ICA,H+H)
Acid-Base Excess: 4 mmol/L — ABNORMAL HIGH (ref 0.0–2.0)
Bicarbonate: 27.5 mmol/L (ref 20.0–28.0)
Calcium, Ion: 0.97 mmol/L — ABNORMAL LOW (ref 1.15–1.40)
HCT: 33 % — ABNORMAL LOW (ref 39.0–52.0)
Hemoglobin: 11.2 g/dL — ABNORMAL LOW (ref 13.0–17.0)
O2 Saturation: 100 %
Potassium: 8.2 mmol/L (ref 3.5–5.1)
Sodium: 131 mmol/L — ABNORMAL LOW (ref 135–145)
TCO2: 29 mmol/L (ref 22–32)
pCO2 arterial: 37.1 mmHg (ref 32.0–48.0)
pH, Arterial: 7.478 — ABNORMAL HIGH (ref 7.350–7.450)
pO2, Arterial: 187 mmHg — ABNORMAL HIGH (ref 83.0–108.0)

## 2019-11-29 LAB — POCT I-STAT, CHEM 8
BUN: 34 mg/dL — ABNORMAL HIGH (ref 8–23)
Calcium, Ion: 0.81 mmol/L — CL (ref 1.15–1.40)
Chloride: 100 mmol/L (ref 98–111)
Creatinine, Ser: 1.3 mg/dL — ABNORMAL HIGH (ref 0.61–1.24)
Glucose, Bld: 143 mg/dL — ABNORMAL HIGH (ref 70–99)
HCT: 29 % — ABNORMAL LOW (ref 39.0–52.0)
Hemoglobin: 9.9 g/dL — ABNORMAL LOW (ref 13.0–17.0)
Potassium: 8.1 mmol/L (ref 3.5–5.1)
Sodium: 132 mmol/L — ABNORMAL LOW (ref 135–145)
TCO2: 28 mmol/L (ref 22–32)

## 2019-11-29 LAB — GLUCOSE, CAPILLARY
Glucose-Capillary: 166 mg/dL — ABNORMAL HIGH (ref 70–99)
Glucose-Capillary: 159 mg/dL — ABNORMAL HIGH (ref 70–99)
Glucose-Capillary: 159 mg/dL — ABNORMAL HIGH (ref 70–99)
Glucose-Capillary: 144 mg/dL — ABNORMAL HIGH (ref 70–99)

## 2019-11-29 LAB — COOXEMETRY PANEL
Carboxyhemoglobin: 1.7 % — ABNORMAL HIGH (ref 0.5–1.5)
Methemoglobin: 1.2 % (ref 0.0–1.5)
O2 Saturation: 60.1 %
Total hemoglobin: 9 g/dL — ABNORMAL LOW (ref 12.0–16.0)

## 2019-11-29 LAB — POTASSIUM: Potassium: 3.4 mmol/L — ABNORMAL LOW (ref 3.5–5.1)

## 2019-11-29 MED ORDER — POTASSIUM CHLORIDE CRYS ER 20 MEQ PO TBCR
40.0000 meq | EXTENDED_RELEASE_TABLET | Freq: Once | ORAL | Status: AC
  Administered 2019-11-29: 40 meq via ORAL
  Filled 2019-11-29: qty 2

## 2019-11-29 MED ORDER — POTASSIUM CHLORIDE 10 MEQ/50ML IV SOLN
10.0000 meq | INTRAVENOUS | Status: AC
  Administered 2019-11-29 (×3): 10 meq via INTRAVENOUS
  Filled 2019-11-29 (×3): qty 50

## 2019-11-29 MED ORDER — MIDODRINE HCL 5 MG PO TABS
5.0000 mg | ORAL_TABLET | Freq: Three times a day (TID) | ORAL | Status: DC
  Administered 2019-11-29 – 2019-12-03 (×14): 5 mg via ORAL
  Filled 2019-11-29 (×14): qty 1

## 2019-11-29 NOTE — Progress Notes (Addendum)
[facility] DAILY ICU PROGRESS NOTE                   [address]            [place] [address]          [phone]   7 Days Post-Op Procedure(s) (LRB): CORONARY ARTERY BYPASS GRAFTING (CABG), ON PUMP, TIMES FOUR , USING LEFT INTERNAL MAMMARY ARTERY AND ENDOSCOPICALLY HARVESTED RIGHT GREATER SAPHENOUS VEIN (N/A) AORTIC VALVE REPLACEMENT (AVR) USING INSPIRIS 23MM (N/A) TRANSESOPHAGEAL ECHOCARDIOGRAM (TEE) (N/A)  Total Length of Stay:  LOS: 14 days   Subjective: Awake and alert, sitting up in the bedside chair. No new complaints or issues.  Walked in the hall with PT this AM.  Objective: Vital signs in last 24 hours: Temp:  [97.5 F (36.4 C)-98.2 F (36.8 C)] 97.9 F (36.6 C) (06/28 0400) Pulse Rate:  [87-101] 92 (06/28 0800) Cardiac Rhythm: Normal sinus rhythm (06/28 0800) Resp:  [13-22] 17 (06/28 0800) BP: (84-129)/(45-83) 105/49 (06/28 0800) SpO2:  [92 %-100 %] 99 % (06/28 0800) Weight:  [97.6 kg] 97.6 kg (06/28 0600)  Filed Weights   11/27/19 0500 11/28/19 0500 11/29/19 0600  Weight: 99.8 kg 96.6 kg 97.6 kg    Weight change: 1 kg   Hemodynamic parameters for last 24 hours: CVP:  [6 mmHg-12 mmHg] 7 mmHg  Intake/Output from previous day: 06/27 0701 - 06/28 0700 In: 1496.7 [P.O.:460; I.V.:820.5; IV Piggyback:216.2] Out: 4010 [Urine:4010]  Intake/Output this shift: Total I/O In: 30.4 [I.V.:30.4] Out: 250 [Urine:250]  Current Meds: Scheduled Meds: . aspirin EC  325 mg Oral Daily   Or  . aspirin  324 mg Per Tube Daily  . bisacodyl  10 mg Oral Daily   Or  . bisacodyl  10 mg Rectal Daily  . Chlorhexidine Gluconate Cloth  6 each Topical Daily  . docusate sodium  200 mg Oral Daily  . enoxaparin (LOVENOX) injection  30 mg Subcutaneous Q24H  . feeding supplement (ENSURE ENLIVE)  237 mL Oral BID BM  . insulin aspart  0-24 Units Subcutaneous TID AC & HS  . insulin detemir  16 Units Subcutaneous Daily  . levothyroxine  50 mcg Oral QAC breakfast  .  mouth rinse  15 mL Mouth Rinse BID  . multivitamin with minerals  1 tablet Oral Daily  . pantoprazole  40 mg Oral Daily  . potassium chloride  40 mEq Oral Once  . sodium chloride flush  10-40 mL Intracatheter Q12H  . sodium chloride flush  3 mL Intravenous Q12H   Continuous Infusions: . sodium chloride    . sodium chloride Stopped (11/24/19 1458)  . furosemide (LASIX) infusion 20 mg/hr (11/29/19 0800)  . lactated ringers    . milrinone 0.25 mcg/kg/min (11/29/19 0800)  . nitroGLYCERIN    . norepinephrine (LEVOPHED) Adult infusion 2 mcg/min (11/29/19 0800)   PRN Meds:.dextrose, metoprolol tartrate, morphine injection, ondansetron (ZOFRAN) IV, oxyCODONE, sodium chloride flush, sodium chloride flush, traMADol  General appearance: alert, cooperative and mild distress Neurologic: intact Heart: regular rate and rhythm Lungs: Breath sounds are clear.  Abdomen: Soft and non-tender.  Extremities: Edema gradually resloving.  Wound: the sternotomy and LE EVH incisions are intact and healing with no sign of complication.   Lab Results: CBC: Recent Labs    11/28/19 0212 11/29/19 0523  WBC 10.0 8.2  HGB 8.9* 8.7*  HCT 26.5* 26.5*  PLT 165 179   BMET:  Recent Labs    11/28/19 0212 11/28/19 2979  11/28/19 2330 11/29/19 0523  NA 134*  --   --  134*  K 3.0*   < > 3.4* 3.8  CL 100  --   --  98  CO2 21*  --   --  24  GLUCOSE 136*  --   --  147*  BUN 44*  --   --  43*  CREATININE 2.44*  --   --  2.07*  CALCIUM 7.7*  --   --  8.0*   < > = values in this interval not displayed.    CMET: Lab Results  Component Value Date   WBC 8.2 11/29/2019   HGB 8.7 (L) 11/29/2019   HCT 26.5 (L) 11/29/2019   PLT 179 11/29/2019   GLUCOSE 147 (H) 11/29/2019   CHOL 166 11/16/2019   TRIG 88 11/16/2019   HDL 47 11/16/2019   LDLCALC 101 (H) 11/16/2019   ALT 670 (H) 11/27/2019   AST 222 (H) 11/27/2019   NA 134 (L) 11/29/2019   K 3.8 11/29/2019   CL 98 11/29/2019   CREATININE 2.07 (H)  11/29/2019   BUN 43 (H) 11/29/2019   CO2 24 11/29/2019   TSH 1.349 11/24/2019   INR 1.7 (H) 11/22/2019   HGBA1C 7.4 (H) 11/15/2019      PT/INR: No results for input(s): LABPROT, INR in the last 72 hours.   Radiology:   EXAM: PORTABLE CHEST 1 VIEW  COMPARISON:  November 27, 2019  FINDINGS: A right PICC line terminates in the central SVC. Bilateral effusions with underlying atelectasis. No change in the cardiomegaly. The hila and mediastinum are stable. No other abnormalities.  IMPRESSION: Findings are most consistent with layering effusions and underlying atelectasis. No significant interval changes.   Electronically Signed   By: Dorise Bullion III M.D   On: 11/28/2019 08:41   Assessment/Plan: S/P Procedure(s) (LRB): CORONARY ARTERY BYPASS GRAFTING (CABG), ON PUMP, TIMES FOUR , USING LEFT INTERNAL MAMMARY ARTERY AND ENDOSCOPICALLY HARVESTED RIGHT GREATER SAPHENOUS VEIN (N/A) AORTIC VALVE REPLACEMENT (AVR) USING INSPIRIS 23MM (N/A) TRANSESOPHAGEAL ECHOCARDIOGRAM (TEE) (N/A)   -POD-7 CABG x 4 and AVR for severe AS with post bypass cardiogenic shock. Hemodynamics improving,  CoOx 60  And CVP 13 today.  Remains on Milrinone at 0.34mcg/kg/min.  Appreciate input from HF team--plan is to continue diuresis another 24 hours and continue milrinone and NE at current doses.  Continue ICU care.   --Acute on chronic renal insufficiency with volume excess--Creat trending down and he is now diuresing well.  HF plans to continue Lasix drip another 24 hours.   -Expected acute blood loss anemia- hct stable,  Monitor.   -Type 2 DM-  Pre-op A1C 7.4.  Glucose control adequate- 160-190 past 24 hours. PO intake still marginal so will continue Levemir to 16 units [place]a daily and SSI.   -Respiratory insufficiency- continues to improve with diuresis. O2 sats adequate on RA, bilateral pleural effusions were improving on last CXR 11/28/19. continue to encourage pulmonary hygiene.    Antony Odea, PA-C [phone] 11/29/2019 9:05 AM  I have seen and examined William Walker and agree with the above assessment  and plan.  [name] MD Beeper [phone] Office [phone] 11/29/2019 1:07 PM

## 2019-11-29 NOTE — Plan of Care (Signed)
  Problem: Health Behavior/Discharge Planning: Goal: Ability to manage health-related needs will improve Outcome: Progressing   Problem: Safety: Goal: Ability to remain free from injury will improve Outcome: Progressing   Problem: Activity: Goal: Ability to return to baseline activity level will improve Outcome: Progressing

## 2019-11-29 NOTE — Progress Notes (Addendum)
Patient ID: William Walker, male   DOB: 1926/09/06, 86 y.o.   MRN: [id number]     Advanced Heart Failure Rounding Note  PCP-Cardiologist: [name], MD  AHF: Dr. Haroldine Laws     Patient Profile   86 y/o male with HTN, DM, CAD admitted with NSTEMI found to have severe 3v CAD and severe AS with normal EF.   Underwent CABG/AVR 11/22/19.  AHF consulted for post-cardiotomy shock.   Subjective:    Continues on Lasix gtt 20 mg/hr, NE 3.5, milrinone 0.25. Cuff MAPs in the 70s.   -4L in UOP yesterday. Wt charted +3lb up from yesterday, doubt accurate (daily I/O yesterday -2.5L). CVP 13.   Co-ox 60%.   Creatinine 2.92 => 2.76 => 2.44=>2.07  Afebrile, WBCs 15.8 -> 10.0->8.2.   OOB sitting in chair. Ate a small amount of breakfast. No chest pain or dyspnea.     Objective:   Weight Range: 97.6 kg Body mass index is 30.01 kg/m.   Vital Signs:   Temp:  [97.5 F (36.4 C)-98.2 F (36.8 C)] 97.9 F (36.6 C) (06/28 0400) Pulse Rate:  [87-101] 97 (06/28 0700) Resp:  [13-22] 18 (06/28 0700) BP: (84-129)/(45-83) 97/54 (06/28 0700) SpO2:  [92 %-100 %] 99 % (06/28 0700) Weight:  [97.6 kg] 97.6 kg (06/28 0600) Last BM Date: 11/28/19  CVP:  [5 mmHg-12 mmHg] 7 mmHg   Weight change: Filed Weights   11/27/19 0500 11/28/19 0500 11/29/19 0600  Weight: 99.8 kg 96.6 kg 97.6 kg    Intake/Output:   Intake/Output Summary (Last 24 hours) at 11/29/2019 0715 Last data filed at 11/29/2019 0600 Gross per 24 hour  Intake 1465.83 ml  Output 4010 ml  Net -2544.17 ml      Physical Exam   PHYSICAL EXAM: CVP 13 General:  Elderly WM sitting up in chair. No respiratory difficulty HEENT: normal Neck: supple. JVD elevated to ear. Carotids 2+ bilat; no bruits. No lymphadenopathy or thyromegaly appreciated. Cor: PMI nondisplaced. Regular rate & rhythm. No rubs, gallops or murmurs. Sternotomy site ok.  Lungs: decreased BS at the bases  Abdomen: soft, nontender, nondistended. No  hepatosplenomegaly. No bruits or masses. Good bowel sounds. Extremities: no cyanosis, clubbing, rash, 2+ bilateral LEE up to knees Neuro: alert & oriented x 3, cranial nerves grossly intact. moves all 4 extremities w/o difficulty. Affect pleasant.    Telemetry   NSR 90s Personally reviewed  Labs    CBC Recent Labs    11/28/19 0212 11/29/19 0523  WBC 10.0 8.2  HGB 8.9* 8.7*  HCT 26.5* 26.5*  MCV 98.9 98.5  PLT 165 431   Basic Metabolic Panel Recent Labs    11/28/19 0212 11/28/19 0212 11/28/19 2330 11/29/19 0523  NA 134*  --   --  134*  K 3.0*   < > 3.4* 3.8  CL 100  --   --  98  CO2 21*  --   --  24  GLUCOSE 136*  --   --  147*  BUN 44*  --   --  43*  CREATININE 2.44*  --   --  2.07*  CALCIUM 7.7*  --   --  8.0*   < > = values in this interval not displayed.   Liver Function Tests Recent Labs    11/27/19 0205  AST 222*  ALT 670*  ALKPHOS 74  BILITOT 1.1  PROT 5.3*  ALBUMIN 3.0*   No results for input(s): LIPASE, AMYLASE in the last 72 hours. Cardiac Enzymes  No results for input(s): CKTOTAL, CKMB, CKMBINDEX, TROPONINI in the last 72 hours.  BNP: BNP (last 3 results) Recent Labs    11/15/19 1947  BNP 606.3*    ProBNP (last 3 results) No results for input(s): PROBNP in the last 8760 hours.   D-Dimer No results for input(s): DDIMER in the last 72 hours. Hemoglobin A1C No results for input(s): HGBA1C in the last 72 hours. Fasting Lipid Panel No results for input(s): CHOL, HDL, LDLCALC, TRIG, CHOLHDL, LDLDIRECT in the last 72 hours. Thyroid Function Tests No results for input(s): TSH, T4TOTAL, T3FREE, THYROIDAB in the last 72 hours.  Invalid input(s): FREET3  Other results:   Imaging    No results found.   Medications:     Scheduled Medications: . aspirin EC  325 mg Oral Daily   Or  . aspirin  324 mg Per Tube Daily  . bisacodyl  10 mg Oral Daily   Or  . bisacodyl  10 mg Rectal Daily  . Chlorhexidine Gluconate Cloth  6 each  Topical Daily  . docusate sodium  200 mg Oral Daily  . enoxaparin (LOVENOX) injection  30 mg Subcutaneous Q24H  . feeding supplement (ENSURE ENLIVE)  237 mL Oral BID BM  . insulin aspart  0-24 Units Subcutaneous TID AC & HS  . insulin detemir  16 Units Subcutaneous Daily  . levothyroxine  50 mcg Oral QAC breakfast  . mouth rinse  15 mL Mouth Rinse BID  . multivitamin with minerals  1 tablet Oral Daily  . pantoprazole  40 mg Oral Daily  . sodium chloride flush  10-40 mL Intracatheter Q12H  . sodium chloride flush  3 mL Intravenous Q12H    Infusions: . sodium chloride    . sodium chloride Stopped (11/24/19 1458)  . furosemide (LASIX) infusion 20 mg/hr (11/29/19 0600)  . lactated ringers    . milrinone 0.25 mcg/kg/min (11/29/19 0600)  . nitroGLYCERIN    . norepinephrine (LEVOPHED) Adult infusion 3.5 mcg/min (11/29/19 0600)    PRN Medications: dextrose, metoprolol tartrate, morphine injection, ondansetron (ZOFRAN) IV, oxyCODONE, sodium chloride flush, sodium chloride flush, traMADol     Assessment/Plan   1. Severe Multivessel CAD + critical AS - 11/22/2019 S/P CABG x 4 + bioprosthetic AVR - doing well. No s/s ischemia - continue ASA  - statin on hold for elevated LFTs (downtrending) - b-blocker on hold due to shock  2. Post Cardiotomy Shock  - ECHO EF 50-55% intraoperative.  - Initial CO-OX post op 41% on Neo, Norepi, and Dopamine. Milrinone added, increased to 0.25 on 6/25.  - Now on NE 3.5 and milrinone 0.25. Co-ox 60%. Titrate off NE slowly as tolerates. If difficulty weaning NE, can add midodrine.  - Diuresing well on current Lasix gtt but still volume overloaded. CVP 13 and edematous. Continue lasix gtt at 20/hr. Consider dose of metolazone.  3. Anemia , expected blood loss.  - Hgb stable 8.7 today  3. AKI due to post-op ATN - Lower creatinine today 2.92 => 2.76 => 2.44=>2.07  4. DMII - On SSI   5. Hypothyroidism - On synthroid 50 mcg.  - TSH normal   6.  Muscle weakness. - PT following. CIR consulted   7. Shock liver - LFTs continue to fall.   8. ID - WBC have normalized. AF - Zosyn completed 6/26  9. Hypokalemia - 3.8 today.  - Will supp w/ KCl w/ diuresis  - Discussed dosing with PharmD personally.   Lyda Jester PA-C 11/29/2019 7:15  AM   Patient seen and examined with the above-signed Advanced Practice Provider and/or Housestaff. I personally reviewed laboratory data, imaging studies and relevant notes. I independently examined the patient and formulated the important aspects of the plan. I have edited the note to reflect any of my changes or salient points. I have personally discussed the plan with the patient and/or family.  Remains on NE 3.5 and milrinone 0.25. Co-ox 60%. Continues to diurese well. Over 4L out on lasix gtt at 20. Doubt weights are accurate. Swelling improved. Breathing better. Creatinine improving.  General:  Elderly. Sitting in chair  No resp difficulty HEENT: normal Neck: supple. JVP to ear. Carotids 2+ bilat; no bruits. No lymphadenopathy or thryomegaly appreciated. Cor: PMI nondisplaced. Regular rate & rhythm. No rubs, gallops or murmurs. Sternal incision ok Lungs: clear Abdomen: soft, nontender, nondistended. No hepatosplenomegaly. No bruits or masses. Good bowel sounds. Extremities: no cyanosis, clubbing, rash, 1-2+ edema + TEDs Neuro: alert & orientedx3, cranial nerves grossly intact. moves all 4 extremities w/o difficulty. Affect pleasant  Remains on dual pressors/inotropes. Diuresing well. Creatinine improving. Continue IV diuresis one more day. Once fully diuresed will wean NE followed by milrinone. Add midodrine 5 tid to support BP.   CRITICAL CARE Performed by: Glori Bickers  Total critical care time: 35 minutes  Critical care time was exclusive of separately billable procedures and treating other patients.  Critical care was necessary to treat or prevent imminent or  life-threatening deterioration.  Critical care was time spent personally by me (independent of midlevel providers or residents) on the following activities: development of treatment plan with patient and/or surrogate as well as nursing, discussions with consultants, evaluation of patient's response to treatment, examination of patient, obtaining history from patient or surrogate, ordering and performing treatments and interventions, ordering and review of laboratory studies, ordering and review of radiographic studies, pulse oximetry and re-evaluation of patient's condition.  Glori Bickers, MD  8:04 AM

## 2019-11-29 NOTE — Progress Notes (Signed)
EVENING ROUNDS NOTE :     [phone]       [place] [address]             [phone]                 7 Days Post-Op Procedure(s) (LRB): CORONARY ARTERY BYPASS GRAFTING (CABG), ON PUMP, TIMES FOUR , USING LEFT INTERNAL MAMMARY ARTERY AND ENDOSCOPICALLY HARVESTED RIGHT GREATER SAPHENOUS VEIN (N/A) AORTIC VALVE REPLACEMENT (AVR) USING INSPIRIS 23MM (N/A) TRANSESOPHAGEAL ECHOCARDIOGRAM (TEE) (N/A)   Total Length of Stay:  LOS: 14 days  Events:  No events Worked with PT today Up to chair this evening    BP (!) 104/55 (BP Location: Left Arm)   Pulse 80   Temp 97.6 F (36.4 C) (Oral)   Resp 16   Ht 5\' 11"  (1.803 m)   Wt 97.6 kg   SpO2 96%   BMI 30.01 kg/m   CVP:  [6 mmHg-12 mmHg] 7 mmHg     . sodium chloride    . sodium chloride Stopped (11/24/19 1458)  . furosemide (LASIX) infusion 20 mg/hr (11/29/19 1600)  . lactated ringers    . milrinone 0.25 mcg/kg/min (11/29/19 1600)  . nitroGLYCERIN    . norepinephrine (LEVOPHED) Adult infusion Stopped (11/29/19 0913)    I/O last 3 completed shifts: In: 1866.5 [P.O.:460; I.V.:1151.6; IV Piggyback:254.9] Out: 6105 [Urine:6105]   CBC Latest Ref Rng & Units 11/29/2019 11/28/2019 11/27/2019  WBC 4.0 - 10.5 K/uL 8.2 10.0 15.8(H)  Hemoglobin 13.0 - 17.0 g/dL 8.7(L) 8.9(L) 8.3(L)  Hematocrit 39 - 52 % 26.5(L) 26.5(L) 24.7(L)  Platelets 150 - 400 K/uL 179 165 132(L)    BMP Latest Ref Rng & Units 11/29/2019 11/28/2019 11/28/2019  Glucose 70 - 99 mg/dL 147(H) - 136(H)  BUN 8 - 23 mg/dL 43(H) - 44(H)  Creatinine 0.61 - 1.24 mg/dL 2.07(H) - 2.44(H)  BUN/Creat Ratio - - - -  Sodium 135 - 145 mmol/L 134(L) - 134(L)  Potassium 3.5 - 5.1 mmol/L 3.8 3.4(L) 3.0(L)  Chloride 98 - 111 mmol/L 98 - 100  CO2 22 - 32 mmol/L 24 - 21(L)  Calcium 8.9 - 10.3 mg/dL 8.0(L) - 7.7(L)    ABG    Component Value Date/Time   PHART 7.323 (L) 11/23/2019 0211   PCO2ART 35.5 11/23/2019 0211   PO2ART 78 (L) 11/23/2019 0211   HCO3 18.4  (L) 11/23/2019 0211   TCO2 19 (L) 11/23/2019 0211   ACIDBASEDEF 7.0 (H) 11/23/2019 0211   O2SAT 60.1 11/29/2019 0448       Melodie Bouillon, MD 11/29/2019 5:59 PM

## 2019-11-29 NOTE — Progress Notes (Signed)
Physical Therapy Treatment Patient Details Name: William Walker MRN: [id number] DOB: Oct 09, 1926 Today's Date: 11/29/2019    History of Present Illness 86 yo admitted with chest pain with NSTEMI with severe AS and CAD s/p CABG/AVR 6/21. PMHx:HTN, DM, CAD    PT Comments    Pt in chair on arrival stating he feels like a bloated frog. Pt with continued mod assist to rise from chair but able to progress gait with chair follow as well as perform HEP. Pt with decreased BP in standing but elevation after gait. Will continue to follow and encouraged gait with nursing as well. Pt needs cues to adhere to precautions with transfers and mod assist to scoot forward in chair.   HR 93-101 116/52 sitting Standing 94/47 (61)  3 min standing 84/51 (61) After gait 118/57 (73) 99% on RA   Follow Up Recommendations  CIR     Equipment Recommendations  None recommended by PT    Recommendations for Other Services       Precautions / Restrictions Precautions Precautions: Fall;Sternal Precaution Booklet Issued: No Precaution Comments: educated in sternal precautions during mobility    Mobility  Bed Mobility               General bed mobility comments: in chair on arrival and end of session  Transfers Overall transfer level: Needs assistance   Transfers: Sit to/from Stand Sit to Stand: Mod assist         General transfer comment: cues for hand placement, momentum and right knee blocked to stand from recliner with assist to rise and increased time to steady and place hands on RW x 2 trials  Ambulation/Gait Ambulation/Gait assistance: Min assist;+2 safety/equipment Gait Distance (Feet): 50 Feet Assistive device: Rolling walker (2 wheeled) Gait Pattern/deviations: Trunk flexed;Shuffle;Decreased stride length   Gait velocity interpretation: <1.8 ft/sec, indicate of risk for recurrent falls General Gait Details: cues for looking up and stepping into RW with slow shuffling gait with pt  able to appropriately judge activity tolerance with chair following and pt unable to attempt 2nd trial   Stairs             Wheelchair Mobility    Modified Rankin (Stroke Patients Only)       Balance Overall balance assessment: Needs assistance   Sitting balance-Leahy Scale: Fair       Standing balance-Leahy Scale: Poor Standing balance comment: bil UE support on RW in standing                            Cognition Arousal/Alertness: Awake/alert Behavior During Therapy: Flat affect Overall Cognitive Status: Impaired/Different from baseline Area of Impairment: Problem solving;Memory                     Memory: Decreased recall of precautions       Problem Solving: Slow processing General Comments: pt oriented to place and month not day. Pt HOH      Exercises General Exercises - Lower Extremity Long Arc Quad: AROM;Both;Seated;15 reps Hip Flexion/Marching: AROM;Both;Seated;15 reps    General Comments        Pertinent Vitals/Pain Pain Assessment: No/denies pain    Home Living                      Prior Function            PT Goals (current goals can now be found in the care  plan section) Progress towards PT goals: Progressing toward goals    Frequency    Min 3X/week      PT Plan Current plan remains appropriate    Co-evaluation              AM-PAC PT "6 Clicks" Mobility   Outcome Measure  Help needed turning from your back to your side while in a flat bed without using bedrails?: A Little Help needed moving from lying on your back to sitting on the side of a flat bed without using bedrails?: A Lot Help needed moving to and from a bed to a chair (including a wheelchair)?: A Lot Help needed standing up from a chair using your arms (e.g., wheelchair or bedside chair)?: A Lot Help needed to walk in hospital room?: A Little Help needed climbing 3-5 steps with a railing? : A Lot 6 Click Score: 14    End of  Session Equipment Utilized During Treatment: Gait belt Activity Tolerance: Patient tolerated treatment well Patient left: in chair;with call bell/phone within reach;with chair alarm set Nurse Communication: Mobility status;Precautions PT Visit Diagnosis: Other abnormalities of gait and mobility (R26.89);Difficulty in walking, not elsewhere classified (R26.2);Muscle weakness (generalized) (M62.81)     Time: 4707-6151 PT Time Calculation (min) (ACUTE ONLY): 28 min  Charges:  $Gait Training: 8-22 mins $Therapeutic Exercise: 8-22 mins                     [place], PT Acute Rehabilitation Services Pager: [phone] Office: [place] 11/29/2019, 12:23 PM

## 2019-11-30 ENCOUNTER — Inpatient Hospital Stay (HOSPITAL_COMMUNITY): Payer: Medicare Other

## 2019-11-30 DIAGNOSIS — I5021 Acute systolic (congestive) heart failure: Secondary | ICD-10-CM

## 2019-11-30 LAB — COMPREHENSIVE METABOLIC PANEL
ALT: 233 U/L — ABNORMAL HIGH (ref 0–44)
AST: 56 U/L — ABNORMAL HIGH (ref 15–41)
Albumin: 2.7 g/dL — ABNORMAL LOW (ref 3.5–5.0)
Alkaline Phosphatase: 73 U/L (ref 38–126)
Anion gap: 11 (ref 5–15)
BUN: 39 mg/dL — ABNORMAL HIGH (ref 8–23)
CO2: 27 mmol/L (ref 22–32)
Calcium: 8.2 mg/dL — ABNORMAL LOW (ref 8.9–10.3)
Chloride: 96 mmol/L — ABNORMAL LOW (ref 98–111)
Creatinine, Ser: 1.85 mg/dL — ABNORMAL HIGH (ref 0.61–1.24)
GFR calc Af Amer: 36 mL/min — ABNORMAL LOW (ref >60)
GFR calc non Af Amer: 31 mL/min — ABNORMAL LOW (ref >60)
Glucose, Bld: 138 mg/dL — ABNORMAL HIGH (ref 70–99)
Potassium: 3.3 mmol/L — ABNORMAL LOW (ref 3.5–5.1)
Sodium: 134 mmol/L — ABNORMAL LOW (ref 135–145)
Total Bilirubin: 1 mg/dL (ref 0.3–1.2)
Total Protein: 5 g/dL — ABNORMAL LOW (ref 6.5–8.1)

## 2019-11-30 LAB — CBC
HCT: 25.3 % — ABNORMAL LOW (ref 39.0–52.0)
Hemoglobin: 8.4 g/dL — ABNORMAL LOW (ref 13.0–17.0)
MCH: 32.9 pg (ref 26.0–34.0)
MCHC: 33.2 g/dL (ref 30.0–36.0)
MCV: 99.2 fL (ref 80.0–100.0)
Platelets: 184 10*3/uL (ref 150–400)
RBC: 2.55 MIL/uL — ABNORMAL LOW (ref 4.22–5.81)
RDW: 17.9 % — ABNORMAL HIGH (ref 11.5–15.5)
WBC: 6.2 10*3/uL (ref 4.0–10.5)
nRBC: 0 % (ref 0.0–0.2)

## 2019-11-30 LAB — COOXEMETRY PANEL
Carboxyhemoglobin: 1.4 % (ref 0.5–1.5)
Carboxyhemoglobin: 1.3 % (ref 0.5–1.5)
Carboxyhemoglobin: 1.2 % (ref 0.5–1.5)
Methemoglobin: 1.1 % (ref 0.0–1.5)
Methemoglobin: 0.5 % (ref 0.0–1.5)
Methemoglobin: 0.7 % (ref 0.0–1.5)
O2 Saturation: 44.9 %
O2 Saturation: 43.7 %
O2 Saturation: 52.3 %
Total hemoglobin: 11.2 g/dL — ABNORMAL LOW (ref 12.0–16.0)
Total hemoglobin: 8.9 g/dL — ABNORMAL LOW (ref 12.0–16.0)
Total hemoglobin: 9.3 g/dL — ABNORMAL LOW (ref 12.0–16.0)

## 2019-11-30 LAB — MAGNESIUM: Magnesium: 2.4 mg/dL (ref 1.7–2.4)

## 2019-11-30 LAB — GLUCOSE, CAPILLARY
Glucose-Capillary: 250 mg/dL — ABNORMAL HIGH (ref 70–99)
Glucose-Capillary: 288 mg/dL — ABNORMAL HIGH (ref 70–99)
Glucose-Capillary: 110 mg/dL — ABNORMAL HIGH (ref 70–99)
Glucose-Capillary: 197 mg/dL — ABNORMAL HIGH (ref 70–99)
Glucose-Capillary: 166 mg/dL — ABNORMAL HIGH (ref 70–99)

## 2019-11-30 LAB — ECHOCARDIOGRAM LIMITED
Height: 71 in
Weight: 3407.43 oz

## 2019-11-30 LAB — BASIC METABOLIC PANEL
Anion gap: 11 (ref 5–15)
BUN: 39 mg/dL — ABNORMAL HIGH (ref 8–23)
CO2: 27 mmol/L (ref 22–32)
Calcium: 8.1 mg/dL — ABNORMAL LOW (ref 8.9–10.3)
Chloride: 93 mmol/L — ABNORMAL LOW (ref 98–111)
Creatinine, Ser: 1.7 mg/dL — ABNORMAL HIGH (ref 0.61–1.24)
GFR calc Af Amer: 40 mL/min — ABNORMAL LOW (ref >60)
GFR calc non Af Amer: 34 mL/min — ABNORMAL LOW (ref >60)
Glucose, Bld: 217 mg/dL — ABNORMAL HIGH (ref 70–99)
Potassium: 3.3 mmol/L — ABNORMAL LOW (ref 3.5–5.1)
Sodium: 131 mmol/L — ABNORMAL LOW (ref 135–145)

## 2019-11-30 MED ORDER — POTASSIUM CHLORIDE CRYS ER 20 MEQ PO TBCR
40.0000 meq | EXTENDED_RELEASE_TABLET | Freq: Once | ORAL | Status: AC
  Administered 2019-11-30: 40 meq via ORAL
  Filled 2019-11-30: qty 2

## 2019-11-30 MED ORDER — POTASSIUM CHLORIDE CRYS ER 20 MEQ PO TBCR
20.0000 meq | EXTENDED_RELEASE_TABLET | ORAL | Status: AC
  Administered 2019-11-30 (×3): 20 meq via ORAL
  Filled 2019-11-30 (×3): qty 1

## 2019-11-30 MED ORDER — NOREPINEPHRINE 16 MG/250ML-% IV SOLN
3.0000 ug/min | INTRAVENOUS | Status: DC
  Administered 2019-11-30: 3 ug/min via INTRAVENOUS
  Filled 2019-11-30: qty 250

## 2019-11-30 NOTE — Progress Notes (Signed)
  Echocardiogram 2D Echocardiogram has been performed.  Johny Chess 11/30/2019, 1:41 PM

## 2019-11-30 NOTE — Plan of Care (Signed)
  Problem: Cardiovascular: Goal: Ability to achieve and maintain adequate cardiovascular perfusion will improve Outcome: Progressing   Problem: Safety: Goal: Ability to remain free from injury will improve Outcome: Progressing   Problem: Activity: Goal: Risk for activity intolerance will decrease Outcome: Progressing

## 2019-11-30 NOTE — Progress Notes (Signed)
Orthopedic Tech Progress Note Patient Details:  William Walker Oct 21, 1926 [id number] Unna boots applied under RLE incision.  Ortho Devices Type of Ortho Device: Haematologist Ortho Device/Splint Location: Bilateral Ortho Device/Splint Interventions: Ordered, Application   Post Interventions Patient Tolerated: Well Instructions Provided: [facility] [place] 11/30/2019, 3:02 PM

## 2019-11-30 NOTE — Progress Notes (Signed)
CT surgery p.m. Rounds  Patient comfortable eating dinner Echocardiogram performed earlier today shows good LV function and good aortic valve function Started on low-dose norepinephrine for drop in mixed venous saturation, urine output satisfactory Sinus rhythm with stable blood pressure Blood pressure 138/65, pulse 86, temperature 98.7 F (37.1 C), temperature source Oral, resp. rate 13, height 5\' 11"  (1.803 m), weight 96.6 kg, SpO2 97 %.

## 2019-11-30 NOTE — Progress Notes (Signed)
Physical Therapy Treatment Patient Details Name: William Walker MRN: [id number] DOB: [birth year] Today's Date: 11/30/2019    History of Present Illness 86 yo admitted with chest pain with NSTEMI with severe AS and CAD s/p CABG/AVR 6/21. PMHx:HTN, DM, CAD    PT Comments    Pt pleasant sitting in chair and reports having walked with nursing this morning. Pt  Continues to struggle with precaution recall. Pt slowly improving with gait with daughter present end of session. DC rec remains appropriate. Will continue to follow and HEP encouraged.  Pre 114/58 (70) Post 136/64 (86) HR 85 SpO2 92%RA   Follow Up Recommendations  CIR     Equipment Recommendations  None recommended by PT    Recommendations for Other Services       Precautions / Restrictions Precautions Precautions: Fall;Sternal Precaution Comments: educated in sternal precautions during mobility    Mobility  Bed Mobility Overal bed mobility: Needs Assistance Bed Mobility: Sit to Supine       Sit to supine: Mod assist   General bed mobility comments: assist to elevate legs on surface, cues for sequence and precautions  Transfers Overall transfer level: Needs assistance   Transfers: Sit to/from Stand Sit to Stand: Mod assist         General transfer comment: cues for hand placement, momentum and right knee blocked to stand from recliner with assist to rise and increased time to steady and place hands on RW  Ambulation/Gait Ambulation/Gait assistance: Min assist Gait Distance (Feet): 68 Feet Assistive device: Rolling walker (2 wheeled) Gait Pattern/deviations: Trunk flexed;Shuffle;Decreased stride length   Gait velocity interpretation: <1.8 ft/sec, indicate of risk for recurrent falls General Gait Details: cues for looking up and stepping into RW with slow shuffling gait with pt able to appropriately judge activity tolerance, limited by fatigue   Stairs             Wheelchair Mobility     Modified Rankin (Stroke Patients Only)       Balance Overall balance assessment: Needs assistance   Sitting balance-Leahy Scale: Fair Sitting balance - Comments: EOB with guarding     Standing balance-Leahy Scale: Poor Standing balance comment: bil UE support on RW in standing                            Cognition Arousal/Alertness: Awake/alert Behavior During Therapy: Flat affect Overall Cognitive Status: Impaired/Different from baseline                       Memory: Decreased recall of precautions       Problem Solving: Slow processing        Exercises General Exercises - Lower Extremity Heel Slides: AROM;Both;Supine;15 reps Hip ABduction/ADduction: AROM;Both;10 reps;Supine    General Comments        Pertinent Vitals/Pain Pain Assessment: No/denies pain    Home Living                      Prior Function            PT Goals (current goals can now be found in the care plan section) Progress towards PT goals: Progressing toward goals    Frequency    Min 3X/week      PT Plan Current plan remains appropriate    Co-evaluation              AM-PAC PT "6 Clicks" Mobility  Outcome Measure  Help needed turning from your back to your side while in a flat bed without using bedrails?: A Little Help needed moving from lying on your back to sitting on the side of a flat bed without using bedrails?: A Lot Help needed moving to and from a bed to a chair (including a wheelchair)?: A Lot Help needed standing up from a chair using your arms (e.g., wheelchair or bedside chair)?: A Lot Help needed to walk in hospital room?: A Little Help needed climbing 3-5 steps with a railing? : Total 6 Click Score: 13    End of Session Equipment Utilized During Treatment: Gait belt Activity Tolerance: Patient tolerated treatment well Patient left: in bed;with call bell/phone within reach;with family/visitor present Nurse Communication:  Mobility status;Precautions PT Visit Diagnosis: Other abnormalities of gait and mobility (R26.89);Difficulty in walking, not elsewhere classified (R26.2);Muscle weakness (generalized) (M62.81)     Time: 5176-1607 PT Time Calculation (min) (ACUTE ONLY): 24 min  Charges:  $Gait Training: 8-22 mins $Therapeutic Exercise: 8-22 mins                     [place], PT Acute Rehabilitation Services Pager: [phone] Office: [place] 11/30/2019, 12:09 PM

## 2019-11-30 NOTE — Progress Notes (Signed)
Orthopedic Tech Progress Note Patient Details:  William Walker 03-07-1927 [id number] Called RN and asked to dress wound on leg and order two 4 in cobands in preparation for unna boot application. Patient ID: William Walker, male   DOB: September 23, 1926, 86 y.o.   MRN: [id number]   [name] 11/30/2019, 9:13 AM

## 2019-11-30 NOTE — Progress Notes (Addendum)
Patient ID: William Walker, male   DOB: 10-31-1926, 86 y.o.   MRN: [id number]     Advanced Heart Failure Rounding Note  PCP-Cardiologist: [name], MD  AHF: Dr. Haroldine Laws    Patient Profile   86 y/o male with HTN, DM, CAD admitted with NSTEMI found to have severe 3v CAD and severe AS with normal EF.   Underwent CABG/AVR 11/22/19.  AHF consulted for post-cardiotomy shock.   Subjective:    Continues on Lasix gtt 20 mg/hr and milrinone 0.25. NE turned off yesterday and Co-ox low at 44% today (confirmed by repeat).   -3.2L in UOP yesterday. Remains edematous. Still +12 lb above preop wt. CVP 10.   Renal function continues to improve. Creatinine 2.92 => 2.76 => 2.44=>2.07=>1.85  OOB sitting in chair. Just ambulated the unit. Feels tired. No CP or dyspnea.     Objective:   Weight Range: 96.6 kg Body mass index is 29.7 kg/m.   Vital Signs:   Temp:  [97.5 F (36.4 C)-98.9 F (37.2 C)] 98.9 F (37.2 C) (06/29 0745) Pulse Rate:  [37-92] 82 (06/29 0700) Resp:  [12-21] 21 (06/29 0700) BP: (92-124)/(46-103) 102/59 (06/29 0700) SpO2:  [86 %-97 %] 96 % (06/29 0700) Weight:  [96.6 kg] 96.6 kg (06/29 0500) Last BM Date: 11/28/19      Weight change: Filed Weights   11/28/19 0500 11/29/19 0600 11/30/19 0500  Weight: 96.6 kg 97.6 kg 96.6 kg    Intake/Output:   Intake/Output Summary (Last 24 hours) at 11/30/2019 0804 Last data filed at 11/30/2019 0700 Gross per 24 hour  Intake 631.27 ml  Output 2975 ml  Net -2343.73 ml      Physical Exam   PHYSICAL EXAM: CVP 10 General:  Elderly WM sitting up in chair. No respiratory difficulty HEENT: normal Neck: supple. JVD ~10 cm. Carotids 2+ bilat; no bruits. No lymphadenopathy or thyromegaly appreciated. Cor: PMI nondisplaced. Regular rate & rhythm. No rubs, gallops or murmurs. Sternotomy site ok.  Lungs: decreased BS at the bases, no wheezing   Abdomen: soft, nontender, nondistended. No hepatosplenomegaly. No bruits or  masses. Good bowel sounds. Extremities: no cyanosis, clubbing, rash, 2+ bilateral LEE up to knees Neuro: alert & oriented x 3, cranial nerves grossly intact. moves all 4 extremities w/o difficulty. Affect pleasant.    Telemetry   NSR 80s Personally reviewed  Labs    CBC Recent Labs    11/29/19 0523 11/30/19 0515  WBC 8.2 6.2  HGB 8.7* 8.4*  HCT 26.5* 25.3*  MCV 98.5 99.2  PLT 179 409   Basic Metabolic Panel Recent Labs    11/29/19 0523 11/30/19 0515  NA 134* 134*  K 3.8 3.3*  CL 98 96*  CO2 24 27  GLUCOSE 147* 138*  BUN 43* 39*  CREATININE 2.07* 1.85*  CALCIUM 8.0* 8.2*  MG  --  2.4   Liver Function Tests Recent Labs    11/30/19 0515  AST 56*  ALT 233*  ALKPHOS 73  BILITOT 1.0  PROT 5.0*  ALBUMIN 2.7*   No results for input(s): LIPASE, AMYLASE in the last 72 hours. Cardiac Enzymes No results for input(s): CKTOTAL, CKMB, CKMBINDEX, TROPONINI in the last 72 hours.  BNP: BNP (last 3 results) Recent Labs    11/15/19 1947  BNP 606.3*    ProBNP (last 3 results) No results for input(s): PROBNP in the last 8760 hours.   D-Dimer No results for input(s): DDIMER in the last 72 hours. Hemoglobin A1C No results for  input(s): HGBA1C in the last 72 hours. Fasting Lipid Panel No results for input(s): CHOL, HDL, LDLCALC, TRIG, CHOLHDL, LDLDIRECT in the last 72 hours. Thyroid Function Tests No results for input(s): TSH, T4TOTAL, T3FREE, THYROIDAB in the last 72 hours.  Invalid input(s): FREET3  Other results:   Imaging    No results found.   Medications:     Scheduled Medications: . aspirin EC  325 mg Oral Daily   Or  . aspirin  324 mg Per Tube Daily  . bisacodyl  10 mg Oral Daily   Or  . bisacodyl  10 mg Rectal Daily  . Chlorhexidine Gluconate Cloth  6 each Topical Daily  . docusate sodium  200 mg Oral Daily  . enoxaparin (LOVENOX) injection  30 mg Subcutaneous Q24H  . feeding supplement (ENSURE ENLIVE)  237 mL Oral BID BM  . insulin  aspart  0-24 Units Subcutaneous TID AC & HS  . insulin detemir  16 Units Subcutaneous Daily  . levothyroxine  50 mcg Oral QAC breakfast  . mouth rinse  15 mL Mouth Rinse BID  . midodrine  5 mg Oral TID WC  . multivitamin with minerals  1 tablet Oral Daily  . pantoprazole  40 mg Oral Daily  . potassium chloride  20 mEq Oral Q4H  . sodium chloride flush  10-40 mL Intracatheter Q12H  . sodium chloride flush  3 mL Intravenous Q12H    Infusions: . sodium chloride    . sodium chloride Stopped (11/24/19 1458)  . furosemide (LASIX) infusion 20 mg/hr (11/30/19 0700)  . lactated ringers    . milrinone 0.25 mcg/kg/min (11/30/19 0700)  . nitroGLYCERIN    . norepinephrine (LEVOPHED) Adult infusion Stopped (11/29/19 0913)    PRN Medications: dextrose, metoprolol tartrate, morphine injection, ondansetron (ZOFRAN) IV, oxyCODONE, sodium chloride flush, sodium chloride flush, traMADol     Assessment/Plan   1. Severe Multivessel CAD + critical AS - 11/22/2019 S/P CABG x 4 + bioprosthetic AVR - doing well. No s/s ischemia - continue ASA  - statin on hold for elevated LFTs (downtrending) - b-blocker on hold due to shock  2. Post Cardiotomy Shock  - ECHO EF 50-55% intraoperative.  - Initial CO-OX post op 41% on Neo, Norepi, and Dopamine. Milrinone added, increased to 0.25 on 6/25.  - Remains on milrinone 0.25. Co-ox down to 44% after NE discontinued.  - Restart low dose NE 3 mcg/min for added inotropic support. Continue midodrine 5 mg tid for BP support  - Once fully diuresed will wean NE followed by milrinone - Diuresing well on current Lasix gtt but still volume overloaded. CVP 10 and edematous.   - Continue lasix gtt at 20/hr. Consider dose of metolazone. - Add unna boots   3. Anemia , expected blood loss.  - Hgb stable 8.4 today  3. AKI due to post-op ATN - Lower creatinine today 2.92 => 2.76 => 2.44=>2.07=>1.85  4. DMII - On SSI3  5. Hypothyroidism - On synthroid 50 mcg.  -  TSH normal   6. Muscle weakness. - PT following. CIR consulted   7. Shock liver - LFTs continue to fall.   8. ID - WBC have normalized. AF - Zosyn completed 6/26  9. Hypokalemia - 3.3 today.  - Will supp w/ KCl w/ diuresis  - Discussed dosing with PharmD personally.   Brittainy Simmons PA-C 11/30/2019 8:04 AM   Agree with above.  NE weaned off yesterday and co-ox dropped to 44% despite milrinone and addition of  midodrine. Still markedly edematous. Weight up 9 pounds. Fortunately renal function improving.   General:  Elderly. Sitting in chair No resp difficulty HEENT: normal Neck: supple. JVP to jaw Carotids 2+ bilat; no bruits. No lymphadenopathy or thryomegaly appreciated. DPT:ELMRAJH incision  Ok  PMI nondisplaced. Regular rate & rhythm. No rubs, gallops or murmurs. Lungs: clear Abdomen: soft, nontender, nondistended. No hepatosplenomegaly. No bruits or masses. Good bowel sounds. Extremities: no cyanosis, clubbing, rash, 2+ edema Neuro: alert & orientedx3, cranial nerves grossly intact. moves all 4 extremities w/o difficulty. Affect pleasant  Co-ox dropped with weaning of low-dose NE. Will restart to facilitate diuresis. Get repeat echo to look at RV/LV function. Place UNNA boots.   CRITICAL CARE Performed by: Glori Bickers  Total critical care time: 35 minutes  Critical care time was exclusive of separately billable procedures and treating other patients.  Critical care was necessary to treat or prevent imminent or life-threatening deterioration.  Critical care was time spent personally by me (independent of midlevel providers or residents) on the following activities: development of treatment plan with patient and/or surrogate as well as nursing, discussions with consultants, evaluation of patient's response to treatment, examination of patient, obtaining history from patient or surrogate, ordering and performing treatments and interventions, ordering and review of  laboratory studies, ordering and review of radiographic studies, pulse oximetry and re-evaluation of patient's condition.  Glori Bickers, MD  8:34 AM

## 2019-11-30 NOTE — Progress Notes (Signed)
Patient ID: William Walker, male   DOB: Apr 18, 1927, 86 y.o.   MRN: [id number] [facility] DAILY ICU PROGRESS NOTE                   [address]            [place] [address]          [phone]   8 Days Post-Op Procedure(s) (LRB): CORONARY ARTERY BYPASS GRAFTING (CABG), ON PUMP, TIMES FOUR , USING LEFT INTERNAL MAMMARY ARTERY AND ENDOSCOPICALLY HARVESTED RIGHT GREATER SAPHENOUS VEIN (N/A) AORTIC VALVE REPLACEMENT (AVR) USING INSPIRIS 23MM (N/A) TRANSESOPHAGEAL ECHOCARDIOGRAM (TEE) (N/A)  Total Length of Stay:  LOS: 15 days   Subjective: Up in chair , alert  Neuro intact  Objective: Vital signs in last 24 hours: Temp:  [97.5 F (36.4 C)-98.1 F (36.7 C)] 97.7 F (36.5 C) (06/29 0400) Pulse Rate:  [37-92] 82 (06/29 0700) Cardiac Rhythm: Normal sinus rhythm (06/29 0400) Resp:  [12-21] 21 (06/29 0700) BP: (92-124)/(46-103) 102/59 (06/29 0700) SpO2:  [86 %-99 %] 96 % (06/29 0700) Weight:  [96.6 kg] 96.6 kg (06/29 0500)  Filed Weights   11/28/19 0500 11/29/19 0600 11/30/19 0500  Weight: 96.6 kg 97.6 kg 96.6 kg    Weight change: -1 kg   Hemodynamic parameters for last 24 hours:    Intake/Output from previous day: 06/28 0701 - 06/29 0700 In: 661.7 [I.V.:661.7] Out: 3225 [Urine:3225]  Intake/Output this shift: No intake/output data recorded.  Current Meds: Scheduled Meds: . aspirin EC  325 mg Oral Daily   Or  . aspirin  324 mg Per Tube Daily  . bisacodyl  10 mg Oral Daily   Or  . bisacodyl  10 mg Rectal Daily  . Chlorhexidine Gluconate Cloth  6 each Topical Daily  . docusate sodium  200 mg Oral Daily  . enoxaparin (LOVENOX) injection  30 mg Subcutaneous Q24H  . feeding supplement (ENSURE ENLIVE)  237 mL Oral BID BM  . insulin aspart  0-24 Units Subcutaneous TID AC & HS  . insulin detemir  16 Units Subcutaneous Daily  . levothyroxine  50 mcg Oral QAC breakfast  . mouth rinse  15 mL Mouth Rinse BID  . midodrine  5 mg Oral TID WC  . multivitamin with  minerals  1 tablet Oral Daily  . pantoprazole  40 mg Oral Daily  . potassium chloride  20 mEq Oral Q4H  . sodium chloride flush  10-40 mL Intracatheter Q12H  . sodium chloride flush  3 mL Intravenous Q12H   Continuous Infusions: . sodium chloride    . sodium chloride Stopped (11/24/19 1458)  . furosemide (LASIX) infusion 20 mg/hr (11/30/19 0700)  . lactated ringers    . milrinone 0.25 mcg/kg/min (11/30/19 0700)  . nitroGLYCERIN    . norepinephrine (LEVOPHED) Adult infusion Stopped (11/29/19 0913)   PRN Meds:.dextrose, metoprolol tartrate, morphine injection, ondansetron (ZOFRAN) IV, oxyCODONE, sodium chloride flush, sodium chloride flush, traMADol  General appearance: alert, cooperative and no distress Neurologic: intact Heart: regular rate and rhythm, S1, S2 normal, no murmur, click, rub or gallop Lungs: clear to auscultation bilaterally Abdomen: soft, non-tender; bowel sounds normal; no masses,  no organomegaly Extremities: extremities normal, atraumatic, no cyanosis or edema and Homans sign is negative, no sign of DVT Wound: sternal incision stable   Lab Results: CBC: Recent Labs    11/29/19 0523 11/30/19 0515  WBC 8.2 6.2  HGB 8.7* 8.4*  HCT 26.5* 25.3*  PLT 179 184  BMET:  Recent Labs    11/29/19 0523 11/30/19 0515  NA 134* 134*  K 3.8 3.3*  CL 98 96*  CO2 24 27  GLUCOSE 147* 138*  BUN 43* 39*  CREATININE 2.07* 1.85*  CALCIUM 8.0* 8.2*    CMET: Lab Results  Component Value Date   WBC 6.2 11/30/2019   HGB 8.4 (L) 11/30/2019   HCT 25.3 (L) 11/30/2019   PLT 184 11/30/2019   GLUCOSE 138 (H) 11/30/2019   CHOL 166 11/16/2019   TRIG 88 11/16/2019   HDL 47 11/16/2019   LDLCALC 101 (H) 11/16/2019   ALT 233 (H) 11/30/2019   AST 56 (H) 11/30/2019   NA 134 (L) 11/30/2019   K 3.3 (L) 11/30/2019   CL 96 (L) 11/30/2019   CREATININE 1.85 (H) 11/30/2019   BUN 39 (H) 11/30/2019   CO2 27 11/30/2019   TSH 1.349 11/24/2019   INR 1.7 (H) 11/22/2019   HGBA1C 7.4  (H) 11/15/2019      PT/INR: No results for input(s): LABPROT, INR in the last 72 hours. Radiology: No results found.   Assessment/Plan: S/P Procedure(s) (LRB): CORONARY ARTERY BYPASS GRAFTING (CABG), ON PUMP, TIMES FOUR , USING LEFT INTERNAL MAMMARY ARTERY AND ENDOSCOPICALLY HARVESTED RIGHT GREATER SAPHENOUS VEIN (N/A) AORTIC VALVE REPLACEMENT (AVR) USING INSPIRIS 23MM (N/A) TRANSESOPHAGEAL ECHOCARDIOGRAM (TEE) (N/A) Renal functional improving I<O  2.5 l 24 hours  Titration drips per HF team, consider decreasing lasix drip / convert to intermediate , wean milrinone  D/c foley      William Walker 11/30/2019 7:37 AM

## 2019-12-01 ENCOUNTER — Inpatient Hospital Stay (HOSPITAL_COMMUNITY): Payer: Medicare Other

## 2019-12-01 LAB — GLUCOSE, CAPILLARY
Glucose-Capillary: 139 mg/dL — ABNORMAL HIGH (ref 70–99)
Glucose-Capillary: 273 mg/dL — ABNORMAL HIGH (ref 70–99)
Glucose-Capillary: 113 mg/dL — ABNORMAL HIGH (ref 70–99)
Glucose-Capillary: 156 mg/dL — ABNORMAL HIGH (ref 70–99)

## 2019-12-01 LAB — COMPREHENSIVE METABOLIC PANEL
ALT: 181 U/L — ABNORMAL HIGH (ref 0–44)
AST: 57 U/L — ABNORMAL HIGH (ref 15–41)
Albumin: 2.8 g/dL — ABNORMAL LOW (ref 3.5–5.0)
Alkaline Phosphatase: 74 U/L (ref 38–126)
Anion gap: 10 (ref 5–15)
BUN: 33 mg/dL — ABNORMAL HIGH (ref 8–23)
CO2: 29 mmol/L (ref 22–32)
Calcium: 8.3 mg/dL — ABNORMAL LOW (ref 8.9–10.3)
Chloride: 94 mmol/L — ABNORMAL LOW (ref 98–111)
Creatinine, Ser: 1.74 mg/dL — ABNORMAL HIGH (ref 0.61–1.24)
GFR calc Af Amer: 39 mL/min — ABNORMAL LOW (ref >60)
GFR calc non Af Amer: 34 mL/min — ABNORMAL LOW (ref >60)
Glucose, Bld: 135 mg/dL — ABNORMAL HIGH (ref 70–99)
Potassium: 3.6 mmol/L (ref 3.5–5.1)
Sodium: 133 mmol/L — ABNORMAL LOW (ref 135–145)
Total Bilirubin: 1 mg/dL (ref 0.3–1.2)
Total Protein: 5.1 g/dL — ABNORMAL LOW (ref 6.5–8.1)

## 2019-12-01 LAB — COOXEMETRY PANEL
Carboxyhemoglobin: 1.2 % (ref 0.5–1.5)
Carboxyhemoglobin: 1.1 % (ref 0.5–1.5)
Methemoglobin: 0.7 % (ref 0.0–1.5)
Methemoglobin: 0.6 % (ref 0.0–1.5)
O2 Saturation: 40.4 %
O2 Saturation: 42.6 %
Total hemoglobin: 9.9 g/dL — ABNORMAL LOW (ref 12.0–16.0)
Total hemoglobin: 10.7 g/dL — ABNORMAL LOW (ref 12.0–16.0)

## 2019-12-01 LAB — CBC
HCT: 27.5 % — ABNORMAL LOW (ref 39.0–52.0)
Hemoglobin: 9.1 g/dL — ABNORMAL LOW (ref 13.0–17.0)
MCH: 32.4 pg (ref 26.0–34.0)
MCHC: 33.1 g/dL (ref 30.0–36.0)
MCV: 97.9 fL (ref 80.0–100.0)
Platelets: 231 10*3/uL (ref 150–400)
RBC: 2.81 MIL/uL — ABNORMAL LOW (ref 4.22–5.81)
RDW: 17.5 % — ABNORMAL HIGH (ref 11.5–15.5)
WBC: 7.5 10*3/uL (ref 4.0–10.5)
nRBC: 0 % (ref 0.0–0.2)

## 2019-12-01 MED ORDER — BOOST PLUS PO LIQD
237.0000 mL | Freq: Three times a day (TID) | ORAL | Status: DC
  Administered 2019-12-02: 237 mL via ORAL
  Filled 2019-12-01 (×4): qty 237

## 2019-12-01 MED ORDER — BOOST / RESOURCE BREEZE PO LIQD CUSTOM: 237.0000 mL | Freq: Two times a day (BID) | ORAL | Status: DC

## 2019-12-01 MED ORDER — TORSEMIDE 20 MG PO TABS
40.0000 mg | ORAL_TABLET | Freq: Two times a day (BID) | ORAL | Status: DC
  Administered 2019-12-01: 40 mg via ORAL
  Filled 2019-12-01: qty 2

## 2019-12-01 MED ORDER — POTASSIUM CHLORIDE CRYS ER 20 MEQ PO TBCR
40.0000 meq | EXTENDED_RELEASE_TABLET | Freq: Once | ORAL | Status: AC
  Administered 2019-12-01: 40 meq via ORAL
  Filled 2019-12-01: qty 2

## 2019-12-01 NOTE — Progress Notes (Addendum)
Patient ID: William Walker, male   DOB: 1926-11-25, 86 y.o.   MRN: [id number]     Advanced Heart Failure Rounding Note  PCP-Cardiologist: [name], MD  AHF: Dr. Haroldine Laws    Patient Profile   86 y/o male with HTN, DM, CAD admitted with NSTEMI found to have severe 3v CAD and severe AS with normal EF.   Underwent CABG/AVR 11/22/19.  AHF consulted for post-cardiotomy shock.   Subjective:    Continues on Lasix gtt 20 mg/hr and milrinone 0.25 + norepi 3 mcg. CO-OX 40% --repeat now.   Negative 2.5 liters. Weight down 3 pounds.   Renal function continues to improve. Creatinine 2.92 => 2.76 => 2.44=>2.07=>1.85=>1.7   Able to walk around the unit this morning. Denies SOB.    Objective:   Weight Range: 95.1 kg Body mass index is 29.24 kg/m.   Vital Signs:   Temp:  [98 F (36.7 C)-98.9 F (37.2 C)] 98.3 F (36.8 C) (06/30 0400) Pulse Rate:  [80-95] 89 (06/30 0700) Resp:  [11-26] 22 (06/30 0700) BP: (93-138)/(48-71) 113/56 (06/30 0700) SpO2:  [95 %-100 %] 100 % (06/30 0700) Weight:  [95.1 kg] 95.1 kg (06/30 0545) Last BM Date: 11/28/19  CVP:  [5 mmHg-13 mmHg] 5 mmHg   Weight change: Filed Weights   11/29/19 0600 11/30/19 0500 12/01/19 0545  Weight: 97.6 kg 96.6 kg 95.1 kg    Intake/Output:   Intake/Output Summary (Last 24 hours) at 12/01/2019 0710 Last data filed at 12/01/2019 0400 Gross per 24 hour  Intake 741.24 ml  Output 3325 ml  Net -2583.76 ml      Physical Exam  CVP 5-6 PHYSICAL EXAM: General:  Sitting in the chair. No resp difficulty HEENT: normal Neck: supple. no JVD. Carotids 2+ bilat; no bruits. No lymphadenopathy or thryomegaly appreciated. Cor: PMI nondisplaced. Regular rate & rhythm. No rubs, gallops or murmurs. Sternal incision approximated. Lungs: clear Abdomen: soft, nontender, nondistended. No hepatosplenomegaly. No bruits or masses. Good bowel sounds. Extremities: no cyanosis, clubbing, rash, R and LLE unna boots. RUE PICC  Neuro:  alert & orientedx3, cranial nerves grossly intact. moves all 4 extremities w/o difficulty. Affect pleasant    Telemetry   NSR 80s Personally reviewed  Labs    CBC Recent Labs    11/30/19 0515 12/01/19 0433  WBC 6.2 7.5  HGB 8.4* 9.1*  HCT 25.3* 27.5*  MCV 99.2 97.9  PLT 184 712   Basic Metabolic Panel Recent Labs    11/30/19 0515 11/30/19 0515 11/30/19 1533 12/01/19 0433  NA 134*   < > 131* 133*  K 3.3*   < > 3.3* 3.6  CL 96*   < > 93* 94*  CO2 27   < > 27 29  GLUCOSE 138*   < > 217* 135*  BUN 39*   < > 39* 33*  CREATININE 1.85*   < > 1.70* 1.74*  CALCIUM 8.2*   < > 8.1* 8.3*  MG 2.4  --   --   --    < > = values in this interval not displayed.   Liver Function Tests Recent Labs    11/30/19 0515 12/01/19 0433  AST 56* 57*  ALT 233* 181*  ALKPHOS 73 74  BILITOT 1.0 1.0  PROT 5.0* 5.1*  ALBUMIN 2.7* 2.8*   No results for input(s): LIPASE, AMYLASE in the last 72 hours. Cardiac Enzymes No results for input(s): CKTOTAL, CKMB, CKMBINDEX, TROPONINI in the last 72 hours.  BNP: BNP (last 3 results)  Recent Labs    11/15/19 1947  BNP 606.3*    ProBNP (last 3 results) No results for input(s): PROBNP in the last 8760 hours.   D-Dimer No results for input(s): DDIMER in the last 72 hours. Hemoglobin A1C No results for input(s): HGBA1C in the last 72 hours. Fasting Lipid Panel No results for input(s): CHOL, HDL, LDLCALC, TRIG, CHOLHDL, LDLDIRECT in the last 72 hours. Thyroid Function Tests No results for input(s): TSH, T4TOTAL, T3FREE, THYROIDAB in the last 72 hours.  Invalid input(s): FREET3  Other results:   Imaging    ECHOCARDIOGRAM LIMITED  Result Date: 11/30/2019    ECHOCARDIOGRAM LIMITED REPORT   Patient Name:   William Walker Date of Exam: 11/30/2019 Medical Rec #:  [id number]     Height:       71.0 in Accession #:    [id number]    Weight:       213.0 lb Date of Birth:  09/23/1926      BSA:          2.166 m Patient Age:    81 years      BP:            130/71 mmHg Patient Gender: M             HR:           86 bpm. Exam Location:  Inpatient Procedure: Limited Echo, Limited Color Doppler and Cardiac Doppler Indications:    acute systolic chf 542.70  History:        Patient has prior history of Echocardiogram examinations, most                 recent 11/16/2019. CAD.                 Aortic Valve: bioprosthetic valve is present in the aortic                 position.  Sonographer:    Johny Chess Referring Phys: [place]  1. Left ventricular ejection fraction, by estimation, is 60 to 65%. The left ventricle has normal function. The left ventricle has no regional wall motion abnormalities. Left ventricular diastolic parameters are consistent with Grade II diastolic dysfunction (pseudonormalization). Elevated left atrial pressure.  2. Right ventricular systolic function was not well visualized. The right ventricular size is normal. There is mildly elevated pulmonary artery systolic pressure. The estimated right ventricular systolic pressure is 62.3 mmHg.  3. The mitral valve is normal in structure. Trivial mitral valve regurgitation.  4. There is no perivalvular leak. The aortic valve has been repaired/replaced. Aortic valve regurgitation is mild. There is a bioprosthetic valve present in the aortic position. Echo findings are consistent with regurgitation of the aortic prosthesis. Comparison(s): Prior images reviewed side by side. The left ventricular function has improved. The left ventricular wall motion has improved. The right ventricle is poorly seen on this study and its systolic function cannot be assessed. FINDINGS  Left Ventricle: Left ventricular ejection fraction, by estimation, is 60 to 65%. The left ventricle has normal function. The left ventricle has no regional wall motion abnormalities. The left ventricular internal cavity size was normal in size. There is  no left ventricular hypertrophy. Abnormal (paradoxical)  septal motion consistent with post-operative status. Elevated left atrial pressure. Right Ventricle: The right ventricular size is normal. No increase in right ventricular wall thickness. Right ventricular systolic function was not well visualized. There is mildly elevated pulmonary artery  systolic pressure. The tricuspid regurgitant velocity is 2.89 m/s, and with an assumed right atrial pressure of 10 mmHg, the estimated right ventricular systolic pressure is 46.2 mmHg. Left Atrium: Left atrial size was normal in size. Right Atrium: Right atrial size was normal in size. Pericardium: There is no evidence of pericardial effusion. Mitral Valve: The mitral valve is normal in structure. Trivial mitral valve regurgitation. Tricuspid Valve: The tricuspid valve is normal in structure. Tricuspid valve regurgitation is mild. Aortic Valve: There is no perivalvular leak. The aortic valve has been repaired/replaced. Aortic valve regurgitation is mild. Aortic valve mean gradient measures 7.0 mmHg. Aortic valve peak gradient measures 15.7 mmHg. Aortic valve area, by VTI measures 1.06 cm. There is a bioprosthetic valve present in the aortic position. Pulmonic Valve: The pulmonic valve was not well visualized. Pulmonic valve regurgitation is not visualized. Aorta: The aortic root is normal in size and structure. Venous: The inferior vena cava was not well visualized. IAS/Shunts: No atrial level shunt detected by color flow Doppler.  LEFT VENTRICLE PLAX 2D LVIDd:         4.60 cm     Diastology LVIDs:         3.00 cm     LV e' lateral:   6.74 cm/s LV PW:         1.10 cm     LV E/e' lateral: 13.9 LV IVS:        1.00 cm     LV e' medial:    4.35 cm/s LVOT diam:     1.50 cm     LV E/e' medial:  21.5 LV SV:         28 LV SV Index:   13 LVOT Area:     1.77 cm  LV Volumes (MOD) LV vol d, MOD A2C: 69.5 ml LV vol d, MOD A4C: 62.6 ml LV vol s, MOD A2C: 33.0 ml LV vol s, MOD A4C: 33.0 ml LV SV MOD A2C:     36.5 ml LV SV MOD A4C:     62.6 ml  LV SV MOD BP:      34.1 ml LEFT ATRIUM         Index LA diam:    3.20 cm 1.48 cm/m  AORTIC VALVE AV Area (Vmax):    0.84 cm AV Area (Vmean):   0.83 cm AV Area (VTI):     1.06 cm AV Vmax:           198.00 cm/s AV Vmean:          120.000 cm/s AV VTI:            0.264 m AV Peak Grad:      15.7 mmHg AV Mean Grad:      7.0 mmHg LVOT Vmax:         94.20 cm/s LVOT Vmean:        56.700 cm/s LVOT VTI:          0.158 m LVOT/AV VTI ratio: 0.60 MITRAL VALVE               TRICUSPID VALVE MV Area (PHT): 3.53 cm    TR Peak grad:   33.4 mmHg MV Decel Time: 215 msec    TR Vmax:        289.00 cm/s MV E velocity: 93.60 cm/s MV A velocity: 83.20 cm/s  SHUNTS MV E/A ratio:  1.12        Systemic VTI:  0.16 m  Systemic Diam: 1.50 cm Sanda Klein MD Electronically signed by Sanda Klein MD Signature Date/Time: 11/30/2019/2:00:40 PM    Final      Medications:     Scheduled Medications: . aspirin EC  325 mg Oral Daily   Or  . aspirin  324 mg Per Tube Daily  . bisacodyl  10 mg Oral Daily   Or  . bisacodyl  10 mg Rectal Daily  . Chlorhexidine Gluconate Cloth  6 each Topical Daily  . docusate sodium  200 mg Oral Daily  . enoxaparin (LOVENOX) injection  30 mg Subcutaneous Q24H  . feeding supplement (ENSURE ENLIVE)  237 mL Oral BID BM  . insulin aspart  0-24 Units Subcutaneous TID AC & HS  . insulin detemir  16 Units Subcutaneous Daily  . levothyroxine  50 mcg Oral QAC breakfast  . mouth rinse  15 mL Mouth Rinse BID  . midodrine  5 mg Oral TID WC  . multivitamin with minerals  1 tablet Oral Daily  . pantoprazole  40 mg Oral Daily  . sodium chloride flush  10-40 mL Intracatheter Q12H  . sodium chloride flush  3 mL Intravenous Q12H    Infusions: . sodium chloride    . sodium chloride Stopped (11/24/19 1458)  . furosemide (LASIX) infusion 20 mg/hr (12/01/19 0400)  . lactated ringers    . milrinone 0.25 mcg/kg/min (12/01/19 0400)  . norepinephrine (LEVOPHED) Adult infusion 3 mcg/min  (12/01/19 0400)    PRN Medications: dextrose, metoprolol tartrate, morphine injection, ondansetron (ZOFRAN) IV, oxyCODONE, sodium chloride flush, sodium chloride flush, traMADol     Assessment/Plan   1. Severe Multivessel CAD + critical AS - 11/22/2019 S/P CABG x 4 + bioprosthetic AVR - doing well. No s/s ischemia - continue ASA  - statin on hold for elevated LFTs (downtrending) - b-blocker on hold due to shock  2. Post Cardiotomy Shock  - ECHO EF 50-55% intraoperative.  - Initial CO-OX post op 41% on Neo, Norepi, and Dopamine. Milrinone added, increased to 0.25 on 6/25.  - Remains on milrinone 0.25 + Norepi 3 mcg. CO-OX low will repeat now. Should be able to wean off norepi. - CVP down to 5-6. Stop lasix drip. Start torsemide 40 mg twice a day.  - -  Continue midodrine 5 mg tid for BP support  - Supp K. Consider spiro if K remains low off lasix drip.   3. Anemia , expected blood loss.  - Hgb stable 9.1   3. AKI due to post-op ATN - Renal function ok.   Plateued - Lower creatinine today 2.92 => 2.76 => 2.44=>2.07=>1.85=>1.7 =>1.7   4. DMII - On SSI3  5. Hypothyroidism - On synthroid 50 mcg.  - TSH normal   6. Muscle weakness. - PT following. CIR consulted   7. Shock liver - LFTs continue to fall.   8. ID - WBC have normalized. AF - Zosyn completed 6/26  9. Hypokalemia - Supp K   Amy Clegg NP-C  12/01/2019 7:10 AM   Patient seen and examined with the above-signed Advanced Practice Provider and/or Housestaff. I personally reviewed laboratory data, imaging studies and relevant notes. I independently examined the patient and formulated the important aspects of the plan. I have edited the note to reflect any of my changes or salient points. I have personally discussed the plan with the patient and/or family.  Remains on NE at 3 and milrinone 0.25.Volume status looks good. CVP 5. Creatinine improving. Co-ox remains low however.  Echo yesterday  EF 60% RV function  looks pretty good. AVR ok    General:  Elderly Sitting up in chair HO HEENT: normal Neck: supple. no JVD. Carotids 2+ bilat; no bruits. No lymphadenopathy or thryomegaly appreciated. Cor: PMI nondisplaced. Regular rate & rhythm. No rubs, gallops or murmurs. Lungs: clear Abdomen: soft, nontender, nondistended. No hepatosplenomegaly. No bruits or masses. Good bowel sounds. Extremities: no cyanosis, clubbing, rash, tr edema Neuro: alert & orientedx3, cranial nerves grossly intact. moves all 4 extremities w/o difficulty. Affect pleasant  Much improved though co-ox still low. However end-organ perfusion looks good. Stop NE. Continue milrinone for now. Start wean tomorrow. Can go to 4E from our perspective. Hold diuretics today.   Glori Bickers, MD  9:34 AM

## 2019-12-01 NOTE — Progress Notes (Signed)
Inpatient Rehab Admissions Coordinator:   Attempted to reach pt by phone x2 to discuss recommendations for CIR.  Unable to leave message.  Note still on milrinone drip; lasix and norepi drip stopped today. Will continue attempts to reach patient.   Shann Medal, PT, DPT Admissions Coordinator [phone] 12/01/19  2:45 PM

## 2019-12-01 NOTE — Progress Notes (Signed)
TCTS Evening Rounds  POD #9 s/p AVR/CABG Stable day Alert/oriented CTA RRR  BP (!) 104/49   Pulse 80   Temp 97.6 F (36.4 C) (Oral)   Resp (!) 21   Ht 5\' 11"  (1.803 m)   Wt 95.1 kg   SpO2 97%   BMI 29.24 kg/m     Intake/Output Summary (Last 24 hours) at 12/01/2019 1857 Last data filed at 12/01/2019 1700 Gross per 24 hour  Intake 495.69 ml  Output 2450 ml  Net -1954.31 ml    A/p: continue present management Michaline Kindig Z. Orvan Seen, [place]

## 2019-12-01 NOTE — Progress Notes (Signed)
Occupational Therapy Treatment Patient Details Name: William Walker MRN: [id number] DOB: 05-22-1927 Today's Date: 12/01/2019    History of present illness 86 yo admitted with chest pain with NSTEMI with severe AS and CAD s/p CABG/AVR 6/21. PMHx:HTN, DM, CAD   OT comments  Pt presents seated in recliner agreeable to working with OT. Assisted with return to bed during session. Pt continues to require modA for sit<>stand, once in standing able to perform mobility tasks using RW with minA. He continues to require cues for recall/carryover of precautions throughout. Pt on RA with SpO2 >90%, HR 82, BP start of session 90/73, end of session 124/57. Feel he remains appropriate for CIR level therapies at time of discharge. Will continue to follow while acutely admitted.   Follow Up Recommendations  CIR    Equipment Recommendations  Other (comment) (TBD)          Precautions / Restrictions Precautions Precautions: Fall;Sternal Precaution Comments: educated in sternal precautions during mobility       Mobility Bed Mobility Overal bed mobility: Needs Assistance Bed Mobility: Rolling;Sit to Sidelying Rolling: Min assist       Sit to sidelying: Mod assist General bed mobility comments: assist for LEs onto bed   Transfers Overall transfer level: Needs assistance Equipment used: Rolling walker (2 wheeled) Transfers: Sit to/from Stand Sit to Stand: Mod assist         General transfer comment: use of momentum with assist to boost to standing, x2 attempts with mod cues for UE placement     Balance Overall balance assessment: Needs assistance Sitting-balance support: Feet supported Sitting balance-Leahy Scale: Fair Sitting balance - Comments: EOB with guarding     Standing balance-Leahy Scale: Poor Standing balance comment: bil UE support on RW in standing                           ADL either performed or assessed with clinical judgement   ADL Overall ADL's : Needs  assistance/impaired Eating/Feeding: Set up;Sitting   Grooming: Set up;Sitting                               Functional mobility during ADLs: Minimal assistance;Rolling walker General ADL Comments: assisted wth return to bed during sesison     Vision       Perception     Praxis      Cognition Arousal/Alertness: Awake/alert Behavior During Therapy: WFL for tasks assessed/performed Overall Cognitive Status: Impaired/Different from baseline Area of Impairment: Problem solving;Memory                     Memory: Decreased recall of precautions;Decreased short-term memory       Problem Solving: Slow processing General Comments: requires cues for precautions         Exercises     Shoulder Instructions       General Comments      Pertinent Vitals/ Pain       Pain Assessment: Faces Faces Pain Scale: Hurts a little bit Pain Location: generalized discomfort from sitting  Pain Descriptors / Indicators: Discomfort Pain Intervention(s): Repositioned;Monitored during session  Home Living                                          Prior Functioning/Environment  Frequency  Min 2X/week        Progress Toward Goals  OT Goals(current goals can now be found in the care plan section)  Progress towards OT goals: Progressing toward goals  Acute Rehab OT Goals Patient Stated Goal: return home and fix things OT Goal Formulation: With patient Time For Goal Achievement: 12/09/19 Potential to Achieve Goals: Good ADL Goals Pt Will Perform Grooming: with min assist;standing Pt Will Perform Upper Body Bathing: with min assist;sitting Pt Will Perform Upper Body Dressing: with min assist;sitting Pt Will Transfer to Toilet: with min assist;ambulating;bedside commode Pt Will Perform Toileting - Clothing Manipulation and hygiene: with min assist;sit to/from stand Additional ADL Goal #1: Pt will perform bed mobility with  supervision in preparation for ADL. Additional ADL Goal #2: Pt will adhere to sternal precautions with minimal verbal cues.  Plan Discharge plan remains appropriate    Co-evaluation                 AM-PAC OT "6 Clicks" Daily Activity     Outcome Measure   Help from another person eating meals?: A Little Help from another person taking care of personal grooming?: A Little Help from another person toileting, which includes using toliet, bedpan, or urinal?: A Lot Help from another person bathing (including washing, rinsing, drying)?: A Lot Help from another person to put on and taking off regular upper body clothing?: A Lot Help from another person to put on and taking off regular lower body clothing?: Total 6 Click Score: 13    End of Session Equipment Utilized During Treatment: Rolling walker  OT Visit Diagnosis: Unsteadiness on feet (R26.81);Pain;Muscle weakness (generalized) (M62.81);Other symptoms and signs involving cognitive function   Activity Tolerance Patient tolerated treatment well   Patient Left in bed;with call bell/phone within reach;with bed alarm set   Nurse Communication Mobility status        Time: 3817-7116 OT Time Calculation (min): 25 min  Charges: OT General Charges $OT Visit: 1 Visit OT Treatments $Self Care/Home Management : 23-37 mins  William Walker, [place] Pager [phone] Office [place] 12/01/2019, 6:00 PM

## 2019-12-01 NOTE — Progress Notes (Addendum)
TCTS DAILY ICU PROGRESS NOTE                   [facility]            [place] [address]          [phone]   9 Days Post-Op Procedure(s) (LRB): CORONARY ARTERY BYPASS GRAFTING (CABG), ON PUMP, TIMES FOUR , USING LEFT INTERNAL MAMMARY ARTERY AND ENDOSCOPICALLY HARVESTED RIGHT GREATER SAPHENOUS VEIN (N/A) AORTIC VALVE REPLACEMENT (AVR) USING INSPIRIS 23MM (N/A) TRANSESOPHAGEAL ECHOCARDIOGRAM (TEE) (N/A)  Total Length of Stay:  LOS: 16 days   Subjective: Sitting up eating breakfast. He is alert and oriented and continues to walk in the hall in the unit with assistance.  Remains on milrinone and norepi.  Objective: Vital signs in last 24 hours: Temp:  [98 F (36.7 C)-98.7 F (37.1 C)] 98.3 F (36.8 C) (06/30 0400) Pulse Rate:  [80-95] 89 (06/30 0700) Cardiac Rhythm: Normal sinus rhythm (06/30 0400) Resp:  [11-26] 22 (06/30 0700) BP: (93-138)/(48-71) 113/56 (06/30 0700) SpO2:  [95 %-100 %] 100 % (06/30 0700) Weight:  [95.1 kg] 95.1 kg (06/30 0545)  Filed Weights   11/29/19 0600 11/30/19 0500 12/01/19 0545  Weight: 97.6 kg 96.6 kg 95.1 kg    Weight change: -1.5 kg   Hemodynamic parameters for last 24 hours: CVP:  [5 mmHg-10 mmHg] 5 mmHg  Intake/Output from previous day: 06/29 0701 - 06/30 0700 In: 741.2 [P.O.:120; I.V.:621.2] Out: 3325 [LGXQJ:1941]  Intake/Output this shift: No intake/output data recorded.  Current Meds: Scheduled Meds: . aspirin EC  325 mg Oral Daily   Or  . aspirin  324 mg Per Tube Daily  . bisacodyl  10 mg Oral Daily   Or  . bisacodyl  10 mg Rectal Daily  . Chlorhexidine Gluconate Cloth  6 each Topical Daily  . docusate sodium  200 mg Oral Daily  . enoxaparin (LOVENOX) injection  30 mg Subcutaneous Q24H  . feeding supplement  237 mL Oral BID BM  . insulin aspart  0-24 Units Subcutaneous TID AC & HS  . insulin detemir  16 Units Subcutaneous Daily  . levothyroxine  50 mcg Oral QAC breakfast  . mouth rinse  15 mL Mouth  Rinse BID  . midodrine  5 mg Oral TID WC  . multivitamin with minerals  1 tablet Oral Daily  . pantoprazole  40 mg Oral Daily  . sodium chloride flush  10-40 mL Intracatheter Q12H  . sodium chloride flush  3 mL Intravenous Q12H  . torsemide  40 mg Oral BID   Continuous Infusions: . sodium chloride    . sodium chloride Stopped (11/24/19 1458)  . lactated ringers    . milrinone 0.25 mcg/kg/min (12/01/19 0400)  . norepinephrine (LEVOPHED) Adult infusion 3 mcg/min (12/01/19 0400)   PRN Meds:.dextrose, metoprolol tartrate, morphine injection, ondansetron (ZOFRAN) IV, oxyCODONE, sodium chloride flush, sodium chloride flush, traMADol  General appearance: alert, cooperative and no distress Neurologic: intact Heart: regular rate and rhythm Lungs: clear to auscultation bilaterally Abdomen: Soft, non-tender. Active bowel sounds. Extremities: Lower legs wrapped, still has significant swelling in both feet.  Wound: The incision is intact and dry.   Lab Results: CBC: Recent Labs    11/30/19 0515 12/01/19 0433  WBC 6.2 7.5  HGB 8.4* 9.1*  HCT 25.3* 27.5*  PLT 184 231   BMET:  Recent Labs    11/30/19 1533 12/01/19 0433  NA 131* 133*  K 3.3* 3.6  CL 93* 94*  CO2 27 29  GLUCOSE 217* 135*  BUN 39* 33*  CREATININE 1.70* 1.74*  CALCIUM 8.1* 8.3*    CMET: Lab Results  Component Value Date   WBC 7.5 12/01/2019   HGB 9.1 (L) 12/01/2019   HCT 27.5 (L) 12/01/2019   PLT 231 12/01/2019   GLUCOSE 135 (H) 12/01/2019   CHOL 166 11/16/2019   TRIG 88 11/16/2019   HDL 47 11/16/2019   LDLCALC 101 (H) 11/16/2019   ALT 181 (H) 12/01/2019   AST 57 (H) 12/01/2019   NA 133 (L) 12/01/2019   K 3.6 12/01/2019   CL 94 (L) 12/01/2019   CREATININE 1.74 (H) 12/01/2019   BUN 33 (H) 12/01/2019   CO2 29 12/01/2019   TSH 1.349 11/24/2019   INR 1.7 (H) 11/22/2019   HGBA1C 7.4 (H) 11/15/2019      PT/INR: No results for input(s): LABPROT, INR in the last 72 hours. Radiology: ECHOCARDIOGRAM  LIMITED  Result Date: 11/30/2019    ECHOCARDIOGRAM LIMITED REPORT   Patient Name:   William Walker Date of Exam: 11/30/2019 Medical Rec #:  [id number]     Height:       71.0 in Accession #:    [id number]    Weight:       213.0 lb Date of Birth:  04/29/1927      BSA:          2.166 m Patient Age:    86 years      BP:           130/71 mmHg Patient Gender: M             HR:           86 bpm. Exam Location:  Inpatient Procedure: Limited Echo, Limited Color Doppler and Cardiac Doppler Indications:    acute systolic chf 244.01  History:        Patient has prior history of Echocardiogram examinations, most                 recent 11/16/2019. CAD.                 Aortic Valve: bioprosthetic valve is present in the aortic                 position.  Sonographer:    Johny Chess Referring Phys: [place]  1. Left ventricular ejection fraction, by estimation, is 60 to 65%. The left ventricle has normal function. The left ventricle has no regional wall motion abnormalities. Left ventricular diastolic parameters are consistent with Grade II diastolic dysfunction (pseudonormalization). Elevated left atrial pressure.  2. Right ventricular systolic function was not well visualized. The right ventricular size is normal. There is mildly elevated pulmonary artery systolic pressure. The estimated right ventricular systolic pressure is 02.7 mmHg.  3. The mitral valve is normal in structure. Trivial mitral valve regurgitation.  4. There is no perivalvular leak. The aortic valve has been repaired/replaced. Aortic valve regurgitation is mild. There is a bioprosthetic valve present in the aortic position. Echo findings are consistent with regurgitation of the aortic prosthesis. Comparison(s): Prior images reviewed side by side. The left ventricular function has improved. The left ventricular wall motion has improved. The right ventricle is poorly seen on this study and its systolic function cannot be assessed.  FINDINGS  Left Ventricle: Left ventricular ejection fraction, by estimation, is 60 to 65%. The left ventricle has normal function. The left ventricle has no regional wall  motion abnormalities. The left ventricular internal cavity size was normal in size. There is  no left ventricular hypertrophy. Abnormal (paradoxical) septal motion consistent with post-operative status. Elevated left atrial pressure. Right Ventricle: The right ventricular size is normal. No increase in right ventricular wall thickness. Right ventricular systolic function was not well visualized. There is mildly elevated pulmonary artery systolic pressure. The tricuspid regurgitant velocity is 2.89 m/s, and with an assumed right atrial pressure of 10 mmHg, the estimated right ventricular systolic pressure is 63.1 mmHg. Left Atrium: Left atrial size was normal in size. Right Atrium: Right atrial size was normal in size. Pericardium: There is no evidence of pericardial effusion. Mitral Valve: The mitral valve is normal in structure. Trivial mitral valve regurgitation. Tricuspid Valve: The tricuspid valve is normal in structure. Tricuspid valve regurgitation is mild. Aortic Valve: There is no perivalvular leak. The aortic valve has been repaired/replaced. Aortic valve regurgitation is mild. Aortic valve mean gradient measures 7.0 mmHg. Aortic valve peak gradient measures 15.7 mmHg. Aortic valve area, by VTI measures 1.06 cm. There is a bioprosthetic valve present in the aortic position. Pulmonic Valve: The pulmonic valve was not well visualized. Pulmonic valve regurgitation is not visualized. Aorta: The aortic root is normal in size and structure. Venous: The inferior vena cava was not well visualized. IAS/Shunts: No atrial level shunt detected by color flow Doppler.  LEFT VENTRICLE PLAX 2D LVIDd:         4.60 cm     Diastology LVIDs:         3.00 cm     LV e' lateral:   6.74 cm/s LV PW:         1.10 cm     LV E/e' lateral: 13.9 LV IVS:        1.00 cm      LV e' medial:    4.35 cm/s LVOT diam:     1.50 cm     LV E/e' medial:  21.5 LV SV:         28 LV SV Index:   13 LVOT Area:     1.77 cm  LV Volumes (MOD) LV vol d, MOD A2C: 69.5 ml LV vol d, MOD A4C: 62.6 ml LV vol s, MOD A2C: 33.0 ml LV vol s, MOD A4C: 33.0 ml LV SV MOD A2C:     36.5 ml LV SV MOD A4C:     62.6 ml LV SV MOD BP:      34.1 ml LEFT ATRIUM         Index LA diam:    3.20 cm 1.48 cm/m  AORTIC VALVE AV Area (Vmax):    0.84 cm AV Area (Vmean):   0.83 cm AV Area (VTI):     1.06 cm AV Vmax:           198.00 cm/s AV Vmean:          120.000 cm/s AV VTI:            0.264 m AV Peak Grad:      15.7 mmHg AV Mean Grad:      7.0 mmHg LVOT Vmax:         94.20 cm/s LVOT Vmean:        56.700 cm/s LVOT VTI:          0.158 m LVOT/AV VTI ratio: 0.60 MITRAL VALVE               TRICUSPID VALVE MV Area (PHT): 3.53 cm  TR Peak grad:   33.4 mmHg MV Decel Time: 215 msec    TR Vmax:        289.00 cm/s MV E velocity: 93.60 cm/s MV A velocity: 83.20 cm/s  SHUNTS MV E/A ratio:  1.12        Systemic VTI:  0.16 m                            Systemic Diam: 1.50 cm Mihai Croitoru MD Electronically signed by Sanda Klein MD Signature Date/Time: 11/30/2019/2:00:40 PM    Final      Assessment/Plan: S/P Procedure(s) (LRB): CORONARY ARTERY BYPASS GRAFTING (CABG), ON PUMP, TIMES FOUR , USING LEFT INTERNAL MAMMARY ARTERY AND ENDOSCOPICALLY HARVESTED RIGHT GREATER SAPHENOUS VEIN (N/A) AORTIC VALVE REPLACEMENT (AVR) USING INSPIRIS 23MM (N/A) TRANSESOPHAGEAL ECHOCARDIOGRAM (TEE) (N/A)  -POD-9CABG x 4 and AVR for severe AS with post bypass cardiogenic shock. Hemodynamics improving,  CoOx 40 (lab to be repeated) and CVP 5 today.Remains on Milrinone at 0.70mcg/kg/min. Mgt per HF team.  Continue ICU care. D/C pacer wires today.   --Acute on chronic renal insufficiency with volume excess--Creat down to baseline.  Lasix drip off, on BID torsemide.   -Expected acute blood loss anemia- hcttrending up,  Monitor.   -Type  2 DM-  Pre-op A1C 7.4.  Glucosecontrol adequate, continueLevemir to 16 units [place] daily andSSI.   -Respiratory insufficiency- resolved, on RA, Sat 100%     Antony Odea, PA-C [phone] 12/01/2019 8:12 AM  Overall continues to improve inspite of low coox , cvp now done to 5  Ambulating better Alert and taking po diet better Renal function at baseline I have seen and examined Nilda Simmer and agree with the above assessment  and plan.  [name] MD Beeper [phone] Office [phone] 12/01/2019 9:03 AM

## 2019-12-02 LAB — COMPREHENSIVE METABOLIC PANEL
ALT: 146 U/L — ABNORMAL HIGH (ref 0–44)
AST: 70 U/L — ABNORMAL HIGH (ref 15–41)
Albumin: 2.7 g/dL — ABNORMAL LOW (ref 3.5–5.0)
Alkaline Phosphatase: 61 U/L (ref 38–126)
Anion gap: 11 (ref 5–15)
BUN: 35 mg/dL — ABNORMAL HIGH (ref 8–23)
CO2: 27 mmol/L (ref 22–32)
Calcium: 8.2 mg/dL — ABNORMAL LOW (ref 8.9–10.3)
Chloride: 95 mmol/L — ABNORMAL LOW (ref 98–111)
Creatinine, Ser: 1.77 mg/dL — ABNORMAL HIGH (ref 0.61–1.24)
GFR calc Af Amer: 38 mL/min — ABNORMAL LOW (ref >60)
GFR calc non Af Amer: 33 mL/min — ABNORMAL LOW (ref >60)
Glucose, Bld: 126 mg/dL — ABNORMAL HIGH (ref 70–99)
Potassium: 3.4 mmol/L — ABNORMAL LOW (ref 3.5–5.1)
Sodium: 133 mmol/L — ABNORMAL LOW (ref 135–145)
Total Bilirubin: 1.2 mg/dL (ref 0.3–1.2)
Total Protein: 4.9 g/dL — ABNORMAL LOW (ref 6.5–8.1)

## 2019-12-02 LAB — COOXEMETRY PANEL
Carboxyhemoglobin: 1.3 % (ref 0.5–1.5)
Methemoglobin: 0.6 % (ref 0.0–1.5)
O2 Saturation: 61.1 %
Total hemoglobin: 8.9 g/dL — ABNORMAL LOW (ref 12.0–16.0)

## 2019-12-02 LAB — CBC
HCT: 26.3 % — ABNORMAL LOW (ref 39.0–52.0)
Hemoglobin: 8.8 g/dL — ABNORMAL LOW (ref 13.0–17.0)
MCH: 32.8 pg (ref 26.0–34.0)
MCHC: 33.5 g/dL (ref 30.0–36.0)
MCV: 98.1 fL (ref 80.0–100.0)
Platelets: 246 10*3/uL (ref 150–400)
RBC: 2.68 MIL/uL — ABNORMAL LOW (ref 4.22–5.81)
RDW: 17.7 % — ABNORMAL HIGH (ref 11.5–15.5)
WBC: 7.2 10*3/uL (ref 4.0–10.5)
nRBC: 0 % (ref 0.0–0.2)

## 2019-12-02 LAB — BASIC METABOLIC PANEL
Anion gap: 9 (ref 5–15)
BUN: 34 mg/dL — ABNORMAL HIGH (ref 8–23)
CO2: 27 mmol/L (ref 22–32)
Calcium: 8 mg/dL — ABNORMAL LOW (ref 8.9–10.3)
Chloride: 93 mmol/L — ABNORMAL LOW (ref 98–111)
Creatinine, Ser: 1.67 mg/dL — ABNORMAL HIGH (ref 0.61–1.24)
GFR calc Af Amer: 41 mL/min — ABNORMAL LOW (ref >60)
GFR calc non Af Amer: 35 mL/min — ABNORMAL LOW (ref >60)
Glucose, Bld: 241 mg/dL — ABNORMAL HIGH (ref 70–99)
Potassium: 4 mmol/L (ref 3.5–5.1)
Sodium: 129 mmol/L — ABNORMAL LOW (ref 135–145)

## 2019-12-02 LAB — GLUCOSE, CAPILLARY
Glucose-Capillary: 135 mg/dL — ABNORMAL HIGH (ref 70–99)
Glucose-Capillary: 168 mg/dL — ABNORMAL HIGH (ref 70–99)
Glucose-Capillary: 243 mg/dL — ABNORMAL HIGH (ref 70–99)
Glucose-Capillary: 178 mg/dL — ABNORMAL HIGH (ref 70–99)

## 2019-12-02 MED ORDER — TORSEMIDE 20 MG PO TABS
40.0000 mg | ORAL_TABLET | Freq: Every day | ORAL | Status: DC
  Administered 2019-12-02 – 2019-12-07 (×6): 40 mg via ORAL
  Filled 2019-12-02 (×6): qty 2

## 2019-12-02 MED ORDER — POTASSIUM CHLORIDE CRYS ER 20 MEQ PO TBCR: 40.0000 meq | EXTENDED_RELEASE_TABLET | Freq: Once | ORAL | Status: DC

## 2019-12-02 MED ORDER — POTASSIUM CHLORIDE CRYS ER 20 MEQ PO TBCR
20.0000 meq | EXTENDED_RELEASE_TABLET | Freq: Once | ORAL | Status: AC
  Administered 2019-12-02: 20 meq via ORAL
  Filled 2019-12-02: qty 1

## 2019-12-02 MED ORDER — BOOST / RESOURCE BREEZE PO LIQD CUSTOM
1.0000 | Freq: Three times a day (TID) | ORAL | Status: DC
  Administered 2019-12-02 – 2019-12-07 (×11): 1 via ORAL

## 2019-12-02 MED ORDER — POTASSIUM CHLORIDE CRYS ER 20 MEQ PO TBCR
20.0000 meq | EXTENDED_RELEASE_TABLET | ORAL | Status: AC
  Administered 2019-12-02 (×3): 20 meq via ORAL
  Filled 2019-12-02 (×2): qty 1

## 2019-12-02 NOTE — Progress Notes (Signed)
Patient ID: William Walker, male   DOB: 1926-07-05, 86 y.o.   MRN: [id number]     Advanced Heart Failure Rounding Note  PCP-Cardiologist: [name], MD  AHF: Dr. Haroldine Laws    Patient Profile   86 y/o male with HTN, DM, CAD admitted with NSTEMI found to have severe 3v CAD and severe AS with normal EF.   Underwent CABG/AVR 11/22/19.  AHF consulted for post-cardiotomy shock.   Subjective:    Off lasix gtt. On milrinone 0.25. Co-ox 43% -> 61% Feels good . Walked the halls. Rhythm stable CVP 10  Objective:   Weight Range: 95.5 kg Body mass index is 29.36 kg/m.   Vital Signs:   Temp:  [97 F (36.1 C)-98.2 F (36.8 C)] 98.1 F (36.7 C) (07/01 0723) Pulse Rate:  [74-93] 86 (07/01 0800) Resp:  [12-24] 12 (07/01 0800) BP: (89-115)/(43-73) 101/51 (07/01 0800) SpO2:  [91 %-100 %] 95 % (07/01 0800) Weight:  [95.5 kg] 95.5 kg (07/01 0630) Last BM Date: 11/28/19  CVP:  [4 mmHg-11 mmHg] 5 mmHg   Weight change: Filed Weights   11/30/19 0500 12/01/19 0545 12/02/19 0630  Weight: 96.6 kg 95.1 kg 95.5 kg    Intake/Output:   Intake/Output Summary (Last 24 hours) at 12/02/2019 0841 Last data filed at 12/02/2019 0800 Gross per 24 hour  Intake 547.02 ml  Output 1925 ml  Net -1377.98 ml      Physical Exam   General:  Elderly male sitting in chair No resp difficulty HEENT: normal Neck: supple. JVP to jaw Carotids 2+ bilat; no bruits. No lymphadenopathy or thryomegaly appreciated. Cor: Chest wound ok  PMI nondisplaced. Regular rate & rhythm. No rubs, gallops or murmurs. Lungs: clear Abdomen: soft, nontender, nondistended. No hepatosplenomegaly. No bruits or masses. Good bowel sounds. Extremities: no cyanosis, clubbing, rash, 1-2+ edema in feet UNNA boots Neuro: alert & orientedx3, cranial nerves grossly intact. moves all 4 extremities w/o difficulty. Affect pleasant   Telemetry   NSR 80-90 Personally reviewed  Labs    CBC Recent Labs    12/01/19 0433 12/02/19 0324    WBC 7.5 7.2  HGB 9.1* 8.8*  HCT 27.5* 26.3*  MCV 97.9 98.1  PLT 231 427   Basic Metabolic Panel Recent Labs    11/30/19 0515 11/30/19 1533 12/01/19 0433 12/02/19 0324  NA 134*   < > 133* 133*  K 3.3*   < > 3.6 3.4*  CL 96*   < > 94* 95*  CO2 27   < > 29 27  GLUCOSE 138*   < > 135* 126*  BUN 39*   < > 33* 35*  CREATININE 1.85*   < > 1.74* 1.77*  CALCIUM 8.2*   < > 8.3* 8.2*  MG 2.4  --   --   --    < > = values in this interval not displayed.   Liver Function Tests Recent Labs    12/01/19 0433 12/02/19 0324  AST 57* 70*  ALT 181* 146*  ALKPHOS 74 61  BILITOT 1.0 1.2  PROT 5.1* 4.9*  ALBUMIN 2.8* 2.7*   No results for input(s): LIPASE, AMYLASE in the last 72 hours. Cardiac Enzymes No results for input(s): CKTOTAL, CKMB, CKMBINDEX, TROPONINI in the last 72 hours.  BNP: BNP (last 3 results) Recent Labs    11/15/19 1947  BNP 606.3*    ProBNP (last 3 results) No results for input(s): PROBNP in the last 8760 hours.   D-Dimer No results for input(s): DDIMER  in the last 72 hours. Hemoglobin A1C No results for input(s): HGBA1C in the last 72 hours. Fasting Lipid Panel No results for input(s): CHOL, HDL, LDLCALC, TRIG, CHOLHDL, LDLDIRECT in the last 72 hours. Thyroid Function Tests No results for input(s): TSH, T4TOTAL, T3FREE, THYROIDAB in the last 72 hours.  Invalid input(s): FREET3  Other results:   Imaging    No results found.   Medications:     Scheduled Medications: . aspirin EC  325 mg Oral Daily   Or  . aspirin  324 mg Per Tube Daily  . bisacodyl  10 mg Oral Daily   Or  . bisacodyl  10 mg Rectal Daily  . Chlorhexidine Gluconate Cloth  6 each Topical Daily  . docusate sodium  200 mg Oral Daily  . enoxaparin (LOVENOX) injection  30 mg Subcutaneous Q24H  . insulin aspart  0-24 Units Subcutaneous TID AC & HS  . insulin detemir  16 Units Subcutaneous Daily  . lactose free nutrition  237 mL Oral TID WC  . levothyroxine  50 mcg Oral QAC  breakfast  . mouth rinse  15 mL Mouth Rinse BID  . midodrine  5 mg Oral TID WC  . multivitamin with minerals  1 tablet Oral Daily  . pantoprazole  40 mg Oral Daily  . potassium chloride  20 mEq Oral Q4H  . sodium chloride flush  10-40 mL Intracatheter Q12H  . sodium chloride flush  3 mL Intravenous Q12H  . torsemide  40 mg Oral Daily    Infusions: . sodium chloride    . sodium chloride Stopped (11/24/19 1458)  . lactated ringers    . milrinone 0.25 mcg/kg/min (12/02/19 0800)    PRN Medications: dextrose, metoprolol tartrate, morphine injection, ondansetron (ZOFRAN) IV, oxyCODONE, sodium chloride flush, sodium chloride flush, traMADol     Assessment/Plan   1. Severe Multivessel CAD + critical AS - 11/22/2019 S/P CABG x 4 + bioprosthetic AVR - doing well. No s/s ischemia - continue ASA  - statin on hold for elevated LFTs (downtrending) - b-blocker on hold due to shock/low co-ox  2. Post Cardiotomy Shock  - ECHO EF 50-55% intraoperative.  - Remains on milrinone 0.25. Co-ox 42%-> 61% (improved with CVP 5->10) - Wean milrinone slowly to 0.2. Follow co-ox - Start torsemide 40 daily (do not diurese too aggressively with RV dysfunction) - On midodrine 5 mg tid for BP support  - Supp K  3. Anemia , expected blood loss.  - Hgb stable 8.8   3. AKI due to post-op ATN - Renal function ok.   Plateued - Creatinine stabilized at 1.7-1.8  4. DMII - On SSI  5. Hypokalemia - Supp K   D/w Dr. Servando Snare at bedside.   Glori Bickers MD 12/02/2019 8:41 AM

## 2019-12-02 NOTE — Progress Notes (Signed)
EVENING ROUNDS NOTE :     [address]       [place] [address]             [phone]                 10 Days Post-Op Procedure(s) (LRB): CORONARY ARTERY BYPASS GRAFTING (CABG), ON PUMP, TIMES FOUR , USING LEFT INTERNAL MAMMARY ARTERY AND ENDOSCOPICALLY HARVESTED RIGHT GREATER SAPHENOUS VEIN (N/A) AORTIC VALVE REPLACEMENT (AVR) USING INSPIRIS 23MM (N/A) TRANSESOPHAGEAL ECHOCARDIOGRAM (TEE) (N/A)   Total Length of Stay:  LOS: 17 days  Events:  No events Pt up to chair Awaiting CIR    BP (!) 117/54    Pulse 83    Temp 98 F (36.7 C) (Oral)    Resp 15    Ht 5\' 11"  (1.803 m)    Wt 95.5 kg    SpO2 97%    BMI 29.36 kg/m   CVP:  [5 mmHg-11 mmHg] 10 mmHg      sodium chloride     sodium chloride Stopped (11/24/19 1458)   lactated ringers     milrinone 0.2 mcg/kg/min (12/02/19 1525)    I/O last 3 completed shifts: In: 961.7 [P.O.:360; I.V.:601.7] Out: 0263 [Urine:3375]   CBC Latest Ref Rng & Units 12/02/2019 12/01/2019 11/30/2019  WBC 4.0 - 10.5 K/uL 7.2 7.5 6.2  Hemoglobin 13.0 - 17.0 g/dL 8.8(L) 9.1(L) 8.4(L)  Hematocrit 39 - 52 % 26.3(L) 27.5(L) 25.3(L)  Platelets 150 - 400 K/uL 246 231 184    BMP Latest Ref Rng & Units 12/02/2019 12/02/2019 12/01/2019  Glucose 70 - 99 mg/dL 241(H) 126(H) 135(H)  BUN 8 - 23 mg/dL 34(H) 35(H) 33(H)  Creatinine 0.61 - 1.24 mg/dL 1.67(H) 1.77(H) 1.74(H)  BUN/Creat Ratio - - - -  Sodium 135 - 145 mmol/L 129(L) 133(L) 133(L)  Potassium 3.5 - 5.1 mmol/L 4.0 3.4(L) 3.6  Chloride 98 - 111 mmol/L 93(L) 95(L) 94(L)  CO2 22 - 32 mmol/L 27 27 29   Calcium 8.9 - 10.3 mg/dL 8.0(L) 8.2(L) 8.3(L)    ABG    Component Value Date/Time   PHART 7.323 (L) 11/23/2019 0211   PCO2ART 35.5 11/23/2019 0211   PO2ART 78 (L) 11/23/2019 0211   HCO3 18.4 (L) 11/23/2019 0211   TCO2 19 (L) 11/23/2019 0211   ACIDBASEDEF 7.0 (H) 11/23/2019 0211   O2SAT 61.1 12/02/2019 0324       Melodie Bouillon, MD 12/02/2019 4:31 PM

## 2019-12-02 NOTE — Progress Notes (Signed)
Nutrition Follow-up  DOCUMENTATION CODES:   Not applicable  INTERVENTION:   Boost Breeze po TID, each supplement provides 250 kcal and 9 grams of protein  D/C Boost Plus  Continue MVI with Minerals   NUTRITION DIAGNOSIS:   Increased nutrient needs related to post-op healing as evidenced by estimated needs.  Being addressed via supplements  GOAL:   Patient will meet greater than or equal to 90% of their needs  Progressing  MONITOR:   PO intake, Supplement acceptance, Weight trends, Labs, I & O's, Skin  REASON FOR ASSESSMENT:   Rounds    ASSESSMENT:   Patient with PMH significant for severe calcific aortic stenosis, multivessel CAD s/p stenting, CVD, HTN, HLD, and DM. Presents this admission with moderate AI and severe aortic stenosis.   6/21 CABG/AVR  Appetite has not been great but Pt reports appetite improving today; pt reports he ate eggs, sausage and oatmeal this AM for breakfast. Recorded po intake 25-50%  Pt does NOT like the milky supplements; states they taste like "crap." Pt does however like the Boost Breeze; pt states he drank an entire orange one the other night and liked. Plan to change supplement to Boost Breeze TID  Pt with poor dentition; currently on a Dysphagia 3 (easy to chew) diet with no issues chewing  Admission weight 96 kg; current weight 95.5 kg. Noted weight has fluctuated significantly since admission from 91 kg to 102 kg  Labs: sodium 133, potassium 3.4 Meds: ss novolog, levemir, MVI with Minerals, KCl, toresemide   Diet Order:   Diet Order            DIET DYS 3 Room service appropriate? Yes; Fluid consistency: Thin  Diet effective now                 EDUCATION NEEDS:   Not appropriate for education at this time  Skin:  Skin Assessment: Skin Integrity Issues: Skin Integrity Issues:: Incisions Incisions: R leg, chest  Last BM:  6/28  Height:   Ht Readings from Last 1 Encounters:  11/15/19 5\' 11"  (1.803 m)     Weight:   Wt Readings from Last 1 Encounters:  12/02/19 95.5 kg     BMI:  Body mass index is 29.36 kg/m.  Estimated Nutritional Needs:   Kcal:  1800-2000 kcals  Protein:  90-100 g  Fluid:  >/= 1.8 L  Kerman Passey MS, RDN, LDN, CNSC Registered Dietitian III RD Pager Number and RD On-Call Pager Number Located in [facility]

## 2019-12-02 NOTE — Progress Notes (Signed)
Inpatient Rehab Admissions:  Inpatient Rehab Consult received.  I met with patient and his daughter at the bedside for rehabilitation assessment and to discuss goals and expectations of an inpatient rehab admission.  At this time, they are interested in CIR, if needed when medically ready, prior to return home.  I will need prior authorization from [facility] Medicare prior to being able to offer a bed.  Will open insurance today and continue to follow.    Signed: Shann Medal, PT, DPT Admissions Coordinator [phone] 12/02/19  12:19 PM

## 2019-12-02 NOTE — Progress Notes (Addendum)
TCTS DAILY ICU PROGRESS NOTE                   [address]            [place] [address]          [phone]   10 Days Post-Op Procedure(s) (LRB): CORONARY ARTERY BYPASS GRAFTING (CABG), ON PUMP, TIMES FOUR , USING LEFT INTERNAL MAMMARY ARTERY AND ENDOSCOPICALLY HARVESTED RIGHT GREATER SAPHENOUS VEIN (N/A) AORTIC VALVE REPLACEMENT (AVR) USING INSPIRIS 23MM (N/A) TRANSESOPHAGEAL ECHOCARDIOGRAM (TEE) (N/A)  Total Length of Stay:  LOS: 17 days   Subjective: Awake and alert, finishing breakfast. No new concerns. Stable night per his RN.  He has walked in the hall this AM.  Milrinone at 0.71mcg/kg/min.  CoOx 60.  Objective: Vital signs in last 24 hours: Temp:  [97 F (36.1 C)-98.2 F (36.8 C)] 98.1 F (36.7 C) (07/01 0723) Pulse Rate:  [74-93] 87 (07/01 0700) Cardiac Rhythm: Normal sinus rhythm (07/01 0400) Resp:  [12-24] 15 (07/01 0700) BP: (89-115)/(43-73) 112/59 (07/01 0700) SpO2:  [91 %-100 %] 92 % (07/01 0700) Weight:  [95.5 kg] 95.5 kg (07/01 0630)  Filed Weights   11/30/19 0500 12/01/19 0545 12/02/19 0630  Weight: 96.6 kg 95.1 kg 95.5 kg    Weight change: 0.4 kg   Hemodynamic parameters for last 24 hours: CVP:  [4 mmHg-11 mmHg] 5 mmHg  Intake/Output from previous day: 06/30 0701 - 07/01 0700 In: 658.4 [P.O.:360; I.V.:298.4] Out: 1625 [Urine:1625]  Intake/Output this shift: No intake/output data recorded.  Current Meds: Scheduled Meds: . aspirin EC  325 mg Oral Daily   Or  . aspirin  324 mg Per Tube Daily  . bisacodyl  10 mg Oral Daily   Or  . bisacodyl  10 mg Rectal Daily  . Chlorhexidine Gluconate Cloth  6 each Topical Daily  . docusate sodium  200 mg Oral Daily  . enoxaparin (LOVENOX) injection  30 mg Subcutaneous Q24H  . insulin aspart  0-24 Units Subcutaneous TID AC & HS  . insulin detemir  16 Units Subcutaneous Daily  . lactose free nutrition  237 mL Oral TID WC  . levothyroxine  50 mcg Oral QAC breakfast  . mouth rinse  15  mL Mouth Rinse BID  . midodrine  5 mg Oral TID WC  . multivitamin with minerals  1 tablet Oral Daily  . pantoprazole  40 mg Oral Daily  . potassium chloride  20 mEq Oral Q4H  . sodium chloride flush  10-40 mL Intracatheter Q12H  . sodium chloride flush  3 mL Intravenous Q12H   Continuous Infusions: . sodium chloride    . sodium chloride Stopped (11/24/19 1458)  . lactated ringers    . milrinone 0.25 mcg/kg/min (12/02/19 0700)   PRN Meds:.dextrose, metoprolol tartrate, morphine injection, ondansetron (ZOFRAN) IV, oxyCODONE, sodium chloride flush, sodium chloride flush, traMADol  General appearance: alert, cooperative and no distress Neurologic: intact Heart: regular rate and rhythm Lungs: clear to auscultation bilaterally Abdomen: Soft, non-tender. Active bowel sounds. Extremities: Lower legs wrapped, still has significant swelling in both feet.  Wound: The sternal incision is intact and dry, pacer wires out.   Lab Results: CBC: Recent Labs    12/01/19 0433 12/02/19 0324  WBC 7.5 7.2  HGB 9.1* 8.8*  HCT 27.5* 26.3*  PLT 231 246   BMET:  Recent Labs    12/01/19 0433 12/02/19 0324  NA 133* 133*  K 3.6 3.4*  CL 94* 95*  CO2  29 27  GLUCOSE 135* 126*  BUN 33* 35*  CREATININE 1.74* 1.77*  CALCIUM 8.3* 8.2*    CMET: Lab Results  Component Value Date   WBC 7.2 12/02/2019   HGB 8.8 (L) 12/02/2019   HCT 26.3 (L) 12/02/2019   PLT 246 12/02/2019   GLUCOSE 126 (H) 12/02/2019   CHOL 166 11/16/2019   TRIG 88 11/16/2019   HDL 47 11/16/2019   LDLCALC 101 (H) 11/16/2019   ALT 146 (H) 12/02/2019   AST 70 (H) 12/02/2019   NA 133 (L) 12/02/2019   K 3.4 (L) 12/02/2019   CL 95 (L) 12/02/2019   CREATININE 1.77 (H) 12/02/2019   BUN 35 (H) 12/02/2019   CO2 27 12/02/2019   TSH 1.349 11/24/2019   INR 1.7 (H) 11/22/2019   HGBA1C 7.4 (H) 11/15/2019      PT/INR: No results for input(s): LABPROT, INR in the last 72 hours. Radiology: No results  found.   Assessment/Plan: S/P Procedure(s) (LRB): CORONARY ARTERY BYPASS GRAFTING (CABG), ON PUMP, TIMES FOUR , USING LEFT INTERNAL MAMMARY ARTERY AND ENDOSCOPICALLY HARVESTED RIGHT GREATER SAPHENOUS VEIN (N/A) AORTIC VALVE REPLACEMENT (AVR) USING INSPIRIS 23MM (N/A) TRANSESOPHAGEAL ECHOCARDIOGRAM (TEE) (N/A)  -POD-10CABG x 4 and AVR for severe AS with post bypass cardiogenic shock. Hemodynamics improving,CoOx 60 Remains on Milrinone at 0.85mcg/kg/min. Norepinephrine off. OK to transfer  per HF team, will discuss with Dr. Servando Snare.   --Acute on chronic renal insufficiency with volume excess--Diuresed over past week, Wt now ~2kg below pre-op baseline. CVP 5. Creat stable near baseline. Lasix drip off, on BID torsemide.   -Expected acute blood loss anemia- hctstable,Monitor.   -Type 2 DM- Pre-op A1C 7.4. Glucosecontroladequate, having some spikes in glucose related to nutrition supplements.  ContinueLevemir to 16 units [place] daily andSSI.   -Respiratory insufficiency- resolved, on RA, Sat 100%   Antony Odea, PA-C [phone] 12/02/2019 7:48 AM  Discussed with HF team- wean milrinone as tolerated  Continue po diuretic transfer to 2c   Consider ip rehab soon I have seen and examined William Walker and agree with the above assessment  and plan.  [name] MD Beeper [phone] Office [phone] 12/02/2019 8:31 AM

## 2019-12-03 LAB — CBC
HCT: 27.2 % — ABNORMAL LOW (ref 39.0–52.0)
Hemoglobin: 8.8 g/dL — ABNORMAL LOW (ref 13.0–17.0)
MCH: 32.7 pg (ref 26.0–34.0)
MCHC: 32.4 g/dL (ref 30.0–36.0)
MCV: 101.1 fL — ABNORMAL HIGH (ref 80.0–100.0)
Platelets: 278 10*3/uL (ref 150–400)
RBC: 2.69 MIL/uL — ABNORMAL LOW (ref 4.22–5.81)
RDW: 17.8 % — ABNORMAL HIGH (ref 11.5–15.5)
WBC: 7.7 10*3/uL (ref 4.0–10.5)
nRBC: 0 % (ref 0.0–0.2)

## 2019-12-03 LAB — GLUCOSE, CAPILLARY
Glucose-Capillary: 119 mg/dL — ABNORMAL HIGH (ref 70–99)
Glucose-Capillary: 327 mg/dL — ABNORMAL HIGH (ref 70–99)
Glucose-Capillary: 76 mg/dL (ref 70–99)
Glucose-Capillary: 139 mg/dL — ABNORMAL HIGH (ref 70–99)

## 2019-12-03 LAB — COOXEMETRY PANEL
Carboxyhemoglobin: 1.2 % (ref 0.5–1.5)
Methemoglobin: 0.6 % (ref 0.0–1.5)
O2 Saturation: 58 %
Total hemoglobin: 9.7 g/dL — ABNORMAL LOW (ref 12.0–16.0)

## 2019-12-03 LAB — BASIC METABOLIC PANEL
Anion gap: 10 (ref 5–15)
BUN: 35 mg/dL — ABNORMAL HIGH (ref 8–23)
CO2: 26 mmol/L (ref 22–32)
Calcium: 8.3 mg/dL — ABNORMAL LOW (ref 8.9–10.3)
Chloride: 96 mmol/L — ABNORMAL LOW (ref 98–111)
Creatinine, Ser: 1.58 mg/dL — ABNORMAL HIGH (ref 0.61–1.24)
GFR calc Af Amer: 44 mL/min — ABNORMAL LOW (ref >60)
GFR calc non Af Amer: 38 mL/min — ABNORMAL LOW (ref >60)
Glucose, Bld: 115 mg/dL — ABNORMAL HIGH (ref 70–99)
Potassium: 3.7 mmol/L (ref 3.5–5.1)
Sodium: 132 mmol/L — ABNORMAL LOW (ref 135–145)

## 2019-12-03 MED ORDER — POTASSIUM CHLORIDE CRYS ER 20 MEQ PO TBCR
20.0000 meq | EXTENDED_RELEASE_TABLET | ORAL | Status: AC
  Administered 2019-12-03 (×3): 20 meq via ORAL
  Filled 2019-12-03 (×3): qty 1

## 2019-12-03 NOTE — Progress Notes (Signed)
Patient ID: William Walker, male   DOB: [birth year], 86 y.o.   MRN: [id number]     Advanced Heart Failure Rounding Note  PCP-Cardiologist: [name], MD  AHF: Dr. Haroldine Laws    Patient Profile   86 y/o male with HTN, DM, CAD admitted with NSTEMI found to have severe 3v CAD and severe AS with normal EF.   Underwent CABG/AVR 11/22/19.  AHF consulted for post-cardiotomy shock.   Subjective:    Remains on milrinone 0.2. Co-ox 58% Feels good. No SOB. Still with some LE edema. CVP 9-10  Objective:   Weight Range: 96.5 kg Body mass index is 29.67 kg/m.   Vital Signs:   Temp:  [97.8 F (36.6 C)-98.3 F (36.8 C)] 98.3 F (36.8 C) (07/02 0750) Pulse Rate:  [72-93] 79 (07/02 0700) Resp:  [12-19] 17 (07/02 0700) BP: (93-129)/(49-67) 103/53 (07/02 0600) SpO2:  [90 %-98 %] 95 % (07/02 0700) Weight:  [96.5 kg] 96.5 kg (07/02 0700) Last BM Date: 12/02/19 (small)  CVP:  [9 mmHg-12 mmHg] 10 mmHg   Weight change: Filed Weights   12/01/19 0545 12/02/19 0630 12/03/19 0700  Weight: 95.1 kg 95.5 kg 96.5 kg    Intake/Output:   Intake/Output Summary (Last 24 hours) at 12/03/2019 0810 Last data filed at 12/03/2019 0700 Gross per 24 hour  Intake 380.58 ml  Output 1275 ml  Net -894.42 ml      Physical Exam   General:  Elderly male sitting in chair No resp difficulty HEENT: normal Neck: supple. JVP 10 Carotids 2+ bilat; no bruits. No lymphadenopathy or thryomegaly appreciated. Cor: PMI nondisplaced. Regular rate & rhythm. No rubs, gallops or murmurs. Lungs: clear Abdomen: soft, nontender, nondistended. No hepatosplenomegaly. No bruits or masses. Good bowel sounds. Extremities: no cyanosis, clubbing, rash, 1-2+ edema UNNA wraps Neuro: alert & orientedx3, cranial nerves grossly intact. moves all 4 extremities w/o difficulty. Affect pleasant   Telemetry   NSR 70-80s Personally reviewed  Labs    CBC Recent Labs    12/02/19 0324 12/03/19 0457  WBC 7.2 7.7  HGB 8.8* 8.8*    HCT 26.3* 27.2*  MCV 98.1 101.1*  PLT 246 093   Basic Metabolic Panel Recent Labs    12/02/19 1417 12/03/19 0457  NA 129* 132*  K 4.0 3.7  CL 93* 96*  CO2 27 26  GLUCOSE 241* 115*  BUN 34* 35*  CREATININE 1.67* 1.58*  CALCIUM 8.0* 8.3*   Liver Function Tests Recent Labs    12/01/19 0433 12/02/19 0324  AST 57* 70*  ALT 181* 146*  ALKPHOS 74 61  BILITOT 1.0 1.2  PROT 5.1* 4.9*  ALBUMIN 2.8* 2.7*   No results for input(s): LIPASE, AMYLASE in the last 72 hours. Cardiac Enzymes No results for input(s): CKTOTAL, CKMB, CKMBINDEX, TROPONINI in the last 72 hours.  BNP: BNP (last 3 results) Recent Labs    11/15/19 1947  BNP 606.3*    ProBNP (last 3 results) No results for input(s): PROBNP in the last 8760 hours.   D-Dimer No results for input(s): DDIMER in the last 72 hours. Hemoglobin A1C No results for input(s): HGBA1C in the last 72 hours. Fasting Lipid Panel No results for input(s): CHOL, HDL, LDLCALC, TRIG, CHOLHDL, LDLDIRECT in the last 72 hours. Thyroid Function Tests No results for input(s): TSH, T4TOTAL, T3FREE, THYROIDAB in the last 72 hours.  Invalid input(s): FREET3  Other results:   Imaging    No results found.   Medications:     Scheduled Medications: .  aspirin EC  325 mg Oral Daily   Or  . aspirin  324 mg Per Tube Daily  . bisacodyl  10 mg Oral Daily   Or  . bisacodyl  10 mg Rectal Daily  . Chlorhexidine Gluconate Cloth  6 each Topical Daily  . docusate sodium  200 mg Oral Daily  . enoxaparin (LOVENOX) injection  30 mg Subcutaneous Q24H  . feeding supplement  1 Container Oral TID BM  . insulin aspart  0-24 Units Subcutaneous TID AC & HS  . insulin detemir  16 Units Subcutaneous Daily  . levothyroxine  50 mcg Oral QAC breakfast  . mouth rinse  15 mL Mouth Rinse BID  . midodrine  5 mg Oral TID WC  . multivitamin with minerals  1 tablet Oral Daily  . pantoprazole  40 mg Oral Daily  . potassium chloride  20 mEq Oral Q4H  .  sodium chloride flush  10-40 mL Intracatheter Q12H  . sodium chloride flush  3 mL Intravenous Q12H  . torsemide  40 mg Oral Daily    Infusions: . sodium chloride    . sodium chloride Stopped (11/24/19 1458)  . lactated ringers    . milrinone 0.2 mcg/kg/min (12/03/19 0700)    PRN Medications: dextrose, metoprolol tartrate, morphine injection, ondansetron (ZOFRAN) IV, oxyCODONE, sodium chloride flush, sodium chloride flush, traMADol     Assessment/Plan   1. Severe Multivessel CAD + critical AS - 11/22/2019 S/P CABG x 4 + bioprosthetic AVR - doing well. No s/s ischemia - continue ASA  - statin on hold for elevated LFTs (downtrending) - b-blocker on hold due to shock/low co-ox  2. Post Cardiotomy Shock  - ECHO EF 50-55% intraoperative.  - Echo 11/30/19 EF 60% RV ok - Remains on milrinone 0.2. Co-ox 58% (improved with CVP slightly higher) - Will stop milrinone and follow co-ox - Continue torsemide 40 daily (do not diurese too aggressively with RV dysfunction) - Weight up 2 pounds. Increase torsemide as needed - On midodrine 5 mg tid for BP support  - K 3.7 Supp K - can consider low-dose spiro as needed but don't want to drop BP   3. Anemia , expected blood loss.  - Hgb stable 8.8  3. AKI due to post-op ATN - Renal function ok.   Plateued - Creatinine improved to 1.58  4. DMII - On SSI  5. Hypokalemia - Supp K   Can go to 4E.   Glori Bickers MD 12/03/2019 8:10 AM

## 2019-12-03 NOTE — Progress Notes (Signed)
Physical Therapy Treatment Patient Details Name: William Walker MRN: [id number] DOB: 1926/12/05 Today's Date: 12/03/2019    History of Present Illness 86 yo admitted with chest pain with NSTEMI with severe AS and CAD s/p CABG/AVR 6/21. PMHx:HTN, DM, CAD    PT Comments    Pt pleasant, sitting in chair with daughter present on arrival stating getting some sleep really helped him. Pt with excellent gait progression this session with 180' with RW and chair follow. Pt remains with decreased awareness of precautions and continues to require assist for mobility but joking and in good spirits. Educated for HEP and continued progression. Pt will continue to benefit from CIR to reach mod I to return home with wife safely.   HR 84-94 92%on RA 103/61 (72) before 130/83 (98) after    Follow Up Recommendations  CIR     Equipment Recommendations  None recommended by PT    Recommendations for Other Services       Precautions / Restrictions Precautions Precautions: Fall;Sternal Precaution Comments: educated in sternal precautions during mobility Restrictions Weight Bearing Restrictions: Yes (Sternal precautions)    Mobility  Bed Mobility               General bed mobility comments: in chair on arrival  Transfers Overall transfer level: Needs assistance   Transfers: Sit to/from Stand Sit to Stand: Mod assist         General transfer comment: mod assist for initial stand from chair with repeated trials x 2 with min assist. Cues for hand placement and sequence with assist to scoot forward  Ambulation/Gait Ambulation/Gait assistance: Min assist;+2 safety/equipment Gait Distance (Feet): 180 Feet Assistive device: Rolling walker (2 wheeled) Gait Pattern/deviations: Trunk flexed;Shuffle;Decreased stride length   Gait velocity interpretation: 1.31 - 2.62 ft/sec, indicative of limited community ambulator General Gait Details: cues for looking up and stepping into RW. Pt walked 120'  then had a seated rest and walked additional 180' with slow cautious gait with RW   Stairs             Wheelchair Mobility    Modified Rankin (Stroke Patients Only)       Balance Overall balance assessment: Needs assistance   Sitting balance-Leahy Scale: Fair       Standing balance-Leahy Scale: Poor Standing balance comment: bil UE support on RW in standing                            Cognition Arousal/Alertness: Awake/alert Behavior During Therapy: WFL for tasks assessed/performed Overall Cognitive Status: Impaired/Different from baseline Area of Impairment: Problem solving;Memory                     Memory: Decreased recall of precautions;Decreased short-term memory       Problem Solving: Slow processing General Comments: requires cues for precautions       Exercises General Exercises - Lower Extremity Long Arc Quad: AROM;Both;Seated;15 reps Hip ABduction/ADduction: AROM;Both;15 reps;Seated Hip Flexion/Marching: AROM;Both;Seated;15 reps    General Comments        Pertinent Vitals/Pain Faces Pain Scale: Hurts a little bit Pain Location: belly Pain Descriptors / Indicators: Aching Pain Intervention(s): Limited activity within patient's tolerance;Repositioned    Home Living                      Prior Function            PT Goals (current goals can  now be found in the care plan section) Progress towards PT goals: Progressing toward goals    Frequency    Min 3X/week      PT Plan Current plan remains appropriate    Co-evaluation              AM-PAC PT "6 Clicks" Mobility   Outcome Measure  Help needed turning from your back to your side while in a flat bed without using bedrails?: A Little Help needed moving from lying on your back to sitting on the side of a flat bed without using bedrails?: A Little Help needed moving to and from a bed to a chair (including a wheelchair)?: A Lot Help needed standing  up from a chair using your arms (e.g., wheelchair or bedside chair)?: A Lot Help needed to walk in hospital room?: A Little Help needed climbing 3-5 steps with a railing? : A Lot 6 Click Score: 15    End of Session Equipment Utilized During Treatment: Gait belt Activity Tolerance: Patient tolerated treatment well Patient left: in chair;with call bell/phone within reach;with chair alarm set;with family/visitor present Nurse Communication: Mobility status;Precautions PT Visit Diagnosis: Other abnormalities of gait and mobility (R26.89);Difficulty in walking, not elsewhere classified (R26.2);Muscle weakness (generalized) (M62.81)     Time: 4451-4604 PT Time Calculation (min) (ACUTE ONLY): 26 min  Charges:  $Gait Training: 8-22 mins $Therapeutic Exercise: 8-22 mins                     [place], PT Acute Rehabilitation Services Pager: [phone] Office: [place] 12/03/2019, 11:22 AM

## 2019-12-03 NOTE — Progress Notes (Signed)
Inpatient Diabetes Program Recommendations  AACE/ADA: New Consensus Statement on Inpatient Glycemic Control (2015)  Target Ranges:  Prepandial:   less than 140 mg/dL      Peak postprandial:   less than 180 mg/dL (1-2 hours)      Critically ill patients:  140 - 180 mg/dL   Lab Results  Component Value Date   GLUCAP 327 (H) 12/03/2019   HGBA1C 7.4 (H) 11/15/2019    Review of Glycemic Control  Diabetes history: DM 2 Outpatient Diabetes medications: Glipizide 5 mg Daily Current orders for Inpatient glycemic control:  Levemir 16 units Daily Novolog 0-24 units tid and hs  Inpatient Diabetes Program Recommendations:    PO intake not enough to order meal coverage even though glucose trends increase after meal intake.  Did not get Novolog this am according to scale, now 300's at lunch.   Thanks,  Tama Headings RN, MSN, BC-ADM Inpatient Diabetes Coordinator Team Pager [phone] (8a-5p)

## 2019-12-03 NOTE — Progress Notes (Addendum)
TCTS DAILY ICU PROGRESS NOTE                   [address]            [place] [address]          [phone]   11 Days Post-Op Procedure(s) (LRB): CORONARY ARTERY BYPASS GRAFTING (CABG), ON PUMP, TIMES FOUR , USING LEFT INTERNAL MAMMARY ARTERY AND ENDOSCOPICALLY HARVESTED RIGHT GREATER SAPHENOUS VEIN (N/A) AORTIC VALVE REPLACEMENT (AVR) USING INSPIRIS 23MM (N/A) TRANSESOPHAGEAL ECHOCARDIOGRAM (TEE) (N/A)  Total Length of Stay:  LOS: 18 days   Subjective: Alert and oriented. Says he rested well and feels pretty good this morning.  Eating breakfast now. BM this morning.  Objective: Vital signs in last 24 hours: Temp:  [97.8 F (36.6 C)-98.1 F (36.7 C)] 98.1 F (36.7 C) (07/02 0645) Pulse Rate:  [72-93] 79 (07/02 0700) Cardiac Rhythm: Normal sinus rhythm (07/02 0400) Resp:  [12-19] 17 (07/02 0700) BP: (93-129)/(49-67) 103/53 (07/02 0600) SpO2:  [90 %-98 %] 95 % (07/02 0700) Weight:  [96.5 kg] 96.5 kg (07/02 0700)  Filed Weights   12/01/19 0545 12/02/19 0630 12/03/19 0700  Weight: 95.1 kg 95.5 kg 96.5 kg    Weight change: 1 kg   Hemodynamic parameters for last 24 hours: CVP:  [9 mmHg-12 mmHg] 9 mmHg  Intake/Output from previous day: 07/01 0701 - 07/02 0700 In: 388.2 [P.O.:240; I.V.:148.2] Out: 1275 [Urine:1275]  Intake/Output this shift: No intake/output data recorded.  Current Meds: Scheduled Meds: . aspirin EC  325 mg Oral Daily   Or  . aspirin  324 mg Per Tube Daily  . bisacodyl  10 mg Oral Daily   Or  . bisacodyl  10 mg Rectal Daily  . Chlorhexidine Gluconate Cloth  6 each Topical Daily  . docusate sodium  200 mg Oral Daily  . enoxaparin (LOVENOX) injection  30 mg Subcutaneous Q24H  . feeding supplement  1 Container Oral TID BM  . insulin aspart  0-24 Units Subcutaneous TID AC & HS  . insulin detemir  16 Units Subcutaneous Daily  . levothyroxine  50 mcg Oral QAC breakfast  . mouth rinse  15 mL Mouth Rinse BID  . midodrine  5 mg Oral  TID WC  . multivitamin with minerals  1 tablet Oral Daily  . pantoprazole  40 mg Oral Daily  . potassium chloride  20 mEq Oral Q4H  . sodium chloride flush  10-40 mL Intracatheter Q12H  . sodium chloride flush  3 mL Intravenous Q12H  . torsemide  40 mg Oral Daily   Continuous Infusions: . sodium chloride    . sodium chloride Stopped (11/24/19 1458)  . lactated ringers    . milrinone 0.2 mcg/kg/min (12/03/19 0700)   PRN Meds:.dextrose, metoprolol tartrate, morphine injection, ondansetron (ZOFRAN) IV, oxyCODONE, sodium chloride flush, sodium chloride flush, traMADol  General appearance: alert, cooperative and no distress Neurologic: intact Heart: regular rate and rhythm Lungs: breath sounds clear Extremities: Una boot to both LE's. Swelling in feet about the same.  Wound: Incision continue to heal with no sign of complication.   Lab Results: CBC: Recent Labs    12/02/19 0324 12/03/19 0457  WBC 7.2 7.7  HGB 8.8* 8.8*  HCT 26.3* 27.2*  PLT 246 278   BMET:  Recent Labs    12/02/19 1417 12/03/19 0457  NA 129* 132*  K 4.0 3.7  CL 93* 96*  CO2 27 26  GLUCOSE 241* 115*  BUN 34*  35*  CREATININE 1.67* 1.58*  CALCIUM 8.0* 8.3*    CMET: Lab Results  Component Value Date   WBC 7.7 12/03/2019   HGB 8.8 (L) 12/03/2019   HCT 27.2 (L) 12/03/2019   PLT 278 12/03/2019   GLUCOSE 115 (H) 12/03/2019   CHOL 166 11/16/2019   TRIG 88 11/16/2019   HDL 47 11/16/2019   LDLCALC 101 (H) 11/16/2019   ALT 146 (H) 12/02/2019   AST 70 (H) 12/02/2019   NA 132 (L) 12/03/2019   K 3.7 12/03/2019   CL 96 (L) 12/03/2019   CREATININE 1.58 (H) 12/03/2019   BUN 35 (H) 12/03/2019   CO2 26 12/03/2019   TSH 1.349 11/24/2019   INR 1.7 (H) 11/22/2019   HGBA1C 7.4 (H) 11/15/2019      PT/INR: No results for input(s): LABPROT, INR in the last 72 hours. Radiology: No results found.   Assessment/Plan: S/P Procedure(s) (LRB): CORONARY ARTERY BYPASS GRAFTING (CABG), ON PUMP, TIMES FOUR ,  USING LEFT INTERNAL MAMMARY ARTERY AND ENDOSCOPICALLY HARVESTED RIGHT GREATER SAPHENOUS VEIN (N/A) AORTIC VALVE REPLACEMENT (AVR) USING INSPIRIS 23MM (N/A) TRANSESOPHAGEAL ECHOCARDIOGRAM (TEE) (N/A)  -POD-11CABG x 4 and AVR for severe AS with post bypass cardiogenic shock. Hemodynamics improving,[id number] team slowly weaning milrinone. Transfer to [facility] when bed available. Continue PT. Will benefit from inpatient rehab eventually.    --Acute on chronic renal insufficiency with volume excess--renal function stable at baseline. Peripheral edema resolving.   -Expected acute blood loss anemia- hctstable,Monitor.   -Type 2 DM- Pre-op A1C 7.4. Glucosecontroladequate, having some spikes in glucose related to nutrition supplements.  ContinueLevemir to 16 units [place] daily andSSI.   -Bilateral pleural effusions- Small and stable on last CXR 6/30. F/U CXR in AM.     Antony Odea, PA-C [phone] 12/03/2019 7:48 AM  milrinone stopped per HF this am, follow coox IP rehab next 1-2 days  I have seen and examined William Walker and agree with the above assessment  and plan.  [name] MD Beeper [phone] Office [phone] 12/03/2019 8:46 AM

## 2019-12-03 NOTE — Plan of Care (Signed)
Stable during shift, slept well. BP stable. Remains on milrinone gtt, coox pending. Participating in ADLs, appetite increasing. Has 2C orders, awaiting bed assignment.    Problem: Education: Goal: Knowledge of General Education information will improve Description: Including pain rating scale, medication(s)/side effects and non-pharmacologic comfort measures Outcome: Progressing   Problem: Health Behavior/Discharge Planning: Goal: Ability to manage health-related needs will improve Outcome: Progressing   Problem: Clinical Measurements: Goal: Ability to maintain clinical measurements within normal limits will improve Outcome: Progressing Goal: Will remain free from infection Outcome: Progressing Goal: Diagnostic test results will improve Outcome: Progressing Goal: Cardiovascular complication will be avoided Outcome: Progressing   Problem: Activity: Goal: Risk for activity intolerance will decrease Outcome: Progressing   Problem: Nutrition: Goal: Adequate nutrition will be maintained Outcome: Progressing   Problem: Coping: Goal: Level of anxiety will decrease Outcome: Progressing   Problem: Pain Managment: Goal: General experience of comfort will improve Outcome: Progressing   Problem: Safety: Goal: Ability to remain free from injury will improve Outcome: Progressing   Problem: Skin Integrity: Goal: Risk for impaired skin integrity will decrease Outcome: Progressing   Problem: Education: Goal: Understanding of CV disease, CV risk reduction, and recovery process will improve Outcome: Progressing Goal: Individualized Educational Video(s) Outcome: Progressing   Problem: Activity: Goal: Ability to return to baseline activity level will improve Outcome: Progressing   Problem: Cardiovascular: Goal: Ability to achieve and maintain adequate cardiovascular perfusion will improve Outcome: Progressing Goal: Vascular access site(s) Level 0-1 will be maintained Outcome:  Progressing   Problem: Health Behavior/Discharge Planning: Goal: Ability to safely manage health-related needs after discharge will improve Outcome: Progressing

## 2019-12-03 NOTE — Progress Notes (Signed)
CARDIAC REHAB PHASE I   PRE:  Rate/Rhythm: 81 SR    BP: sitting 117/61    SaO2: 97 RA  MODE:  Ambulation: 100 ft, 270 ft   POST:  Rate/Rhythm: 94 SR    BP: sitting 160/90     SaO2: 98 RA  Pt ambulated for second time today. Min assist to stand with rocking and gait belt support. Used rollator, assist x2, gait belt. Pt fairly steady. Fatigued and braced rollator on wall with verbal cues after 100 ft. Mod assist to turn and sit. Rested 4 min. Able to stand mostly independently however after standing pt unsteady, tottering without hand support. Needed mod-max assist to turn around to hold to handles of rollator. Pt steady with rest of walk, paused x2 to rest and straighten his back. Return to recliner. BP elevated. Will f/u tomorrow, encouraged IS.  [phone]  [place], ACSM 12/03/2019 11:09 AM

## 2019-12-04 ENCOUNTER — Inpatient Hospital Stay (HOSPITAL_COMMUNITY): Payer: Medicare Other

## 2019-12-04 LAB — COOXEMETRY PANEL
Carboxyhemoglobin: 2 % — ABNORMAL HIGH (ref 0.5–1.5)
Carboxyhemoglobin: 1.9 % — ABNORMAL HIGH (ref 0.5–1.5)
Methemoglobin: 0.8 % (ref 0.0–1.5)
Methemoglobin: 1.1 % (ref 0.0–1.5)
O2 Saturation: 98 %
O2 Saturation: 68.7 %
Total hemoglobin: 8.7 g/dL — ABNORMAL LOW (ref 12.0–16.0)
Total hemoglobin: 9.5 g/dL — ABNORMAL LOW (ref 12.0–16.0)

## 2019-12-04 LAB — BASIC METABOLIC PANEL
Anion gap: 11 (ref 5–15)
BUN: 33 mg/dL — ABNORMAL HIGH (ref 8–23)
CO2: 26 mmol/L (ref 22–32)
Calcium: 8.4 mg/dL — ABNORMAL LOW (ref 8.9–10.3)
Chloride: 95 mmol/L — ABNORMAL LOW (ref 98–111)
Creatinine, Ser: 1.6 mg/dL — ABNORMAL HIGH (ref 0.61–1.24)
GFR calc Af Amer: 43 mL/min — ABNORMAL LOW (ref >60)
GFR calc non Af Amer: 37 mL/min — ABNORMAL LOW (ref >60)
Glucose, Bld: 162 mg/dL — ABNORMAL HIGH (ref 70–99)
Potassium: 3.8 mmol/L (ref 3.5–5.1)
Sodium: 132 mmol/L — ABNORMAL LOW (ref 135–145)

## 2019-12-04 LAB — GLUCOSE, CAPILLARY
Glucose-Capillary: 129 mg/dL — ABNORMAL HIGH (ref 70–99)
Glucose-Capillary: 148 mg/dL — ABNORMAL HIGH (ref 70–99)
Glucose-Capillary: 200 mg/dL — ABNORMAL HIGH (ref 70–99)
Glucose-Capillary: 114 mg/dL — ABNORMAL HIGH (ref 70–99)

## 2019-12-04 LAB — CBC
HCT: 27.7 % — ABNORMAL LOW (ref 39.0–52.0)
Hemoglobin: 9 g/dL — ABNORMAL LOW (ref 13.0–17.0)
MCH: 32.5 pg (ref 26.0–34.0)
MCHC: 32.5 g/dL (ref 30.0–36.0)
MCV: 100 fL (ref 80.0–100.0)
Platelets: 276 10*3/uL (ref 150–400)
RBC: 2.77 MIL/uL — ABNORMAL LOW (ref 4.22–5.81)
RDW: 18 % — ABNORMAL HIGH (ref 11.5–15.5)
WBC: 8.5 10*3/uL (ref 4.0–10.5)
nRBC: 0 % (ref 0.0–0.2)

## 2019-12-04 MED ORDER — ROSUVASTATIN CALCIUM 5 MG PO TABS
10.0000 mg | ORAL_TABLET | Freq: Every day | ORAL | Status: DC
  Administered 2019-12-04 – 2019-12-07 (×4): 10 mg via ORAL
  Filled 2019-12-04 (×4): qty 2

## 2019-12-04 MED ORDER — MIDODRINE HCL 5 MG PO TABS
10.0000 mg | ORAL_TABLET | Freq: Three times a day (TID) | ORAL | Status: DC
  Administered 2019-12-04 – 2019-12-07 (×12): 10 mg via ORAL
  Filled 2019-12-04 (×12): qty 2

## 2019-12-04 MED ORDER — TORSEMIDE 20 MG PO TABS
20.0000 mg | ORAL_TABLET | Freq: Every day | ORAL | Status: DC
  Administered 2019-12-04 – 2019-12-07 (×4): 20 mg via ORAL
  Filled 2019-12-04 (×4): qty 1

## 2019-12-04 NOTE — Progress Notes (Signed)
CARDIAC REHAB PHASE I   PRE:  Rate/Rhythm: 79 SR  BP:  Sitting: 116/54      SaO2: 94 RA  MODE:  Ambulation: 150 ft x2   POST:  Rate/Rhythm: 92 SR  BP:  Sitting: 129/69    SaO2: 93 RA  Pt mod assist to sit from lying position. Pt then ambulated 142ft in hallway assist of one with rollator and gait belt. Pt took a seated rest break c/o fatigue. Pt then able to return the 187ft to room and sat in recliner. Sats maintained throughout walk. Pt states his legs feel stronger. Brief d/c ed completed with pt and daughter. Reviewed site care and importance of monitoring incision daily. Encouraged continued IS use, walks, and sternal precautions. Pt given in-the-tube sheet. Will refer to [facility] [place], unsure if pt will be able to attend. Will f/u with pt Tuesday if he still remains in the hospital.  [phone] Rufina Falco, RN BSN 12/04/2019 11:43 AM

## 2019-12-04 NOTE — Progress Notes (Addendum)
[address]       [place] [address]             [phone]       12 Days Post-Op Procedure(s) (LRB): CORONARY ARTERY BYPASS GRAFTING (CABG), ON PUMP, TIMES FOUR , USING LEFT INTERNAL MAMMARY ARTERY AND ENDOSCOPICALLY HARVESTED RIGHT GREATER SAPHENOUS VEIN (N/A) AORTIC VALVE REPLACEMENT (AVR) USING INSPIRIS 23MM (N/A) TRANSESOPHAGEAL ECHOCARDIOGRAM (TEE) (N/A) Subjective: Awake and alert, did not rest as well last night bou otherwise no new concerns.  Milrinone discontinued yesterday. CoOx 68 today.  Remains on RA with adequate sats.  BM yesterday morning.  Objective: Vital signs in last 24 hours: Temp:  [97.6 F (36.4 C)-98.4 F (36.9 C)] 97.6 F (36.4 C) (07/03 0800) Pulse Rate:  [71-93] 79 (07/03 0800) Cardiac Rhythm: Heart block (07/03 0716) Resp:  [15-20] 20 (07/03 0800) BP: (90-127)/(34-83) 102/60 (07/03 0800) SpO2:  [94 %-99 %] 96 % (07/03 0800) Weight:  [96.6 kg] 96.6 kg (07/03 0521)  Hemodynamic parameters for last 24 hours: CVP:  [9 mmHg-15 mmHg] 15 mmHg  Intake/Output from previous day: 07/02 0701 - 07/03 0700 In: 1053 [P.O.:1040; I.V.:13] Out: 2100 [Urine:2100] Intake/Output this shift: Total I/O In: 240 [P.O.:240] Out: -   General appearance: alert, cooperative and no distress Neurologic: intact Heart: regular rate and rhythm Lungs: breath sounds clear Extremities: Unna boot to both LE's. Swelling in feet about the same.  Wound: Incision continue to heal with no sign of complication.   Lab Results: Recent Labs    12/03/19 0457 12/04/19 0946  WBC 7.7 8.5  HGB 8.8* 9.0*  HCT 27.2* 27.7*  PLT 278 276   BMET:  Recent Labs    12/03/19 0457 12/04/19 0946  NA 132* 132*  K 3.7 3.8  CL 96* 95*  CO2 26 26  GLUCOSE 115* 162*  BUN 35* 33*  CREATININE 1.58* 1.60*  CALCIUM 8.3* 8.4*    PT/INR: No results for input(s): LABPROT, INR in the last 72 hours. ABG    Component Value Date/Time   PHART 7.323 (L) 11/23/2019  0211   HCO3 18.4 (L) 11/23/2019 0211   TCO2 19 (L) 11/23/2019 0211   ACIDBASEDEF 7.0 (H) 11/23/2019 0211   O2SAT 68.7 12/04/2019 0536   CBG (last 3)  Recent Labs    12/03/19 1552 12/03/19 2055 12/04/19 0615  GLUCAP 76 139* 129*    EXAM: PORTABLE CHEST 1 VIEW  COMPARISON:  12/01/2019  FINDINGS: Median sternotomy, valve replacement, and CABG. The heart is enlarged but stable in configuration. There is atherosclerotic calcification of the thoracic BB aorta. RIGHT-sided PICC line tip overlies the superior vena cava.  Bilateral pleural effusions and bibasilar opacities are similar to previous. No pneumothorax.  IMPRESSION: 1. Stable cardiomegaly. 2. Stable bilateral pleural effusions and bibasilar opacities.   Electronically Signed   By: Nolon Nations M.D.   On: 12/04/2019 09:40  Assessment/Plan: S/P Procedure(s) (LRB): CORONARY ARTERY BYPASS GRAFTING (CABG), ON PUMP, TIMES FOUR , USING LEFT INTERNAL MAMMARY ARTERY AND ENDOSCOPICALLY HARVESTED RIGHT GREATER SAPHENOUS VEIN (N/A) AORTIC VALVE REPLACEMENT (AVR) USING INSPIRIS 23MM (N/A) TRANSESOPHAGEAL ECHOCARDIOGRAM (TEE) (N/A)  -POD-12CABG x 4 and AVR for severe AS with post bypass cardiogenic shock. Hemodynamics improving,CoOx68this AM off milrinone.  Midodrine increased per HF for SBP 90's-100. Marland Kitchen Continue PT. Will benefit from inpatient rehab eventually.   --Acute on chronic renal insufficiency with volume excess--renal function stable at baseline. Peripheral edema resolving.   -Expected acute blood loss anemia- hctstable,Monitor.   -  Type 2 DM- Pre-op A1C 7.4. Glucosecontroladequate,having some spikes in glucose related to nutrition supplements. ContinueLevemir to 16 units [place] daily andSSI.   -Bilateral pleural effusions- CXR this am reviewed, effusions remain small and stable.    LOS: 19 days    William Walker , [place] [phone] 12/04/2019  patient examined, today's chest  x-ray and medical record reviewed,agree with above note.  Walked 100 feet twice today so will probably be able to discharge home on Monday Tharon Aquas Trigt III 12/04/2019

## 2019-12-04 NOTE — Progress Notes (Signed)
Patient ID: William Walker, male   DOB: 04-08-1927, 86 y.o.   MRN: [id number]     Advanced Heart Failure Rounding Note  PCP-Cardiologist: [name], MD  AHF: Dr. Haroldine Laws     Subjective:    Milrinone stopped yesterday. Co-ox 69% Feels ok. No CP or SOB.   Objective:   Weight Range: 96.6 kg Body mass index is 29.69 kg/m.   Vital Signs:   Temp:  [98 F (36.7 C)-98.4 F (36.9 C)] 98 F (36.7 C) (07/03 0319) Pulse Rate:  [71-93] 91 (07/03 0319) Resp:  [15-38] 18 (07/03 0319) BP: (90-130)/(34-83) 91/56 (07/03 0319) SpO2:  [94 %-99 %] 95 % (07/03 0319) Weight:  [96.6 kg] 96.6 kg (07/03 0521) Last BM Date: 12/03/19  CVP:  [9 mmHg-15 mmHg] 15 mmHg   Weight change: Filed Weights   12/02/19 0630 12/03/19 0700 12/04/19 0521  Weight: 95.5 kg 96.5 kg 96.6 kg    Intake/Output:   Intake/Output Summary (Last 24 hours) at 12/04/2019 0705 Last data filed at 12/04/2019 0321 Gross per 24 hour  Intake 1053 ml  Output 2100 ml  Net -1047 ml      Physical Exam   General:  Elderly male lying in bed  HEENT: normal Neck: supple. JVP 6-7Carotids 2+ bilat; no bruits. No lymphadenopathy or thryomegaly appreciated. Cor: Incision ok PMI nondisplaced. Regular rate & rhythm. No rubs, gallops or murmurs. Lungs: clear Abdomen: soft, nontender, nondistended. No hepatosplenomegaly. No bruits or masses. Good bowel sounds. Extremities: no cyanosis, clubbing, rash, 1+ pedal edema + UNNA Neuro: alert & orientedx3, cranial nerves grossly intact. moves all 4 extremities w/o difficulty. Affect pleasant   Telemetry   NSR 80-90s Personally reviewed  Labs    CBC Recent Labs    12/02/19 0324 12/03/19 0457  WBC 7.2 7.7  HGB 8.8* 8.8*  HCT 26.3* 27.2*  MCV 98.1 101.1*  PLT 246 010   Basic Metabolic Panel Recent Labs    12/02/19 1417 12/03/19 0457  NA 129* 132*  K 4.0 3.7  CL 93* 96*  CO2 27 26  GLUCOSE 241* 115*  BUN 34* 35*  CREATININE 1.67* 1.58*  CALCIUM 8.0* 8.3*   Liver  Function Tests Recent Labs    12/02/19 0324  AST 70*  ALT 146*  ALKPHOS 61  BILITOT 1.2  PROT 4.9*  ALBUMIN 2.7*   No results for input(s): LIPASE, AMYLASE in the last 72 hours. Cardiac Enzymes No results for input(s): CKTOTAL, CKMB, CKMBINDEX, TROPONINI in the last 72 hours.  BNP: BNP (last 3 results) Recent Labs    11/15/19 1947  BNP 606.3*    ProBNP (last 3 results) No results for input(s): PROBNP in the last 8760 hours.   D-Dimer No results for input(s): DDIMER in the last 72 hours. Hemoglobin A1C No results for input(s): HGBA1C in the last 72 hours. Fasting Lipid Panel No results for input(s): CHOL, HDL, LDLCALC, TRIG, CHOLHDL, LDLDIRECT in the last 72 hours. Thyroid Function Tests No results for input(s): TSH, T4TOTAL, T3FREE, THYROIDAB in the last 72 hours.  Invalid input(s): FREET3  Other results:   Imaging    No results found.   Medications:     Scheduled Medications: . aspirin EC  325 mg Oral Daily   Or  . aspirin  324 mg Per Tube Daily  . bisacodyl  10 mg Oral Daily   Or  . bisacodyl  10 mg Rectal Daily  . Chlorhexidine Gluconate Cloth  6 each Topical Daily  . docusate sodium  200 mg Oral Daily  . enoxaparin (LOVENOX) injection  30 mg Subcutaneous Q24H  . feeding supplement  1 Container Oral TID BM  . insulin aspart  0-24 Units Subcutaneous TID AC & HS  . insulin detemir  16 Units Subcutaneous Daily  . levothyroxine  50 mcg Oral QAC breakfast  . mouth rinse  15 mL Mouth Rinse BID  . midodrine  5 mg Oral TID WC  . multivitamin with minerals  1 tablet Oral Daily  . pantoprazole  40 mg Oral Daily  . sodium chloride flush  10-40 mL Intracatheter Q12H  . sodium chloride flush  3 mL Intravenous Q12H  . torsemide  40 mg Oral Daily    Infusions: . sodium chloride    . sodium chloride Stopped (11/24/19 1458)  . lactated ringers      PRN Medications: dextrose, metoprolol tartrate, morphine injection, ondansetron (ZOFRAN) IV, oxyCODONE,  sodium chloride flush, sodium chloride flush, traMADol     Assessment/Plan   1. Severe Multivessel CAD + critical AS - 11/22/2019 S/P CABG x 4 + bioprosthetic AVR - doing well. No s/s ischemia - continue ASA  - start statin - b-blocker on hold due to shock/low co-ox  2. Post Cardiotomy Shock  - ECHO EF 50-55% intraoperative.  - Echo 11/30/19 EF 60% RV ok - Milrinone off. Co-ox stable 69% - Volume status still slightly elevate d (weight up about 7 pounds from baseline) - Increase torsemide from 40 daily -> 40/20 - On midodrine 5 mg tid for BP support. Will increase to 10 tid - Needs BMET  3. Anemia , expected blood loss.  - Hgb stable 9.5  3. AKI due to post-op ATN - Renal function ok.   Plateued - Creatinine improved to 1.58 - Needs bMET today  4. DMII - On SSI  5. Hypokalemia - Needs BMET today   We will follow from a distance.  Should be ready for home soon.   Glori Bickers MD 12/04/2019 7:05 AM

## 2019-12-05 LAB — COOXEMETRY PANEL
Carboxyhemoglobin: 1.6 % — ABNORMAL HIGH (ref 0.5–1.5)
Methemoglobin: 1.2 % (ref 0.0–1.5)
O2 Saturation: 53.4 %
Total hemoglobin: 9.7 g/dL — ABNORMAL LOW (ref 12.0–16.0)

## 2019-12-05 LAB — CBC
HCT: 28.8 % — ABNORMAL LOW (ref 39.0–52.0)
Hemoglobin: 9.4 g/dL — ABNORMAL LOW (ref 13.0–17.0)
MCH: 32.8 pg (ref 26.0–34.0)
MCHC: 32.6 g/dL (ref 30.0–36.0)
MCV: 100.3 fL — ABNORMAL HIGH (ref 80.0–100.0)
Platelets: 314 10*3/uL (ref 150–400)
RBC: 2.87 MIL/uL — ABNORMAL LOW (ref 4.22–5.81)
RDW: 17.9 % — ABNORMAL HIGH (ref 11.5–15.5)
WBC: 7.1 10*3/uL (ref 4.0–10.5)
nRBC: 0 % (ref 0.0–0.2)

## 2019-12-05 LAB — GLUCOSE, CAPILLARY
Glucose-Capillary: 91 mg/dL (ref 70–99)
Glucose-Capillary: 213 mg/dL — ABNORMAL HIGH (ref 70–99)
Glucose-Capillary: 125 mg/dL — ABNORMAL HIGH (ref 70–99)
Glucose-Capillary: 160 mg/dL — ABNORMAL HIGH (ref 70–99)

## 2019-12-05 LAB — BASIC METABOLIC PANEL
Anion gap: 10 (ref 5–15)
BUN: 30 mg/dL — ABNORMAL HIGH (ref 8–23)
CO2: 29 mmol/L (ref 22–32)
Calcium: 8.5 mg/dL — ABNORMAL LOW (ref 8.9–10.3)
Chloride: 97 mmol/L — ABNORMAL LOW (ref 98–111)
Creatinine, Ser: 1.46 mg/dL — ABNORMAL HIGH (ref 0.61–1.24)
GFR calc Af Amer: 48 mL/min — ABNORMAL LOW (ref >60)
GFR calc non Af Amer: 41 mL/min — ABNORMAL LOW (ref >60)
Glucose, Bld: 105 mg/dL — ABNORMAL HIGH (ref 70–99)
Potassium: 3.2 mmol/L — ABNORMAL LOW (ref 3.5–5.1)
Sodium: 136 mmol/L (ref 135–145)

## 2019-12-05 MED ORDER — POTASSIUM CHLORIDE CRYS ER 20 MEQ PO TBCR
40.0000 meq | EXTENDED_RELEASE_TABLET | Freq: Every day | ORAL | Status: DC
  Administered 2019-12-05 – 2019-12-07 (×3): 40 meq via ORAL
  Filled 2019-12-05 (×3): qty 2

## 2019-12-05 NOTE — Progress Notes (Signed)
Patient ID: William Walker, male   DOB: 09-26-1926, 87 y.o.   MRN: [id number]     Advanced Heart Failure Rounding Note  PCP-Cardiologist: [name], MD  AHF: Dr. Haroldine Laws     Subjective:    Feels good. Denies CP or SOB. Co-ox 53% K 3.2. Weight stable at 212. CVP 5  Objective:   Weight Range: 96.2 kg Body mass index is 29.58 kg/m.   Vital Signs:   Temp:  [97.6 F (36.4 C)-98.7 F (37.1 C)] 98.2 F (36.8 C) (07/04 1126) Pulse Rate:  [70-80] 74 (07/04 1126) Resp:  [14-19] 14 (07/04 1126) BP: (104-131)/(46-77) 110/51 (07/04 1126) SpO2:  [94 %-100 %] 96 % (07/04 1126) Weight:  [96.2 kg] 96.2 kg (07/04 0400) Last BM Date: 12/04/19  CVP:  [7 mmHg-12 mmHg] 7 mmHg   Weight change: Filed Weights   12/03/19 0700 12/04/19 0521 12/05/19 0400  Weight: 96.5 kg 96.6 kg 96.2 kg    Intake/Output:   Intake/Output Summary (Last 24 hours) at 12/05/2019 1326 Last data filed at 12/05/2019 0730 Gross per 24 hour  Intake 720 ml  Output 2600 ml  Net -1880 ml      Physical Exam   General:  Sitting in chair No resp difficulty HEENT: normal Neck: supple. no JVD. Carotids 2+ bilat; no bruits. No lymphadenopathy or thryomegaly appreciated. Cor: PMI nondisplaced. Regular rate & rhythm. No rubs, gallops or murmurs. Sternal incision ok  Lungs: clear Abdomen: soft, nontender, nondistended. No hepatosplenomegaly. No bruits or masses. Good bowel sounds. Extremities: no cyanosis, clubbing, rash, 1+ pedal edema + UNNA and teds Neuro: alert & orientedx3, cranial nerves grossly intact. moves all 4 extremities w/o difficulty. Affect pleasant   Telemetry   NSR 70-80s Personally reviewed  Labs    CBC Recent Labs    12/04/19 0946 12/05/19 0430  WBC 8.5 7.1  HGB 9.0* 9.4*  HCT 27.7* 28.8*  MCV 100.0 100.3*  PLT 276 921   Basic Metabolic Panel Recent Labs    12/04/19 0946 12/05/19 0430  NA 132* 136  K 3.8 3.2*  CL 95* 97*  CO2 26 29  GLUCOSE 162* 105*  BUN 33* 30*    CREATININE 1.60* 1.46*  CALCIUM 8.4* 8.5*   Liver Function Tests No results for input(s): AST, ALT, ALKPHOS, BILITOT, PROT, ALBUMIN in the last 72 hours. No results for input(s): LIPASE, AMYLASE in the last 72 hours. Cardiac Enzymes No results for input(s): CKTOTAL, CKMB, CKMBINDEX, TROPONINI in the last 72 hours.  BNP: BNP (last 3 results) Recent Labs    11/15/19 1947  BNP 606.3*    ProBNP (last 3 results) No results for input(s): PROBNP in the last 8760 hours.   D-Dimer No results for input(s): DDIMER in the last 72 hours. Hemoglobin A1C No results for input(s): HGBA1C in the last 72 hours. Fasting Lipid Panel No results for input(s): CHOL, HDL, LDLCALC, TRIG, CHOLHDL, LDLDIRECT in the last 72 hours. Thyroid Function Tests No results for input(s): TSH, T4TOTAL, T3FREE, THYROIDAB in the last 72 hours.  Invalid input(s): FREET3  Other results:   Imaging    No results found.   Medications:     Scheduled Medications: . aspirin EC  325 mg Oral Daily   Or  . aspirin  324 mg Per Tube Daily  . bisacodyl  10 mg Oral Daily   Or  . bisacodyl  10 mg Rectal Daily  . Chlorhexidine Gluconate Cloth  6 each Topical Daily  . docusate sodium  200  mg Oral Daily  . enoxaparin (LOVENOX) injection  30 mg Subcutaneous Q24H  . feeding supplement  1 Container Oral TID BM  . insulin aspart  0-24 Units Subcutaneous TID AC & HS  . insulin detemir  16 Units Subcutaneous Daily  . levothyroxine  50 mcg Oral QAC breakfast  . mouth rinse  15 mL Mouth Rinse BID  . midodrine  10 mg Oral TID WC  . multivitamin with minerals  1 tablet Oral Daily  . pantoprazole  40 mg Oral Daily  . rosuvastatin  10 mg Oral Daily  . sodium chloride flush  10-40 mL Intracatheter Q12H  . sodium chloride flush  3 mL Intravenous Q12H  . torsemide  20 mg Oral Daily  . torsemide  40 mg Oral Daily    Infusions: . sodium chloride    . sodium chloride Stopped (11/24/19 1458)  . lactated ringers       PRN Medications: dextrose, metoprolol tartrate, morphine injection, ondansetron (ZOFRAN) IV, oxyCODONE, sodium chloride flush, sodium chloride flush, traMADol     Assessment/Plan   1. Severe Multivessel CAD + critical AS - 11/22/2019 S/P CABG x 4 + bioprosthetic AVR - doing well. No s/s ischemia - continue ASA and statin - b-blocker on hold due to shock/low co-ox  2. Post Cardiotomy Shock  - ECHO EF 50-55% intraoperative.  - Echo 11/30/19 EF 60% RV ok - Milrinone off. Co-ox 69%-> 54% - Volume status slightly elevated but co-ox drops if we pus too hard.  - Continue torsemide 40/20 - On midodrine 10 mg tid for BP support.  - Creatinine stable 1.5  3. Anemia , expected blood loss.  - Hgb stable 9.4  3. AKI due to post-op ATN - Renal function ok.   Plateued - Creatinine improved to 1.46  4. DMII - On SSI  5. Hypokalemia - Supp K  Pending CIR placement. D/w Dr. Erling Cruz MD 12/05/2019 1:26 PM

## 2019-12-05 NOTE — Progress Notes (Signed)
      [address]       [place] [address]             [phone]       13 Days Post-Op Procedure(s) (LRB): CORONARY ARTERY BYPASS GRAFTING (CABG), ON PUMP, TIMES FOUR , USING LEFT INTERNAL MAMMARY ARTERY AND ENDOSCOPICALLY HARVESTED RIGHT GREATER SAPHENOUS VEIN (N/A) AORTIC VALVE REPLACEMENT (AVR) USING INSPIRIS 23MM (N/A) TRANSESOPHAGEAL ECHOCARDIOGRAM (TEE) (N/A) Subjective: Awake and alert, no new concerns. Feels like leg swelling is decreasing.  Has no new concerns today.   Objective: Vital signs in last 24 hours: Temp:  [97.6 F (36.4 C)-98.7 F (37.1 C)] 98.2 F (36.8 C) (07/04 1126) Pulse Rate:  [70-80] 74 (07/04 1126) Cardiac Rhythm: Heart block (07/04 0735) Resp:  [14-19] 14 (07/04 1126) BP: (104-131)/(46-77) 110/51 (07/04 1126) SpO2:  [94 %-100 %] 96 % (07/04 1126) Weight:  [96.2 kg] 96.2 kg (07/04 0400)  Hemodynamic parameters for last 24 hours: CVP:  [7 mmHg-12 mmHg] 7 mmHg  Intake/Output from previous day: 07/03 0701 - 07/04 0700 In: 720 [P.O.:720] Out: 2400 [Urine:2400] Intake/Output this shift: Total I/O In: 240 [P.O.:240] Out: 200 [Urine:200]  General appearance:alert, cooperative and no distress Neurologic:intact Heart:regular rate and rhythm Lungs:breath sounds clear Extremities:Unna boot and TEDs to both LE's. Swelling in feet is slowly decreasing. Wound:Incisions continue to heal with no sign of complication.  Lab Results: Recent Labs    12/04/19 0946 12/05/19 0430  WBC 8.5 7.1  HGB 9.0* 9.4*  HCT 27.7* 28.8*  PLT 276 314   BMET:  Recent Labs    12/04/19 0946 12/05/19 0430  NA 132* 136  K 3.8 3.2*  CL 95* 97*  CO2 26 29  GLUCOSE 162* 105*  BUN 33* 30*  CREATININE 1.60* 1.46*  CALCIUM 8.4* 8.5*    PT/INR: No results for input(s): LABPROT, INR in the last 72 hours. ABG    Component Value Date/Time   PHART 7.323 (L) 11/23/2019 0211   HCO3 18.4 (L) 11/23/2019 0211   TCO2 19 (L) 11/23/2019 0211    ACIDBASEDEF 7.0 (H) 11/23/2019 0211   O2SAT 53.4 12/05/2019 0430   CBG (last 3)  Recent Labs    12/04/19 2151 12/05/19 0613 12/05/19 1137  GLUCAP 114* 91 213*    Assessment/Plan: S/P Procedure(s) (LRB): CORONARY ARTERY BYPASS GRAFTING (CABG), ON PUMP, TIMES FOUR , USING LEFT INTERNAL MAMMARY ARTERY AND ENDOSCOPICALLY HARVESTED RIGHT GREATER SAPHENOUS VEIN (N/A) AORTIC VALVE REPLACEMENT (AVR) USING INSPIRIS 23MM (N/A) TRANSESOPHAGEAL ECHOCARDIOGRAM (TEE) (N/A)  -POD-13CABG x 4 and AVR for severe AS with post bypass cardiogenic shock.  Remains hemodynamically stable and is slowly progressing with mobility. He says he has the most difficulty with transitions. Continue PT. Will benefit from inpatient rehab eventually.Evaluation for CIR initiatedand insurance approval requested. Will likely not get decision before Tuesday.   --Acute on chronic renal insufficiency with volume excess--renal function stable at baseline. Peripheral edema resolving.  -Expected acute blood loss anemia- hctimproving,Monitor.   -Hypokalemia- supplement ordered.   -Type 2 DM- Pre-op A1C 7.4. Glucosecontroladequate,ContinueLevemir to 16 units  daily andSSI.    LOS: 20 days    Antony Odea, PA-C [phone] 12/05/2019

## 2019-12-06 LAB — COOXEMETRY PANEL
Carboxyhemoglobin: 1.4 % (ref 0.5–1.5)
Methemoglobin: 0.6 % (ref 0.0–1.5)
O2 Saturation: 57.8 %
Total hemoglobin: 9.3 g/dL — ABNORMAL LOW (ref 12.0–16.0)

## 2019-12-06 LAB — GLUCOSE, CAPILLARY
Glucose-Capillary: 114 mg/dL — ABNORMAL HIGH (ref 70–99)
Glucose-Capillary: 226 mg/dL — ABNORMAL HIGH (ref 70–99)
Glucose-Capillary: 127 mg/dL — ABNORMAL HIGH (ref 70–99)
Glucose-Capillary: 140 mg/dL — ABNORMAL HIGH (ref 70–99)

## 2019-12-06 LAB — BASIC METABOLIC PANEL
Anion gap: 12 (ref 5–15)
BUN: 34 mg/dL — ABNORMAL HIGH (ref 8–23)
CO2: 27 mmol/L (ref 22–32)
Calcium: 8.3 mg/dL — ABNORMAL LOW (ref 8.9–10.3)
Chloride: 95 mmol/L — ABNORMAL LOW (ref 98–111)
Creatinine, Ser: 1.53 mg/dL — ABNORMAL HIGH (ref 0.61–1.24)
GFR calc Af Amer: 45 mL/min — ABNORMAL LOW (ref >60)
GFR calc non Af Amer: 39 mL/min — ABNORMAL LOW (ref >60)
Glucose, Bld: 136 mg/dL — ABNORMAL HIGH (ref 70–99)
Potassium: 3.2 mmol/L — ABNORMAL LOW (ref 3.5–5.1)
Sodium: 134 mmol/L — ABNORMAL LOW (ref 135–145)

## 2019-12-06 LAB — CBC
HCT: 27.4 % — ABNORMAL LOW (ref 39.0–52.0)
Hemoglobin: 8.8 g/dL — ABNORMAL LOW (ref 13.0–17.0)
MCH: 32.2 pg (ref 26.0–34.0)
MCHC: 32.1 g/dL (ref 30.0–36.0)
MCV: 100.4 fL — ABNORMAL HIGH (ref 80.0–100.0)
Platelets: 287 10*3/uL (ref 150–400)
RBC: 2.73 MIL/uL — ABNORMAL LOW (ref 4.22–5.81)
RDW: 18 % — ABNORMAL HIGH (ref 11.5–15.5)
WBC: 7 10*3/uL (ref 4.0–10.5)
nRBC: 0 % (ref 0.0–0.2)

## 2019-12-06 MED ORDER — POTASSIUM CHLORIDE CRYS ER 20 MEQ PO TBCR
40.0000 meq | EXTENDED_RELEASE_TABLET | Freq: Two times a day (BID) | ORAL | Status: AC
  Administered 2019-12-06 (×2): 40 meq via ORAL
  Filled 2019-12-06 (×2): qty 2

## 2019-12-06 NOTE — Progress Notes (Signed)
Patient ID: William Walker, male   DOB: 08-17-1926, 86 y.o.   MRN: [id number]     Advanced Heart Failure Rounding Note  PCP-Cardiologist: [name], MD  AHF: Dr. Haroldine Laws     Subjective:    Feels good.  No CP or SOB.  CVP 7. Co-ox 58%  BP stable on midodrine 10 tid  Objective:   Weight Range: 96 kg Body mass index is 29.52 kg/m.   Vital Signs:   Temp:  [97.7 F (36.5 C)-98.3 F (36.8 C)] 98.2 F (36.8 C) (07/05 1145) Pulse Rate:  [70-80] 80 (07/05 1145) Resp:  [15-20] 20 (07/05 1145) BP: (102-128)/(55-71) 128/61 (07/05 1145) SpO2:  [96 %-99 %] 97 % (07/05 1145) Weight:  [96 kg] 96 kg (07/05 0500) Last BM Date: 12/04/19  CVP:  [7 mmHg-10 mmHg] 7 mmHg   Weight change: Filed Weights   12/04/19 0521 12/05/19 0400 12/06/19 0500  Weight: 96.6 kg 96.2 kg 96 kg    Intake/Output:   Intake/Output Summary (Last 24 hours) at 12/06/2019 1201 Last data filed at 12/06/2019 0719 Gross per 24 hour  Intake 240 ml  Output 1675 ml  Net -1435 ml      Physical Exam   General:  Sitting in chair. No resp difficulty HEENT: normal Neck: supple. JVP 7 Carotids 2+ bilat; no bruits. No lymphadenopathy or thryomegaly appreciated. Cor: Sternal incision ok PMI nondisplaced. Regular rate & rhythm. No rubs, gallops or murmurs. Lungs: clear Abdomen: soft, nontender, nondistended. No hepatosplenomegaly. No bruits or masses. Good bowel sounds. Extremities: no cyanosis, clubbing, rash, 1+ feet edema + TED/unna Neuro: alert & orientedx3, cranial nerves grossly intact. moves all 4 extremities w/o difficulty. Affect pleasant   Telemetry   NSR 80s Personally reviewed  Labs    CBC Recent Labs    12/05/19 0430 12/06/19 0410  WBC 7.1 7.0  HGB 9.4* 8.8*  HCT 28.8* 27.4*  MCV 100.3* 100.4*  PLT 314 782   Basic Metabolic Panel Recent Labs    12/05/19 0430 12/06/19 0410  NA 136 134*  K 3.2* 3.2*  CL 97* 95*  CO2 29 27  GLUCOSE 105* 136*  BUN 30* 34*  CREATININE 1.46* 1.53*    CALCIUM 8.5* 8.3*   Liver Function Tests No results for input(s): AST, ALT, ALKPHOS, BILITOT, PROT, ALBUMIN in the last 72 hours. No results for input(s): LIPASE, AMYLASE in the last 72 hours. Cardiac Enzymes No results for input(s): CKTOTAL, CKMB, CKMBINDEX, TROPONINI in the last 72 hours.  BNP: BNP (last 3 results) Recent Labs    11/15/19 1947  BNP 606.3*    ProBNP (last 3 results) No results for input(s): PROBNP in the last 8760 hours.   D-Dimer No results for input(s): DDIMER in the last 72 hours. Hemoglobin A1C No results for input(s): HGBA1C in the last 72 hours. Fasting Lipid Panel No results for input(s): CHOL, HDL, LDLCALC, TRIG, CHOLHDL, LDLDIRECT in the last 72 hours. Thyroid Function Tests No results for input(s): TSH, T4TOTAL, T3FREE, THYROIDAB in the last 72 hours.  Invalid input(s): FREET3  Other results:   Imaging    No results found.   Medications:     Scheduled Medications:  aspirin EC  325 mg Oral Daily   Or   aspirin  324 mg Per Tube Daily   bisacodyl  10 mg Oral Daily   Or   bisacodyl  10 mg Rectal Daily   Chlorhexidine Gluconate Cloth  6 each Topical Daily   docusate sodium  200  mg Oral Daily   enoxaparin (LOVENOX) injection  30 mg Subcutaneous Q24H   feeding supplement  1 Container Oral TID BM   insulin aspart  0-24 Units Subcutaneous TID AC & HS   insulin detemir  16 Units Subcutaneous Daily   levothyroxine  50 mcg Oral QAC breakfast   mouth rinse  15 mL Mouth Rinse BID   midodrine  10 mg Oral TID WC   multivitamin with minerals  1 tablet Oral Daily   pantoprazole  40 mg Oral Daily   potassium chloride  40 mEq Oral Daily   potassium chloride  40 mEq Oral BID   rosuvastatin  10 mg Oral Daily   sodium chloride flush  10-40 mL Intracatheter Q12H   sodium chloride flush  3 mL Intravenous Q12H   torsemide  20 mg Oral Daily   torsemide  40 mg Oral Daily    Infusions:  sodium chloride     sodium  chloride Stopped (11/24/19 1458)   lactated ringers      PRN Medications: dextrose, metoprolol tartrate, morphine injection, ondansetron (ZOFRAN) IV, oxyCODONE, sodium chloride flush, sodium chloride flush, traMADol     Assessment/Plan   1. Severe Multivessel CAD + critical AS - 11/22/2019 S/P CABG x 4 + bioprosthetic AVR - doing well. No s/s ischemia - continue ASA and statin - b-blocker on hold due to shock/low co-ox  2. Post Cardiotomy Shock  - ECHO EF 50-55% intraoperative.  - Echo 11/30/19 EF 60% RV ok - Milrinone off. Co-ox 69%-> 54% -> 58% - Volume status ok - Continue torsemide 40/20 - On midodrine 10 mg tid for BP support.  - Creatinine stable at 1.5  3. Anemia , expected blood loss.  - Hgb stable 8.8  3. AKI due to post-op ATN - Renal function ok.   Plateued - Creatinine improved to 1.5  4. DMII - On SSI  5. Hypokalemia - K 3.2 will supp   Pending CIR placement when bed available. D/w TCTS. We will see as needed.   Glori Bickers, MD  12:04 PM

## 2019-12-06 NOTE — Progress Notes (Signed)
Occupational Therapy Treatment Patient Details Name: William Walker MRN: [id number] DOB: 1926-11-17 Today's Date: 12/06/2019    History of present illness 86 yo admitted with chest pain with NSTEMI with severe AS and CAD s/p CABG/AVR 6/21. PMHx:HTN, DM, CAD   OT comments  Pt progressing towards established OT goals and presents with high motivation to participate in therapy. Pt performing sit<>stand with Min A +2 to power up and gain balance in standing. Pt performing functional mobility in hallway with Min Guard A and RW; requiring standing rest breaks. HR in 80s. Reviewing sternal precautions. Providing education on compensatory techniques for dressing. Pt will need further acute OT to continue education. Continue to recommend dc to CIR and will continue to follow acutely as admitted.    Follow Up Recommendations  CIR    Equipment Recommendations  Other (comment) (TBD)    Recommendations for Other Services      Precautions / Restrictions Precautions Precautions: Fall;Sternal Precaution Booklet Issued: No Precaution Comments: educated in sternal precautions       Mobility Bed Mobility Overal bed mobility: Needs Assistance             General bed mobility comments: in chair on arrival  Transfers Overall transfer level: Needs assistance Equipment used: Rolling walker (2 wheeled) Transfers: Sit to/from Stand Sit to Stand: Min assist;+2 safety/equipment         General transfer comment: Min A for power up into standing.     Balance Overall balance assessment: Needs assistance Sitting-balance support: Feet supported Sitting balance-Leahy Scale: Fair Sitting balance - Comments: EOB with guarding   Standing balance support: No upper extremity supported;During functional activity;Bilateral upper extremity supported Standing balance-Leahy Scale: Poor                             ADL either performed or assessed with clinical judgement   ADL Overall ADL's :  Needs assistance/impaired                   Upper Body Dressing Details (indicate cue type and reason): Educating pt on donning/doffing of shirts. Pt verablized understanding and will require further education and practice.    Lower Body Dressing Details (indicate cue type and reason): Educating pt on compensatory techniques for LB dressing. Discussed figure four method as well as use of reacher and other AE.  Toilet Transfer: Minimal assistance;+2 for safety/equipment;RW;Ambulation (simulated to recliner)           Functional mobility during ADLs: Min guard;Rolling walker General ADL Comments: Educating pt and daughters on compensatory techniques for ADLs.      Vision       Perception     Praxis      Cognition Arousal/Alertness: Awake/alert Behavior During Therapy: WFL for tasks assessed/performed Overall Cognitive Status: Within Functional Limits for tasks assessed                                 General Comments: Requiring increased time for [facility]        Exercises     Shoulder Instructions       General Comments Pt's two daughters present during session and very supportive.    Pertinent Vitals/ Pain       Pain Assessment: Faces Faces Pain Scale: Hurts a little bit Pain Location: belly Pain Descriptors / Indicators: Aching Pain Intervention(s): Monitored during session;Limited activity  within patient's tolerance;Repositioned  Home Living                                          Prior Functioning/Environment              Frequency  Min 2X/week        Progress Toward Goals  OT Goals(current goals can now be found in the care plan section)  Progress towards OT goals: Progressing toward goals  Acute Rehab OT Goals Patient Stated Goal: return home and fix things OT Goal Formulation: With patient Time For Goal Achievement: 12/09/19 Potential to Achieve Goals: Good ADL Goals Pt Will Perform Grooming: with min  assist;standing Pt Will Perform Upper Body Bathing: with min assist;sitting Pt Will Perform Upper Body Dressing: with min assist;sitting Pt Will Transfer to Toilet: with min assist;ambulating;bedside commode Pt Will Perform Toileting - Clothing Manipulation and hygiene: with min assist;sit to/from stand Additional ADL Goal #1: Pt will perform bed mobility with supervision in preparation for ADL. Additional ADL Goal #2: Pt will adhere to sternal precautions with minimal verbal cues.  Plan Discharge plan remains appropriate    Co-evaluation                 AM-PAC OT "6 Clicks" Daily Activity     Outcome Measure   Help from another person eating meals?: A Little Help from another person taking care of personal grooming?: A Little Help from another person toileting, which includes using toliet, bedpan, or urinal?: A Lot Help from another person bathing (including washing, rinsing, drying)?: A Lot Help from another person to put on and taking off regular upper body clothing?: A Lot Help from another person to put on and taking off regular lower body clothing?: A Lot 6 Click Score: 14    End of Session Equipment Utilized During Treatment: Rolling walker;Gait belt  OT Visit Diagnosis: Unsteadiness on feet (R26.81);Pain;Muscle weakness (generalized) (M62.81);Other symptoms and signs involving cognitive function   Activity Tolerance Patient tolerated treatment well   Patient Left with call bell/phone within reach;in chair;with family/visitor present   Nurse Communication Mobility status        Time: 8657-8469 OT Time Calculation (min): 29 min  Charges: OT General Charges $OT Visit: 1 Visit OT Treatments $Self Care/Home Management : 23-37 mins  Rea, OTR/L Acute Rehab Pager: [phone] Office: [place] 12/06/2019, 4:49 PM

## 2019-12-06 NOTE — Progress Notes (Addendum)
      [address]       ,[place] [address]             [phone]        14 Days Post-Op Procedure(s) (LRB): CORONARY ARTERY BYPASS GRAFTING (CABG), ON PUMP, TIMES FOUR , USING LEFT INTERNAL MAMMARY ARTERY AND ENDOSCOPICALLY HARVESTED RIGHT GREATER SAPHENOUS VEIN (N/A) AORTIC VALVE REPLACEMENT (AVR) USING INSPIRIS 23MM (N/A) TRANSESOPHAGEAL ECHOCARDIOGRAM (TEE) (N/A) Subjective: Resting in bed and in good spirits. He has not been up yet today and wants to get out of bed. No new concerns.  CoOx 57.8 CVP 7 MAP 51-71, on midodrine 10mg  po tid  Objective: Vital signs in last 24 hours: Temp:  [97.7 F (36.5 C)-98.3 F (36.8 C)] 97.8 F (36.6 C) (07/05 0719) Pulse Rate:  [70-74] 70 (07/05 0719) Cardiac Rhythm: Normal sinus rhythm;Heart block (07/05 0847) Resp:  [14-19] 18 (07/05 0718) BP: (102-121)/(51-71) 112/63 (07/05 0718) SpO2:  [96 %-99 %] 98 % (07/05 0719) Weight:  [96 kg] 96 kg (07/05 0500)  Hemodynamic parameters for last 24 hours: CVP:  [7 mmHg-10 mmHg] 7 mmHg  Intake/Output from previous day: 07/04 0701 - 07/05 0700 In: 240 [P.O.:240] Out: 2375 [Urine:2375] Intake/Output this shift: Total I/O In: 240 [P.O.:240] Out: -   Physical Exam General appearance:alert, cooperative and no distress Neurologic:intact Heart:regular rate and rhythm Lungs:breath sounds clear Extremities:Unna boot and TEDs to both LE's. Swelling in feet about the same Wound:Incisions continue to heal with no sign of complication.  Lab Results: Recent Labs    12/05/19 0430 12/06/19 0410  WBC 7.1 7.0  HGB 9.4* 8.8*  HCT 28.8* 27.4*  PLT 314 287   BMET:  Recent Labs    12/05/19 0430 12/06/19 0410  NA 136 134*  K 3.2* 3.2*  CL 97* 95*  CO2 29 27  GLUCOSE 105* 136*  BUN 30* 34*  CREATININE 1.46* 1.53*  CALCIUM 8.5* 8.3*    PT/INR: No results for input(s): LABPROT, INR in the last 72 hours. ABG    Component Value Date/Time   PHART 7.323 (L)  11/23/2019 0211   HCO3 18.4 (L) 11/23/2019 0211   TCO2 19 (L) 11/23/2019 0211   ACIDBASEDEF 7.0 (H) 11/23/2019 0211   O2SAT 57.8 12/06/2019 0413   CBG (last 3)  Recent Labs    12/05/19 2142 12/06/19 0649 12/06/19 1109  GLUCAP 160* 114* 226*    Assessment/Plan: S/P Procedure(s) (LRB): CORONARY ARTERY BYPASS GRAFTING (CABG), ON PUMP, TIMES FOUR , USING LEFT INTERNAL MAMMARY ARTERY AND ENDOSCOPICALLY HARVESTED RIGHT GREATER SAPHENOUS VEIN (N/A) AORTIC VALVE REPLACEMENT (AVR) USING INSPIRIS 23MM (N/A) TRANSESOPHAGEAL ECHOCARDIOGRAM (TEE) (N/A)  -POD-14CABG x 4 and AVR for severe AS with post bypass cardiogenic shock.  Remains hemodynamically stable and is slowly progressing with mobility. He says he has the most difficulty with transitions. Continue PT. Has received insurance approval for CIR but no beds are yet available on that unit.   --Acute on chronic renal insufficiency with volume excess--renal function stable near baseline. Peripheral edema resolving.  -Expected acute blood loss anemia- hctimproving,Monitor.   -Hypokalemia- supplement ordered.   -Type 2 DM- Pre-op A1C 7.4. Glucosecontroladequate,ContinueLevemir to 16 units [place] daily andSSI.  -Disposition--Plan for CIR when bed available.    LOS: 21 days    Antony Odea, [place] [phone] 12/06/2019   patient examined and medical record reviewed,agree with above note. Tharon Aquas Trigt III 12/06/2019

## 2019-12-06 NOTE — Progress Notes (Signed)
Inpatient Rehab Admissions Coordinator:   Spoke with pt's daughter over the phone.  I do have insurance authorization, however I have no beds available for this patient today.  Will continue to follow for timing of potential CIR admit (hopefully this week) pending bed availability.   Shann Medal, PT, DPT Admissions Coordinator [phone] 12/06/19  10:57 AM

## 2019-12-07 ENCOUNTER — Other Ambulatory Visit: Payer: Self-pay

## 2019-12-07 ENCOUNTER — Encounter (HOSPITAL_COMMUNITY): Payer: Self-pay | Admitting: Physical Medicine and Rehabilitation

## 2019-12-07 ENCOUNTER — Inpatient Hospital Stay (HOSPITAL_COMMUNITY)
Admission: RE | Admit: 2019-12-07 | Discharge: 2019-12-16 | DRG: 945 | Disposition: A | Discharge status: Home / Self Care | Payer: Medicare Other | Source: Intra-hospital | Attending: Physical Medicine and Rehabilitation | Admitting: Physical Medicine and Rehabilitation

## 2019-12-07 DIAGNOSIS — Z79891 Long term (current) use of opiate analgesic: Secondary | ICD-10-CM

## 2019-12-07 DIAGNOSIS — E039 Hypothyroidism, unspecified: Secondary | ICD-10-CM | POA: Diagnosis present

## 2019-12-07 DIAGNOSIS — R350 Frequency of micturition: Secondary | ICD-10-CM | POA: Diagnosis present

## 2019-12-07 DIAGNOSIS — R5381 Other malaise: Secondary | ICD-10-CM | POA: Diagnosis not present

## 2019-12-07 DIAGNOSIS — Z720 Tobacco use: Secondary | ICD-10-CM

## 2019-12-07 DIAGNOSIS — K4090 Unilateral inguinal hernia, without obstruction or gangrene, not specified as recurrent: Secondary | ICD-10-CM | POA: Diagnosis not present

## 2019-12-07 DIAGNOSIS — K5900 Constipation, unspecified: Secondary | ICD-10-CM | POA: Diagnosis not present

## 2019-12-07 DIAGNOSIS — Z8249 Family history of ischemic heart disease and other diseases of the circulatory system: Secondary | ICD-10-CM

## 2019-12-07 DIAGNOSIS — R32 Unspecified urinary incontinence: Secondary | ICD-10-CM | POA: Diagnosis present

## 2019-12-07 DIAGNOSIS — H9193 Unspecified hearing loss, bilateral: Secondary | ICD-10-CM | POA: Diagnosis not present

## 2019-12-07 DIAGNOSIS — K219 Gastro-esophageal reflux disease without esophagitis: Secondary | ICD-10-CM | POA: Diagnosis present

## 2019-12-07 DIAGNOSIS — I129 Hypertensive chronic kidney disease with stage 1 through stage 4 chronic kidney disease, or unspecified chronic kidney disease: Secondary | ICD-10-CM | POA: Diagnosis present

## 2019-12-07 DIAGNOSIS — I2510 Atherosclerotic heart disease of native coronary artery without angina pectoris: Secondary | ICD-10-CM | POA: Diagnosis present

## 2019-12-07 DIAGNOSIS — N1832 Chronic kidney disease, stage 3b: Secondary | ICD-10-CM | POA: Diagnosis present

## 2019-12-07 DIAGNOSIS — Z79899 Other long term (current) drug therapy: Secondary | ICD-10-CM

## 2019-12-07 DIAGNOSIS — Z951 Presence of aortocoronary bypass graft: Secondary | ICD-10-CM

## 2019-12-07 DIAGNOSIS — E1140 Type 2 diabetes mellitus with diabetic neuropathy, unspecified: Secondary | ICD-10-CM | POA: Diagnosis present

## 2019-12-07 DIAGNOSIS — N179 Acute kidney failure, unspecified: Secondary | ICD-10-CM

## 2019-12-07 DIAGNOSIS — I252 Old myocardial infarction: Secondary | ICD-10-CM | POA: Diagnosis not present

## 2019-12-07 DIAGNOSIS — D539 Nutritional anemia, unspecified: Secondary | ICD-10-CM | POA: Diagnosis present

## 2019-12-07 DIAGNOSIS — T8111XD Postprocedural  cardiogenic shock, subsequent encounter: Secondary | ICD-10-CM

## 2019-12-07 DIAGNOSIS — Z953 Presence of xenogenic heart valve: Secondary | ICD-10-CM | POA: Diagnosis not present

## 2019-12-07 DIAGNOSIS — R7309 Other abnormal glucose: Secondary | ICD-10-CM

## 2019-12-07 DIAGNOSIS — Z6829 Body mass index (BMI) 29.0-29.9, adult: Secondary | ICD-10-CM

## 2019-12-07 DIAGNOSIS — E669 Obesity, unspecified: Secondary | ICD-10-CM | POA: Diagnosis present

## 2019-12-07 DIAGNOSIS — E785 Hyperlipidemia, unspecified: Secondary | ICD-10-CM | POA: Diagnosis present

## 2019-12-07 DIAGNOSIS — E876 Hypokalemia: Secondary | ICD-10-CM | POA: Diagnosis not present

## 2019-12-07 DIAGNOSIS — E1122 Type 2 diabetes mellitus with diabetic chronic kidney disease: Secondary | ICD-10-CM | POA: Diagnosis present

## 2019-12-07 DIAGNOSIS — Z79890 Hormone replacement therapy (postmenopausal): Secondary | ICD-10-CM

## 2019-12-07 DIAGNOSIS — E1165 Type 2 diabetes mellitus with hyperglycemia: Secondary | ICD-10-CM

## 2019-12-07 DIAGNOSIS — D62 Acute posthemorrhagic anemia: Secondary | ICD-10-CM | POA: Diagnosis not present

## 2019-12-07 DIAGNOSIS — Z7982 Long term (current) use of aspirin: Secondary | ICD-10-CM

## 2019-12-07 DIAGNOSIS — Z7984 Long term (current) use of oral hypoglycemic drugs: Secondary | ICD-10-CM

## 2019-12-07 DIAGNOSIS — Z955 Presence of coronary angioplasty implant and graft: Secondary | ICD-10-CM

## 2019-12-07 LAB — BASIC METABOLIC PANEL
Anion gap: 12 (ref 5–15)
BUN: 36 mg/dL — ABNORMAL HIGH (ref 8–23)
CO2: 27 mmol/L (ref 22–32)
Calcium: 8.4 mg/dL — ABNORMAL LOW (ref 8.9–10.3)
Chloride: 98 mmol/L (ref 98–111)
Creatinine, Ser: 1.46 mg/dL — ABNORMAL HIGH (ref 0.61–1.24)
GFR calc Af Amer: 48 mL/min — ABNORMAL LOW (ref >60)
GFR calc non Af Amer: 41 mL/min — ABNORMAL LOW (ref >60)
Glucose, Bld: 99 mg/dL (ref 70–99)
Potassium: 3.7 mmol/L (ref 3.5–5.1)
Sodium: 137 mmol/L (ref 135–145)

## 2019-12-07 LAB — CBC
HCT: 27.6 % — ABNORMAL LOW (ref 39.0–52.0)
Hemoglobin: 8.8 g/dL — ABNORMAL LOW (ref 13.0–17.0)
MCH: 32 pg (ref 26.0–34.0)
MCHC: 31.9 g/dL (ref 30.0–36.0)
MCV: 100.4 fL — ABNORMAL HIGH (ref 80.0–100.0)
Platelets: 271 10*3/uL (ref 150–400)
RBC: 2.75 MIL/uL — ABNORMAL LOW (ref 4.22–5.81)
RDW: 18.3 % — ABNORMAL HIGH (ref 11.5–15.5)
WBC: 7.7 10*3/uL (ref 4.0–10.5)
nRBC: 0 % (ref 0.0–0.2)

## 2019-12-07 LAB — COOXEMETRY PANEL
Carboxyhemoglobin: 1.5 % (ref 0.5–1.5)
Methemoglobin: 0.7 % (ref 0.0–1.5)
O2 Saturation: 61.3 %
Total hemoglobin: 9.2 g/dL — ABNORMAL LOW (ref 12.0–16.0)

## 2019-12-07 LAB — GLUCOSE, CAPILLARY
Glucose-Capillary: 107 mg/dL — ABNORMAL HIGH (ref 70–99)
Glucose-Capillary: 315 mg/dL — ABNORMAL HIGH (ref 70–99)
Glucose-Capillary: 40 mg/dL — CL (ref 70–99)
Glucose-Capillary: 47 mg/dL — ABNORMAL LOW (ref 70–99)
Glucose-Capillary: 93 mg/dL (ref 70–99)
Glucose-Capillary: 94 mg/dL (ref 70–99)

## 2019-12-07 MED ORDER — TORSEMIDE 20 MG PO TABS
20.0000 mg | ORAL_TABLET | Freq: Every day | ORAL | Status: DC
  Administered 2019-12-08 – 2019-12-15 (×8): 20 mg via ORAL
  Filled 2019-12-07 (×8): qty 1

## 2019-12-07 MED ORDER — INSULIN ASPART 100 UNIT/ML ~~LOC~~ SOLN
0.0000 [IU] | Freq: Three times a day (TID) | SUBCUTANEOUS | Status: DC
  Administered 2019-12-08: 2 [IU] via SUBCUTANEOUS
  Administered 2019-12-08: 12 [IU] via SUBCUTANEOUS
  Administered 2019-12-09: 4 [IU] via SUBCUTANEOUS

## 2019-12-07 MED ORDER — INSULIN DETEMIR 100 UNIT/ML ~~LOC~~ SOLN
15.0000 [IU] | Freq: Every day | SUBCUTANEOUS | Status: DC
  Administered 2019-12-08 – 2019-12-13 (×6): 15 [IU] via SUBCUTANEOUS
  Filled 2019-12-07 (×7): qty 0.15

## 2019-12-07 MED ORDER — LIVING WELL WITH DIABETES BOOK
Freq: Once | Status: AC
  Filled 2019-12-07: qty 1

## 2019-12-07 MED ORDER — BISACODYL 5 MG PO TBEC
10.0000 mg | DELAYED_RELEASE_TABLET | Freq: Every day | ORAL | Status: DC
  Administered 2019-12-08 – 2019-12-16 (×9): 10 mg via ORAL
  Filled 2019-12-07 (×9): qty 2

## 2019-12-07 MED ORDER — BOOST / RESOURCE BREEZE PO LIQD CUSTOM
1.0000 | Freq: Three times a day (TID) | ORAL | Status: DC
  Administered 2019-12-07 – 2019-12-09 (×4): 1 via ORAL

## 2019-12-07 MED ORDER — POTASSIUM CHLORIDE CRYS ER 20 MEQ PO TBCR: 40.0000 meq | EXTENDED_RELEASE_TABLET | Freq: Every day | ORAL | Status: DC

## 2019-12-07 MED ORDER — MIDODRINE HCL 5 MG PO TABS
10.0000 mg | ORAL_TABLET | Freq: Three times a day (TID) | ORAL | Status: DC
  Administered 2019-12-08 – 2019-12-16 (×25): 10 mg via ORAL
  Filled 2019-12-07 (×24): qty 2

## 2019-12-07 MED ORDER — ASPIRIN EC 325 MG PO TBEC
325.0000 mg | DELAYED_RELEASE_TABLET | Freq: Every day | ORAL | Status: DC
  Administered 2019-12-10 – 2019-12-15 (×4): 325 mg via ORAL
  Filled 2019-12-07 (×9): qty 1

## 2019-12-07 MED ORDER — TORSEMIDE 20 MG PO TABS: 40.0000 mg | ORAL_TABLET | Freq: Every day | ORAL | Status: DC

## 2019-12-07 MED ORDER — ASPIRIN 81 MG PO CHEW
324.0000 mg | CHEWABLE_TABLET | Freq: Every day | ORAL | Status: DC
  Administered 2019-12-08 – 2019-12-16 (×5): 324 mg
  Filled 2019-12-07 (×7): qty 4

## 2019-12-07 MED ORDER — DOCUSATE SODIUM 100 MG PO CAPS
200.0000 mg | ORAL_CAPSULE | Freq: Every day | ORAL | Status: DC
  Administered 2019-12-08 – 2019-12-16 (×9): 200 mg via ORAL
  Filled 2019-12-07 (×9): qty 2

## 2019-12-07 MED ORDER — INSULIN DETEMIR 100 UNIT/ML ~~LOC~~ SOLN
16.0000 [IU] | Freq: Every day | SUBCUTANEOUS | Status: DC
  Filled 2019-12-07: qty 0.16

## 2019-12-07 MED ORDER — ENOXAPARIN SODIUM 30 MG/0.3ML ~~LOC~~ SOLN: 30.0000 mg | SUBCUTANEOUS | Status: DC

## 2019-12-07 MED ORDER — ROSUVASTATIN CALCIUM 10 MG PO TABS: 10.0000 mg | ORAL_TABLET | Freq: Every day | ORAL | Status: DC

## 2019-12-07 MED ORDER — BISACODYL 10 MG RE SUPP
10.0000 mg | Freq: Every day | RECTAL | Status: DC
  Filled 2019-12-07 (×3): qty 1

## 2019-12-07 MED ORDER — SORBITOL 70 % SOLN: 30.0000 mL | Freq: Every day | Status: DC | PRN

## 2019-12-07 MED ORDER — ADULT MULTIVITAMIN W/MINERALS CH: 1.0000 | ORAL_TABLET | Freq: Every day | ORAL | Status: AC

## 2019-12-07 MED ORDER — PANTOPRAZOLE SODIUM 40 MG PO TBEC: 40.0000 mg | DELAYED_RELEASE_TABLET | Freq: Every day | ORAL | Status: DC

## 2019-12-07 MED ORDER — ASPIRIN 325 MG PO TBEC: 325.0000 mg | DELAYED_RELEASE_TABLET | Freq: Every day | ORAL | 0 refills | Status: DC

## 2019-12-07 MED ORDER — TRAMADOL HCL 50 MG PO TABS: 50.0000 mg | ORAL_TABLET | Freq: Four times a day (QID) | ORAL | Status: DC | PRN

## 2019-12-07 MED ORDER — PANTOPRAZOLE SODIUM 40 MG PO TBEC
40.0000 mg | DELAYED_RELEASE_TABLET | Freq: Every day | ORAL | Status: DC
  Administered 2019-12-08 – 2019-12-16 (×9): 40 mg via ORAL
  Filled 2019-12-07 (×9): qty 1

## 2019-12-07 MED ORDER — TORSEMIDE 20 MG PO TABS
40.0000 mg | ORAL_TABLET | Freq: Every day | ORAL | Status: DC
  Administered 2019-12-08 – 2019-12-16 (×10): 40 mg via ORAL
  Filled 2019-12-07 (×9): qty 2

## 2019-12-07 MED ORDER — TRAMADOL HCL 50 MG PO TABS
50.0000 mg | ORAL_TABLET | Freq: Four times a day (QID) | ORAL | Status: DC | PRN
  Administered 2019-12-11 – 2019-12-12 (×2): 50 mg via ORAL
  Filled 2019-12-07 (×4): qty 1

## 2019-12-07 MED ORDER — ROSUVASTATIN CALCIUM 5 MG PO TABS
10.0000 mg | ORAL_TABLET | Freq: Every day | ORAL | Status: DC
  Administered 2019-12-08 – 2019-12-16 (×9): 10 mg via ORAL
  Filled 2019-12-07 (×9): qty 2

## 2019-12-07 MED ORDER — ADULT MULTIVITAMIN W/MINERALS CH
1.0000 | ORAL_TABLET | Freq: Every day | ORAL | Status: DC
  Administered 2019-12-08 – 2019-12-16 (×9): 1 via ORAL
  Filled 2019-12-07 (×9): qty 1

## 2019-12-07 MED ORDER — LEVOTHYROXINE SODIUM 50 MCG PO TABS
50.0000 ug | ORAL_TABLET | Freq: Every day | ORAL | Status: DC
  Administered 2019-12-08 – 2019-12-16 (×9): 50 ug via ORAL
  Filled 2019-12-07 (×8): qty 1

## 2019-12-07 MED ORDER — OXYCODONE HCL 5 MG PO TABS: 5.0000 mg | ORAL_TABLET | ORAL | Status: DC | PRN

## 2019-12-07 MED ORDER — POTASSIUM CHLORIDE CRYS ER 20 MEQ PO TBCR
40.0000 meq | EXTENDED_RELEASE_TABLET | Freq: Every day | ORAL | Status: DC
  Administered 2019-12-08: 40 meq via ORAL
  Filled 2019-12-07: qty 2

## 2019-12-07 MED ORDER — TORSEMIDE 20 MG PO TABS: 20.0000 mg | ORAL_TABLET | Freq: Every day | ORAL | Status: DC

## 2019-12-07 MED ORDER — MIDODRINE HCL 10 MG PO TABS: 10.0000 mg | ORAL_TABLET | Freq: Three times a day (TID) | ORAL | Status: DC

## 2019-12-07 MED ORDER — ENOXAPARIN SODIUM 30 MG/0.3ML ~~LOC~~ SOLN
30.0000 mg | SUBCUTANEOUS | Status: DC
  Administered 2019-12-07 – 2019-12-15 (×9): 30 mg via SUBCUTANEOUS
  Filled 2019-12-07 (×9): qty 0.3

## 2019-12-07 MED ORDER — BLOOD PRESSURE CONTROL BOOK
Freq: Once | Status: AC
  Filled 2019-12-07: qty 1

## 2019-12-07 NOTE — PMR Pre-admission (Signed)
PMR Admission Coordinator Pre-Admission Assessment  Patient: William Walker is an 86 y.o., male MRN: [id number] DOB: 12/31/1926 Height: '5\' 11"'$  (180.3 cm) Weight: 95.2 kg  Insurance Information HMO: yes    PPO:      PCP:      IPA:      80/20:      OTHER:  PRIMARY: [employer] Medicare      Policy#: [id number]      Subscriber: pt CM Name:       Phone#: [phone]     Fax#: [phone] Pre-Cert#: [id number] auth for CIR provided by Janett Billow with Bernadene Bell with updates due to fax listed above on 7/12.       Employer: n/a Benefits:  Phone #: [phone]     Name:  Eff. Date: 06/04/19     Deduct: $0      Out of Pocket Max: $3600 (164.27 met)      Life Max: n/a CIR: $295/day for days 1-5      SNF: 20 full days Outpatient:      Co-Pay: $30/visit Home Health: 100%      Co-Pay:  DME: 80%     Co-Pay: 20% Providers:   SECONDARY:       Policy#:      Phone#:   Development worker, community:       Phone#:   The Actuary for patients in Inpatient Rehabilitation Facilities with attached Privacy Act [place] Records was provided and verbally reviewed with: Patient and Family  Emergency Contact Information Contact Information    Name Relation Home Work Mobile   Orgeron,William Walker [phone]     Walker, William   [phone]   William Walker Daughter [phone]  [phone]   William Walker Daughter   [phone]      Current Medical History  Patient Admitting Diagnosis: CABG x4, AVR  History of Present Illness: William Walker is a 86 year old right-handed male with history of severe calcified aortic stenosis, chronic exertional angina, multivessel CAD with moderate diffuse disease and patent LAD stent since his last cardiac catheterization 2019 as well as history of hypertension, hyperlipidemia, obesity with BMI 29.28 and type 2 diabetes mellitus.  Presented 11/15/2019 with exertional chest pain.  Patient has been followed by Dr. Domenic Polite of cardiology services as  an outpatient.  Chest x-ray with no active disease.  EKG with normal sinus rhythm with first-degree AV block with ST segment depressions in lateral and inferior leads that showed change from prior tracings 09/13/2019.  Troponin 4003.  He was loaded with Plavix.  Underwent cardiac catheterization showed 80% left main obstruction, proximal LAD obstruction of 80 to 90%, 70% obstruction of the first obtuse marginal and 90% obstruction of the intermediate and 75% mid right obstruction.  Patient underwent aortic valve replacement with CABG x4 11/22/2019 per Dr. Servando Snare.  Postoperative pain management with sternal precautions.  He was slowly extubated.  Post cardiotomy shock he was on milrinone for a short time.  Follow-up echocardiogram 11/30/2019 showed ejection fraction of 26% grade 2 diastolic dysfunction.  Patient currently maintained on full-strength aspirin 325 mg daily.  Subcutaneous Lovenox for DVT prophylaxis.  He is tolerating mechanical soft diet.  Acute blood loss anemia 8.8 and monitored.  He did have some AKI 1.67-1.46 and monitored.  Therapy evaluations completed and patient was recommended for a comprehensive rehab program    Patient's medical record from [facility] has been reviewed by the rehabilitation admission coordinator and physician.  Past Medical History  Past Medical History:  Diagnosis Date   Aortic stenosis    Coronary atherosclerosis of native coronary artery    Reportedly 2 stents - presumably LAD (records not available)   CVD (cerebrovascular disease)    Essential hypertension    GERD (gastroesophageal reflux disease)    Hyperlipidemia    MI (myocardial infarction) [facility])    October 1999   Type 2 diabetes mellitus with diabetic neuropathy ([place])     Family History   family history includes Coronary artery disease in his father.  Prior Rehab/Hospitalizations Has the patient had prior rehab or hospitalizations prior to admission? No  Has the patient had  major surgery during 100 days prior to admission? Yes   Current Medications  Current Facility-Administered Medications:    0.9 %  sodium chloride infusion, 250 mL, Intravenous, Continuous, Roddenberry, Myron G, PA-C   0.9 %  sodium chloride infusion, , Intravenous, Continuous, Roddenberry, Myron G, PA-C, Stopped at 11/24/19 1458   aspirin EC tablet 325 mg, 325 mg, Oral, Daily, 325 mg at 12/07/19 1443 **OR** aspirin chewable tablet 324 mg, 324 mg, Per Tube, Daily, Roddenberry, Myron G, PA-C   bisacodyl (DULCOLAX) EC tablet 10 mg, 10 mg, Oral, Daily, 10 mg at 12/07/19 1540 **OR** bisacodyl (DULCOLAX) suppository 10 mg, 10 mg, Rectal, Daily, Roddenberry, Myron G, PA-C   Chlorhexidine Gluconate Cloth 2 % PADS 6 each, 6 each, Topical, Daily, [name], MD, 6 each at 12/07/19 0826   dextrose 50 % solution 0-50 mL, 0-50 mL, Intravenous, PRN, Roddenberry, Myron G, PA-C   docusate sodium (COLACE) capsule 200 mg, 200 mg, Oral, Daily, Roddenberry, Myron G, PA-C, 200 mg at 12/07/19 0823   enoxaparin (LOVENOX) injection 30 mg, 30 mg, Subcutaneous, Q24H, Lanelle Bal B, MD, 30 mg at 12/06/19 2120   feeding supplement (BOOST / RESOURCE BREEZE) liquid 1 Container, 1 Container, Oral, TID BM, [name], MD, 1 Container at 12/07/19 0826   insulin aspart (novoLOG) injection 0-24 Units, 0-24 Units, Subcutaneous, TID AC & HS, [name], MD, 2 Units at 12/06/19 2119   insulin detemir (LEVEMIR) injection 16 Units, 16 Units, Subcutaneous, Daily, Roddenberry, Myron G, PA-C, 16 Units at 12/07/19 0867   lactated ringers infusion, , Intravenous, Continuous, Roddenberry, Myron G, PA-C   levothyroxine (SYNTHROID) tablet 50 mcg, 50 mcg, Oral, QAC breakfast, Roddenberry, Myron G, PA-C, 50 mcg at 12/07/19 6195   MEDLINE mouth rinse, 15 mL, Mouth Rinse, BID, [name], MD, 15 mL at 12/07/19 0827   metoprolol tartrate (LOPRESSOR) injection 2.5-5 mg, 2.5-5 mg, Intravenous, Q2H  PRN, Roddenberry, Myron G, PA-C   midodrine (PROAMATINE) tablet 10 mg, 10 mg, Oral, TID WC, Bensimhon, Shaune Pascal, MD, 10 mg at 12/07/19 0932   morphine 2 MG/ML injection 1 mg, 1 mg, Intravenous, Q1H PRN, [name], MD   multivitamin with minerals tablet 1 tablet, 1 tablet, Oral, Daily, [name], MD, 1 tablet at 12/07/19 0823   ondansetron (ZOFRAN) injection 4 mg, 4 mg, Intravenous, Q6H PRN, Roddenberry, Myron G, PA-C, 4 mg at 11/26/19 2329   oxyCODONE (Oxy IR/ROXICODONE) immediate release tablet 5 mg, 5 mg, Oral, Q4H PRN, [name], MD, 5 mg at 11/24/19 2300   pantoprazole (PROTONIX) EC tablet 40 mg, 40 mg, Oral, Daily, Roddenberry, Myron G, PA-C, 40 mg at 12/07/19 0823   potassium chloride SA (KLOR-CON) CR tablet 40 mEq, 40 mEq, Oral, Daily, Bensimhon, Shaune Pascal, MD, 40 mEq at 12/07/19 6712   rosuvastatin (CRESTOR) tablet 10 mg, 10 mg,  Oral, Daily, Bensimhon, Shaune Pascal, MD, 10 mg at 12/07/19 6720   sodium chloride flush (NS) 0.9 % injection 10-40 mL, 10-40 mL, Intracatheter, Q12H, [name], MD, 10 mL at 12/07/19 0827   sodium chloride flush (NS) 0.9 % injection 10-40 mL, 10-40 mL, Intracatheter, PRN, [name], MD   sodium chloride flush (NS) 0.9 % injection 3 mL, 3 mL, Intravenous, Q12H, Roddenberry, Myron G, PA-C, 3 mL at 12/06/19 2120   sodium chloride flush (NS) 0.9 % injection 3 mL, 3 mL, Intravenous, PRN, Roddenberry, Myron G, PA-C   torsemide (DEMADEX) tablet 20 mg, 20 mg, Oral, Daily, Bensimhon, Shaune Pascal, MD, 20 mg at 12/06/19 1716   torsemide (DEMADEX) tablet 40 mg, 40 mg, Oral, Daily, Bensimhon, Shaune Pascal, MD, 40 mg at 12/07/19 9470   traMADol (ULTRAM) tablet 50 mg, 50 mg, Oral, Q6H PRN, [name], MD, 50 mg at 12/05/19 0020  Patients Current Diet:  Diet Order            DIET DYS 3 Room service appropriate? Yes; Fluid consistency: Thin  Diet effective now                 Precautions /  Restrictions Precautions Precautions: Fall, Sternal Precaution Booklet Issued: No Precaution Comments: educated in sternal precautions Restrictions Weight Bearing Restrictions: No   Has the patient had 2 or more falls or a fall with injury in the past year? No  Prior Activity Level Community (5-7x/wk): some difficulty with his transitions, but mod I, and able to care for the home prior to admission; daughter does state that pt had a shuffling gait at baseline  Prior Functional Level Self Care: Did the patient need help bathing, dressing, using the toilet or eating? Independent  Indoor Mobility: Did the patient need assistance with walking from room to room (with or without device)? Independent  Stairs: Did the patient need assistance with internal or external stairs (with or without device)? Independent  Functional Cognition: Did the patient need help planning regular tasks such as shopping or remembering to take medications? Independent  Home Assistive Devices / [place] Devices/Equipment: None Home Equipment: Walker - 2 wheels, Bedside commode  Prior Device Use: Indicate devices/aids used by the patient prior to current illness, exacerbation or injury? none  Current Functional Level Cognition  Overall Cognitive Status: Within Functional Limits for tasks assessed Difficult to assess due to: Hard of hearing/deaf Orientation Level: Oriented X4 General Comments: Requiring increased time for [facility]    Extremity Assessment (includes Sensation/Coordination)  Upper Extremity Assessment: Generalized weakness  Lower Extremity Assessment: Defer to PT evaluation    ADLs  Overall ADL's : Needs assistance/impaired Eating/Feeding: Set up, Sitting Grooming: Set up, Sitting Upper Body Bathing: Moderate assistance, Sitting Lower Body Bathing: Total assistance, +2 for physical assistance, Sit to/from stand Upper Body Dressing : Moderate assistance, Sitting Upper Body  Dressing Details (indicate cue type and reason): Educating pt on donning/doffing of shirts. Pt verablized understanding and will require further education and practice.  Lower Body Dressing: +2 for physical assistance, Total assistance, Sit to/from stand Lower Body Dressing Details (indicate cue type and reason): Educating pt on compensatory techniques for LB dressing. Discussed figure four method as well as use of reacher and other AE.  Toilet Transfer: Minimal assistance, +2 for safety/equipment, RW, Ambulation (simulated to recliner) Toileting- Clothing Manipulation and Hygiene: +2 for physical assistance, Total assistance, Sit to/from stand Functional mobility during ADLs: Min guard, Rolling walker General ADL Comments:  Educating pt and daughters on compensatory techniques for ADLs.     Mobility  Overal bed mobility: Needs Assistance Bed Mobility: Rolling, Sit to Sidelying Rolling: Min assist Sidelying to sit: Max assist, HOB elevated Supine to sit: Mod assist Sit to supine: Mod assist Sit to sidelying: Mod assist General bed mobility comments: in chair on arrival    Transfers  Overall transfer level: Needs assistance Equipment used: Rolling walker (2 wheeled) Transfers: Sit to/from Stand Sit to Stand: Min assist, +2 safety/equipment Stand pivot transfers: +2 physical assistance, Min assist General transfer comment: Min A for power up into standing.     Ambulation / Gait / Stairs / Wheelchair Mobility  Ambulation/Gait Ambulation/Gait assistance: Min assist, +2 safety/equipment Gait Distance (Feet): 180 Feet Assistive device: Rolling walker (2 wheeled) Gait Pattern/deviations: Trunk flexed, Shuffle, Decreased stride length General Gait Details: cues for looking up and stepping into RW. Pt walked 120' then had a seated rest and walked additional 180' with slow cautious gait with RW Gait velocity interpretation: 1.31 - 2.62 ft/sec, indicative of limited community ambulator     Posture / Balance Dynamic Sitting Balance Sitting balance - Comments: EOB with guarding Balance Overall balance assessment: Needs assistance Sitting-balance support: Feet supported Sitting balance-Leahy Scale: Fair Sitting balance - Comments: EOB with guarding Standing balance support: No upper extremity supported, During functional activity, Bilateral upper extremity supported Standing balance-Leahy Scale: Poor Standing balance comment: bil UE support on RW in standing    Special needs/care consideration Skin sternal incision, Diabetic management yes and Designated visitor [place] (from acute therapy documentation) Living Arrangements: Walker/significant other Available Help at Discharge: Family, Available 24 hours/day Type of Home: House Home Layout: Two level, Able to live on main level with bedroom/bathroom Home Access: Stairs to enter Entrance Stairs-Rails: Right, Left Entrance Stairs-Number of Steps: 6 Bathroom Shower/Tub: Chiropodist: Handicapped [place]: No  Discharge Living Setting Plans for Discharge Living Setting: Patient's home Type of Home at Discharge: House Discharge Home Layout: Two level, Able to live on main level with bedroom/bathroom Discharge Home Access: Stairs to enter Entrance Stairs-Rails: Right, Left Entrance Stairs-Number of Steps: 7 Discharge Bathroom Shower/Tub: Tub/shower unit Discharge Bathroom Toilet: Handicapped height Discharge Bathroom Accessibility: Yes How Accessible: Accessible via walker Does the patient have any problems obtaining your medications?: No  Social/Family/Support Systems Patient Roles: Walker (William is in her 38s) Anticipated Caregiver: William Walker is main contact.  Pt has his wife at home, 2 daughters, and a son Anticipated Caregiver's Contact Information: William Walker [phone] Ability/Limitations of Caregiver: n/a Caregiver Availability: 24/7 Discharge  Plan Discussed with Primary Caregiver: Yes Is Caregiver In Agreement with Plan?: Yes Does Caregiver/Family have Issues with Lodging/Transportation while Pt is in Rehab?: No  Goals Patient/Family Goal for Rehab: PT/OT supervision to mod I Expected length of stay: 7-10 days Additional Information: pt very HOH Pt/Family Agrees to Admission and willing to participate: Yes Program Orientation Provided & Reviewed with Pt/Caregiver Including Roles  & Responsibilities: Yes  Barriers to Discharge: Insurance for SNF coverage  Decrease burden of Care through IP rehab admission: n/a  Possible need for SNF placement upon discharge: Not anticipated  Patient Condition: I have reviewed medical records from [facility], spoken with CM, and patient and daughter. I met with patient at the bedside for inpatient rehabilitation assessment.  Patient will benefit from ongoing PT and OT, can actively participate in 3 hours of therapy a day 5 days of the week, and can  make measurable gains during the admission.  Patient will also benefit from the coordinated team approach during an Inpatient Acute Rehabilitation admission.  The patient will receive intensive therapy as well as Rehabilitation physician, nursing, social worker, and care management interventions.  Due to safety, skin/wound care, disease management, medication administration, pain management and patient education the patient requires 24 hour a day rehabilitation nursing.  The patient is currently min assist with mobility and basic ADLs.  Discharge setting and therapy post discharge at home with home health is anticipated.  Patient has agreed to participate in the Acute Inpatient Rehabilitation Program and will admit today.  Preadmission Screen Completed By:  Michel Santee, PT, DPT 12/07/2019 10:33 AM ______________________________________________________________________   Discussed status with Dr. Ranell Patrick on 12/07/19  at 10:38 AM  and received  approval for admission today.  Admission Coordinator:  Michel Santee, PT, DPT time 10:38 AM Sudie Grumbling 12/07/19    Assessment/Plan: Diagnosis: Debility following CABGx4 and aortic valve replacement 1. Does the need for close, 24 hr/day Medical supervision in concert with the patient's rehab needs make it unreasonable for this patient to be served in a less intensive setting? Yes 2. Co-Morbidities requiring supervision/potential complications: HLD, severe aortic stenosis, right inguinal hernia, essential HTN, bilateral carotid artery disease 3. Due to bladder management, bowel management, safety, skin/wound care, disease management, medication administration, pain management and patient education, does the patient require 24 hr/day rehab nursing? Yes 4. Does the patient require coordinated care of a physician, rehab nurse, PT and OT to address physical and functional deficits in the context of the above medical diagnosis(es)? Yes Addressing deficits in the following areas: balance, endurance, locomotion, strength, transferring, bowel/bladder control, bathing, dressing, feeding, grooming, toileting and psychosocial support 5. Can the patient actively participate in an intensive therapy program of at least 3 hrs of therapy 5 days a week? Yes 6. The potential for patient to make measurable gains while on inpatient rehab is excellent 7. Anticipated functional outcomes upon discharge from inpatient rehab: modified independent PT, modified independent OT, independent SLP 8. Estimated rehab length of stay to reach the above functional goals is: 5-7 days 9. Anticipated discharge destination: Home 10. Overall Rehab/Functional Prognosis: excellent   MD Signature: Leeroy Cha, MD

## 2019-12-07 NOTE — Progress Notes (Signed)
Inpatient Rehab Admissions Coordinator:   I have a bed available for pt to admit to CIR today. Jadene Pierini, PA-C in agreement.  Will let pt/family and TOC team know.    Shann Medal, PT, DPT Admissions Coordinator [phone] 12/07/19  10:19 AM

## 2019-12-07 NOTE — Progress Notes (Signed)
Inpatient Rehabilitation Medication Review by a Pharmacist  A complete drug regimen review was completed for this patient to identify any potential clinically significant medication issues.  Clinically significant medication issues were identified:  no   Pharmacist comments: No issues identified.    Time spent performing this drug regimen review (minutes):  10   Lenis Nettleton, Rocky Crafts 12/07/2019 6:51 PM

## 2019-12-07 NOTE — Progress Notes (Signed)
CARDIAC REHAB PHASE I   PRE:  Rate/Rhythm: 89 SR  BP:  Supine:   Sitting: 112/64  Standing:    SaO2: 94%RA  MODE:  Ambulation: 120 ft  x2   POST:  Rate/Rhythm: 96 SR  BP:  Supine:   Sitting: 129/62  Standing:    SaO2: 94%RA 0940-1030 Pt walked 120 ft on RA with gait belt use, rollator and asst x 2. He sat on rollator and then he walked another 120 ft back to room.  To recliner after walk. Tolerated well. Call bell in reach. Tentatively for CIR today.  Graylon Good, RN BSN  12/07/2019 10:27 AM

## 2019-12-07 NOTE — H&P (Signed)
Physical Medicine and Rehabilitation Admission H&P  CC: Debility s/p CABG    HPI: William Walker is a 86 year old right-handed male with history of severe calcified aortic stenosis, chronic exertional angina, multivessel CAD with moderate diffuse disease and patent LAD stent since his last cardiac catheterization 2019 as well as history of hypertension, hyperlipidemia, obesity with BMI 29.28 and type 2 diabetes mellitus.  Per chart review patient lives with spouse.  Two-level home bed and bath on main level 6 steps to entry.  Independent with assistive device prior to admission.  He has a son who lives next door.  Presented 11/15/2019 with exertional chest pain.  Patient has been followed by Dr. Domenic Polite of cardiology services as an outpatient.  Chest x-ray with no active disease.  EKG with normal sinus rhythm with first-degree AV block with ST segment depressions in lateral and inferior leads that showed change from prior tracings 09/13/2019.  Troponin 4003.  He was loaded with Plavix.  Underwent cardiac catheterization showed 80% left main obstruction, proximal LAD obstruction of 80 to 90%, 70% obstruction of the first obtuse marginal and 90% obstruction of the intermediate and 75% mid right obstruction.  Patient underwent aortic valve replacement with CABG x4 11/22/2019 per Dr. Servando Snare.  Postoperative pain management with sternal precautions.  He was slowly extubated.  Post cardiotomy shock he was on milrinone for a short time.  Follow-up echocardiogram 11/30/2019 showed ejection fraction of 34% grade 2 diastolic dysfunction.  Patient currently maintained on full-strength aspirin 325 mg daily.  Subcutaneous Lovenox for DVT prophylaxis.  He is tolerating mechanical soft diet.  Acute blood loss anemia 8.8 and monitored.  He did have some AKI 1.67-1.46 and monitored.  Therapy evaluations completed and patient was admitted for a comprehensive rehab program  Review of Systems  Constitutional: Negative for  chills and fever.  HENT: Negative for hearing loss.   Eyes: Negative for blurred vision and double vision.  Respiratory: Positive for shortness of breath.   Cardiovascular: Positive for chest pain and palpitations.  Gastrointestinal: Positive for constipation. Negative for heartburn, nausea and vomiting.       GERD  Genitourinary: Positive for urgency. Negative for dysuria, flank pain and hematuria.  Musculoskeletal: Positive for joint pain.  Skin: Negative for rash.  All other systems reviewed and are negative.  Past Medical History:  Diagnosis Date  . Aortic stenosis   . Coronary atherosclerosis of native coronary artery    Reportedly 2 stents - presumably LAD (records not available)  . CVD (cerebrovascular disease)   . Essential hypertension   . GERD (gastroesophageal reflux disease)   . Hyperlipidemia   . MI (myocardial infarction) [facility])    October 1999  . Type 2 diabetes mellitus with diabetic neuropathy [facility])    Past Surgical History:  Procedure Laterality Date  . AORTIC VALVE REPLACEMENT N/A 11/22/2019   Procedure: AORTIC VALVE REPLACEMENT (AVR) USING INSPIRIS 23MM;  Surgeon: [name], MD;  Location: [place];  Service: Open Heart Surgery;  Laterality: N/A;  . Arch cerebral ateriogram  12/11/2005   Rosetta Posner MD  . CATARACT EXTRACTION, BILATERAL    . CORONARY ARTERY BYPASS GRAFT N/A 11/22/2019   Procedure: CORONARY ARTERY BYPASS GRAFTING (CABG), ON PUMP, TIMES FOUR , USING LEFT INTERNAL MAMMARY ARTERY AND ENDOSCOPICALLY HARVESTED RIGHT GREATER SAPHENOUS VEIN;  Surgeon: [name], MD;  Location: [place];  Service: Open Heart Surgery;  Laterality: N/A;  SEQUENTIAL RAMUS INTERMEDIATE TO OM1 SAPHENOUS VEIN TO DISTAL PDA  LIMA TO LAD  . INGUINAL HERNIA REPAIR Left 12/09/2012   Procedure: HERNIA REPAIR INGUINAL with mesh;  Surgeon: [name], MD;  Location: [facility];  Service: General;  Laterality: Left;  . INSERTION OF MESH Left 12/09/2012   Procedure: INSERTION  OF MESH;  Surgeon: [name], MD;  Location: [facility];  Service: General;  Laterality: Left;  . RIGHT/LEFT HEART CATH AND CORONARY ANGIOGRAPHY N/A 10/07/2017   Procedure: RIGHT/LEFT HEART CATH AND CORONARY ANGIOGRAPHY;  Surgeon: [name], MD;  Location: [place] [facility];  Service: Cardiovascular;  Laterality: N/A;  . RIGHT/LEFT HEART CATH AND CORONARY ANGIOGRAPHY N/A 11/16/2019   Procedure: RIGHT/LEFT HEART CATH AND CORONARY ANGIOGRAPHY;  Surgeon: Lorretta Harp, MD;  Location: [place] [facility];  Service: Cardiovascular;  Laterality: N/A;  . TEE WITHOUT CARDIOVERSION N/A 11/22/2019   Procedure: TRANSESOPHAGEAL ECHOCARDIOGRAM (TEE);  Surgeon: [name], MD;  Location: [place];  Service: Open Heart Surgery;  Laterality: N/A;   Family History  Problem Relation Age of Onset  . Coronary artery disease Father    Social History:  reports that he quit smoking about 71 years ago. His smoking use included pipe and cigars. His smokeless tobacco use includes chew. He reports that he does not drink alcohol and does not use drugs. Allergies: No Active Allergies Medications Prior to Admission  Medication Sig Dispense Refill  . [START ON 12/08/2019] aspirin EC 325 MG EC tablet Take 1 tablet (325 mg total) by mouth daily. 30 tablet 0  . enoxaparin (LOVENOX) 30 MG/0.3ML injection Inject 0.3 mLs (30 mg total) into the skin daily. 0 mL   . glipiZIDE (GLUCOTROL) 5 MG tablet Take 5 mg by mouth daily before breakfast.     . levothyroxine (SYNTHROID) 50 MCG tablet Take 50 mcg by mouth daily before breakfast.     . midodrine (PROAMATINE) 10 MG tablet Take 1 tablet (10 mg total) by mouth 3 (three) times daily with meals.    Derrill Memo ON 12/08/2019] Multiple Vitamin (MULTIVITAMIN WITH MINERALS) TABS tablet Take 1 tablet by mouth daily.    Derrill Memo ON 12/08/2019] pantoprazole (PROTONIX) 40 MG tablet Take 1 tablet (40 mg total) by mouth daily.    Derrill Memo ON 12/08/2019] potassium chloride SA (KLOR-CON) 20 MEQ  tablet Take 2 tablets (40 mEq total) by mouth daily.    Derrill Memo ON 12/08/2019] rosuvastatin (CRESTOR) 10 MG tablet Take 1 tablet (10 mg total) by mouth daily.    Marland Kitchen torsemide (DEMADEX) 20 MG tablet Take 1 tablet (20 mg total) by mouth daily.    Derrill Memo ON 12/08/2019] torsemide (DEMADEX) 20 MG tablet Take 2 tablets (40 mg total) by mouth daily.    . traMADol (ULTRAM) 50 MG tablet Take 1 tablet (50 mg total) by mouth every 6 (six) hours as needed for up to 7 days for moderate pain. 30 tablet     Drug Regimen Review Drug regimen was reviewed and remains appropriate with no significant issues identified  Home: Home Living Family/patient expects to be discharged to:: Private residence Living Arrangements: Spouse/significant other Available Help at Discharge: Family, Available 24 hours/day Type of Home: House Home Access: Stairs to enter [employer] of Steps: 6 Entrance Stairs-Rails: Right, Left Home Layout: Two level, Able to live on main level with bedroom/bathroom Bathroom Shower/Tub: Tub/shower unit Bathroom Toilet: Handicapped height Home Equipment: Environmental consultant - 2 wheels, Bedside commode   Functional History: Prior Function Level of Independence: Independent with assistive device(s) Comments: pt  pulls up on sink and door to stand from toilet, stands to shower, mows his grass  Functional Status:  Mobility: Bed Mobility Overal bed mobility: Needs Assistance Bed Mobility: Rolling, Sit to Sidelying Rolling: Min assist Sidelying to sit: Max assist, HOB elevated Supine to sit: Mod assist Sit to supine: Mod assist Sit to sidelying: Mod assist General bed mobility comments: in chair on arrival Transfers Overall transfer level: Needs assistance Equipment used: Rolling walker (2 wheeled) Transfers: Sit to/from Stand Sit to Stand: Min assist, +2 safety/equipment Stand pivot transfers: +2 physical assistance, Min assist General transfer comment: Min A for power up into standing.   Ambulation/Gait Ambulation/Gait assistance: Min assist, +2 safety/equipment Gait Distance (Feet): 180 Feet Assistive device: Rolling walker (2 wheeled) Gait Pattern/deviations: Trunk flexed, Shuffle, Decreased stride length General Gait Details: cues for looking up and stepping into RW. Pt walked 120' then had a seated rest and walked additional 180' with slow cautious gait with RW Gait velocity interpretation: 1.31 - 2.62 ft/sec, indicative of limited community ambulator  ADL: ADL Overall ADL's : Needs assistance/impaired Eating/Feeding: Set up, Sitting Grooming: Set up, Sitting Upper Body Bathing: Moderate assistance, Sitting Lower Body Bathing: Total assistance, +2 for physical assistance, Sit to/from stand Upper Body Dressing : Moderate assistance, Sitting Upper Body Dressing Details (indicate cue type and reason): Educating pt on donning/doffing of shirts. Pt verablized understanding and will require further education and practice.  Lower Body Dressing: +2 for physical assistance, Total assistance, Sit to/from stand Lower Body Dressing Details (indicate cue type and reason): Educating pt on compensatory techniques for LB dressing. Discussed figure four method as well as use of reacher and other AE.  Toilet Transfer: Minimal assistance, +2 for safety/equipment, RW, Ambulation (simulated to recliner) Toileting- Clothing Manipulation and Hygiene: +2 for physical assistance, Total assistance, Sit to/from stand Functional mobility during ADLs: Min guard, Rolling walker General ADL Comments: Educating pt and daughters on compensatory techniques for ADLs.   Cognition: Cognition Overall Cognitive Status: Within Functional Limits for tasks assessed Orientation Level: Oriented X4 Cognition Arousal/Alertness: Awake/alert Behavior During Therapy: WFL for tasks assessed/performed Overall Cognitive Status: Within Functional Limits for tasks assessed Area of Impairment: Problem solving,  Memory Memory: Decreased recall of precautions, Decreased short-term memory Problem Solving: Slow processing General Comments: Requiring increased time for [facility] Difficult to assess due to: Hard of hearing/deaf  Physical Exam: There were no vitals taken for this visit.   General: Alert and oriented x 3, No apparent distress HEENT: Head is normocephalic, atraumatic, PERRLA, EOMI, sclera anicteric, oral mucosa pink and moist, dentition intact, ext ear canals clear, very hard of hearing.  Neck: Supple without JVD or lymphadenopathy Heart: Reg rate and rhythm. No murmurs rubs or gallops Chest: CTA bilaterally without wheezes, rales, or rhonchi; no distress Abdomen: Soft, non-tender, non-distended, bowel sounds positive. Extremities: No clubbing, cyanosis, or edema. Pulses are 2+ Skin: Midline chest incision clean and dry.  Patient does have bilateral Unna boots in place.  Neuro: Patient is alert sitting up in chair watching television.  Makes good eye contact with examiner.  Follows full commands.  Oriented x3.  [place].  Musculoskeletal: Full ROM, No pain with AROM or PROM in the neck, trunk, or extremities. Posture appropriate Psych: Pt's affect is appropriate. Pt is cooperative  Results for orders placed or performed during the hospital encounter of 11/15/19 (from the past 48 hour(s))  Glucose, capillary     Status: Abnormal   Collection Time: 12/05/19  9:42 PM  Result  Value Ref Range   Glucose-Capillary 160 (H) 70 - 99 mg/dL    Comment: Glucose reference range applies only to samples taken after fasting for at least 8 hours.   Comment 1 Notify RN    Comment 2 Document in Chart   Basic metabolic panel     Status: Abnormal   Collection Time: 12/06/19  4:10 AM  Result Value Ref Range   Sodium 134 (L) 135 - 145 mmol/L   Potassium 3.2 (L) 3.5 - 5.1 mmol/L   Chloride 95 (L) 98 - 111 mmol/L   CO2 27 22 - 32 mmol/L   Glucose, Bld 136 (H) 70 - 99 mg/dL    Comment: Glucose  reference range applies only to samples taken after fasting for at least 8 hours.   BUN 34 (H) 8 - 23 mg/dL   Creatinine, Ser 1.53 (H) 0.61 - 1.24 mg/dL   Calcium 8.3 (L) 8.9 - 10.3 mg/dL   GFR calc non Af Amer 39 (L) >60 mL/min   GFR calc Af Amer 45 (L) >60 mL/min   Anion gap 12 5 - 15    Comment: Performed at [facility] [address] [place], [place] [address]  CBC     Status: Abnormal   Collection Time: 12/06/19  4:10 AM  Result Value Ref Range   WBC 7.0 4.0 - 10.5 K/uL   RBC 2.73 (L) 4.22 - 5.81 MIL/uL   Hemoglobin 8.8 (L) 13.0 - 17.0 g/dL   HCT 27.4 (L) 39 - 52 %   MCV 100.4 (H) 80.0 - 100.0 fL   MCH 32.2 26.0 - 34.0 pg   MCHC 32.1 30.0 - 36.0 g/dL   RDW 18.0 (H) 11.5 - 15.5 %   Platelets 287 150 - 400 K/uL   nRBC 0.0 0.0 - 0.2 %    Comment: Performed at [facility] [address] [place], [place] [address]  Cooxemetry Panel (carboxy, met, total hgb, O2 sat)     Status: Abnormal   Collection Time: 12/06/19  4:13 AM  Result Value Ref Range   Total hemoglobin 9.3 (L) 12.0 - 16.0 g/dL   O2 Saturation 57.8 %   Carboxyhemoglobin 1.4 0.5 - 1.5 %   Methemoglobin 0.6 0.0 - 1.5 %    Comment: Performed at [facility], [place] [address], [place], [place] [address]  Glucose, capillary     Status: Abnormal   Collection Time: 12/06/19  6:49 AM  Result Value Ref Range   Glucose-Capillary 114 (H) 70 - 99 mg/dL    Comment: Glucose reference range applies only to samples taken after fasting for at least 8 hours.   Comment 1 Notify RN    Comment 2 Document in Chart   Glucose, capillary     Status: Abnormal   Collection Time: 12/06/19 11:09 AM  Result Value Ref Range   Glucose-Capillary 226 (H) 70 - 99 mg/dL    Comment: Glucose reference range applies only to samples taken after fasting for at least 8 hours.  Glucose, capillary     Status: Abnormal   Collection Time: 12/06/19  4:54 PM  Result Value Ref Range   Glucose-Capillary 127 (H) 70 - 99 mg/dL    Comment:  Glucose reference range applies only to samples taken after fasting for at least 8 hours.  Glucose, capillary     Status: Abnormal   Collection Time: 12/06/19  9:07 PM  Result Value Ref Range   Glucose-Capillary 140 (H)  70 - 99 mg/dL    Comment: Glucose reference range applies only to samples taken after fasting for at least 8 hours.   Comment 1 Notify RN    Comment 2 Document in Chart   Cooxemetry Panel (carboxy, met, total hgb, O2 sat)     Status: Abnormal   Collection Time: 12/07/19  4:50 AM  Result Value Ref Range   Total hemoglobin 9.2 (L) 12.0 - 16.0 g/dL   O2 Saturation 61.3 %   Carboxyhemoglobin 1.5 0.5 - 1.5 %   Methemoglobin 0.7 0.0 - 1.5 %    Comment: Performed at [facility], [place] [address] [place], [place] [address]  Basic metabolic panel     Status: Abnormal   Collection Time: 12/07/19  4:50 AM  Result Value Ref Range   Sodium 137 135 - 145 mmol/L   Potassium 3.7 3.5 - 5.1 mmol/L   Chloride 98 98 - 111 mmol/L   CO2 27 22 - 32 mmol/L   Glucose, Bld 99 70 - 99 mg/dL    Comment: Glucose reference range applies only to samples taken after fasting for at least 8 hours.   BUN 36 (H) 8 - 23 mg/dL   Creatinine, Ser 1.46 (H) 0.61 - 1.24 mg/dL   Calcium 8.4 (L) 8.9 - 10.3 mg/dL   GFR calc non Af Amer 41 (L) >60 mL/min   GFR calc Af Amer 48 (L) >60 mL/min   Anion gap 12 5 - 15    Comment: Performed at [facility] [address], [place], [place] [address]  CBC     Status: Abnormal   Collection Time: 12/07/19  4:50 AM  Result Value Ref Range   WBC 7.7 4.0 - 10.5 K/uL   RBC 2.75 (L) 4.22 - 5.81 MIL/uL   Hemoglobin 8.8 (L) 13.0 - 17.0 g/dL   HCT 27.6 (L) 39 - 52 %   MCV 100.4 (H) 80.0 - 100.0 fL   MCH 32.0 26.0 - 34.0 pg   MCHC 31.9 30.0 - 36.0 g/dL   RDW 18.3 (H) 11.5 - 15.5 %   Platelets 271 150 - 400 K/uL   nRBC 0.0 0.0 - 0.2 %    Comment: Performed at [facility], [place] [address] [place], [place] [address]  Glucose, capillary     Status:  Abnormal   Collection Time: 12/07/19  6:19 AM  Result Value Ref Range   Glucose-Capillary 107 (H) 70 - 99 mg/dL    Comment: Glucose reference range applies only to samples taken after fasting for at least 8 hours.   Comment 1 Notify RN    Comment 2 Document in Chart   Glucose, capillary     Status: Abnormal   Collection Time: 12/07/19 11:02 AM  Result Value Ref Range   Glucose-Capillary 315 (H) 70 - 99 mg/dL    Comment: Glucose reference range applies only to samples taken after fasting for at least 8 hours.   Comment 1 Notify RN    Comment 2 Document in Chart   Glucose, capillary     Status: Abnormal   Collection Time: 12/07/19  4:48 PM  Result Value Ref Range   Glucose-Capillary 40 (LL) 70 - 99 mg/dL    Comment: Glucose reference range applies only to samples taken after fasting for at least 8 hours.   Comment 1 Notify RN    Comment 2 Document in Chart   Glucose, capillary     Status: Abnormal  Collection Time: 12/07/19  4:50 PM  Result Value Ref Range   Glucose-Capillary 47 (L) 70 - 99 mg/dL    Comment: Glucose reference range applies only to samples taken after fasting for at least 8 hours.   Comment 1 Notify RN    Comment 2 Document in Chart   Glucose, capillary     Status: None   Collection Time: 12/07/19  5:22 PM  Result Value Ref Range   Glucose-Capillary 93 70 - 99 mg/dL    Comment: Glucose reference range applies only to samples taken after fasting for at least 8 hours.   No results found.  Medical Problem List and Plan: 1.  Debility secondary to severe multivessel CAD plus critical AS.  Status post CABG x4 with bioprosthetic AVR 11/22/2019.  Sternal precautions.  -patient may shower but midline incision must be covered  -ELOS/Goals: 7-10 days 2.  Antithrombotics: -DVT/anticoagulation: Lovenox  -antiplatelet therapy: Aspirin 325 mg daily 3. Pain Management: Oxycodone as needed 4. Mood: Provide emotional support  -antipsychotic agents: N/A 5. Neuropsych: This  patient is capable of making decisions on his own behalf. 6. Skin/Wound Care: Routine skin checks.  Patient does have bilateral Unna boots in place. 7. Fluids/Electrolytes/Nutrition: Routine in and outs with follow-up chemistries.   7/6: Na and K stable. Cr downtrending. 8.  Acute blood loss anemia.  Follow-up CBCs weekly. Stable at 8.8 on 7/6.  9.  Post cardiotomy shock.  Continue ProAmatine as directed as well as Demadex.  Monitor for any signs of fluid overload 10.  Diabetes mellitus.  Hemoglobin A1c 7.4.  SSI.  Currently on Levemir 16 units daily.  Patient on Glucotrol 5 mg daily prior to admission and resume as needed in place of insulin.   7/6: CBGs dropped to 40s this afternoon. Decrease Levemir to 15U.  11.  Hyperlipidemia.  Crestor 12.  Hypothyroidism.  Synthroid 13.  Constipation.  Continue Colace and Dulcolax tablet as directed.  Lauraine Rinne, PA-C  I have personally performed a face to face diagnostic evaluation, including, but not limited to relevant history and physical exam findings, of this patient and developed relevant assessment and plan.  Additionally, I have reviewed and concur with the physician assistant's documentation above.  The patient's status has not changed. The original post admission physician evaluation remains appropriate, and any changes from the pre-admission screening or documentation from the acute chart are noted above.   Izora Ribas, MD 12/07/2019

## 2019-12-07 NOTE — Progress Notes (Signed)
PMR Admission Coordinator Pre-Admission Assessment   Patient: William Walker is an 86 y.o., male MRN: [id number] DOB: 03/08/1927 Height: '5\' 11"'$  (180.3 cm) Weight: 95.2 kg   Insurance Information HMO: yes    PPO:      PCP:      IPA:      80/20:      OTHER:  PRIMARY: [employer] Medicare      Policy#: [id number]      Subscriber: pt CM Name:       Phone#: [phone]     Fax#: [phone] Pre-Cert#: [id number] auth for CIR provided by Janett Billow with Bernadene Bell with updates due to fax listed above on 7/12.       Employer: n/a Benefits:  Phone #: [phone]     Name:  Eff. Date: 06/04/19     Deduct: $0      Out of Pocket Max: $3600 (164.27 met)      Life Max: n/a CIR: $295/day for days 1-5      SNF: 20 full days Outpatient:      Co-Pay: $30/visit Home Health: 100%      Co-Pay:  DME: 80%     Co-Pay: 20% Providers:   SECONDARY:       Policy#:      Phone#:    Development worker, community:       Phone#:    The Therapist, art Information Summary" for patients in Inpatient Rehabilitation Facilities with attached "Privacy Act [place] Records" was provided and verbally reviewed with: Patient and Family   Emergency Contact Information         Contact Information     Name Relation Home Work Mobile    Marek,Virgie Spouse [phone]        RANALD, ALESSIO     [phone]    Ardeth Perfect Daughter [phone]   [phone]    Geoffery Spruce Daughter     [phone]         Current Medical History  Patient Admitting Diagnosis: CABG x4, AVR   History of Present Illness: William Walker is a 86 year old right-handed male with history of severe calcified aortic stenosis, chronic exertional angina, multivessel CAD with moderate diffuse disease and patent LAD stent since his last cardiac catheterization 2019 as well as history of hypertension, hyperlipidemia, obesity with BMI 29.28 and type 2 diabetes mellitus.  Presented 11/15/2019 with exertional chest pain.  Patient has been followed by Dr.  Domenic Polite of cardiology services as an outpatient.  Chest x-ray with no active disease.  EKG with normal sinus rhythm with first-degree AV block with ST segment depressions in lateral and inferior leads that showed change from prior tracings 09/13/2019.  Troponin 4003.  He was loaded with Plavix.  Underwent cardiac catheterization showed 80% left main obstruction, proximal LAD obstruction of 80 to 90%, 70% obstruction of the first obtuse marginal and 90% obstruction of the intermediate and 75% mid right obstruction.  Patient underwent aortic valve replacement with CABG x4 11/22/2019 per Dr. Servando Snare.  Postoperative pain management with sternal precautions.  He was slowly extubated.  Post cardiotomy shock he was on milrinone for a short time.  Follow-up echocardiogram 11/30/2019 showed ejection fraction of 29% grade 2 diastolic dysfunction.  Patient currently maintained on full-strength aspirin 325 mg daily.  Subcutaneous Lovenox for DVT prophylaxis.  He is tolerating mechanical soft diet.  Acute blood loss anemia 8.8 and monitored.  He did have some AKI 1.67-1.46 and monitored.  Therapy evaluations completed and patient was recommended for a comprehensive rehab program  Patient's medical record from [facility] has been reviewed by the rehabilitation admission coordinator and physician.   Past Medical History      Past Medical History:  Diagnosis Date  . Aortic stenosis    . Coronary atherosclerosis of native coronary artery      Reportedly 2 stents - presumably LAD (records not available)  . CVD (cerebrovascular disease)    . Essential hypertension    . GERD (gastroesophageal reflux disease)    . Hyperlipidemia    . MI (myocardial infarction) [facility])      October 1999  . Type 2 diabetes mellitus with diabetic neuropathy (HCC)        Family History   family history includes Coronary artery disease in his father.   Prior Rehab/Hospitalizations Has the patient had prior rehab or  hospitalizations prior to admission? No   Has the patient had major surgery during 100 days prior to admission? Yes              Current Medications   Current Facility-Administered Medications:  .  0.9 %  sodium chloride infusion, 250 mL, Intravenous, Continuous, Roddenberry, Myron G, PA-C .  0.9 %  sodium chloride infusion, , Intravenous, Continuous, Roddenberry, Myron G, PA-C, Stopped at 11/24/19 1458 .  aspirin EC tablet 325 mg, 325 mg, Oral, Daily, 325 mg at 12/07/19 7893 **OR** aspirin chewable tablet 324 mg, 324 mg, Per Tube, Daily, Roddenberry, Myron G, PA-C .  bisacodyl (DULCOLAX) EC tablet 10 mg, 10 mg, Oral, Daily, 10 mg at 12/07/19 0823 **OR** bisacodyl (DULCOLAX) suppository 10 mg, 10 mg, Rectal, Daily, Roddenberry, Myron G, PA-C .  Chlorhexidine Gluconate Cloth 2 % PADS 6 each, 6 each, Topical, Daily, [name], MD, 6 each at 12/07/19 [phone] .  dextrose 50 % solution 0-50 mL, 0-50 mL, Intravenous, PRN, Roddenberry, Myron G, PA-C .  docusate sodium (COLACE) capsule 200 mg, 200 mg, Oral, Daily, Roddenberry, Myron G, PA-C, 200 mg at 12/07/19 0823 .  enoxaparin (LOVENOX) injection 30 mg, 30 mg, Subcutaneous, Q24H, [name], MD, 30 mg at 12/06/19 2120 .  feeding supplement (BOOST / RESOURCE BREEZE) liquid 1 Container, 1 Container, Oral, TID BM, [name], MD, 1 Container at 12/07/19 [phone] .  insulin aspart (novoLOG) injection 0-24 Units, 0-24 Units, Subcutaneous, TID AC & HS, [name], MD, 2 Units at 12/06/19 2119 .  insulin detemir (LEVEMIR) injection 16 Units, 16 Units, Subcutaneous, Daily, Antony Odea, PA-C, 16 Units at 12/07/19 2585 .  lactated ringers infusion, , Intravenous, Continuous, Roddenberry, Myron G, PA-C .  levothyroxine (SYNTHROID) tablet 50 mcg, 50 mcg, Oral, QAC breakfast, Antony Odea, PA-C, 50 mcg at 12/07/19 2778 .  MEDLINE mouth rinse, 15 mL, Mouth Rinse, BID, [name], MD, 15 mL at 12/07/19 0827 .   metoprolol tartrate (LOPRESSOR) injection 2.5-5 mg, 2.5-5 mg, Intravenous, Q2H PRN, Roddenberry, Myron G, PA-C .  midodrine (PROAMATINE) tablet 10 mg, 10 mg, Oral, TID WC, Bensimhon, Shaune Pascal, MD, 10 mg at 12/07/19 2423 .  morphine 2 MG/ML injection 1 mg, 1 mg, Intravenous, Q1H PRN, [name], MD .  multivitamin with minerals tablet 1 tablet, 1 tablet, Oral, Daily, [name], MD, 1 tablet at 12/07/19 [phone] .  ondansetron (ZOFRAN) injection 4 mg, 4 mg, Intravenous, Q6H PRN, Antony Odea, PA-C, 4 mg at 11/26/19 2329 .  oxyCODONE (Oxy IR/ROXICODONE) immediate release tablet 5 mg, 5 mg, Oral, Q4H PRN, [name], MD, 5  mg at 11/24/19 2300 .  pantoprazole (PROTONIX) EC tablet 40 mg, 40 mg, Oral, Daily, Roddenberry, Cecille Amsterdam, PA-C, 40 mg at 12/07/19 6881 .  potassium chloride SA (KLOR-CON) CR tablet 40 mEq, 40 mEq, Oral, Daily, Bensimhon, Bevelyn Buckles, MD, 40 mEq at 12/07/19 5722 .  rosuvastatin (CRESTOR) tablet 10 mg, 10 mg, Oral, Daily, Bensimhon, Bevelyn Buckles, MD, 10 mg at 12/07/19 2807 .  sodium chloride flush (NS) 0.9 % injection 10-40 mL, 10-40 mL, Intracatheter, Q12H, Delight Ovens, MD, 10 mL at 12/07/19 0827 .  sodium chloride flush (NS) 0.9 % injection 10-40 mL, 10-40 mL, Intracatheter, PRN, Delight Ovens, MD .  sodium chloride flush (NS) 0.9 % injection 3 mL, 3 mL, Intravenous, Q12H, Roddenberry, Myron G, PA-C, 3 mL at 12/06/19 2120 .  sodium chloride flush (NS) 0.9 % injection 3 mL, 3 mL, Intravenous, PRN, Roddenberry, Myron G, PA-C .  torsemide (DEMADEX) tablet 20 mg, 20 mg, Oral, Daily, Bensimhon, Bevelyn Buckles, MD, 20 mg at 12/06/19 1716 .  torsemide (DEMADEX) tablet 40 mg, 40 mg, Oral, Daily, Bensimhon, Bevelyn Buckles, MD, 40 mg at 12/07/19 0829 .  traMADol (ULTRAM) tablet 50 mg, 50 mg, Oral, Q6H PRN, Delight Ovens, MD, 50 mg at 12/05/19 0020   Patients Current Diet:     Diet Order                      DIET DYS 3 Room service appropriate? Yes; Fluid  consistency: Thin  Diet effective now                      Precautions / Restrictions Precautions Precautions: Fall, Sternal Precaution Booklet Issued: No Precaution Comments: educated in sternal precautions Restrictions Weight Bearing Restrictions: No    Has the patient had 2 or more falls or a fall with injury in the past year? No   Prior Activity Level Community (5-7x/wk): some difficulty with his transitions, but mod I, and able to care for the home prior to admission; daughter does state that pt had a shuffling gait at baseline   Prior Functional Level Self Care: Did the patient need help bathing, dressing, using the toilet or eating? Independent   Indoor Mobility: Did the patient need assistance with walking from room to room (with or without device)? Independent   Stairs: Did the patient need assistance with internal or external stairs (with or without device)? Independent   Functional Cognition: Did the patient need help planning regular tasks such as shopping or remembering to take medications? Independent   Home Assistive Devices / Equipment Home Assistive Devices/Equipment: None Home Equipment: Walker - 2 wheels, Bedside commode   Prior Device Use: Indicate devices/aids used by the patient prior to current illness, exacerbation or injury? none   Current Functional Level Cognition   Overall Cognitive Status: Within Functional Limits for tasks assessed Difficult to assess due to: Hard of hearing/deaf Orientation Level: Oriented X4 General Comments: Requiring increased time for [facility]    Extremity Assessment (includes Sensation/Coordination)   Upper Extremity Assessment: Generalized weakness  Lower Extremity Assessment: Defer to PT evaluation     ADLs   Overall ADL's : Needs assistance/impaired Eating/Feeding: Set up, Sitting Grooming: Set up, Sitting Upper Body Bathing: Moderate assistance, Sitting Lower Body Bathing: Total assistance, +2 for physical  assistance, Sit to/from stand Upper Body Dressing : Moderate assistance, Sitting Upper Body Dressing Details (indicate cue type and reason): Educating pt on donning/doffing of shirts. Pt verablized  understanding and will require further education and practice.  Lower Body Dressing: +2 for physical assistance, Total assistance, Sit to/from stand Lower Body Dressing Details (indicate cue type and reason): Educating pt on compensatory techniques for LB dressing. Discussed figure four method as well as use of reacher and other AE.  Toilet Transfer: Minimal assistance, +2 for safety/equipment, RW, Ambulation (simulated to recliner) Toileting- Clothing Manipulation and Hygiene: +2 for physical assistance, Total assistance, Sit to/from stand Functional mobility during ADLs: Min guard, Rolling walker General ADL Comments: Educating pt and daughters on compensatory techniques for ADLs.      Mobility   Overal bed mobility: Needs Assistance Bed Mobility: Rolling, Sit to Sidelying Rolling: Min assist Sidelying to sit: Max assist, HOB elevated Supine to sit: Mod assist Sit to supine: Mod assist Sit to sidelying: Mod assist General bed mobility comments: in chair on arrival     Transfers   Overall transfer level: Needs assistance Equipment used: Rolling walker (2 wheeled) Transfers: Sit to/from Stand Sit to Stand: Min assist, +2 safety/equipment Stand pivot transfers: +2 physical assistance, Min assist General transfer comment: Min A for power up into standing.      Ambulation / Gait / Stairs / Wheelchair Mobility   Ambulation/Gait Ambulation/Gait assistance: Min assist, +2 safety/equipment Gait Distance (Feet): 180 Feet Assistive device: Rolling walker (2 wheeled) Gait Pattern/deviations: Trunk flexed, Shuffle, Decreased stride length General Gait Details: cues for looking up and stepping into RW. Pt walked 120' then had a seated rest and walked additional 180' with slow cautious gait with  RW Gait velocity interpretation: 1.31 - 2.62 ft/sec, indicative of limited community ambulator     Posture / Balance Dynamic Sitting Balance Sitting balance - Comments: EOB with guarding Balance Overall balance assessment: Needs assistance Sitting-balance support: Feet supported Sitting balance-Leahy Scale: Fair Sitting balance - Comments: EOB with guarding Standing balance support: No upper extremity supported, During functional activity, Bilateral upper extremity supported Standing balance-Leahy Scale: Poor Standing balance comment: bil UE support on RW in standing     Special needs/care consideration Skin sternal incision, Diabetic management yes and Designated visitor [place] (from acute therapy documentation) Living Arrangements: Spouse/significant other Available Help at Discharge: Family, Available 24 hours/day Type of Home: House Home Layout: Two level, Able to live on main level with bedroom/bathroom Home Access: Stairs to enter Entrance Stairs-Rails: Right, Left Entrance Stairs-Number of Steps: 6 Bathroom Shower/Tub: Chiropodist: Handicapped Coulter: No   Discharge Living Setting Plans for Discharge Living Setting: Patient's home Type of Home at Discharge: House Discharge Home Layout: Two level, Able to live on main level with bedroom/bathroom Discharge Home Access: Stairs to enter Entrance Stairs-Rails: Right, Left Entrance Stairs-Number of Steps: 7 Discharge Bathroom Shower/Tub: Tub/shower unit Discharge Bathroom Toilet: Handicapped height Discharge Bathroom Accessibility: Yes How Accessible: Accessible via walker Does the patient have any problems obtaining your medications?: No   Social/Family/Support Systems Patient Roles: Spouse (Virgie is in her 19s) Anticipated Caregiver: Joelene Millin is main contact.  Pt has his wife at home, 2 daughters, and a son Anticipated Caregiver's Contact  Information: Joelene Millin [phone] Ability/Limitations of Caregiver: n/a Caregiver Availability: 24/7 Discharge Plan Discussed with Primary Caregiver: Yes Is Caregiver In Agreement with Plan?: Yes Does Caregiver/Family have Issues with Lodging/Transportation while Pt is in Rehab?: No   Goals Patient/Family Goal for Rehab: PT/OT supervision to mod I Expected length of stay: 7-10 days Additional Information: pt very HOH Pt/Family Agrees to Admission and  willing to participate: Yes Program Orientation Provided & Reviewed with Pt/Caregiver Including Roles  & Responsibilities: Yes  Barriers to Discharge: Insurance for SNF coverage   Decrease burden of Care through IP rehab admission: n/a   Possible need for SNF placement upon discharge: Not anticipated   Patient Condition: I have reviewed medical records from [facility], spoken with CM, and patient and daughter. I met with patient at the bedside for inpatient rehabilitation assessment.  Patient will benefit from ongoing PT and OT, can actively participate in 3 hours of therapy a day 5 days of the week, and can make measurable gains during the admission.  Patient will also benefit from the coordinated team approach during an Inpatient Acute Rehabilitation admission.  The patient will receive intensive therapy as well as Rehabilitation physician, nursing, social worker, and care management interventions.  Due to safety, skin/wound care, disease management, medication administration, pain management and patient education the patient requires 24 hour a day rehabilitation nursing.  The patient is currently min assist with mobility and basic ADLs.  Discharge setting and therapy post discharge at home with home health is anticipated.  Patient has agreed to participate in the Acute Inpatient Rehabilitation Program and will admit today.   Preadmission Screen Completed By:  Michel Santee, PT, DPT 12/07/2019 10:33  AM ______________________________________________________________________   Discussed status with Dr. Ranell Patrick on 12/07/19  at 10:38 AM  and received approval for admission today.   Admission Coordinator:  Michel Santee, PT, DPT time 10:38 AM Sudie Grumbling 12/07/19     Assessment/Plan: Diagnosis: Debility following CABGx4 and aortic valve replacement 1. Does the need for close, 24 hr/day Medical supervision in concert with the patient's rehab needs make it unreasonable for this patient to be served in a less intensive setting? Yes 2. Co-Morbidities requiring supervision/potential complications: HLD, severe aortic stenosis, right inguinal hernia, essential HTN, bilateral carotid artery disease 3. Due to bladder management, bowel management, safety, skin/wound care, disease management, medication administration, pain management and patient education, does the patient require 24 hr/day rehab nursing? Yes 4. Does the patient require coordinated care of a physician, rehab nurse, PT and OT to address physical and functional deficits in the context of the above medical diagnosis(es)? Yes Addressing deficits in the following areas: balance, endurance, locomotion, strength, transferring, bowel/bladder control, bathing, dressing, feeding, grooming, toileting and psychosocial support 5. Can the patient actively participate in an intensive therapy program of at least 3 hrs of therapy 5 days a week? Yes 6. The potential for patient to make measurable gains while on inpatient rehab is excellent 7. Anticipated functional outcomes upon discharge from inpatient rehab: modified independent PT, modified independent OT, independent SLP 8. Estimated rehab length of stay to reach the above functional goals is: 5-7 days 9. Anticipated discharge destination: Home 10. Overall Rehab/Functional Prognosis: excellent     MD Signature: Leeroy Cha, MD

## 2019-12-07 NOTE — Progress Notes (Signed)
PT Cancellation Note  Patient Details Name: William Walker MRN: [id number] DOB: 1926/08/03   Cancelled Treatment:    Reason Eval/Treat Not Completed: Patient declined, no reason specified; patient in recliner and had walked with cardiac rehab this am.  Now with lotion on his feet due to burning with neuropathy all night.  RN states plans for d/c to CIR today.  Will defer due to above and if not d/c will check back another day.   Reginia Naas 12/07/2019, 10:57 AM  Magda Kiel, PT Acute Rehabilitation Services MMITV:[phone] Office:[phone] 12/07/2019

## 2019-12-07 NOTE — Progress Notes (Addendum)
[address]       [place] [address]             [phone]      15 Days Post-Op Procedure(s) (LRB): CORONARY ARTERY BYPASS GRAFTING (CABG), ON PUMP, TIMES FOUR , USING LEFT INTERNAL MAMMARY ARTERY AND ENDOSCOPICALLY HARVESTED RIGHT GREATER SAPHENOUS VEIN (N/A) AORTIC VALVE REPLACEMENT (AVR) USING INSPIRIS 23MM (N/A) TRANSESOPHAGEAL ECHOCARDIOGRAM (TEE) (N/A) Subjective: C/o neuropathy discomfort in LE's  Objective: Vital signs in last 24 hours: Temp:  [97.7 F (36.5 C)-98.2 F (36.8 C)] 97.7 F (36.5 C) (07/06 0725) Pulse Rate:  [70-89] 70 (07/06 0725) Cardiac Rhythm: Normal sinus rhythm (07/06 0725) Resp:  [15-20] 15 (07/05 2328) BP: (103-128)/(54-66) 103/54 (07/05 2328) SpO2:  [96 %-98 %] 96 % (07/05 2328) Weight:  [95.2 kg] 95.2 kg (07/06 0504)  Hemodynamic parameters for last 24 hours: CVP:  [10 mmHg-13 mmHg] 13 mmHg  Intake/Output from previous day: 07/05 0701 - 07/06 0700 In: 560 [P.O.:560] Out: 2525 [Urine:2525] Intake/Output this shift: Total I/O In: 240 [P.O.:240] Out: -   General appearance: alert, cooperative and no distress Heart: regular rate and rhythm Lungs: clear to auscultation bilaterally Abdomen: benign Extremities: + LE edema Wound: incis healing well  Lab Results: Recent Labs    12/06/19 0410 12/07/19 0450  WBC 7.0 7.7  HGB 8.8* 8.8*  HCT 27.4* 27.6*  PLT 287 271   BMET:  Recent Labs    12/06/19 0410 12/07/19 0450  NA 134* 137  K 3.2* 3.7  CL 95* 98  CO2 27 27  GLUCOSE 136* 99  BUN 34* 36*  CREATININE 1.53* 1.46*  CALCIUM 8.3* 8.4*    PT/INR: No results for input(s): LABPROT, INR in the last 72 hours. ABG    Component Value Date/Time   PHART 7.323 (L) 11/23/2019 0211   HCO3 18.4 (L) 11/23/2019 0211   TCO2 19 (L) 11/23/2019 0211   ACIDBASEDEF 7.0 (H) 11/23/2019 0211   O2SAT 61.3 12/07/2019 0450   CBG (last 3)  Recent Labs    12/06/19 1654 12/06/19 2107 12/07/19 0619  GLUCAP 127* 140*  107*    Meds Scheduled Meds: . aspirin EC  325 mg Oral Daily   Or  . aspirin  324 mg Per Tube Daily  . bisacodyl  10 mg Oral Daily   Or  . bisacodyl  10 mg Rectal Daily  . Chlorhexidine Gluconate Cloth  6 each Topical Daily  . docusate sodium  200 mg Oral Daily  . enoxaparin (LOVENOX) injection  30 mg Subcutaneous Q24H  . feeding supplement  1 Container Oral TID BM  . insulin aspart  0-24 Units Subcutaneous TID AC & HS  . insulin detemir  16 Units Subcutaneous Daily  . levothyroxine  50 mcg Oral QAC breakfast  . mouth rinse  15 mL Mouth Rinse BID  . midodrine  10 mg Oral TID WC  . multivitamin with minerals  1 tablet Oral Daily  . pantoprazole  40 mg Oral Daily  . potassium chloride  40 mEq Oral Daily  . rosuvastatin  10 mg Oral Daily  . sodium chloride flush  10-40 mL Intracatheter Q12H  . sodium chloride flush  3 mL Intravenous Q12H  . torsemide  20 mg Oral Daily  . torsemide  40 mg Oral Daily   Continuous Infusions: . sodium chloride    . sodium chloride Stopped (11/24/19 1458)  . lactated ringers     PRN Meds:.dextrose, metoprolol tartrate, morphine  injection, ondansetron (ZOFRAN) IV, oxyCODONE, sodium chloride flush, sodium chloride flush, traMADol  Xrays No results found.  Assessment/Plan: S/P Procedure(s) (LRB): CORONARY ARTERY BYPASS GRAFTING (CABG), ON PUMP, TIMES FOUR , USING LEFT INTERNAL MAMMARY ARTERY AND ENDOSCOPICALLY HARVESTED RIGHT GREATER SAPHENOUS VEIN (N/A) AORTIC VALVE REPLACEMENT (AVR) USING INSPIRIS 23MM (N/A) TRANSESOPHAGEAL ECHOCARDIOGRAM (TEE) (N/A)  1 doing well overall 2 afeb, VSS 3 sats good on RA 4 renal fxn stable, AHF dosing diuretics 5 ABL anemia is stable- monitor 6 BS adeq control 7 awaits CIR, cont rehab   LOS: 22 days    John Giovanni PA-C Pager [phone] 12/07/2019  Bed available today for CIR unit.  Patient would benefit from extensive physical therapy for proximal muscle weakness and to reduce risk for falls.   Surgical incisions look well.  Heart failure has signed off after titrating medications.  Coox  today 60%.  Stop CVP measurements and a.m. coox loss.  patient examined and medical record reviewed,agree with above note. Tharon Aquas Trigt III 12/07/2019

## 2019-12-07 NOTE — Progress Notes (Signed)
Patient admitted to 4w19. A&Ox4, hard of hearing. Oriented to unit, fall and visitor policies

## 2019-12-07 NOTE — Progress Notes (Signed)
Hypoglycemic Event  CBG: 47  Treatment: Hypoglycemia protocol followed, 8oz orange juice given, peaches eaten  Symptoms: None  Follow-up CBG: Time: 1722 CBG Result:93  Possible Reasons for Event: 16u given at lunchtime  Comments/MD notified:    [place] Guardian Jaydian Santana

## 2019-12-07 NOTE — TOC Transition Note (Signed)
Transition of Care [facility]) - CM/SW Discharge Note   Patient Details  Name: William Walker MRN: [id number] Date of Birth: 03/22/1927  [facility]) CM/SW Contact:  Zenon Mayo, RN Phone Number: 12/07/2019, 10:32 AM   Clinical Narrative:    Patient for dc to CIR today.    Final next level of care: IP Rehab Facility Barriers to Discharge: No Barriers Identified   Patient Goals and CMS Choice Patient states their goals for this hospitalization and ongoing recovery are:: IP Rehab      Discharge Placement                       Discharge Plan and Services                  DME Agency: NA       HH Arranged: NA          Social Determinants of Health (SDOH) Interventions     Readmission Risk Interventions No flowsheet data found.

## 2019-12-07 NOTE — Plan of Care (Signed)
  Problem: Education: Goal: Knowledge of General Education information will improve Description: Including pain rating scale, medication(s)/side effects and non-pharmacologic comfort measures Outcome: Progressing   Problem: Clinical Measurements: Goal: Ability to maintain clinical measurements within normal limits will improve Outcome: Progressing Goal: Will remain free from infection Outcome: Progressing Goal: Cardiovascular complication will be avoided Outcome: Progressing   Problem: Nutrition: Goal: Adequate nutrition will be maintained Outcome: Progressing   

## 2019-12-07 NOTE — Progress Notes (Signed)
Orthopedic Tech Progress Note Patient Details:  William Walker 04/16/1927 [id number]  Ortho Devices Type of Ortho Device: Haematologist Ortho Device/Splint Location: BLE Ortho Device/Splint Interventions: Application   Post Interventions Patient Tolerated: Well Instructions Provided: Care of [place] 12/07/2019, 2:20 PM

## 2019-12-08 ENCOUNTER — Inpatient Hospital Stay (HOSPITAL_COMMUNITY): Payer: Medicare Other | Admitting: Occupational Therapy

## 2019-12-08 ENCOUNTER — Inpatient Hospital Stay (HOSPITAL_COMMUNITY): Payer: Medicare Other

## 2019-12-08 LAB — CBC WITH DIFFERENTIAL/PLATELET
Abs Immature Granulocytes: 0.01 10*3/uL (ref 0.00–0.07)
Basophils Absolute: 0.1 10*3/uL (ref 0.0–0.1)
Basophils Relative: 1 %
Eosinophils Absolute: 0.2 10*3/uL (ref 0.0–0.5)
Eosinophils Relative: 4 %
HCT: 27.8 % — ABNORMAL LOW (ref 39.0–52.0)
Hemoglobin: 9 g/dL — ABNORMAL LOW (ref 13.0–17.0)
Immature Granulocytes: 0 %
Lymphocytes Relative: 19 %
Lymphs Abs: 1.2 10*3/uL (ref 0.7–4.0)
MCH: 32.7 pg (ref 26.0–34.0)
MCHC: 32.4 g/dL (ref 30.0–36.0)
MCV: 101.1 fL — ABNORMAL HIGH (ref 80.0–100.0)
Monocytes Absolute: 0.5 10*3/uL (ref 0.1–1.0)
Monocytes Relative: 9 %
Neutro Abs: 4.2 10*3/uL (ref 1.7–7.7)
Neutrophils Relative %: 67 %
Platelets: 258 10*3/uL (ref 150–400)
RBC: 2.75 MIL/uL — ABNORMAL LOW (ref 4.22–5.81)
RDW: 18.5 % — ABNORMAL HIGH (ref 11.5–15.5)
WBC: 6.2 10*3/uL (ref 4.0–10.5)
nRBC: 0 % (ref 0.0–0.2)

## 2019-12-08 LAB — GLUCOSE, CAPILLARY
Glucose-Capillary: 90 mg/dL (ref 70–99)
Glucose-Capillary: 145 mg/dL — ABNORMAL HIGH (ref 70–99)
Glucose-Capillary: 108 mg/dL — ABNORMAL HIGH (ref 70–99)
Glucose-Capillary: 258 mg/dL — ABNORMAL HIGH (ref 70–99)

## 2019-12-08 LAB — COMPREHENSIVE METABOLIC PANEL
ALT: 101 U/L — ABNORMAL HIGH (ref 0–44)
AST: 88 U/L — ABNORMAL HIGH (ref 15–41)
Albumin: 2.7 g/dL — ABNORMAL LOW (ref 3.5–5.0)
Alkaline Phosphatase: 74 U/L (ref 38–126)
Anion gap: 12 (ref 5–15)
BUN: 32 mg/dL — ABNORMAL HIGH (ref 8–23)
CO2: 27 mmol/L (ref 22–32)
Calcium: 8.4 mg/dL — ABNORMAL LOW (ref 8.9–10.3)
Chloride: 97 mmol/L — ABNORMAL LOW (ref 98–111)
Creatinine, Ser: 1.47 mg/dL — ABNORMAL HIGH (ref 0.61–1.24)
GFR calc Af Amer: 48 mL/min — ABNORMAL LOW (ref >60)
GFR calc non Af Amer: 41 mL/min — ABNORMAL LOW (ref >60)
Glucose, Bld: 96 mg/dL (ref 70–99)
Potassium: 3.3 mmol/L — ABNORMAL LOW (ref 3.5–5.1)
Sodium: 136 mmol/L (ref 135–145)
Total Bilirubin: 1 mg/dL (ref 0.3–1.2)
Total Protein: 5.6 g/dL — ABNORMAL LOW (ref 6.5–8.1)

## 2019-12-08 MED ORDER — CALCIUM CITRATE 950 (200 CA) MG PO TABS
200.0000 mg | ORAL_TABLET | Freq: Every day | ORAL | Status: DC
  Administered 2019-12-08 – 2019-12-16 (×9): 200 mg via ORAL
  Filled 2019-12-08 (×9): qty 1

## 2019-12-08 MED ORDER — POTASSIUM CHLORIDE CRYS ER 20 MEQ PO TBCR
40.0000 meq | EXTENDED_RELEASE_TABLET | Freq: Two times a day (BID) | ORAL | Status: DC
  Administered 2019-12-08 – 2019-12-09 (×2): 40 meq via ORAL
  Filled 2019-12-08 (×2): qty 2

## 2019-12-08 NOTE — Plan of Care (Signed)
  Problem: RH Balance Goal: LTG: Patient will maintain dynamic sitting balance (OT) Description: LTG:  Patient will maintain dynamic sitting balance with assistance during activities of daily living (OT) Flowsheets (Taken 12/08/2019 1252) LTG: Pt will maintain dynamic sitting balance during ADLs with: Independent Goal: LTG Patient will maintain dynamic standing with ADLs (OT) Description: LTG:  Patient will maintain dynamic standing balance with assist during activities of daily living (OT)  Flowsheets (Taken 12/08/2019 1252) LTG: Pt will maintain dynamic standing balance during ADLs with: Supervision/Verbal cueing   Problem: Sit to Stand Goal: LTG:  Patient will perform sit to stand in prep for activites of daily living with assistance level (OT) Description: LTG:  Patient will perform sit to stand in prep for activites of daily living with assistance level (OT) Flowsheets (Taken 12/08/2019 1252) LTG: PT will perform sit to stand in prep for activites of daily living with assistance level: Independent with assistive device   Problem: RH Grooming Goal: LTG Patient will perform grooming w/assist,cues/equip (OT) Description: LTG: Patient will perform grooming with assist, with/without cues using equipment (OT) Flowsheets (Taken 12/08/2019 1252) LTG: Pt will perform grooming with assistance level of: Set up assist    Problem: RH Bathing Goal: LTG Patient will bathe all body parts with assist levels (OT) Description: LTG: Patient will bathe all body parts with assist levels (OT) Flowsheets (Taken 12/08/2019 1252) LTG: Pt will perform bathing with assistance level/cueing: Supervision/Verbal cueing   Problem: RH Dressing Goal: LTG Patient will perform upper body dressing (OT) Description: LTG Patient will perform upper body dressing with assist, with/without cues (OT). Flowsheets (Taken 12/08/2019 1252) LTG: Pt will perform upper body dressing with assistance level of: Supervision/Verbal cueing Goal:  LTG Patient will perform lower body dressing w/assist (OT) Description: LTG: Patient will perform lower body dressing with assist, with/without cues in positioning using equipment (OT) Flowsheets (Taken 12/08/2019 1252) LTG: Pt will perform lower body dressing with assistance level of: Supervision/Verbal cueing   Problem: RH Toileting Goal: LTG Patient will perform toileting task (3/3 steps) with assistance level (OT) Description: LTG: Patient will perform toileting task (3/3 steps) with assistance level (OT)  Flowsheets (Taken 12/08/2019 1252) LTG: Pt will perform toileting task (3/3 steps) with assistance level: Supervision/Verbal cueing   Problem: RH Toilet Transfers Goal: LTG Patient will perform toilet transfers w/assist (OT) Description: LTG: Patient will perform toilet transfers with assist, with/without cues using equipment (OT) Flowsheets (Taken 12/08/2019 1252) LTG: Pt will perform toilet transfers with assistance level of: Supervision/Verbal cueing   Problem: RH Tub/Shower Transfers Goal: LTG Patient will perform tub/shower transfers w/assist (OT) Description: LTG: Patient will perform tub/shower transfers with assist, with/without cues using equipment (OT) Flowsheets (Taken 12/08/2019 1252) LTG: Pt will perform tub/shower stall transfers with assistance level of: Supervision/Verbal cueing

## 2019-12-08 NOTE — Evaluation (Signed)
Physical Therapy Assessment and Plan  Patient Details  Name: COLONEL KRAUSER MRN: [id number] Date of Birth: [birth year]  PT Diagnosis: Difficulty walking and Muscle weakness Rehab Potential: Good ELOS: 7-10 Days   Today's Date: 12/08/2019 PT Individual Time: 0803-0900 and 7654-6503 PT Individual Time Calculation (min): 57 min  And 26 min  Hospital Problem: Principal Problem:   Debility   Past Medical History:  Past Medical History:  Diagnosis Date  . Aortic stenosis   . Coronary atherosclerosis of native coronary artery    Reportedly 2 stents - presumably LAD (records not available)  . CVD (cerebrovascular disease)   . Essential hypertension   . GERD (gastroesophageal reflux disease)   . Hyperlipidemia   . MI (myocardial infarction) [facility])    October 1999  . Type 2 diabetes mellitus with diabetic neuropathy [facility])    Past Surgical History:  Past Surgical History:  Procedure Laterality Date  . AORTIC VALVE REPLACEMENT N/A 11/22/2019   Procedure: AORTIC VALVE REPLACEMENT (AVR) USING INSPIRIS 23MM;  Surgeon: [name], MD;  Location: [place];  Service: Open Heart Surgery;  Laterality: N/A;  . Arch cerebral ateriogram  12/11/2005   Rosetta Posner MD  . CATARACT EXTRACTION, BILATERAL    . CORONARY ARTERY BYPASS GRAFT N/A 11/22/2019   Procedure: CORONARY ARTERY BYPASS GRAFTING (CABG), ON PUMP, TIMES FOUR , USING LEFT INTERNAL MAMMARY ARTERY AND ENDOSCOPICALLY HARVESTED RIGHT GREATER SAPHENOUS VEIN;  Surgeon: [name], MD;  Location: [place];  Service: Open Heart Surgery;  Laterality: N/A;  SEQUENTIAL RAMUS INTERMEDIATE TO OM1 SAPHENOUS VEIN TO DISTAL PDA LIMA TO LAD  . INGUINAL HERNIA REPAIR Left 12/09/2012   Procedure: HERNIA REPAIR INGUINAL with mesh;  Surgeon: [name], MD;  Location: [facility];  Service: General;  Laterality: Left;  . INSERTION OF MESH Left 12/09/2012   Procedure: INSERTION OF MESH;  Surgeon: [name], MD;  Location: [facility];  Service: General;   Laterality: Left;  . RIGHT/LEFT HEART CATH AND CORONARY ANGIOGRAPHY N/A 10/07/2017   Procedure: RIGHT/LEFT HEART CATH AND CORONARY ANGIOGRAPHY;  Surgeon: [name], MD;  Location: [place] CV LAB;  Service: Cardiovascular;  Laterality: N/A;  . RIGHT/LEFT HEART CATH AND CORONARY ANGIOGRAPHY N/A 11/16/2019   Procedure: RIGHT/LEFT HEART CATH AND CORONARY ANGIOGRAPHY;  Surgeon: Lorretta Harp, MD;  Location: [place] [facility];  Service: Cardiovascular;  Laterality: N/A;  . TEE WITHOUT CARDIOVERSION N/A 11/22/2019   Procedure: TRANSESOPHAGEAL ECHOCARDIOGRAM (TEE);  Surgeon: [name], MD;  Location: [place];  Service: Open Heart Surgery;  Laterality: N/A;    Assessment & Plan Clinical Impression: Patient is a 86 year old right-handed male with history of severe calcified aortic stenosis, chronic exertional angina, multivessel CAD with moderate diffuse disease and patent LAD stent since his last cardiac catheterization 2019 as well as history of hypertension, hyperlipidemia, obesity with BMI 29.28 and type 2 diabetes mellitus.  Per chart review patient lives with spouse.  Two-level home bed and bath on main level 6 steps to entry.  Independent with assistive device prior to admission.  He has a son who lives next door.  Presented 11/15/2019 with exertional chest pain.  Patient has been followed by Dr. Domenic Polite of cardiology services as an outpatient.  Chest x-ray with no active disease.  EKG with normal sinus rhythm with first-degree AV block with ST segment depressions in lateral and inferior leads that showed change from prior tracings 09/13/2019.  Troponin 4003.  He was loaded with Plavix.  Underwent cardiac catheterization showed 80% left main obstruction, proximal LAD obstruction of 80 to 90%, 70% obstruction of the first obtuse marginal and 90% obstruction of the intermediate and 75% mid right obstruction.  Patient underwent aortic valve replacement with CABG x4 11/22/2019 per Dr. Servando Snare.   Postoperative pain management with sternal precautions.  He was slowly extubated.  Post cardiotomy shock he was on milrinone for a short time.  Follow-up echocardiogram 11/30/2019 showed ejection fraction of 63% grade 2 diastolic dysfunction.  Patient currently maintained on full-strength aspirin 325 mg daily.  Subcutaneous Lovenox for DVT prophylaxis.  He is tolerating mechanical soft diet.  Acute blood loss anemia 8.8 and monitored.  He did have some AKI 1.67-1.46 and monitored. Patient transferred to CIR on 12/07/2019 .   Patient currently requires min with mobility secondary to muscle weakness, decreased cardiorespiratoy endurance and decreased sitting balance, decreased standing balance and decreased balance strategies.  Prior to hospitalization, patient was modified independent  with mobility and lived with   in a   home.  Home access is   .  Patient will benefit from skilled PT intervention to maximize safe functional mobility, minimize fall risk and decrease caregiver burden for planned discharge home with 24 hour supervision.  Anticipate patient will benefit from follow up OP at discharge.  PT - End of Session Activity Tolerance: Tolerates 30+ min activity with multiple rests Endurance Deficit: Yes Endurance Deficit Description: requires multiple rest breaks during mobility PT Assessment Rehab Potential (ACUTE/IP ONLY): Good PT Barriers to Discharge: Home environment access/layout PT Patient demonstrates impairments in the following area(s): Balance;Endurance;Motor;Pain;Sensory PT Transfers Functional Problem(s): Bed Mobility;Bed to Chair;Car PT Locomotion Functional Problem(s): Ambulation;Stairs PT Plan PT Intensity: Minimum of 1-2 x/day ,45 to 90 minutes PT Frequency: 5 out of 7 days PT Duration Estimated Length of Stay: 7-10 Days PT Treatment/Interventions: Ambulation/gait training;Community reintegration;DME/adaptive equipment instruction;Neuromuscular re-education;Psychosocial  support;Stair training;UE/LE Strength taining/ROM;Training and development officer;Wheelchair propulsion/positioning;UE/LE Coordination activities;Therapeutic Activities;Pain management;Functional electrical stimulation;Discharge planning;Skin care/wound management;Cognitive remediation/compensation;Disease management/prevention;Functional mobility training;Patient/family education;Therapeutic Exercise;Visual/perceptual remediation/compensation PT Transfers Anticipated Outcome(s): Supervision PT Locomotion Anticipated Outcome(s): Supervision PT Recommendation Follow Up Recommendations: Outpatient PT Patient destination: Home Equipment Recommended: To be determined  Skilled Therapeutic Intervention  Evaluation completed (see details above and below) with education on PT POC and goals and individual treatment initiated with focus on bed mobility, balance, transfers, ambulation, and stair training.  Pt received supine in bed and agreeable to therapy. No complaint of pain. PT provides verbal cues for logrolling technique to maintain sternal precautions and manual facilitation at trunk for supine to sit. MinA for sit to stand with cuing for anterior weight shift and body mechanics. Stand pivot to [facility] Willers with minA guidance at hips for safety. Pt ambulates 133' with RW and CGA with cues for upright gaze to improve posture and balance and monitoring level of exertion to increase safety awareness. Pt performs x4 steps with BHRs and minA with cues for sequencing. Pt left seated in WC with alarm intact and all needs within reach.  2nd Session: Pt received supine in bed and agreeable to therapy. No complaint of pain. Supine to sit with minA. Stand pivot transfer to [facility] with CGA and cues on body mechanics. WC transport to therapy gym for time management. Pt performs ramp navigation with CGA and RW and verbal cues for safe RW managmeent, then performs car transfer with minA and verbal cues for sequencing and positioning for  safety. Pt ambulates to fatigue 175' with RW and CGA with cues for upright gaze  to improve posture and balance. Stand step transfer back to bed and return to supine with minA management of BLEs. Pt left supine in bed with all needs within reach.   PT Evaluation Precautions/Restrictions Precautions Precautions: Fall;Sternal Precaution Comments: educated in sternal precautions Restrictions Weight Bearing Restrictions: No Pain Pain Assessment Pain Scale: 0-10 Pain Score: 0-No pain Home Living/Prior Functioning Home Living Available Help at Discharge: Family;Available 24 hours/day Type of Home: House Home Access: Stairs to enter [employer] of Steps: 6 Entrance Stairs-Rails: Right;Left Home Layout: Two level;Able to live on main level with bedroom/bathroom  Lives With: Spouse Prior Function Level of Independence: Independent with transfers;Independent with homemaking with ambulation;Requires assistive device for independence  Able to Take Stairs?: Yes Comments: pt pulls up on sink and door to stand from toilet, stands to shower, mows his grass Vision/Perception  Perception Perception: Within Functional Limits Praxis Praxis: Intact  Cognition Overall Cognitive Status: Within Functional Limits for tasks assessed Arousal/Alertness: Awake/alert Orientation Level: Oriented X4 Safety/Judgment: Appears intact Sensation Sensation Light Touch: Impaired by gross assessment Additional Comments: Light touch intact but dimished in BLEs. Baseline neuropathy. Coordination Gross Motor Movements are Fluid and Coordinated: Yes Fine Motor Movements are Fluid and Coordinated: Yes Motor  Motor Motor: Within Functional Limits  Mobility Bed Mobility Bed Mobility: Supine to Sit;Sit to Supine;Rolling Left Rolling Left: Supervision/Verbal cueing Supine to Sit: Minimal Assistance - Patient > 75% Sit to Supine: Minimal Assistance - Patient > 75% Transfers Transfers: Sit to  Stand;Stand to Sit;Stand Pivot Transfers Sit to Stand: Minimal Assistance - Patient > 75% Stand to Sit: Minimal Assistance - Patient > 75% Stand Pivot Transfers: Minimal Assistance - Patient > 75% Stand Pivot Transfer Details: Verbal cues for technique;Verbal cues for sequencing Transfer (Assistive device): Rolling walker Locomotion  Gait Ambulation: Yes Gait Assistance: Contact Guard/Touching assist Gait Distance (Feet): 175 Feet Assistive device: Rolling walker Gait Gait: Yes Gait Pattern: Impaired Gait Pattern: Trunk flexed;Decreased step length - left;Decreased step length - right Gait velocity: decreased Stairs / Additional Locomotion Stairs: Yes Stairs Assistance: Minimal Assistance - Patient > 75% Stair Management Technique: Two rails Number of Stairs: 4 Height of Stairs: 6 Ramp: Contact Guard/touching assist Curb: Minimal Assistance - Patient >75% Wheelchair Mobility Wheelchair Mobility: No  Trunk/Postural Assessment  Cervical Assessment Cervical Assessment: Exceptions to [facility] Thoracic Assessment Thoracic Assessment: Exceptions to [facility] Lumbar Assessment Lumbar Assessment: Exceptions to [facility] Postural Control Postural Control: Within Functional Limits  Balance Balance Balance Assessed: Yes Static Sitting Balance Static Sitting - Level of Assistance: 5: Stand by assistance Dynamic Sitting Balance Dynamic Sitting - Level of Assistance: 5: Stand by assistance Sitting balance - Comments: dynamic sitting for donning LB clothing Static Standing Balance Static Standing - Level of Assistance: 5: Stand by assistance Dynamic Standing Balance Dynamic Standing - Level of Assistance: 4: Min assist Extremity Assessment  RLE Assessment RLE Assessment: Exceptions to [facility] General Strength Comments: Grossly 5/5, but impaired endurance LLE Assessment LLE Assessment: Exceptions to [facility] General Strength Comments: Grossly 5/5, but impaired endurance    Refer to Care Plan for Long  Term Goals  Recommendations for other services: None   Discharge Criteria: Patient will be discharged from PT if patient refuses treatment 3 consecutive times without medical reason, if treatment goals not met, if there is a change in medical status, if patient makes no progress towards goals or if patient is discharged from hospital.  The above assessment, treatment plan, treatment alternatives and goals were discussed and mutually agreed upon: by patient  Breck Coons, PT, DPT 12/08/2019, 4:00 PM

## 2019-12-08 NOTE — Progress Notes (Signed)
[place] PHYSICAL MEDICINE & REHABILITATION PROGRESS NOTE   Subjective/Complaints: Mr. Korff has no complaints this morning.  Hgb improved. K+ low- will supplement Ca low- will supplement   Objective:   No results found. Recent Labs    12/07/19 0450 12/08/19 0529  WBC 7.7 6.2  HGB 8.8* 9.0*  HCT 27.6* 27.8*  PLT 271 258   Recent Labs    12/07/19 0450 12/08/19 0529  NA 137 136  K 3.7 3.3*  CL 98 97*  CO2 27 27  GLUCOSE 99 96  BUN 36* 32*  CREATININE 1.46* 1.47*  CALCIUM 8.4* 8.4*    Intake/Output Summary (Last 24 hours) at 12/08/2019 1312 Last data filed at 12/08/2019 0737 Gross per 24 hour  Intake 260 ml  Output 1500 ml  Net -1240 ml     Physical Exam: Vital Signs Blood pressure (!) 99/59, pulse 76, temperature 98 F (36.7 C), resp. rate 16, height 5\' 11"  (1.803 m), weight 94.5 kg, SpO2 92 %. General: Alert and oriented x 3, No apparent distress HEENT: Head is normocephalic, atraumatic, PERRLA, EOMI, sclera anicteric, oral mucosa pink and moist, dentition intact, very hard of hearing.  Neck: Supple without JVD or lymphadenopathy Heart: Reg rate and rhythm. No murmurs rubs or gallops Chest: CTA bilaterally without wheezes, rales, or rhonchi; no distress Abdomen: Soft, non-tender, non-distended, bowel sounds positive. Extremities: No clubbing, cyanosis, or edema. Pulses are 2+ Skin: Midline chest incision clean and dry.  Patient does have bilateral Unna boots in place.  Neuro: Patient is alert sitting up in chair watching television.  Makes good eye contact with examiner.  Follows full commands.  Oriented x3.  [place].  Psych: Pt's affect is appropriate. Pt is cooperative  Assessment/Plan: 1. Functional deficits secondary to cardiac debility which require 3+ hours per day of interdisciplinary therapy in a comprehensive inpatient rehab setting.  Physiatrist is providing close team supervision and 24 hour management of active medical problems  listed below.  Physiatrist and rehab team continue to assess barriers to discharge/monitor patient progress toward functional and medical goals  Care Tool:  Bathing    Body parts bathed by patient: Right arm, Left arm, Chest, Abdomen, Front perineal area, Right upper leg, Left upper leg, Face   Body parts bathed by helper: Buttocks     Bathing assist Assist Level: Moderate Assistance - Patient 50 - 74%     Upper Body Dressing/Undressing Upper body dressing   What is the patient wearing?: Pull over shirt    Upper body assist Assist Level: Contact Guard/Touching assist    Lower Body Dressing/Undressing Lower body dressing      What is the patient wearing?: Underwear/pull up     Lower body assist Assist for lower body dressing: Moderate Assistance - Patient 50 - 74%     Toileting Toileting    Toileting assist Assist for toileting: Moderate Assistance - Patient 50 - 74%     Transfers Chair/bed transfer  Transfers assist           Locomotion Ambulation   Ambulation assist              Walk 10 feet activity   Assist           Walk 50 feet activity   Assist           Walk 150 feet activity   Assist           Walk 10 feet on uneven surface  activity  Assist           Wheelchair     Assist               Wheelchair 50 feet with 2 turns activity    Assist            Wheelchair 150 feet activity     Assist          Blood pressure (!) 99/59, pulse 76, temperature 98 F (36.7 C), resp. rate 16, height 5\' 11"  (1.803 m), weight 94.5 kg, SpO2 92 %.  Medical Problem List and Plan: 1.  Debility secondary to severe multivessel CAD plus critical AS.  Status post CABG x4 with bioprosthetic AVR 11/22/2019.  Sternal precautions.             -patient may shower but midline incision must be covered             -ELOS/Goals: 7-10 days  -Initial CIR evaluations today.  2.   Antithrombotics: -DVT/anticoagulation: Lovenox             -antiplatelet therapy: Aspirin 325 mg daily 3. Pain Management: Oxycodone as needed- not using.  4. Mood: Provide emotional support             -antipsychotic agents: N/A 5. Neuropsych: This patient is capable of making decisions on his own behalf. 6. Skin/Wound Care: Routine skin checks.  Patient does have bilateral Unna boots in place. 7. Fluids/Electrolytes/Nutrition: Routine in and outs with follow-up chemistries.              7/6: Na and K stable. Cr downtrending. 8.  Acute blood loss anemia.  Follow-up CBCs weekly. Stable at 8.8 on 7/6.  9.  Post cardiotomy shock.  Continue ProAmatine as directed as well as Demadex.  Monitor for any signs of fluid overload 10.  Diabetes mellitus.  Hemoglobin A1c 7.4.  SSI.  Currently on Levemir 16 units daily.  Patient on Glucotrol 5 mg daily prior to admission and resume as needed in place of insulin.              7/6: CBGs dropped to 40s this afternoon. Decrease Levemir to 15U.  11.  Hyperlipidemia.  Crestor 12.  Hypothyroidism.  Synthroid 13.  Constipation.  Continue Colace and Dulcolax tablet as directed. 14. Hypocalcemia: Supplement started.  15. Hypokalemia: 56meq BID today. Repeat BMP tomorrow.      LOS: 1 days A FACE TO FACE EVALUATION WAS PERFORMED  Brenlynn Fake P Aleysha Meckler 12/08/2019, 1:12 PM

## 2019-12-08 NOTE — Progress Notes (Signed)
Inpatient Rehabilitation  Patient information reviewed and entered into eRehab system by Carnetta Losada M. Bacilio Abascal, M.A., CCC/SLP, PPS Coordinator.  Information including medical coding, functional ability and quality indicators will be reviewed and updated through discharge.    

## 2019-12-08 NOTE — Evaluation (Signed)
Occupational Therapy Assessment and Plan  Patient Details  Name: William Walker MRN: [id number] Date of Birth: 08-Apr-1927  OT Diagnosis: abnormal posture and muscle weakness (generalized) Rehab Potential:   ELOS: 7-10 days   Today's Date: 12/08/2019 OT Individual Time: 1015-1130 and 1456-1530 OT Individual Time Calculation (min): 75 min  and 79mn  Hospital Problem: Principal Problem:   Debility   Past Medical History:  Past Medical History:  Diagnosis Date  . Aortic stenosis   . Coronary atherosclerosis of native coronary artery    Reportedly 2 stents - presumably LAD (records not available)  . CVD (cerebrovascular disease)   . Essential hypertension   . GERD (gastroesophageal reflux disease)   . Hyperlipidemia   . MI (myocardial infarction) ([facility]    October 1999  . Type 2 diabetes mellitus with diabetic neuropathy ([facility]    Past Surgical History:  Past Surgical History:  Procedure Laterality Date  . AORTIC VALVE REPLACEMENT N/A 11/22/2019   Procedure: AORTIC VALVE REPLACEMENT (AVR) USING INSPIRIS 23MM;  Surgeon: William Isaac MD;  [place]  Service: Open Heart Surgery;  Laterality: N/A;  . Arch cerebral ateriogram  12/11/2005   TRosetta PosnerMD  . CATARACT EXTRACTION, BILATERAL    . CORONARY ARTERY BYPASS GRAFT N/A 11/22/2019   Procedure: CORONARY ARTERY BYPASS GRAFTING (CABG), ON PUMP, TIMES FOUR , USING LEFT INTERNAL MAMMARY ARTERY AND ENDOSCOPICALLY HARVESTED RIGHT GREATER SAPHENOUS VEIN;  Surgeon: William Isaac MD;  [place]  Service: Open Heart Surgery;  Laterality: N/A;  SEQUENTIAL RAMUS INTERMEDIATE TO OM1 SAPHENOUS VEIN TO DISTAL PDA LIMA TO LAD  . INGUINAL HERNIA REPAIR Left 12/09/2012   Procedure: HERNIA REPAIR INGUINAL with mesh;  Surgeon: MJamesetta So MD;  Location: [facility];  Service: General;  Laterality: Left;  . INSERTION OF MESH Left 12/09/2012   Procedure: INSERTION OF MESH;  Surgeon: MJamesetta So MD;  Location: [facility];  Service:  General;  Laterality: Left;  . RIGHT/LEFT HEART CATH AND CORONARY ANGIOGRAPHY N/A 10/07/2017   Procedure: RIGHT/LEFT HEART CATH AND CORONARY ANGIOGRAPHY;  Surgeon: CSherren Mocha MD;  [place];  Service: Cardiovascular;  Laterality: N/A;  . RIGHT/LEFT HEART CATH AND CORONARY ANGIOGRAPHY N/A 11/16/2019   Procedure: RIGHT/LEFT HEART CATH AND CORONARY ANGIOGRAPHY;  Surgeon: BLorretta Harp MD;  [place];  Service: Cardiovascular;  Laterality: N/A;  . TEE WITHOUT CARDIOVERSION N/A 11/22/2019   Procedure: TRANSESOPHAGEAL ECHOCARDIOGRAM (TEE);  Surgeon: William Isaac MD;  [place]  Service: Open Heart Surgery;  Laterality: N/A;    Assessment & Plan Clinical Impression: William Rathkeis a 86year old right-handed male with history of severe calcified aortic stenosis, chronic exertional angina, multivessel CAD with moderate diffuse disease and patent LAD stent since his last cardiac catheterization 2019 as well as history of hypertension, hyperlipidemia, obesity with BMI 29.28 and type 2 diabetes mellitus.  Per chart review patient lives with spouse.  Two-level home bed and bath on main level 6 steps to entry.  Independent with assistive device prior to admission.  He has a son who lives next door.  Presented 11/15/2019 with exertional chest pain.  Patient has been followed by Dr. MDomenic Politeof cardiology services as an outpatient.  Chest x-ray with no active disease.  EKG with normal sinus rhythm with first-degree AV block with ST segment depressions in lateral and inferior leads that showed change from prior tracings 09/13/2019.  Troponin 4003.  He was loaded  with Plavix.  Underwent cardiac catheterization showed 80% left main obstruction, proximal LAD obstruction of 80 to 90%, 70% obstruction of the first obtuse marginal and 90% obstruction of the intermediate and 75% mid right obstruction.  Patient underwent aortic valve replacement with CABG x4 11/22/2019 per  William Walker.  Postoperative pain management with sternal precautions.  He was slowly extubated.  Post cardiotomy shock he was on milrinone for a short time.  Follow-up echocardiogram 11/30/2019 showed ejection fraction of 65% grade 2 diastolic dysfunction.  Patient currently maintained on full-strength aspirin 325 mg daily.  Subcutaneous Lovenox for DVT prophylaxis.  He is tolerating mechanical soft diet.  Acute blood loss anemia 8.8 and monitored.  He did have some AKI 1.67-1.46 and monitored.  Therapy evaluations completed and patient was admitted for a comprehensive rehab program  Patient currently requires min- mod with basic self-care skills secondary to muscle weakness, decreased cardiorespiratoy endurance and decreased sitting balance, decreased standing balance, decreased postural control, decreased balance strategies and difficulty maintaining precautions.  Prior to hospitalization, patient could complete BADLs and some leisure activities with modified independent .  Patient will benefit from skilled intervention to increase independence with basic self-care skills prior to discharge home with care partner.  Anticipate patient will require intermittent supervision and follow up home health.  OT - End of Session Activity Tolerance: Tolerates 10 - 20 min activity with multiple rests Endurance Deficit: Yes Endurance Deficit Description: requires multiple rest breaks during ADL routine OT Assessment OT Patient demonstrates impairments in the following area(s): Balance;Endurance;Motor OT Basic ADL's Functional Problem(s): Bathing;Dressing;Toileting OT Transfers Functional Problem(s): Toilet;Tub/Shower OT Plan OT Intensity: Minimum of 1-2 x/day, 45 to 90 minutes OT Frequency: 5 out of 7 days OT Duration/Estimated Length of Stay: 7-10 days OT Treatment/Interventions: Balance/vestibular training;Discharge planning;Self Care/advanced ADL retraining;Therapeutic Activities;UE/LE Coordination  activities;Visual/perceptual remediation/compensation;Therapeutic Exercise;Patient/family education;Functional mobility training;Disease mangement/prevention;DME/adaptive equipment instruction;Neuromuscular re-education;Community reintegration;Psychosocial support;UE/LE Strength taining/ROM;Wheelchair propulsion/positioning OT Self Feeding Anticipated Outcome(s): Independent OT Basic Self-Care Anticipated Outcome(s): Mod I - Supervision OT Toileting Anticipated Outcome(s): Mod I - Supervision OT Bathroom Transfers Anticipated Outcome(s): Mod I - Supervision OT Recommendation Patient destination: Home Follow Up Recommendations: Home health OT Equipment Recommended: To be determined   Skilled Therapeutic Intervention Pt greeted at time of session sitting up in wheelchair agreeable to OT eval. Discussed OT POC, goals, and purpose of OT. Pt brought via wheelchair to bathroom and performed sit to stand from wheelchair with Mod A with crossing arms for sternal precautions but one standing pt was Min for transfer to/from standard toilet. Raised toilet seat later put over toilet for raised height with arms lowered. Pt was Min for toileting, requiring assistance for thoroughness with hygiene. UB/LB bathing at toilet level with supervision for UB and Min for LB. UB dressing CGA for precautions and LB dressing Mod A with figure four technique, assistance to thread foley and don partially over hips. Stand pivot back to wheelchair with min/mod to stand from toilet and min for transfer. Pt educated on techniques to maintain precautions throughout all self care tasks to promote healing. Discussed with pt regarding ELOS and OT POC to increase independence with self care. Pt very agreeable and pleasant. Pt up in wheelchair with alarm on, call bell in reach, all needs met.   Session 2: Pt greeted at time of session supine in bed but agreeable to OT session with no c/o pain throughout. Supine to sit CGA and ambulated  short distance from bed to sink level with CGA, able to static  stand with unilateral support and good BOS to perform denture care with supervision for standing balance but otherwise set up. Pt now has Foley removed and performed toileting for urine only in standing with unilateral support on sink and one to hold his red bucket that he uses for urinating. Supervision for hygiene in standing for cleanliness. Ambulated back to bed CGA with RW and sit to supine Min-CGA with cues for sternal precautions. Pt in bed with alarm on, call bell in reach, all needs met.    OT Evaluation Precautions/Restrictions  Precautions Precautions: Fall;Sternal Precaution Comments: educated in sternal precautions Restrictions Weight Bearing Restrictions: No Vital Signs Therapy Vitals Temp Source: Oral Pain Pain Assessment Pain Scale: 0-10 Pain Score: 0-No pain Home Living/Prior Functioning Home Living Family/patient expects to be discharged to:: Private residence Living Arrangements: Spouse/significant other Available Help at Discharge: Family, Available 24 hours/day Type of Home: House Home Access: Stairs to enter Technical brewer of Steps: 6 Entrance Stairs-Rails: Right, Left Home Layout: Two level, Able to live on main level with bedroom/bathroom Bathroom Shower/Tub: Chiropodist: Handicapped height  Lives With: Spouse IADL History Homemaking Responsibilities: Yes Homemaking Comments: Immunologist, yardwork Prior Function Level of Independence: Independent with transfers, Independent with homemaking with ambulation, Requires assistive device for independence  Able to Take Stairs?: Yes Comments: pt pulls up on sink and door to stand from toilet, stands to shower, mows his grass ADL ADL Grooming: Supervision/safety Where Assessed-Grooming: Sitting at sink Upper Body Bathing: Contact guard Where Assessed-Upper Body Bathing: Other (Comment) (sitting on toilet) Where  Assessed-Lower Body Bathing: Other (Comment) Upper Body Dressing: Contact guard Where Assessed-Upper Body Dressing: Other (Comment) (sitting in toilet) Lower Body Dressing: Moderate assistance Where Assessed-Lower Body Dressing: Other (Comment) (sitting on toilet, sit to stand to don over hips) Toileting: Moderate assistance Where Assessed-Toileting: Glass blower/designer: Moderate assistance (mod sit to stand, Min for stand pivot) Toilet Transfer Method: Stand pivot Vision Baseline Vision/History: Wears glasses Patient Visual Report: No change from baseline Perception  Perception: Within Functional Limits Praxis Praxis: Intact Cognition Overall Cognitive Status: Within Functional Limits for tasks assessed Arousal/Alertness: Awake/alert Orientation Level: Person;Place;Situation Person: Oriented Place: Oriented Situation: Oriented Year: 2021 Month: July Day of Week: Correct Memory: Appears intact Immediate Memory Recall: Sock;Blue;Bed Memory Recall Sock: Without Cue Memory Recall Blue: Without Cue Memory Recall Bed: Without Cue Attention: Focused;Sustained Safety/Judgment: Appears intact Sensation Sensation Light Touch: Impaired by gross assessment Additional Comments: Light touch intact but dimished in BLEs. Baseline neuropathy. Coordination Gross Motor Movements are Fluid and Coordinated: Yes Fine Motor Movements are Fluid and Coordinated: Yes Motor  Motor Motor: Within Functional Limits Mobility  Bed Mobility Bed Mobility: Supine to Sit;Sit to Supine;Rolling Left Rolling Left: Supervision/Verbal cueing Supine to Sit: Minimal Assistance - Patient > 75% Sit to Supine: Minimal Assistance - Patient > 75% Transfers Sit to Stand: Moderate Assistance - Patient 50-74% (Min-Mod from wheelchair) Stand to Sit: Minimal Assistance - Patient > 75%  Trunk/Postural Assessment  Cervical Assessment Cervical Assessment: Exceptions to [facility] Thoracic Assessment Thoracic  Assessment: Exceptions to [facility] Lumbar Assessment Lumbar Assessment: Exceptions to [facility] Postural Control Postural Control: Within Functional Limits  Balance Balance Balance Assessed: Yes Static Sitting Balance Static Sitting - Level of Assistance: 5: Stand by assistance Dynamic Sitting Balance Dynamic Sitting - Level of Assistance: 5: Stand by assistance Sitting balance - Comments: dynamic sitting for donning LB clothing Static Standing Balance Static Standing - Level of Assistance: 5: Stand by assistance Dynamic Standing Balance Dynamic Standing -  Level of Assistance: 4: Min assist Extremity/Trunk Assessment RUE Assessment RUE Assessment: Within Functional Limits General Strength Comments: generalized weakness, ROM WFL LUE Assessment General Strength Comments: generalized weakness, ROM WFL     Refer to Care Plan for Long Term Goals  Recommendations for other services: None    Discharge Criteria: Patient will be discharged from OT if patient refuses treatment 3 consecutive times without medical reason, if treatment goals not met, if there is a change in medical status, if patient makes no progress towards goals or if patient is discharged from hospital.  The above assessment, treatment plan, treatment alternatives and goals were discussed and mutually agreed upon: by patient  [name] 12/08/2019, 12:55 PM

## 2019-12-09 ENCOUNTER — Inpatient Hospital Stay (HOSPITAL_COMMUNITY): Payer: Medicare Other

## 2019-12-09 ENCOUNTER — Inpatient Hospital Stay (HOSPITAL_COMMUNITY): Payer: Medicare Other | Admitting: Occupational Therapy

## 2019-12-09 ENCOUNTER — Ambulatory Visit: Payer: Medicare Other | Admitting: Student

## 2019-12-09 LAB — GLUCOSE, CAPILLARY
Glucose-Capillary: 95 mg/dL (ref 70–99)
Glucose-Capillary: 168 mg/dL — ABNORMAL HIGH (ref 70–99)
Glucose-Capillary: 140 mg/dL — ABNORMAL HIGH (ref 70–99)
Glucose-Capillary: 138 mg/dL — ABNORMAL HIGH (ref 70–99)

## 2019-12-09 LAB — BASIC METABOLIC PANEL
Anion gap: 11 (ref 5–15)
BUN: 31 mg/dL — ABNORMAL HIGH (ref 8–23)
CO2: 29 mmol/L (ref 22–32)
Calcium: 8.7 mg/dL — ABNORMAL LOW (ref 8.9–10.3)
Chloride: 99 mmol/L (ref 98–111)
Creatinine, Ser: 1.51 mg/dL — ABNORMAL HIGH (ref 0.61–1.24)
GFR calc Af Amer: 46 mL/min — ABNORMAL LOW (ref >60)
GFR calc non Af Amer: 40 mL/min — ABNORMAL LOW (ref >60)
Glucose, Bld: 85 mg/dL (ref 70–99)
Potassium: 3.5 mmol/L (ref 3.5–5.1)
Sodium: 139 mmol/L (ref 135–145)

## 2019-12-09 MED ORDER — BOOST / RESOURCE BREEZE PO LIQD CUSTOM
1.0000 | Freq: Two times a day (BID) | ORAL | Status: DC
  Administered 2019-12-09 – 2019-12-15 (×7): 1 via ORAL
  Filled 2019-12-09: qty 1

## 2019-12-09 MED ORDER — CHLORHEXIDINE GLUCONATE CLOTH 2 % EX PADS
6.0000 | MEDICATED_PAD | Freq: Every day | CUTANEOUS | Status: DC
  Administered 2019-12-09 – 2019-12-13 (×5): 6 via TOPICAL

## 2019-12-09 MED ORDER — ENSURE MAX PROTEIN PO LIQD
11.0000 [oz_av] | Freq: Every day | ORAL | Status: DC
  Administered 2019-12-09 – 2019-12-14 (×6): 11 [oz_av] via ORAL
  Filled 2019-12-09 (×7): qty 330

## 2019-12-09 MED ORDER — INSULIN ASPART 100 UNIT/ML ~~LOC~~ SOLN
0.0000 [IU] | Freq: Every day | SUBCUTANEOUS | Status: DC
  Administered 2019-12-14: 2 [IU] via SUBCUTANEOUS

## 2019-12-09 MED ORDER — JUVEN PO PACK
1.0000 | PACK | Freq: Two times a day (BID) | ORAL | Status: DC
  Administered 2019-12-09 – 2019-12-15 (×8): 1 via ORAL
  Filled 2019-12-09 (×9): qty 1

## 2019-12-09 MED ORDER — POTASSIUM CHLORIDE CRYS ER 20 MEQ PO TBCR
40.0000 meq | EXTENDED_RELEASE_TABLET | Freq: Three times a day (TID) | ORAL | Status: DC
  Administered 2019-12-09 – 2019-12-10 (×3): 40 meq via ORAL
  Filled 2019-12-09 (×3): qty 2

## 2019-12-09 MED ORDER — SODIUM CHLORIDE 0.9% FLUSH
10.0000 mL | INTRAVENOUS | Status: DC | PRN
  Administered 2019-12-10: 10 mL

## 2019-12-09 MED ORDER — INSULIN ASPART 100 UNIT/ML ~~LOC~~ SOLN
0.0000 [IU] | Freq: Three times a day (TID) | SUBCUTANEOUS | Status: DC
  Administered 2019-12-09 – 2019-12-10 (×2): 1 [IU] via SUBCUTANEOUS
  Administered 2019-12-10: 3 [IU] via SUBCUTANEOUS
  Administered 2019-12-11: 1 [IU] via SUBCUTANEOUS
  Administered 2019-12-11: 2 [IU] via SUBCUTANEOUS
  Administered 2019-12-12 (×2): 1 [IU] via SUBCUTANEOUS
  Administered 2019-12-13 (×2): 2 [IU] via SUBCUTANEOUS
  Administered 2019-12-13 – 2019-12-15 (×5): 1 [IU] via SUBCUTANEOUS

## 2019-12-09 MED ORDER — SODIUM CHLORIDE 0.9% FLUSH
10.0000 mL | Freq: Two times a day (BID) | INTRAVENOUS | Status: DC
  Administered 2019-12-10 – 2019-12-13 (×5): 10 mL

## 2019-12-09 NOTE — Progress Notes (Signed)
[place] PHYSICAL MEDICINE & REHABILITATION PROGRESS NOTE   Subjective/Complaints: Discussed the two orders for Torsemide with RN Marjorie Smolder and PA Reesa Chew- one dose is in morning and one dose is at night and we can continue with this.  Cr trending upward but overall improved.  ROS: Denies pain, constipation, insomnia   Objective:   No results found. Recent Labs    12/07/19 0450 12/08/19 0529  WBC 7.7 6.2  HGB 8.8* 9.0*  HCT 27.6* 27.8*  PLT 271 258   Recent Labs    12/08/19 0529 12/09/19 0523  NA 136 139  K 3.3* 3.5  CL 97* 99  CO2 27 29  GLUCOSE 96 85  BUN 32* 31*  CREATININE 1.47* 1.51*  CALCIUM 8.4* 8.7*    Intake/Output Summary (Last 24 hours) at 12/09/2019 1141 Last data filed at 12/09/2019 0833 Gross per 24 hour  Intake 960 ml  Output 853 ml  Net 107 ml     Physical Exam: Vital Signs Blood pressure (!) 108/55, pulse 74, temperature 97.7 F (36.5 C), resp. rate 16, height 5\' 11"  (1.803 m), weight 94 kg, SpO2 94 %. General: Alert and oriented x 3, No apparent distress HEENT: Head is normocephalic, atraumatic, PERRLA, EOMI, sclera anicteric, oral mucosa pink and moist, dentition intact, very hard of hearing.  Neck: Supple without JVD or lymphadenopathy Heart: Reg rate and rhythm. No murmurs rubs or gallops Chest: CTA bilaterally without wheezes, rales, or rhonchi; no distress Abdomen: Soft, non-tender, non-distended, bowel sounds positive. Extremities: 1+ pitting edema in bilateral lower extremities.  Skin: Midline chest incision clean and dry.  Patient does have bilateral Unna boots in place. PICC in place Neuro: Patient is alert sitting up in chair watching television.  Makes good eye contact with examiner.  Follows full commands.  Oriented x3.  [place].  Psych: Pt's affect is appropriate. Pt is cooperative  Assessment/Plan: 1. Functional deficits secondary to cardiac debility which require 3+ hours per day of interdisciplinary therapy  in a comprehensive inpatient rehab setting.  Physiatrist is providing close team supervision and 24 hour management of active medical problems listed below.  Physiatrist and rehab team continue to assess barriers to discharge/monitor patient progress toward functional and medical goals  Care Tool:  Bathing    Body parts bathed by patient: Right arm, Left arm, Chest, Abdomen, Front perineal area, Right upper leg, Left upper leg, Face   Body parts bathed by helper: Buttocks     Bathing assist Assist Level: Moderate Assistance - Patient 50 - 74%     Upper Body Dressing/Undressing Upper body dressing   What is the patient wearing?: Hospital gown only    Upper body assist Assist Level: Contact Guard/Touching assist    Lower Body Dressing/Undressing Lower body dressing      What is the patient wearing?: Incontinence brief     Lower body assist Assist for lower body dressing: Moderate Assistance - Patient 50 - 74%     Toileting Toileting    Toileting assist Assist for toileting: Minimal Assistance - Patient > 75%     Transfers Chair/bed transfer  Transfers assist     Chair/bed transfer assist level: Minimal Assistance - Patient > 75%     Locomotion Ambulation   Ambulation assist      Assist level: Contact Guard/Touching assist Assistive device: Walker-rolling Max distance: 175'   Walk 10 feet activity   Assist     Assist level: Contact Guard/Touching assist Assistive device: Walker-rolling  Walk 50 feet activity   Assist    Assist level: Contact Guard/Touching assist Assistive device: Walker-rolling    Walk 150 feet activity   Assist    Assist level: Contact Guard/Touching assist Assistive device: Walker-rolling    Walk 10 feet on uneven surface  activity   Assist     Assist level: Contact Guard/Touching assist Assistive device: Walker-rolling   Wheelchair     Assist Will patient use wheelchair at discharge?: No              Wheelchair 50 feet with 2 turns activity    Assist            Wheelchair 150 feet activity     Assist          Blood pressure (!) 108/55, pulse 74, temperature 97.7 F (36.5 C), resp. rate 16, height 5\' 11"  (1.803 m), weight 94 kg, SpO2 94 %.  Medical Problem List and Plan: 1.  Debility secondary to severe multivessel CAD plus critical AS.  Status post CABG x4 with bioprosthetic AVR 11/22/2019.  Sternal precautions.             -patient may shower but midline incision must be covered             -ELOS/Goals: 7-10 days  -Continue CIR 2.  Antithrombotics: -DVT/anticoagulation: Lovenox             -antiplatelet therapy: Aspirin 325 mg daily 3. Pain Management: Oxycodone as needed- not using, will discontinue.  4. Mood: Provide emotional support             -antipsychotic agents: N/A 5. Neuropsych: This patient is capable of making decisions on his own behalf. 6. Skin/Wound Care: Routine skin checks.  Patient does have bilateral Unna boots in place. 7. Fluids/Electrolytes/Nutrition: Routine in and outs with follow-up chemistries.              7/6: Na and K stable. Cr downtrending. 8.  Acute blood loss anemia.  Follow-up CBCs weekly. Stable at 8.8 on 7/6.  9.  Post cardiotomy shock.  Continue ProAmatine as directed as well as Demadex.  Monitor for any signs of fluid overload 10.  Diabetes mellitus.  Hemoglobin A1c 7.4.  SSI.  Currently on Levemir 16 units daily.  Patient on Glucotrol 5 mg daily prior to admission and resume as needed in place of insulin.              7/6: CBGs dropped to 40s this afternoon. Decrease Levemir to 15U.  11.  Hyperlipidemia.  Crestor 12.  Hypothyroidism.  Synthroid 13.  Constipation.  Continue Colace and Dulcolax tablet as directed. 14. Hypocalcemia: Supplement started.  15. Hypokalemia:   7/8: Continues to be low despite increase in supplementation- continue aggressive supplementation and repeat tomorrow.  16. CKD: Cr up to 1.51  but is overall improved from prior.      LOS: 2 days A FACE TO FACE EVALUATION WAS PERFORMED  Clide Deutscher [place] Droll 12/09/2019, 11:41 AM

## 2019-12-09 NOTE — Progress Notes (Signed)
Physical Therapy Session Note  Patient Details  Name: William Walker MRN: [id number] Date of Birth: January 30, 1927  Today's Date: 12/09/2019 PT Individual Time: 9628-3662 and 1305-1405 PT Individual Time Calculation (min): 69 min and 60 min Missed Time: 15 min (fatigue)  Short Term Goals: Week 1:  PT Short Term Goal 1 (Week 1): STGs = LTGs due to ELOS  Skilled Therapeutic Interventions/Progress Updates:     1st Session: Pt received supine in bed and agreeable therapy. Supine to sit with CGA/minA. Pt performs stand pivot transfer to [facility] with CGA and cues for positioning for safety.   Gait training for balance and endurance training. Pt ambulates 190', 210', 175' with extended seated rest breaks, with PT providing supervision for verbal cues to maintain upright gaze and decrease WB through RW for energy conservation.  Pt performs NMR for standing balance, without use of AD, with PT providing minA at hips for stability and pt practicing lateral weight shifts and attempting anterior/posterior weight shifts with cues to "lead with hips". Pt tends shift weight from shoulders rather than hips and has several slight LOBs while performing activity. Transitions to performing sidestepping in // bars with mirror for visual feedback. Initially pt uses BUE support and progresses to no UE support. Pt takes very short tentative steps with no UE support, so switches to single UE support with more stable, confident stepping.  Pt left in recliner with alarm intact and all needs within reach.  2nd Session: Pt received seated in recliner and agreeable to therapy. Reports no pain. Sit to stand with CGA and stand step transfer to [facility] with supervision and verbal cues for positioning. WC transport to gym for energy conservation. Pt transfers to mat table with CGA. Performs NMR in sitting for balance and activity tolerance. Pt uses 2 lb bar weight and performs targeted reaching to hit ball back to PT.   Pt ambulates to fatigue  210' with RW and close supervision. Verbal cues provided for increased stride length and upright gaze for improved balance.  Pt performs x10" on nustep for endurance training and reciprocal coordination training, taking several rest breaks due to BLE fatigue. Pt verbalizes fatigue and requests to return to room.  Pt ambulates 175' back to room with RW and stands to urinate in bucket. Is continent of urine. Pt left seated in recliner with alarm intact and all needs within reach.  Therapy Documentation Precautions:  Precautions Precautions: Fall, Sternal Precaution Comments: educated in sternal precautions Restrictions Weight Bearing Restrictions: No   Therapy/Group: Individual Therapy  Breck Coons, PT, DPT 12/09/2019, 3:58 PM

## 2019-12-09 NOTE — Progress Notes (Addendum)
Inpatient Diabetes Program Recommendations  AACE/ADA: New Consensus Statement on Inpatient Glycemic Control (2015)  Target Ranges:  Prepandial:   less than 140 mg/dL      Peak postprandial:   less than 180 mg/dL (1-2 hours)      Critically ill patients:  140 - 180 mg/dL   Lab Results  Component Value Date   GLUCAP 168 (H) 12/09/2019   HGBA1C 7.4 (H) 11/15/2019    Review of Glycemic Control  Diabetes history: type 2 Outpatient Diabetes medications: glucotrol 5 mg daily Current orders for Inpatient glycemic control: Levemir 15 units daily, Novolog 0-24 units TID & HS  Inpatient Diabetes Program Recommendations: Received diabetes coordinator consult in regards to daughter's concern about patient having hypoglycemia. Noted that patient is ordered  Novolog 0-24 units correction scale TID & HS along with Levemir 15 units daily.   Recommend changing Novolog correction scale to SENSITIVE (0-9 units) TID and adding Novolog 0-5 units HS scale. Watch trends and continue Levemir 15 units daily.      Addendum: spoke with Marlowe Shores PA about recommendations for insulin. Novolog 0-9 units TID & Novolog 0-5 units HS scale was ordered. Novolog 0-24 units TID & HS was discontinued.   Harvel Ricks RN BSN CDE Diabetes Coordinator Pager: [phone]  8am-5pm

## 2019-12-09 NOTE — Progress Notes (Signed)
Occupational Therapy Session Note  Patient Details  Name: William Walker MRN: [id number] Date of Birth: [birth year]  Today's Date: 12/09/2019 OT Individual Time: 1030-1119 OT Individual Time Calculation (min): 49 min    Short Term Goals: Week 1:  OT Short Term Goal 1 (Week 1): STGs=LTGs due to ELOS  Skilled Therapeutic Interventions/Progress Updates:    Pt greeted at time of session sitting up in recliner agreeable to OT session, no c/o pain throughout. Ambulated short distance throughout room with RW to sink level with CGA but sit to stand continues to be Min A, inconsistent with his timing and cues to adhere to sternal precautions. Sink level hygiene and grooming, shaving his face with electric razor with supervision throughout. UB bathing sitting in chair with ed on having a seat to sit on at home for fatigue, bathed with supervision with cues to maintain precautions. Donned new shirt with supervision and ambulated back to recliner with Supervision-CGA, performed toileting in standing using his bucket technique for home, assistance with brief only d/t pt did not know how to unstrap sides, CGA throughout standing. Pt sitting up in recliner with alarm on, call bell in reach, all needs met.   Therapy Documentation Precautions:  Precautions Precautions: Fall, Sternal Precaution Comments: educated in sternal precautions Restrictions Weight Bearing Restrictions: No     Therapy/Group: Individual Therapy  [name] 12/09/2019, 12:13 PM

## 2019-12-09 NOTE — Progress Notes (Signed)
PICC assessment: Discussed indication for keeping line with RN. Pt's blood sugars are variable. Plan to keep line for now. RN will enter consult for dressing change once pt is back in bed.

## 2019-12-09 NOTE — Care Management (Signed)
[facility] Individual Statement of Services  Patient Name:  BIENVENIDO PROEHL  Date:  12/09/2019  Welcome to the [facility].  Our goal is to provide you with an individualized program based on your diagnosis and situation, designed to meet your specific needs.  With this comprehensive rehabilitation program, you will be expected to participate in at least 3 hours of rehabilitation therapies Monday-Friday, with modified therapy programming on the weekends.  Your rehabilitation program will include the following services:  Physical Therapy (PT), Occupational Therapy (OT), 24 hour per day rehabilitation nursing, Therapeutic Recreaction (TR), Psychology, Neuropsychology, Care Coordinator, Rehabilitation Medicine, Nutrition Services, Pharmacy Services and Other  Weekly team conferences will be held on Tuesdays to discuss your progress.  Your Inpatient Rehabilitation Care Coordinator will talk with you frequently to get your input and to update you on team discussions.  Team conferences with you and your family in attendance may also be held.  Expected length of stay: 7-10 days    Overall anticipated outcome: Supervision  Depending on your progress and recovery, your program may change. Your Inpatient Rehabilitation Care Coordinator will coordinate services and will keep you informed of any changes. Your Inpatient Rehabilitation Care Coordinator's name and contact numbers are listed  below.  The following services may also be recommended but are not provided by the [facility] will be made to provide these services after discharge if needed.  Arrangements include referral to agencies that provide these services.  Your insurance has been verified to be:  [facility] Medicare  Your primary doctor is:  Collene Mares  Pertinent information will be shared with your doctor and your insurance company.  Inpatient Rehabilitation Care Coordinator:  Cathleen Corti [phone] or [phone]  Information discussed with and copy given to patient by: Rana Snare, 12/09/2019, 9:13 AM

## 2019-12-09 NOTE — Progress Notes (Signed)
Daughter concern about hypoglycemia re: insulin sliding scale. Linna Hoff PA notified verbal order for diabetic coordinator consult.

## 2019-12-10 ENCOUNTER — Inpatient Hospital Stay (HOSPITAL_COMMUNITY): Payer: Medicare Other | Admitting: Occupational Therapy

## 2019-12-10 ENCOUNTER — Inpatient Hospital Stay (HOSPITAL_COMMUNITY): Payer: Medicare Other

## 2019-12-10 LAB — BASIC METABOLIC PANEL
Anion gap: 9 (ref 5–15)
BUN: 34 mg/dL — ABNORMAL HIGH (ref 8–23)
CO2: 31 mmol/L (ref 22–32)
Calcium: 8.8 mg/dL — ABNORMAL LOW (ref 8.9–10.3)
Chloride: 100 mmol/L (ref 98–111)
Creatinine, Ser: 1.45 mg/dL — ABNORMAL HIGH (ref 0.61–1.24)
GFR calc Af Amer: 48 mL/min — ABNORMAL LOW (ref >60)
GFR calc non Af Amer: 42 mL/min — ABNORMAL LOW (ref >60)
Glucose, Bld: 103 mg/dL — ABNORMAL HIGH (ref 70–99)
Potassium: 3.9 mmol/L (ref 3.5–5.1)
Sodium: 140 mmol/L (ref 135–145)

## 2019-12-10 LAB — GLUCOSE, CAPILLARY
Glucose-Capillary: 106 mg/dL — ABNORMAL HIGH (ref 70–99)
Glucose-Capillary: 147 mg/dL — ABNORMAL HIGH (ref 70–99)
Glucose-Capillary: 237 mg/dL — ABNORMAL HIGH (ref 70–99)
Glucose-Capillary: 91 mg/dL (ref 70–99)

## 2019-12-10 LAB — MAGNESIUM: Magnesium: 2.2 mg/dL (ref 1.7–2.4)

## 2019-12-10 MED ORDER — POTASSIUM CHLORIDE CRYS ER 20 MEQ PO TBCR
40.0000 meq | EXTENDED_RELEASE_TABLET | Freq: Two times a day (BID) | ORAL | Status: DC
  Administered 2019-12-10 – 2019-12-16 (×12): 40 meq via ORAL
  Filled 2019-12-10 (×12): qty 2

## 2019-12-10 NOTE — Progress Notes (Signed)
Occupational Therapy Session Note  Patient Details  Name: William Walker MRN: [id number] Date of Birth: 16-Feb-1927  Today's Date: 12/10/2019 OT Individual Time: 1000-1100 OT Individual Time Calculation (min): 60 min    Short Term Goals: Week 1:  OT Short Term Goal 1 (Week 1): STGs=LTGs due to ELOS  Skilled Therapeutic Interventions/Progress Updates:    1:1 Pt received in the w/c. Pt reported his brief was wet from urgency incontinence. Pt ambulated to the bathroom with RW with close supervision and transferred into shower stall with min A without DME. Pt voided in standing holding his red bucket due to inverted penis/ hernia with contact guard. Pt was able to hold bucket while voiding. Pt washed sit to stand with min A with bilateral LEs covered due to una boots. Pt only wanted to don brief and t shirt due to frequency of voiding. Pt able to perform bed mobility with contact guard.    Pt with small skin tear at top of his bottom crack.  Left resting in the bed.  Therapy Documentation Precautions:  Precautions Precautions: Fall, Sternal Precaution Comments: educated in sternal precautions Restrictions Weight Bearing Restrictions: No Pain:  no c/o pain in session    Therapy/Group: Individual Therapy  Willeen Cass [facility] 12/10/2019, 2:32 PM

## 2019-12-10 NOTE — Progress Notes (Signed)
Physical Therapy Session Note  Patient Details  Name: William Walker MRN: [id number] Date of Birth: 1927/02/22  Today's Date: 12/10/2019 PT Individual Time: 1300-1358 and 1700-1726 PT Individual Time Calculation (min): 58 min and 26 min  Short Term Goals: Week 1:  PT Short Term Goal 1 (Week 1): STGs = LTGs due to ELOS  Skilled Therapeutic Interventions/Progress Updates:     1st Session: Pt received supine in bed and agreeable to therapy. No report of pain. Pt's daughter present for family education. Pt performs supine to sit with supervision and verbal cues for logrolling and sequencing. Stand pivot transfer to [facility] with RW and supervision for cues on positioning and body mechanics. WC transport to gym for energy conservation.   Pt ambulates 100' with RW and supervision. Cues to maintain upright gaze to improve posture and balance. Pt performs car transfer with cues to not grab door frame for safety. Daughter educated on safe positioning to guard pt.  Pt performs x8 steps with RHR and CGA at hips for stability. Verbal cues for correct sequencing of steps for safety. Pt appears slightly stronger with LLE so ascends with LLE first and descends with RLE first.   Pt performs stand step transfer to recliner without AD, ambulating 15' with min guard at hips for stability. Left seated in recliner with all needs within reach and alarm intact.  2nd Session: Pt receives seated in recliner. No report of pain. Stand step transfer to [facility] with RW and supervision for safety.  Pt performs NMR for standing balance, using putting activity to challenge dynamic standing balance as well as improving upright activity tolerance. PT provides minA at hips for stability and provides cues for optimal weight transitions. Pt has slight posterior lean thorough much of activity and requires several seated rest breaks.  Stand step transfer to recliner without AD and CGA at hips. Left seated in recliner with alarm intact and all  needs within reach.  Therapy Documentation Precautions:  Precautions Precautions: Fall, Sternal Precaution Comments: educated in sternal precautions Restrictions Weight Bearing Restrictions: No    Therapy/Group: Individual Therapy  Breck Coons, PT, DPT 12/10/2019, 5:39 PM

## 2019-12-10 NOTE — Progress Notes (Signed)
[facility] PHYSICAL MEDICINE & REHABILITATION PROGRESS NOTE   Subjective/Complaints: Slept well overnight. Currently without pain.  Incision is healing well. Last BM day before yesterday- this is typical for him.   ROS: Denies pain, constipation, insomnia   Objective:   No results found. Recent Labs    12/08/19 0529  WBC 6.2  HGB 9.0*  HCT 27.8*  PLT 258   Recent Labs    12/09/19 0523 12/10/19 0431  NA 139 140  K 3.5 3.9  CL 99 100  CO2 29 31  GLUCOSE 85 103*  BUN 31* 34*  CREATININE 1.51* 1.45*  CALCIUM 8.7* 8.8*    Intake/Output Summary (Last 24 hours) at 12/10/2019 1008 Last data filed at 12/09/2019 1821 Gross per 24 hour  Intake 480 ml  Output --  Net 480 ml     Physical Exam: Vital Signs Blood pressure (!) 108/49, pulse 73, temperature 98.2 F (36.8 C), temperature source Oral, resp. rate 16, height 5\' 11"  (1.803 m), weight 94 kg, SpO2 93 %. General: Alert and oriented x 3, No apparent distress HEENT: Head is normocephalic, atraumatic, PERRLA, EOMI, sclera anicteric, oral mucosa pink and moist, very hard of hearing. Poor dentition Neck: Supple without JVD or lymphadenopathy Heart: Reg rate and rhythm. No murmurs rubs or gallops Chest: CTA bilaterally without wheezes, rales, or rhonchi; no distress Abdomen: Soft, non-tender, non-distended, bowel sounds positive. Extremities: 1+ pitting edema in bilateral lower extremities. 2+ in right foot.  Skin: Midline chest incision clean and dry.  Patient does have bilateral Unna boots in place. PICC in place Neuro: Patient is alert sitting up in chair watching television.  Makes good eye contact with examiner.  Follows full commands.  Oriented x3.  [place].  Psych: Pt's affect is appropriate. Pt is cooperative   Assessment/Plan: 1. Functional deficits secondary to cardiac debility which require 3+ hours per day of interdisciplinary therapy in a comprehensive inpatient rehab setting.  Physiatrist is  providing close team supervision and 24 hour management of active medical problems listed below.  Physiatrist and rehab team continue to assess barriers to discharge/monitor patient progress toward functional and medical goals  Care Tool:  Bathing    Body parts bathed by patient: Right arm, Left arm, Chest, Abdomen, Face   Body parts bathed by helper: Buttocks     Bathing assist Assist Level: Minimal Assistance - Patient > 75%     Upper Body Dressing/Undressing Upper body dressing   What is the patient wearing?: Pull over shirt    Upper body assist Assist Level: Contact Guard/Touching assist    Lower Body Dressing/Undressing Lower body dressing      What is the patient wearing?: Incontinence brief     Lower body assist Assist for lower body dressing: Moderate Assistance - Patient 50 - 74%     Toileting Toileting    Toileting assist Assist for toileting: Minimal Assistance - Patient > 75%     Transfers Chair/bed transfer  Transfers assist     Chair/bed transfer assist level: Contact Guard/Touching assist     Locomotion Ambulation   Ambulation assist      Assist level: Contact Guard/Touching assist Assistive device: Walker-rolling Max distance: 210'   Walk 10 feet activity   Assist     Assist level: Contact Guard/Touching assist Assistive device: Walker-rolling   Walk 50 feet activity   Assist    Assist level: Contact Guard/Touching assist Assistive device: Walker-rolling    Walk 150 feet activity   Assist  Assist level: Contact Guard/Touching assist Assistive device: Walker-rolling    Walk 10 feet on uneven surface  activity   Assist     Assist level: Contact Guard/Touching assist Assistive device: Aeronautical engineer Will patient use wheelchair at discharge?: No             Wheelchair 50 feet with 2 turns activity    Assist            Wheelchair 150 feet activity      Assist          Blood pressure (!) 108/49, pulse 73, temperature 98.2 F (36.8 C), temperature source Oral, resp. rate 16, height 5\' 11"  (1.803 m), weight 94 kg, SpO2 93 %.  Medical Problem List and Plan: 1.  Debility secondary to severe multivessel CAD plus critical AS.  Status post CABG x4 with bioprosthetic AVR 11/22/2019.  Sternal precautions.             -patient may shower but midline incision must be covered             -ELOS/Goals: 7-10 days  -Continue CIR 2.  Antithrombotics: -DVT/anticoagulation: Lovenox             -antiplatelet therapy: Aspirin 325 mg daily 3. Pain Management: Oxycodone as needed- not using, discontinued 4. Mood: Provide emotional support             -antipsychotic agents: N/A 5. Neuropsych: This patient is capable of making decisions on his own behalf. 6. Skin/Wound Care: Routine skin checks.  Patient does have bilateral Unna boots in place. 7. Fluids/Electrolytes/Nutrition: Routine in and outs with follow-up chemistries.              7/9: Na and K stable. Cr downtrending 8.  Acute blood loss anemia.  Follow-up CBCs weekly. Stable at 8.8 on 7/6.  9.  Post cardiotomy shock.  Continue ProAmatine as directed as well as Demadex.  Monitor for any signs of fluid overload 10.  Diabetes mellitus.  Hemoglobin A1c 7.4.  SSI.  Currently on Levemir 16 units daily.  Patient on Glucotrol 5 mg daily prior to admission and resume as needed in place of insulin.              7/6: CBGs dropped to 40s this afternoon. Decrease Levemir to 15U.   7/9: Changed to Novolog sensitive scale.  11.  Hyperlipidemia.  Crestor 12.  Hypothyroidism.  Synthroid 13.  Constipation.  Continue Colace and Dulcolax tablet as directed. 14. Hypocalcemia: Supplement started.  15. Hypokalemia:   7/8: Continues to be low despite increase in supplementation- continue aggressive supplementation and repeat tomorrow.   7/9: corrected to normal. Continue 47meq BID and repeat K+ tomorrow.  16.  CKD: Cr down to 1.45, overall improved from prior.      LOS: 3 days A FACE TO FACE EVALUATION WAS PERFORMED  Rhodesia Stanger P Johnpaul Gillentine 12/10/2019, 10:08 AM

## 2019-12-10 NOTE — Progress Notes (Signed)
Occupational Therapy Session Note  Patient Details  Name: William Walker MRN: [id number] Date of Birth: 06-12-1926  Today's Date: 12/10/2019 OT Individual Time: 5625-6389 OT Individual Time Calculation (min): 28 min    Short Term Goals: Week 1:  OT Short Term Goal 1 (Week 1): STGs=LTGs due to ELOS  Skilled Therapeutic Interventions/Progress Updates:    Treatment session with focus on ADL retraining and functional transfers. Pt received semi-reclined in bed reporting discomfort due to "knot" in medial portion of thigh of RLE. OTS assessed with no knot noted, repositioned and educated on potential rationale for knot/tightness. Pt declined bathing and dressing. Upon sitting at EOB with CGA, pt's brief soiled. Pt completed ambulatory transfer with RW with CGA to [facility] over toilet. Completed clothing management of pants with CGA and provided assistance with brief and to complete perineal care of buttocks to adhere to sternal precautions. Pt able to void urine seated at [facility], with pt reporting he normally voids in standing but was happy he could void on the "potty chair". Ambulated to w/c with RW with CGA and verbal cues to maintain sternal precautions during stand > sit. Ended session with pt seated in w/c with all needs within reach and seat alarm on.   Therapy Documentation Precautions:  Precautions Precautions: Fall, Sternal Precaution Comments: educated in sternal precautions Restrictions Weight Bearing Restrictions: No   Pain:   "knot" in RLE reported, repositioned  Therapy/Group: Individual Therapy  Michelle Nasuti 12/10/2019, 12:21 PM

## 2019-12-10 NOTE — IPOC Note (Signed)
Overall Plan of Care [facility]) Patient Details Name: William Walker MRN: [id number] DOB: 1926/06/14  Admitting Diagnosis: [facility] Problems: Principal Problem:   Debility     Functional Problem List: Nursing Bladder, Bowel, Edema, Endurance, Motor, Safety, Pain, Skin Integrity, Medication Management  PT Balance, Endurance, Motor, Pain, Sensory  OT Balance, Endurance, Motor  SLP    TR         Basic ADL's: OT Bathing, Dressing, Toileting     Advanced  ADL's: OT       Transfers: PT Bed Mobility, Bed to Chair, Car  OT Toilet, Tub/Shower     Locomotion: PT Ambulation, Stairs     Additional Impairments: OT    SLP        TR      Anticipated Outcomes Item Anticipated Outcome  Self Feeding Independent  Swallowing      Basic self-care  Mod I - Supervision  Toileting  Mod I - Supervision   Bathroom Transfers Mod I - Supervision  Bowel/Bladder  manage bowel and bladder with mod I assist  Transfers  Supervision  Locomotion  Supervision  Communication     Cognition     Pain  Pain level less than 4 on scale of 0-10  Safety/Judgment  remain free of injury, prevent falls with cues and reminders   Therapy Plan: PT Intensity: Minimum of 1-2 x/day ,45 to 90 minutes PT Frequency: 5 out of 7 days PT Duration Estimated Length of Stay: 7-10 Days OT Intensity: Minimum of 1-2 x/day, 45 to 90 minutes OT Frequency: 5 out of 7 days OT Duration/Estimated Length of Stay: 7-10 days     Due to the current state of emergency, patients may not be receiving their 3-hours of Medicare-mandated therapy.   Team Interventions: Nursing Interventions Patient/Family Education, Disease Management/Prevention, Discharge Planning, Pain Management, Psychosocial Support, Dysphagia/Aspiration Precaution Training, Bladder Management  PT interventions Ambulation/gait training, Community reintegration, DME/adaptive equipment instruction, Neuromuscular re-education, Psychosocial support,  Stair training, UE/LE Strength taining/ROM, Training and development officer, Wheelchair propulsion/positioning, UE/LE Coordination activities, Therapeutic Activities, Pain management, Functional electrical stimulation, Discharge planning, Skin care/wound management, Cognitive remediation/compensation, Disease management/prevention, Functional mobility training, Patient/family education, Therapeutic Exercise, Visual/perceptual remediation/compensation  OT Interventions Balance/vestibular training, Discharge planning, Self Care/advanced ADL retraining, Therapeutic Activities, UE/LE Coordination activities, Visual/perceptual remediation/compensation, Therapeutic Exercise, Patient/family education, Functional mobility training, Disease mangement/prevention, DME/adaptive equipment instruction, Neuromuscular re-education, Community reintegration, Psychosocial support, UE/LE Strength taining/ROM, Wheelchair propulsion/positioning  SLP Interventions    TR Interventions    SW/CM Interventions     Barriers to Discharge MD  Medical stability  Nursing      PT Home environment access/layout    OT      SLP      SW       Team Discharge Planning: Destination: PT-Home ,OT- Home , SLP-  Projected Follow-up: PT-Outpatient PT, OT-  Home health OT, SLP-  Projected Equipment Needs: PT-To be determined, OT- To be determined, SLP-  Equipment Details: PT- , OT-  Patient/family involved in discharge planning: PT- Patient,  OT-Patient, SLP-   MD ELOS: 7-10 days Medical Rehab Prognosis:  Excellent Assessment: William Walker is a 86 year old man who is admitted to CIR with debility secondary to severe multivessel CAD plus critical AS. Status post CABG x4 with bioprosthetic AVR 11/22/2019. He is maintianing sternal precautions. Pain is well controlled without medication. He is receiving Lovenox and Aspirin for anticoagulation and antiplatelet therapy, respectively. Hypokalemia has been corrected and he remains on  supplementation with daily  lab checks. Creatinine has been downtrending. Hemoglobins have been stable  . CBGs have been very labile and he has been changed to sensitive scale.    See Team Conference Notes for weekly updates to the plan of care

## 2019-12-11 ENCOUNTER — Inpatient Hospital Stay (HOSPITAL_COMMUNITY): Payer: Medicare Other | Admitting: Physical Therapy

## 2019-12-11 ENCOUNTER — Inpatient Hospital Stay (HOSPITAL_COMMUNITY): Payer: Medicare Other

## 2019-12-11 DIAGNOSIS — E876 Hypokalemia: Secondary | ICD-10-CM

## 2019-12-11 DIAGNOSIS — N179 Acute kidney failure, unspecified: Secondary | ICD-10-CM

## 2019-12-11 DIAGNOSIS — E1165 Type 2 diabetes mellitus with hyperglycemia: Secondary | ICD-10-CM

## 2019-12-11 LAB — BASIC METABOLIC PANEL
Anion gap: 12 (ref 5–15)
BUN: 41 mg/dL — ABNORMAL HIGH (ref 8–23)
CO2: 28 mmol/L (ref 22–32)
Calcium: 8.7 mg/dL — ABNORMAL LOW (ref 8.9–10.3)
Chloride: 97 mmol/L — ABNORMAL LOW (ref 98–111)
Creatinine, Ser: 1.44 mg/dL — ABNORMAL HIGH (ref 0.61–1.24)
GFR calc Af Amer: 49 mL/min — ABNORMAL LOW (ref >60)
GFR calc non Af Amer: 42 mL/min — ABNORMAL LOW (ref >60)
Glucose, Bld: 105 mg/dL — ABNORMAL HIGH (ref 70–99)
Potassium: 4 mmol/L (ref 3.5–5.1)
Sodium: 137 mmol/L (ref 135–145)

## 2019-12-11 LAB — GLUCOSE, CAPILLARY
Glucose-Capillary: 97 mg/dL (ref 70–99)
Glucose-Capillary: 132 mg/dL — ABNORMAL HIGH (ref 70–99)
Glucose-Capillary: 172 mg/dL — ABNORMAL HIGH (ref 70–99)
Glucose-Capillary: 204 mg/dL — ABNORMAL HIGH (ref 70–99)

## 2019-12-11 MED ORDER — CYANOCOBALAMIN 500 MCG PO TABS
250.0000 ug | ORAL_TABLET | Freq: Every day | ORAL | Status: DC
  Administered 2019-12-11 – 2019-12-16 (×6): 250 ug via ORAL
  Filled 2019-12-11 (×6): qty 1

## 2019-12-11 NOTE — Progress Notes (Signed)
[facility] PHYSICAL MEDICINE & REHABILITATION PROGRESS NOTE   Subjective/Complaints: Patient seen sitting up at the edge of his bed this morning eating breakfast.  He states he did not sleep well overnight due to an incontinent episode.  Good sitting balance noted.  ROS: Denies CP, SOB, N/V/D  Objective:   No results found. No results for input(s): WBC, HGB, HCT, PLT in the last 72 hours. Recent Labs    12/10/19 0431 12/11/19 0335  NA 140 137  K 3.9 4.0  CL 100 97*  CO2 31 28  GLUCOSE 103* 105*  BUN 34* 41*  CREATININE 1.45* 1.44*  CALCIUM 8.8* 8.7*    Intake/Output Summary (Last 24 hours) at 12/11/2019 1514 Last data filed at 12/11/2019 1330 Gross per 24 hour  Intake 358 ml  Output 350 ml  Net 8 ml     Physical Exam: Vital Signs Blood pressure (!) 111/52, pulse 79, temperature 98.3 F (36.8 C), resp. rate 18, height 5\' 11"  (1.803 m), weight 94 kg, SpO2 93 %. Constitutional: No distress . Vital signs reviewed. HENT: Normocephalic.  Atraumatic. Eyes: EOMI. No discharge. Cardiovascular: No JVD.  RRR. Respiratory: Normal effort.  No stridor.  Bilaterally clear to auscultation. GI: Non-distended. Skin: Warm and dry.  Intact.  Lower extremities wrapped Psych: Normal mood.  Normal behavior. Musc: Lower extremity edema Neuro: Alert HOH Motor: Grossly 4+/5 throughout  Assessment/Plan: 1. Functional deficits secondary to cardiac debility which require 3+ hours per day of interdisciplinary therapy in a comprehensive inpatient rehab setting.  Physiatrist is providing close team supervision and 24 hour management of active medical problems listed below.  Physiatrist and rehab team continue to assess barriers to discharge/monitor patient progress toward functional and medical goals  Care Tool:  Bathing    Body parts bathed by patient: Right arm, Left arm, Chest, Abdomen, Face   Body parts bathed by helper: Buttocks     Bathing assist Assist Level: Minimal  Assistance - Patient > 75%     Upper Body Dressing/Undressing Upper body dressing   What is the patient wearing?: Pull over shirt    Upper body assist Assist Level: Contact Guard/Touching assist    Lower Body Dressing/Undressing Lower body dressing      What is the patient wearing?: Incontinence brief     Lower body assist Assist for lower body dressing: Moderate Assistance - Patient 50 - 74%     Toileting Toileting    Toileting assist Assist for toileting: Minimal Assistance - Patient > 75%     Transfers Chair/bed transfer  Transfers assist     Chair/bed transfer assist level: Minimal Assistance - Patient > 75%     Locomotion Ambulation   Ambulation assist      Assist level: Supervision/Verbal cueing Assistive device: Walker-rolling Max distance: 120   Walk 10 feet activity   Assist     Assist level: Supervision/Verbal cueing Assistive device: Walker-rolling   Walk 50 feet activity   Assist    Assist level: Supervision/Verbal cueing Assistive device: Walker-rolling    Walk 150 feet activity   Assist    Assist level: Contact Guard/Touching assist Assistive device: Walker-rolling    Walk 10 feet on uneven surface  activity   Assist     Assist level: Contact Guard/Touching assist Assistive device: Walker-rolling   Wheelchair     Assist Will patient use wheelchair at discharge?: No             Wheelchair 50 feet with 2 turns activity  Assist            Wheelchair 150 feet activity     Assist          Blood pressure (!) 111/52, pulse 79, temperature 98.3 F (36.8 C), resp. rate 18, height 5\' 11"  (1.803 m), weight 94 kg, SpO2 93 %.  Medical Problem List and Plan: 1.  Debility secondary to severe multivessel CAD plus critical AS.  Status post CABG x4 with bioprosthetic AVR 11/22/2019.  Sternal precautions.  Continue CIR 2.  Antithrombotics: -DVT/anticoagulation: Lovenox             -antiplatelet  therapy: Aspirin 325 mg daily 3. Pain Management: Oxycodone as needed- not using, discontinued 4. Mood: Provide emotional support             -antipsychotic agents: N/A 5. Neuropsych: This patient is capable of making decisions on his own behalf. 6. Skin/Wound Care: Routine skin checks.  Patient does have bilateral Unna boots in place. 7. Fluids/Electrolytes/Nutrition: Routine in and outs.  8.  Acute blood loss anemia with macrocytosis.  Follow-up CBCs weekly.   Hemoglobin 9.0 on 7/7  Vitamin B12 reviewed borderline low-supplement initiated on 7/10 9.  Post cardiotomy shock.  Continue ProAmatine as directed as well as Demadex.  Monitor for any signs of fluid overload 10.  Diabetes mellitus with hyperglycemia.  Hemoglobin A1c 7.4.  SSI.  Patient on Glucotrol 5 mg daily prior to admission and resume as needed in place of insulin.              Decreased Levemir to 15U.   Changed to Novolog sensitive scale.   Labile on 7/10, monitor for trend 11.  Hyperlipidemia.  Crestor 12.  Hypothyroidism.  Synthroid 13.  Constipation.  Continue Colace and Dulcolax tablet as directed. 14. Hypocalcemia: Supplement started.  15. Hypokalemia:   Continue 68meq BID   Potassium 4.2 on 7/10 16. AKI on CKD:   Creatinine 1.44 on 7/10  Continue to monitor   LOS: 4 days A FACE TO FACE EVALUATION WAS PERFORMED  Adilyn Humes Lorie Phenix 12/11/2019, 3:14 PM

## 2019-12-11 NOTE — Progress Notes (Signed)
Physical Therapy Session Note  Patient Details  Name: William Walker MRN: [id number] Date of Birth: August 15, 1926  Today's Date: 12/11/2019 PT Individual Time: 1004-1100 PT Individual Time Calculation (min): 56 min   Short Term Goals: Week 1:  PT Short Term Goal 1 (Week 1): STGs = LTGs due to ELOS  Skilled Therapeutic Interventions/Progress Updates: Pt presents sitting in recliner and agreeable to therapy.  Family member present during session.  Pt performed doffing gown and donning T-shirt w/ set-up and verbal cues to maintain sternal precautions.  Pt able to pull up shorts once threaded over feet and then transfers sit to stand w/ min to mod A d/t low height.  Pt wished to wear shoes so total A for socks and then able to don shoes w/ Velcro strap.  Pt amb to gym w/ RW and supervision, verbal cues for posture and increased step length.  Pt amb multiple trials w/ RW up to 120' including turns to return to surface.  Pt requires occasional verbal cues for safe turns.  Pt returned to recliner and LEs elevated, shoes removed 2/2 edema present.  Hair alarm on and all needs in reach.  Dtr remained in room at conclusion of therapy.     Therapy Documentation Precautions:  Precautions Precautions: Fall, Sternal Precaution Comments: educated in sternal precautions Restrictions Weight Bearing Restrictions: No General:   Vital Signs:  Pain: pt c/o burning in feet        Therapy/Group: Individual Therapy  Ladoris Gene 12/11/2019, 11:53 AM

## 2019-12-11 NOTE — Progress Notes (Signed)
Occupational Therapy Session Note  Patient Details  Name: William Walker MRN: [id number] Date of Birth: 1927-01-12  Today's Date: 12/11/2019 OT Individual Time: 0814-4818 and 1300-1400 OT Individual Time Calculation (min): 75 min and 60 min   Short Term Goals: Week 1:  OT Short Term Goal 1 (Week 1): STGs=LTGs due to ELOS  Skilled Therapeutic Interventions/Progress Updates:    Session 1: OT session focused on standing balance, activity tolerance, and functional transfers. Pt received supine in bed reporting not sleep well due to incontinence. Pt requested to remain in room for therapy this AM due to frequency for urinating. Pt voided 2x during session using red bucket and while sitting EOB. Pt ambulated recliner<>sink using RW with min A sit<>stand and CGA for ambulation. Pt completed oral care in standing x3 min. Engaged in functional activities in standing to promote balance, sit<>stand with adherence to precautions, and endurance. Pt sustained standing 5x for 2-6 minutes each. At end of session, pt left in recliner with all needs in reach.   Session 2: OT session focused on functional transfers, endurance, caregiver education, and bed mobility. Pt received sitting in recliner with daughter, [name], present. Discussed shower setup in preparation of discharge. Transitioned to ADL apartment with OT educating and demonstrating use of shower chair. Family reports multiple grab bars in shower with client demonstrating ability to step in/out tub with CGA/SBA. Educated on benefits of shower chair for endurance and safety with daughter exploring options. Ambulated to standard bed completing sit<>supine with supervision. Pt ambulated back to room using RW with SBA and 2 rest breaks. Continued caregiver education and discharge planning focusing on EC techniques. Pt left sitting in recliner with all needs in reach and family reporting no questions. Of note, pt voided 1x during session and independent to manage  bucket and clothing (loose shorts).   Therapy Documentation Precautions:  Precautions Precautions: Fall, Sternal Precaution Comments: educated in sternal precautions Restrictions Weight Bearing Restrictions: No General:   Vital Signs: Therapy Vitals Temp: 98.3 F (36.8 C) Pulse Rate: 79 Resp: 18 BP: (!) 111/52 Patient Position (if appropriate): Lying Oxygen Therapy SpO2: 93 % O2 Device: Room Air Pain:   ADL: ADL Grooming: Supervision/safety Where Assessed-Grooming: Sitting at sink Upper Body Bathing: Contact guard Where Assessed-Upper Body Bathing: Other (Comment) (sitting on toilet) Where Assessed-Lower Body Bathing: Other (Comment) Upper Body Dressing: Contact guard Where Assessed-Upper Body Dressing: Other (Comment) (sitting in toilet) Lower Body Dressing: Moderate assistance Where Assessed-Lower Body Dressing: Other (Comment) (sitting on toilet, sit to stand to don over hips) Toileting: Moderate assistance Where Assessed-Toileting: Glass blower/designer: Moderate assistance (mod sit to stand, Min for stand pivot) Toilet Transfer Method: Stand pivot Vision   Perception    Praxis   Exercises:   Other Treatments:     Therapy/Group: Individual Therapy  Duayne Cal 12/11/2019, 9:20 AM

## 2019-12-12 DIAGNOSIS — R7309 Other abnormal glucose: Secondary | ICD-10-CM

## 2019-12-12 DIAGNOSIS — D62 Acute posthemorrhagic anemia: Secondary | ICD-10-CM

## 2019-12-12 LAB — BASIC METABOLIC PANEL
Anion gap: 11 (ref 5–15)
BUN: 39 mg/dL — ABNORMAL HIGH (ref 8–23)
CO2: 30 mmol/L (ref 22–32)
Calcium: 8.7 mg/dL — ABNORMAL LOW (ref 8.9–10.3)
Chloride: 98 mmol/L (ref 98–111)
Creatinine, Ser: 1.51 mg/dL — ABNORMAL HIGH (ref 0.61–1.24)
GFR calc Af Amer: 46 mL/min — ABNORMAL LOW (ref >60)
GFR calc non Af Amer: 40 mL/min — ABNORMAL LOW (ref >60)
Glucose, Bld: 125 mg/dL — ABNORMAL HIGH (ref 70–99)
Potassium: 3.8 mmol/L (ref 3.5–5.1)
Sodium: 139 mmol/L (ref 135–145)

## 2019-12-12 LAB — GLUCOSE, CAPILLARY
Glucose-Capillary: 96 mg/dL (ref 70–99)
Glucose-Capillary: 127 mg/dL — ABNORMAL HIGH (ref 70–99)
Glucose-Capillary: 121 mg/dL — ABNORMAL HIGH (ref 70–99)
Glucose-Capillary: 152 mg/dL — ABNORMAL HIGH (ref 70–99)
Glucose-Capillary: 134 mg/dL — ABNORMAL HIGH (ref 70–99)

## 2019-12-12 NOTE — Progress Notes (Signed)
PHYSICAL MEDICINE & REHABILITATION PROGRESS NOTE   Subjective/Complaints: Patient seen sitting up in bed this AM, eating breakfast.  He states he slept very well overnight. He denies complaints this AM.  ROS: Denies CP, SOB, N/V/D  Objective:   No results found. No results for input(s): WBC, HGB, HCT, PLT in the last 72 hours. Recent Labs    12/11/19 0335 12/12/19 0326  NA 137 139  K 4.0 3.8  CL 97* 98  CO2 28 30  GLUCOSE 105* 125*  BUN 41* 39*  CREATININE 1.44* 1.51*  CALCIUM 8.7* 8.7*    Intake/Output Summary (Last 24 hours) at 12/12/2019 1351 Last data filed at 12/12/2019 1300 Gross per 24 hour  Intake 820 ml  Output 1430 ml  Net -610 ml     Physical Exam: Vital Signs Blood pressure (!) 103/51, pulse 73, temperature 98.1 F (36.7 C), temperature source Oral, resp. rate 18, height 5\' 11"  (1.803 m), weight 94.1 kg, SpO2 95 %.  Constitutional: No distress . Vital signs reviewed. HENT: Normocephalic.  Atraumatic. Eyes: EOMI. No discharge. Cardiovascular: No JVD. Respiratory: Normal effort.  No stridor. GI: Non-distended. Skin: Warm and dry.  Intact. Lower extremities wrapped. Psych: Normal mood.  Normal behavior. Musc: Lower extremity edema Neuro: Alert HOH Motor: Grossly 4+/5 throughout, unchanged  Assessment/Plan: 1. Functional deficits secondary to cardiac debility which require 3+ hours per day of interdisciplinary therapy in a comprehensive inpatient rehab setting.  Physiatrist is providing close team supervision and 24 hour management of active medical problems listed below.  Physiatrist and rehab team continue to assess barriers to discharge/monitor patient progress toward functional and medical goals  Care Tool:  Bathing    Body parts bathed by patient: Right arm, Left arm, Chest, Abdomen, Face   Body parts bathed by helper: Buttocks     Bathing assist Assist Level: Minimal Assistance - Patient > 75%     Upper Body  Dressing/Undressing Upper body dressing   What is the patient wearing?: Pull over shirt    Upper body assist Assist Level: Contact Guard/Touching assist    Lower Body Dressing/Undressing Lower body dressing      What is the patient wearing?: Incontinence brief     Lower body assist Assist for lower body dressing: Moderate Assistance - Patient 50 - 74%     Toileting Toileting    Toileting assist Assist for toileting: Minimal Assistance - Patient > 75%     Transfers Chair/bed transfer  Transfers assist     Chair/bed transfer assist level: Minimal Assistance - Patient > 75%     Locomotion Ambulation   Ambulation assist      Assist level: Supervision/Verbal cueing Assistive device: Walker-rolling Max distance: 120   Walk 10 feet activity   Assist     Assist level: Supervision/Verbal cueing Assistive device: Walker-rolling   Walk 50 feet activity   Assist    Assist level: Supervision/Verbal cueing Assistive device: Walker-rolling    Walk 150 feet activity   Assist    Assist level: Contact Guard/Touching assist Assistive device: Walker-rolling    Walk 10 feet on uneven surface  activity   Assist     Assist level: Contact Guard/Touching assist Assistive device: Walker-rolling   Wheelchair     Assist Will patient use wheelchair at discharge?: No             Wheelchair 50 feet with 2 turns activity    Assist  Wheelchair 150 feet activity     Assist          Blood pressure (!) 103/51, pulse 73, temperature 98.1 F (36.7 C), temperature source Oral, resp. rate 18, height 5\' 11"  (1.803 m), weight 94.1 kg, SpO2 95 %.  Medical Problem List and Plan: 1.  Debility secondary to severe multivessel CAD plus critical AS.  Status post CABG x4 with bioprosthetic AVR 11/22/2019.  Sternal precautions.  Continue CIR 2.  Antithrombotics: -DVT/anticoagulation: Lovenox             -antiplatelet therapy: Aspirin  325 mg daily 3. Pain Management: Oxycodone as needed- not using, discontinued 4. Mood: Provide emotional support             -antipsychotic agents: N/A 5. Neuropsych: This patient is capable of making decisions on his own behalf. 6. Skin/Wound Care: Routine skin checks.  Patient does have bilateral Unna boots in place. 7. Fluids/Electrolytes/Nutrition: Routine in and outs.  8.  Acute blood loss anemia with macrocytosis.  Follow-up CBCs weekly.   Hemoglobin 9.0 on 7/7  Vitamin B12 reviewed borderline low-supplement initiated on 7/10 9.  Post cardiotomy shock.  Continue ProAmatine as directed as well as Demadex.  Monitor for any signs of fluid overload 10.  Diabetes mellitus with hyperglycemia.  Hemoglobin A1c 7.4.  SSI.  Patient on Glucotrol 5 mg daily prior to admission and resume as needed in place of insulin.              Decreased Levemir to 15U.   Changed to Novolog sensitive scale.   Labile on 7/11, will consider transition to oral hypoglycemic agent 11.  Hyperlipidemia.  Crestor 12.  Hypothyroidism.  Synthroid 13.  Constipation.  Continue Colace and Dulcolax tablet as directed. 14. Hypocalcemia: Supplement started.  15. Hypokalemia:   Continue 42meq BID   Potassium 4.2 on 7/10 16. AKI on CKD:   Creatinine 1.44 on 7/10  Continue to monitor   LOS: 5 days A FACE TO FACE EVALUATION WAS PERFORMED  Electa Sterry Lorie Phenix 12/12/2019, 1:51 PM

## 2019-12-12 NOTE — Progress Notes (Signed)
Sutures out. Patient tolerated.Incision site dry and intact.

## 2019-12-13 ENCOUNTER — Inpatient Hospital Stay (HOSPITAL_COMMUNITY): Payer: Medicare Other

## 2019-12-13 ENCOUNTER — Inpatient Hospital Stay (HOSPITAL_COMMUNITY): Payer: Medicare Other | Admitting: Occupational Therapy

## 2019-12-13 LAB — BASIC METABOLIC PANEL
Anion gap: 15 (ref 5–15)
BUN: 41 mg/dL — ABNORMAL HIGH (ref 8–23)
CO2: 27 mmol/L (ref 22–32)
Calcium: 8.7 mg/dL — ABNORMAL LOW (ref 8.9–10.3)
Chloride: 98 mmol/L (ref 98–111)
Creatinine, Ser: 1.57 mg/dL — ABNORMAL HIGH (ref 0.61–1.24)
GFR calc Af Amer: 44 mL/min — ABNORMAL LOW (ref >60)
GFR calc non Af Amer: 38 mL/min — ABNORMAL LOW (ref >60)
Glucose, Bld: 117 mg/dL — ABNORMAL HIGH (ref 70–99)
Potassium: 3.9 mmol/L (ref 3.5–5.1)
Sodium: 140 mmol/L (ref 135–145)

## 2019-12-13 LAB — GLUCOSE, CAPILLARY
Glucose-Capillary: 124 mg/dL — ABNORMAL HIGH (ref 70–99)
Glucose-Capillary: 160 mg/dL — ABNORMAL HIGH (ref 70–99)
Glucose-Capillary: 158 mg/dL — ABNORMAL HIGH (ref 70–99)
Glucose-Capillary: 149 mg/dL — ABNORMAL HIGH (ref 70–99)

## 2019-12-13 MED ORDER — GLIPIZIDE ER 5 MG PO TB24
5.0000 mg | ORAL_TABLET | Freq: Every day | ORAL | Status: DC
  Administered 2019-12-14 – 2019-12-16 (×3): 5 mg via ORAL
  Filled 2019-12-13 (×3): qty 1

## 2019-12-13 MED ORDER — INSULIN DETEMIR 100 UNIT/ML ~~LOC~~ SOLN
5.0000 [IU] | Freq: Every day | SUBCUTANEOUS | Status: DC
  Administered 2019-12-14 – 2019-12-16 (×3): 5 [IU] via SUBCUTANEOUS
  Filled 2019-12-13 (×3): qty 0.05

## 2019-12-13 NOTE — Progress Notes (Signed)
[facility] PHYSICAL MEDICINE & REHABILITATION PROGRESS NOTE   Subjective/Complaints:  Pt reports no pain- slept well- voiding "like a river"- has a condom catheter- LBM/great one this AM per pt.   Told CTS wasn't emptying his bladder, told me he was voiding "like a river"- not clear which is occurring.   Concerned about LE edema- has uniboots in place since 7/6   ROS:  Pt denies SOB, abd pain, CP, N/V/C/D, and vision changes   Objective:   No results found. No results for input(s): WBC, HGB, HCT, PLT in the last 72 hours. Recent Labs    12/12/19 0326 12/13/19 0409  NA 139 140  K 3.8 3.9  CL 98 98  CO2 30 27  GLUCOSE 125* 117*  BUN 39* 41*  CREATININE 1.51* 1.57*  CALCIUM 8.7* 8.7*    Intake/Output Summary (Last 24 hours) at 12/13/2019 1953 Last data filed at 12/13/2019 1200 Gross per 24 hour  Intake 580 ml  Output 2000 ml  Net -1420 ml     Physical Exam: Vital Signs Blood pressure (!) 149/70, pulse 91, temperature 98.2 F (36.8 C), resp. rate 20, height 5\' 11"  (1.803 m), weight 93.5 kg, SpO2 97 %.  Constitutional: No distress . Vital signs reviewed.sitting up in bed- appropriate, vague, RN in room, upset about his  Breakfast of "pills", NAD HENT: Normocephalic.  Atraumatic. Eyes: conjugate gaze Cardiovascular: no JVD. Respiratory: no accessory muscle use GI: Non-distended. Skin: Warm and dry.  Intact. Lower extremities wrapped in uniboots- L>R feet more swollen than legs- 2-3+ feet edema; 1-2+ leg edema, b/l Psych:vague/appropriate Musc: Lower extremity edema Neuro: Alert HOH Motor: Grossly 4+/5 throughout, unchanged  Assessment/Plan: 1. Functional deficits secondary to cardiac debility which require 3+ hours per day of interdisciplinary therapy in a comprehensive inpatient rehab setting.  Physiatrist is providing close team supervision and 24 hour management of active medical problems listed below.  Physiatrist and rehab team continue to assess barriers  to discharge/monitor patient progress toward functional and medical goals  Care Tool:  Bathing    Body parts bathed by patient: Right arm, Left arm, Chest, Abdomen, Face   Body parts bathed by helper: Buttocks     Bathing assist Assist Level: Minimal Assistance - Patient > 75%     Upper Body Dressing/Undressing Upper body dressing   What is the patient wearing?: Pull over shirt    Upper body assist Assist Level: Supervision/Verbal cueing    Lower Body Dressing/Undressing Lower body dressing      What is the patient wearing?: Pants, Underwear/pull up     Lower body assist Assist for lower body dressing: Contact Guard/Touching assist     Toileting Toileting    Toileting assist Assist for toileting: Minimal Assistance - Patient > 75%     Transfers Chair/bed transfer  Transfers assist     Chair/bed transfer assist level: Supervision/Verbal cueing     Locomotion Ambulation   Ambulation assist      Assist level: Supervision/Verbal cueing Assistive device: Walker-rolling Max distance: 200'   Walk 10 feet activity   Assist     Assist level: Supervision/Verbal cueing Assistive device: Walker-rolling   Walk 50 feet activity   Assist    Assist level: Supervision/Verbal cueing Assistive device: Walker-rolling    Walk 150 feet activity   Assist    Assist level: Supervision/Verbal cueing Assistive device: Walker-rolling    Walk 10 feet on uneven surface  activity   Assist     Assist level: Contact Guard/Touching  assist Assistive device: Aeronautical engineer Will patient use wheelchair at discharge?: No             Wheelchair 50 feet with 2 turns activity    Assist            Wheelchair 150 feet activity     Assist          Blood pressure (!) 149/70, pulse 91, temperature 98.2 F (36.8 C), resp. rate 20, height 5\' 11"  (1.803 m), weight 93.5 kg, SpO2 97 %.  Medical Problem List and  Plan: 1.  Debility secondary to severe multivessel CAD plus critical AS.  Status post CABG x4 with bioprosthetic AVR 11/22/2019.  Sternal precautions.  Continue CIR 2.  Antithrombotics: -DVT/anticoagulation: Lovenox             -antiplatelet therapy: Aspirin 325 mg daily 3. Pain Management: Oxycodone as needed- not using, discontinued 4. Mood: Provide emotional support             -antipsychotic agents: N/A 5. Neuropsych: This patient is capable of making decisions on his own behalf. 6. Skin/Wound Care: Routine skin checks.  Patient does have bilateral Unna boots in place. 7. Fluids/Electrolytes/Nutrition: Routine in and outs.  8.  Acute blood loss anemia with macrocytosis.  Follow-up CBCs weekly.   Hemoglobin 9.0 on 7/7  Vitamin B12 reviewed borderline low-supplement initiated on 7/10 9.  Post cardiotomy shock.  Continue ProAmatine as directed as well as Demadex.  Monitor for any signs of fluid overload 10.  Diabetes mellitus with hyperglycemia.  Hemoglobin A1c 7.4.  SSI.  Patient on Glucotrol 5 mg daily prior to admission and resume as needed in place of insulin.              Decreased Levemir to 15U.   Changed to Novolog sensitive scale.   Labile on 7/11, will consider transition to oral hypoglycemic agent  7/12- decrease Levemir to 5 units and add back Glucotrol 5 mg XL from home 11.  Hyperlipidemia.  Crestor 12.  Hypothyroidism.  Synthroid 13.  Constipation.  Continue Colace and Dulcolax tablet as directed. 14. Hypocalcemia: Supplement started.  15. Hypokalemia:   Continue 11meq BID   Potassium 4.2 on 7/10  7/12- K+ 3.9 16. AKI on CKD:   Creatinine 1.44 on 7/10  7/12- Cr 1.57- creeping up- likely torsemide dose- will check with Cards about dosing.   Continue to monitor  17. Urinary frequency  7/12- will check bladder scans ot make sure emptying.   LOS: 6 days A FACE TO FACE EVALUATION WAS PERFORMED  Rynn Markiewicz 12/13/2019, 7:53 PM

## 2019-12-13 NOTE — Progress Notes (Signed)
      [name]       [name]             [phone]      Subjective: Patient making good progress in rehab.  Notes that he has been walking in the hall and even walking on steps.  His biggest complaint now is his large inguinal hernia and urinary frequency  Objective: Vital signs in last 24 hours: Temp:  [97.4 F (36.3 C)-98.9 F (37.2 C)] 98.2 F (36.8 C) (07/12 1332) Pulse Rate:  [74-91] 91 (07/12 1332) Resp:  [18-20] 20 (07/12 1332) BP: (113-149)/(56-88) 149/70 (07/12 1332) SpO2:  [91 %-97 %] 97 % (07/12 1332) Weight:  [93.5 kg] 93.5 kg (07/12 0500)  Hemodynamic parameters for last 24 hours:    Intake/Output from previous day: 07/11 0701 - 07/12 0700 In: 444 [P.O.:444] Out: 3400 [Urine:3400] Intake/Output this shift: Total I/O In: 358 [P.O.:358] Out: -   General appearance: alert, cooperative, appears stated age and no distress Neurologic: intact Heart: regular rate and rhythm, S1, S2 normal, no murmur, click, rub or gallop Lungs: clear to auscultation bilaterally Abdomen: soft, non-tender; bowel sounds normal; no masses,  no organomegaly Extremities: edema Legs wrapped with Unna boots, decreased pedal edema bilaterally Wound: Sternal incision is well-healed Right arm PICC line remains in place  Lab Results: No results for input(s): WBC, HGB, HCT, PLT in the last 72 hours. BMET:  Recent Labs    12/12/19 0326 12/13/19 0409  NA 139 140  K 3.8 3.9  CL 98 98  CO2 30 27  GLUCOSE 125* 117*  BUN 39* 41*  CREATININE 1.51* 1.57*  CALCIUM 8.7* 8.7*    PT/INR: No results for input(s): LABPROT, INR in the last 72 hours. ABG    Component Value Date/Time   PHART 7.323 (L) 11/23/2019 0211   HCO3 18.4 (L) 11/23/2019 0211   TCO2 19 (L) 11/23/2019 0211   ACIDBASEDEF 7.0 (H) 11/23/2019 0211   O2SAT 61.3 12/07/2019 0450   CBG (last 3)  Recent Labs    12/12/19 2241 12/13/19 0624 12/13/19 1134  GLUCAP 134* 124* 160*     Assessment/Plan: Status post aortic valve replacement and coronary artery bypass grafting normal-good progress with inpatient rehab making steady progress with mobility Complaint of urinary frequency and small volume of urine on diuretics-we will check bladder scan for residual  right PICC line still in place-removed before discharge home Contact cardiology for follow-up home meds before discharge    LOS: 6 days    William Walker 12/13/2019

## 2019-12-13 NOTE — Progress Notes (Signed)
Physical Therapy Session Note  Patient Details  Name: William Walker MRN: [id number] Date of Birth: 1926-08-17  Today's Date: 12/13/2019 PT Individual Time: 2119-4174 and 0814-48185 PT Individual Time Calculation (min): 57 min and 28 min  Short Term Goals: Week 1:  PT Short Term Goal 1 (Week 1): STGs = LTGs due to ELOS  Skilled Therapeutic Interventions/Progress Updates:     1st Session: Pt received supine in bed and agreeable to therapy. No report of pain. Pt does report that catheter has been leaking. PT removes bag catheter and alerts RN. Supine to sit with supervision and cues for positioning. Pt stands from EOB with RW and PT assists with positioning urine bucked for voiding. Pt is continent of urine. Pt then stands at sink for several minutes with alternating UE support and at times no UE support to perform self cleaning. PT assists with cleaning backside. Otherwise pt performs entire activity with supervision.   Pt ambulates multiple bouts with RW 120', 180', and 200' with extended seated rest breaks. Verbal cues for upright gaze to decrease risk for falls and decreasing WB through RW for energy conservation. Pt reports modified RPE around 6/10 following each bout of ambulation. Pt left seated in recliner with alarm intact and all needs within reach.  2nd Session: Pt received seated in recliner. No complaint of pain. Pt performs stand step to [facility] with RW with supervision for positioning and hand placement.   Pt performs x8 steps with BHR and daughter providing close supervision, with PT educating on proper caregiver positioning and cues. Following stair training pt attempts sit to stand without use of BUEs. Pt is able to eccentrically control stand to sit but requires minA to complete sit to stand without arms. Activity performed for strength and balance training.  Pt ambulates 200' back to room with close supervision from daughter. Left seated in recliner with all needs within  reach.  Therapy Documentation Precautions:  Precautions Precautions: Fall, Sternal Precaution Comments: educated in sternal precautions Restrictions Weight Bearing Restrictions: No    Therapy/Group: Individual Therapy  Breck Coons, PT, DPT 12/13/2019, 7:55 AM

## 2019-12-13 NOTE — Progress Notes (Signed)
Inpatient Rehabilitation Care Coordinator Assessment and Plan  Patient Details  Name: William Walker MRN: [id number] Date of Birth: 01/19/1927  Today's Date: 12/13/2019  Problem List:  Patient Active Problem List   Diagnosis Date Noted  . Labile blood glucose   . Acute blood loss anemia   . AKI (acute kidney injury) ([place])   . Hypokalemia   . Controlled type 2 diabetes mellitus with hyperglycemia, without long-term current use of insulin ([place])   . Debility 12/07/2019  . S/P aortic valve replacement with bioprosthetic valve 11/22/2019  . Non-STEMI (non-ST elevated myocardial infarction) ([place])   . Bilateral lower extremity edema   . Macrocytosis without anemia   . Chest pain 02/05/2017  . Severe aortic stenosis 02/05/2017  . Type II or unspecified type diabetes mellitus with other specified manifestations, not stated as uncontrolled 06/17/2013  . Occlusion and stenosis of carotid artery without mention of cerebral infarction 06/17/2013  . Right inguinal hernia 06/17/2013  . Neuropathy due to secondary diabetes mellitus ([place]) 06/17/2013  . Peripheral neuropathy 04/01/2012  . Peripheral vascular disease of foot ([place]) 04/01/2012  . Bilateral carotid artery disease ([place]) 04/01/2012  . CAD, NATIVE VESSEL 01/25/2010  . Hyperlipidemia with target LDL less than 70 12/23/2008  . Essential hypertension 12/23/2008  . CEREBROVASCULAR DISEASE, HX OF 12/23/2008   Past Medical History:  Past Medical History:  Diagnosis Date  . Aortic stenosis   . Coronary atherosclerosis of native coronary artery    Reportedly 2 stents - presumably LAD (records not available)  . CVD (cerebrovascular disease)   . Essential hypertension   . GERD (gastroesophageal reflux disease)   . Hyperlipidemia   . MI (myocardial infarction) [facility])    October 1999  . Type 2 diabetes mellitus with diabetic neuropathy [facility])    Past Surgical History:  Past Surgical History:  Procedure Laterality Date  . AORTIC VALVE  REPLACEMENT N/A 11/22/2019   Procedure: AORTIC VALVE REPLACEMENT (AVR) USING INSPIRIS 23MM;  Surgeon: [name], MD;  Location: [facility];  Service: Open Heart Surgery;  Laterality: N/A;  . Arch cerebral ateriogram  12/11/2005   Rosetta Posner MD  . CATARACT EXTRACTION, BILATERAL    . CORONARY ARTERY BYPASS GRAFT N/A 11/22/2019   Procedure: CORONARY ARTERY BYPASS GRAFTING (CABG), ON PUMP, TIMES FOUR , USING LEFT INTERNAL MAMMARY ARTERY AND ENDOSCOPICALLY HARVESTED RIGHT GREATER SAPHENOUS VEIN;  Surgeon: [name], MD;  Location: [place];  Service: Open Heart Surgery;  Laterality: N/A;  SEQUENTIAL RAMUS INTERMEDIATE TO OM1 SAPHENOUS VEIN TO DISTAL PDA LIMA TO LAD  . INGUINAL HERNIA REPAIR Left 12/09/2012   Procedure: HERNIA REPAIR INGUINAL with mesh;  Surgeon: [name], MD;  Location: [facility];  Service: General;  Laterality: Left;  . INSERTION OF MESH Left 12/09/2012   Procedure: INSERTION OF MESH;  Surgeon: [name], MD;  Location: [facility];  Service: General;  Laterality: Left;  . RIGHT/LEFT HEART CATH AND CORONARY ANGIOGRAPHY N/A 10/07/2017   Procedure: RIGHT/LEFT HEART CATH AND CORONARY ANGIOGRAPHY;  Surgeon: [name], MD;  Location: [place] [facility];  Service: Cardiovascular;  Laterality: N/A;  . RIGHT/LEFT HEART CATH AND CORONARY ANGIOGRAPHY N/A 11/16/2019   Procedure: RIGHT/LEFT HEART CATH AND CORONARY ANGIOGRAPHY;  Surgeon: Lorretta Harp, MD;  Location: [place] [facility];  Service: Cardiovascular;  Laterality: N/A;  . TEE WITHOUT CARDIOVERSION N/A 11/22/2019   Procedure: TRANSESOPHAGEAL ECHOCARDIOGRAM (TEE);  Surgeon: [name], MD;  Location: [facility];  Service: Open Heart  Surgery;  Laterality: N/A;   Social History:  reports that he quit smoking about 71 years ago. His smoking use included pipe and cigars. His smokeless tobacco use includes chew. He reports that he does not drink alcohol and does not use drugs.  Family / Support Systems Children:  Son Anticipated Caregiver: William Walker is main contact.  Pt has his wife at home, 2 daughters, and a son Ability/Limitations of Caregiver: None Caregiver Availability: 24/7  Social History Preferred language: English Religion: Christian Education: [employer] Read: Yes Write: Yes Employment Status: Retired Date Retired/Disabled/Unemployed: Warehouse manager   Abuse/Neglect Abuse/Neglect Assessment Can Be Completed: Yes Physical Abuse: Denies Verbal Abuse: Denies Sexual Abuse: Denies Exploitation of patient/patient's resources: Denies Self-Neglect: Denies  Emotional Status Pt's affect, behavior and adjustment status: Normal mood, affect and behavior  Patient / Family Perceptions, Expectations & Goals Pt/Family understanding of illness & functional limitations: Patient abd family have a fair understanding of current health and functional limitations Premorbid pt/family roles/activities: Very social, golf, mowing, piddles in shops, grocery shopping and 4 wheeler Anticipated changes in roles/activities/participation: May need to have supervision or help with activities Pt/family expectations/goals: Increase strength and return to normal living activities  [employer]: None Premorbid Home Care/DME Agencies: None Transportation available at discharge: Children will provide transportation at discharge Resource referrals recommended: Neuropsychology  Discharge Planning Living Arrangements: Spouse/significant other Support Systems: Spouse/significant other, Children Type of Residence: Private residence Insurance Resources: Medicare Financial Screen Referred: No Living Expenses: Own Money Management: Family, Patient Does the patient have any problems obtaining your medications?: No Home Management: Wife will manage home with children to assist as needed Patient/Family Preliminary Plans: Discharge home with wife and children to assist as  needed Care Coordinator Anticipated Follow Up Needs: HH/OP Expected length of stay: 7-10 days  Clinical Impression Good family support. Ready for therapy. Looking forward for strength to return so he can return to his normal living activities.  Dorthula Nettles G 12/13/2019, 8:49 AM

## 2019-12-13 NOTE — Progress Notes (Signed)
Occupational Therapy Session Note  Patient Details  Name: William Walker MRN: [id number] Date of Birth: 1926/11/24  Today's Date: 12/13/2019 OT Individual Time: 2774-1287 and 1502-1530 OT Individual Time Calculation (min): 70 min and 28 min   Short Term Goals: Week 1:  OT Short Term Goal 1 (Week 1): STGs=LTGs due to ELOS  Skilled Therapeutic Interventions/Progress Updates:    Pt greeted at time of session sitting up in recliner with daughter [name] present throughout session, no c/o pain throughout. Pt using red bucket to urinate at beginning of session, agreeable to OT session to work on dressing and shower transfer. UB dressing sitting in recliner with supervision for sternal precautions, LB dressing CGA with pt able to forward weight shift to thread both underwear and pants, standing to don over hips and zip/button. Pt and daughter ed throughout about sit to stand techniques to maintain precautions and techniques for dressing to prevent breaking precautions, both very pleasant and receptive. Pt ambulated throughout room and approx 100-120 feet in hall with CGA/CS with RW before bringing pt remaining distance in wheelchair to apartment. Demonstrated tub transfer with bars, pt practiced as well for side stepping into tub, difficulty clearing feet and very unsafe stepping pattern, therapist demonstrated proper form for pt and daughter. Pt has glass door on shower and will need shower seat, unable to manage TTB. Reviewed energy conservation techniques as well for sitting during showers, pt is receptive but wants to stand for most of his showers. Pt did have one episode of urinating in clothes  d/t urgency and was changed back in room. Pt up in recliner with alarm on, call bell in reach, all needs met and daughter [name] in room.  Session 2: Pt greeted at time of session sitting up in recliner with daughter [name] present, no c/o pain throughout session. Discussed home set up, amount of assistance to be provided  at home, and discharge planning. Pt then ambulated throughout room with RW to bathroom and performed stand pivot to toilet CGA able to control descent better and able to perform hygiene with adaptive technique to turn torso to maintain sternal precautions, pt also has long arms that help. Reviewed sit to stands from toilet with compensatory techniques pushing up from surface while keeping elbows at side. Clothing management CGA-Min A. Ambulated to sink and performed hand hygiene in standing with CGA before ambulating back to recliner. Pt up in chair with alarm on, call bell in reach, all needs met. Note that pt and daughter state they will purchase shower seat.  Therapy Documentation Precautions:  Precautions Precautions: Fall, Sternal Precaution Comments: educated in sternal precautions Restrictions Weight Bearing Restrictions: No    Therapy/Group: Individual Therapy  [name] 12/13/2019, 12:33 PM

## 2019-12-14 ENCOUNTER — Inpatient Hospital Stay (HOSPITAL_COMMUNITY): Payer: Medicare Other

## 2019-12-14 ENCOUNTER — Inpatient Hospital Stay (HOSPITAL_COMMUNITY): Payer: Medicare Other | Admitting: Occupational Therapy

## 2019-12-14 LAB — BASIC METABOLIC PANEL
Anion gap: 10 (ref 5–15)
BUN: 37 mg/dL — ABNORMAL HIGH (ref 8–23)
CO2: 32 mmol/L (ref 22–32)
Calcium: 8.6 mg/dL — ABNORMAL LOW (ref 8.9–10.3)
Chloride: 96 mmol/L — ABNORMAL LOW (ref 98–111)
Creatinine, Ser: 1.49 mg/dL — ABNORMAL HIGH (ref 0.61–1.24)
GFR calc Af Amer: 47 mL/min — ABNORMAL LOW (ref >60)
GFR calc non Af Amer: 40 mL/min — ABNORMAL LOW (ref >60)
Glucose, Bld: 102 mg/dL — ABNORMAL HIGH (ref 70–99)
Potassium: 3.4 mmol/L — ABNORMAL LOW (ref 3.5–5.1)
Sodium: 138 mmol/L (ref 135–145)

## 2019-12-14 LAB — GLUCOSE, CAPILLARY
Glucose-Capillary: 108 mg/dL — ABNORMAL HIGH (ref 70–99)
Glucose-Capillary: 133 mg/dL — ABNORMAL HIGH (ref 70–99)
Glucose-Capillary: 101 mg/dL — ABNORMAL HIGH (ref 70–99)
Glucose-Capillary: 201 mg/dL — ABNORMAL HIGH (ref 70–99)

## 2019-12-14 MED ORDER — POTASSIUM CHLORIDE CRYS ER 20 MEQ PO TBCR
40.0000 meq | EXTENDED_RELEASE_TABLET | Freq: Once | ORAL | Status: AC
  Administered 2019-12-14: 40 meq via ORAL
  Filled 2019-12-14: qty 2

## 2019-12-14 NOTE — Discharge Summary (Signed)
Physician Discharge Summary  Patient ID: William Walker MRN: [id number] DOB/AGE: [birth year] 86 y.o.  Admit date: 12/07/2019 Discharge date: 12/16/2019  Discharge Diagnoses:  Principal Problem:   Debility Active Problems:   AKI (acute kidney injury) ([place])   Hypokalemia   Controlled type 2 diabetes mellitus with hyperglycemia, without long-term current use of insulin (HCC)   Labile blood glucose   Acute blood loss anemia Hyperlipidemia Hypothyroidism  Discharged Condition: Stable  Significant Diagnostic Studies: CARDIAC CATHETERIZATION  Result Date: 11/16/2019  Mid LAD to Dist LAD lesion is 20% stenosed.  Ramus lesion is 95% stenosed.  Ost LAD lesion is 99% stenosed.  Ost LM to Mid LM lesion is 75% stenosed.  Prox Cx to Mid Cx lesion is 50% stenosed.  2nd Diag lesion is 90% stenosed.  Prox RCA lesion is 75% stenosed.  Hemodynamic findings consistent with aortic valve stenosis.  William Walker is a 86 y.o. male  [id number] LOCATION:  FACILITY: [facility] PHYSICIAN: Quay Burow, M.D. 1927/04/11 DATE OF PROCEDURE:  11/16/2019 DATE OF DISCHARGE: CARDIAC CATHETERIZATION History obtained from chart review. William Walker is a 86 y.o. male with with a hx of severe calcific aortic stenosis, multivessel CAD with moderate diffuse disease and patent LAD stent per last LHC 2019, CVD, hypertension, HLD and DM2 who is being seen today in the ER yesterday for chest pain which began on Sunday.  He has chronic nitrate responsive angina.  His troponins rose to 4000.  His EKG showed subtle J-point elevation.  His last 2D echo performed 3 months ago showed severe aortic stenosis.  He is referred for right left heart cath to define his anatomy and physiology.   [name] has had progression of his CAD.  His mid LAD stent is patent.  He has left main/three-vessel disease.  I believe he has at least 75% left main with a 99% ostial LAD 95% proximal to mid ramus branch stenosis as well as progression of disease in his  dominant RCA.  He has severe aortic stenosis.  I do not think he is a percutaneous revascularization candidate.  Mynx closure devices were used to hemostatically sealed the right common femoral artery and venous puncture sites.  The patient left lab in stable condition.  Given his left main, severe CAD and the fact that these had chest pain with non-STEMI I am upgrading him to [facility] for closer clinical observation. Quay Burow. MD, [facility], [place] 11/16/2019 9:52 AM   DG Chest Port 1 View  Result Date: 12/04/2019 CLINICAL DATA:  Pleural effusion. EXAM: PORTABLE CHEST 1 VIEW COMPARISON:  12/01/2019 FINDINGS: Median sternotomy, valve replacement, and CABG. The heart is enlarged but stable in configuration. There is atherosclerotic calcification of the thoracic BB aorta. RIGHT-sided PICC line tip overlies the superior vena cava. Bilateral pleural effusions and bibasilar opacities are similar to previous. No pneumothorax. IMPRESSION: 1. Stable cardiomegaly. 2. Stable bilateral pleural effusions and bibasilar opacities. Electronically Signed   By: Nolon Nations M.D.   On: 12/04/2019 09:40   DG Chest Port 1 View  Result Date: 12/01/2019 CLINICAL DATA:  Chest tube EXAM: PORTABLE CHEST 1 VIEW COMPARISON:  Three days ago FINDINGS: PICC on the right with tip at the SVC. Aortic valve replacement and CABG. Pleural effusions and presumed atelectasis. No edema or pneumothorax. IMPRESSION: Stable pleural effusions and presumed atelectasis. Electronically Signed   By: Monte Fantasia M.D.   On: 12/01/2019 08:22   DG Chest Port 1 View  Result Date: 11/28/2019 CLINICAL DATA:  Status  post cardiac surgery. EXAM: PORTABLE CHEST 1 VIEW COMPARISON:  November 27, 2019 FINDINGS: A right PICC line terminates in the central SVC. Bilateral effusions with underlying atelectasis. No change in the cardiomegaly. The hila and mediastinum are stable. No other abnormalities. IMPRESSION: Findings are most consistent with layering effusions and  underlying atelectasis. No significant interval changes. Electronically Signed   By: Dorise Bullion III M.D   On: 11/28/2019 08:41   DG Chest Port 1 View  Result Date: 11/27/2019 CLINICAL DATA:  Status post cardiac surgery with aortic valve replacement. Follow-up exam. EXAM: PORTABLE CHEST 1 VIEW COMPARISON:  11/26/2019 FINDINGS: Cardiac silhouette is normal in size. No mediastinal widening. Changes from the median sternotomy and aortic valve replacement are stable. Bilateral pleural effusions with associated atelectasis stable on the left and mildly improved on the right from the previous day's exam. Remainder of the lungs is clear. No pneumothorax. Right PICC is stable and well positioned. IMPRESSION: 1. Mild decrease in right lung base pleural fluid/atelectasis. 2. No other change. No acute abnormality or evidence of an operative complication. 3. Small persistent left pleural effusion with atelectasis. Electronically Signed   By: Lajean Manes M.D.   On: 11/27/2019 09:17   DG Chest Port 1 View  Result Date: 11/26/2019 CLINICAL DATA:  Chest tube removal, recent cardiac surgery EXAM: PORTABLE CHEST 1 VIEW COMPARISON:  Radiograph 11/25/2019 FINDINGS: Right upper extremity PICC terminates at the superior cavoatrial junction. Telemetry leads and nasal cannula overlie the chest. Interval removal of the left basilar chest tube and right IJ catheter sheath. Abandoned epicardial pacer leads project over the upper abdomen/lower mediastinum. There is stable postoperative mediastinal contours with features of recent sternotomy and aortic valve replacement with CABG. Lucency of over the lower mediastinum is compatible with a large hiatal hernia. Some persistent layering effusion is present in the lung bases, right slightly greater than left. No pneumothorax. Likely some adjacent areas of passive atelectasis. No significant residual edema. IMPRESSION: 1. Interval removal of left basilar chest tube and right IJ  catheter sheath. 2. Otherwise stable postoperative mediastinal contours. 3. Persistent layering effusions, right slightly greater than left. Associated areas of passive atelectasis. No significant residual edema. 4. Large hiatal hernia. Electronically Signed   By: Lovena Le M.D.   On: 11/26/2019 06:34   DG Chest Port 1 View  Result Date: 11/25/2019 CLINICAL DATA:  Follow-up chest tube EXAM: PORTABLE CHEST 1 VIEW COMPARISON:  11/24/2019 FINDINGS: Cardiac shadow remains enlarged. Postsurgical changes are again seen. Swan-Ganz catheter has been removed in the interval. Right jugular sheath remains but is kinked at the skin surface. Left-sided chest tube is again noted and stable. Small bilateral pleural effusions are noted right greater than left. No pneumothorax is noted. IMPRESSION: Stable appearance of the chest with the exception of interval removal of the Swan-Ganz catheter. Electronically Signed   By: Inez Catalina M.D.   On: 11/25/2019 08:10   DG Chest Port 1 View  Result Date: 11/24/2019 CLINICAL DATA:  Patient status post CABG 11/22/2019. EXAM: PORTABLE CHEST 1 VIEW COMPARISON:  Single-view of the chest 11/23/2019 and 11/22/2019. FINDINGS: Swan-Ganz catheter, mediastinal drain and left chest tube remain in place. Moderate right pleural effusion and basilar atelectasis have increased. Small to moderate left pleural effusion and associated atelectasis are unchanged. No pneumothorax. Heart size is enlarged. IMPRESSION: No change in support tubes and lines. Negative for pneumothorax with a left chest tube in place. Small to moderate bilateral pleural effusions and basilar airspace disease. The patient's  right effusion has increased since yesterday's exam Electronically Signed   By: Inge Rise M.D.   On: 11/24/2019 09:10   DG Chest Port 1 View  Result Date: 11/23/2019 CLINICAL DATA:  Post cardiac surgery EXAM: PORTABLE CHEST 1 VIEW COMPARISON:  11/22/2019 FINDINGS: Endotracheal and enteric tubes  are no longer present. Right IJ Swan-Ganz catheter is unchanged. Left chest tubes again noted. No pneumothorax. Small bilateral pleural effusions and bibasilar atelectasis. Similar cardiomediastinal contours. Hiatal hernia. IMPRESSION: Lines and tubes as above.  No pneumothorax. Small bilateral pleural effusions and bibasilar atelectasis. Electronically Signed   By: Macy Mis M.D.   On: 11/23/2019 09:03   DG Chest Port 1 View  Result Date: 11/22/2019 CLINICAL DATA:  Postoperative status. EXAM: PORTABLE CHEST 1 VIEW COMPARISON:  Same day. FINDINGS: Stable cardiomegaly. Endotracheal tube appears to be in good position. Nasogastric tube tip is seen in distal esophagus. Status post coronary bypass graft and aortic valve repair. Right internal jugular Swan-Ganz catheter is noted with tip in expected position of main pulmonary artery. Right lung is clear. Left-sided chest tube is noted without definite pneumothorax. Mild left basilar atelectasis is noted with small pleural effusion. Bony thorax is unremarkable. IMPRESSION: Left-sided chest tube is noted without definite pneumothorax. Mild left basilar atelectasis is noted with small pleural effusion. Nasogastric tube tip is seen in distal esophagus and advancement is recommended. Electronically Signed   By: Marijo Conception M.D.   On: 11/22/2019 17:12   DG CHEST PORT 1 VIEW  Result Date: 11/22/2019 CLINICAL DATA:  Preop evaluation for upcoming coronary bypass grafting EXAM: PORTABLE CHEST 1 VIEW COMPARISON:  11/15/2019 FINDINGS: Cardiac shadow is mildly enlarged but stable. Aortic calcifications are again seen and stable. Previously noted hiatal hernia is less well appreciated on today's exam. The lungs are clear. No bony abnormality is noted. IMPRESSION: No acute abnormality noted. Aortic Atherosclerosis (ICD10-I70.0). Electronically Signed   By: Inez Catalina M.D.   On: 11/22/2019 08:24   VAS [place] LOWER EXTREMITY SAPHENOUS VEIN MAPPING  Result Date:  11/17/2019 LOWER EXTREMITY VEIN MAPPING Indications:  Pre-op Risk Factors: Coronary artery disease.  Comparison Study: No prior study Performing Technologist: Maudry Mayhew MHA, RDMS, RVT, RDCS  Examination Guidelines: A complete evaluation includes B-mode imaging, spectral Doppler, color Doppler, and power Doppler as needed of all accessible portions of each vessel. Bilateral testing is considered an integral part of a complete examination. Limited examinations for reoccurring indications may be performed as noted. +---------------+-----------+----------------------+---------------+-----------+   RT Diameter  RT Findings         GSV            LT Diameter  LT Findings      (cm)                                            (cm)                  +---------------+-----------+----------------------+---------------+-----------+      0.29                     Saphenofemoral         0.35  Junction                                  +---------------+-----------+----------------------+---------------+-----------+      0.27                     Proximal thigh         0.20                  +---------------+-----------+----------------------+---------------+-----------+      0.17                       Mid thigh            0.14                  +---------------+-----------+----------------------+---------------+-----------+      0.19                      Distal thigh          0.14                  +---------------+-----------+----------------------+---------------+-----------+      0.15                          Knee              0.16                  +---------------+-----------+----------------------+---------------+-----------+      0.19       branching       Prox calf            0.18       branching  +---------------+-----------+----------------------+---------------+-----------+                                  Mid calf             0.16                  +---------------+-----------+----------------------+---------------+-----------+                                Distal calf           0.17                  +---------------+-----------+----------------------+---------------+-----------+ +----------------+--------------+--------------+----------------+--------------+ RT diameter (cm) RT Findings       SSV      LT Diameter (cm) LT Findings   +----------------+--------------+--------------+----------------+--------------+                 not visualized  Popliteal                   not visualized                                   fossa                                    +----------------+--------------+--------------+----------------+--------------+                 not visualizedProximal calf  not visualized +----------------+--------------+--------------+----------------+--------------+                 not visualized   Mid calf                   not visualized +----------------+--------------+--------------+----------------+--------------+                 not visualized Distal calf                  not visualized +----------------+--------------+--------------+----------------+--------------+ Diagnosing physician: Harold Barban MD Electronically signed by Harold Barban MD on 11/17/2019 at 11:21:01 PM.    Final    ECHOCARDIOGRAM COMPLETE  Result Date: 11/16/2019    ECHOCARDIOGRAM REPORT   Patient Name:   William Walker Date of Exam: 11/16/2019 Medical Rec #:  [id number]     Height:       71.0 in Accession #:    [id number]    Weight:       206.8 lb Date of Birth:  25-Jan-1927      BSA:          2.139 m Patient Age:    83 years      BP:           130/76 mmHg Patient Gender: M             HR:           79 bpm. Exam Location:  Inpatient Procedure: 2D Echo, Cardiac Doppler, Color Doppler and Intracardiac            Opacification Agent Indications:    Aortic Stenosis 424.1 / 135.0   History:        Patient has prior history of Echocardiogram examinations, most                 recent 05/04/2019. CAD, Aortic Valve Disease,                 Arrythmias:non-specific ST changes, Signs/Symptoms:Chest Pain;                 Risk Factors:Hypertension, Dyslipidemia, Diabetes and Former                 Smoker.  Sonographer:    Vickie Epley RDCS Referring Phys: [place]  1. Left ventricular ejection fraction, by estimation, is 40 to 45%. The left ventricle has normal function. The left ventricle demonstrates regional wall motion abnormalities (see scoring diagram/findings for description). Left ventricular diastolic parameters are consistent with Grade I diastolic dysfunction (impaired relaxation). There is moderate hypokinesis of the left ventricular, entire apical segment.  2. Right ventricular systolic function is normal. The right ventricular size is normal. There is normal pulmonary artery systolic pressure. The estimated right ventricular systolic pressure is 34.1 mmHg.  3. The mitral valve is normal in structure. Mild mitral valve regurgitation.  4. Tricuspid valve regurgitation is mild to moderate.  5. The aortic valve is tricuspid. Aortic valve regurgitation is mild. Severe aortic valve stenosis. Comparison(s): Prior images reviewed side by side. Changes from prior study are noted. The left ventricular function is worsened. The left ventricular wall motion abnormality is is new. Aortic stenosis remains severe, but gradients are slightly lower, likely due to reduced left ventricular systolic function. FINDINGS  Left Ventricle: Left ventricular ejection fraction, by estimation, is 40 to 45%. The left ventricle has normal function. The left ventricle demonstrates regional wall motion abnormalities. Moderate hypokinesis of the left ventricular, entire apical segment. Definity  contrast agent was given IV to delineate the left ventricular endocardial borders. The left ventricular  internal cavity size was normal in size. There is no left ventricular hypertrophy. Left ventricular diastolic parameters are consistent with Grade I diastolic dysfunction (impaired relaxation). Right Ventricle: The right ventricular size is normal. No increase in right ventricular wall thickness. Right ventricular systolic function is normal. There is normal pulmonary artery systolic pressure. The tricuspid regurgitant velocity is 2.65 m/s, and  with an assumed right atrial pressure of 3 mmHg, the estimated right ventricular systolic pressure is 69.6 mmHg. Left Atrium: Left atrial size was normal in size. Right Atrium: Right atrial size was normal in size. Pericardium: There is no evidence of pericardial effusion. Mitral Valve: The mitral valve is normal in structure. Mild mitral valve regurgitation. Tricuspid Valve: The tricuspid valve is normal in structure. Tricuspid valve regurgitation is mild to moderate. Aortic Valve: The aortic valve is tricuspid. . There is severe thickening and moderate calcification of the aortic valve. Aortic valve regurgitation is mild. Aortic regurgitation PHT measures 418 msec. Severe aortic stenosis is present. There is severe thickening of the aortic valve. There is moderate calcification of the aortic valve. Aortic valve mean gradient measures 34.0 mmHg. Aortic valve peak gradient measures 54.5 mmHg. Aortic valve area, by VTI measures 0.90 cm. Pulmonic Valve: The pulmonic valve was not well visualized. Pulmonic valve regurgitation is not visualized. Aorta: The aortic root and ascending aorta are structurally normal, with no evidence of dilitation. IAS/Shunts: No atrial level shunt detected by color flow Doppler.  LEFT VENTRICLE PLAX 2D LVIDd:         5.20 cm      Diastology LVIDs:         4.00 cm      LV e' lateral:   5.08 cm/s LV PW:         1.00 cm      LV E/e' lateral: 11.3 LV IVS:        1.00 cm      LV e' medial:    3.23 cm/s LVOT diam:     2.40 cm      LV E/e' medial:  17.8  LV SV:         75 LV SV Index:   35 LVOT Area:     4.52 cm  LV Volumes (MOD) LV vol d, MOD A2C: 78.8 ml LV vol d, MOD A4C: 136.0 ml LV vol s, MOD A2C: 46.2 ml LV vol s, MOD A4C: 60.6 ml LV SV MOD A2C:     32.6 ml LV SV MOD A4C:     136.0 ml LV SV MOD BP:      57.3 ml RIGHT VENTRICLE RV S prime:     11.00 cm/s TAPSE (M-mode): 1.9 cm LEFT ATRIUM           Index       RIGHT ATRIUM           Index LA diam:      3.00 cm 1.40 cm/m  RA Area:     14.90 cm LA Vol (A2C): 39.4 ml 18.42 ml/m RA Volume:   41.00 ml  19.17 ml/m LA Vol (A4C): 48.3 ml 22.58 ml/m  AORTIC VALVE AV Area (Vmax):    0.91 cm AV Area (Vmean):   0.98 cm AV Area (VTI):     0.90 cm AV Vmax:           369.00 cm/s AV Vmean:  268.000 cm/s AV VTI:            0.830 m AV Peak Grad:      54.5 mmHg AV Mean Grad:      34.0 mmHg LVOT Vmax:         74.20 cm/s LVOT Vmean:        57.900 cm/s LVOT VTI:          0.165 m LVOT/AV VTI ratio: 0.20 AI PHT:            418 msec  AORTA Ao Root diam: 4.00 cm MITRAL VALVE               TRICUSPID VALVE MV Area (PHT): 3.23 cm    TR Peak grad:   28.1 mmHg MV Decel Time: 235 msec    TR Vmax:        265.00 cm/s MV E velocity: 57.40 cm/s MV A velocity: 98.50 cm/s  SHUNTS MV E/A ratio:  0.58        Systemic VTI:  0.16 m                            Systemic Diam: 2.40 cm Dani Gobble Croitoru MD Electronically signed by Sanda Klein MD Signature Date/Time: 11/16/2019/4:48:20 PM    Final    ECHO INTRAOPERATIVE TEE  Result Date: 11/22/2019  *INTRAOPERATIVE TRANSESOPHAGEAL REPORT *  Patient Name:   William Walker  Date of Exam: 11/22/2019 Medical Rec #:  [id number]      Height:       71.0 in Accession #:    [id number]     Weight:       200.8 lb Date of Birth:  [birth year]       BSA:          2.11 m Patient Age:    67 years       BP:           125/69 mmHg Patient Gender: M              HR:           55 bpm. Exam Location:  Anesthesiology Transesophogeal exam was perform intraoperatively during surgical procedure. Patient was closely  monitored under general anesthesia during the entirety of examination. Indications:     CAD Native Vessel, Severe Aortic Stenosis Sonographer:     Raquel Sarna Senior RDCS Performing Phys: [id number] TXMIWOEH Diagnosing Phys: Roderic Palau MD Complications: No known complications during this procedure. POST-OP IMPRESSIONS - Left Ventricle: The LV post bypass was difficult to assess due to the difficuly passing the echo probe into the stomach. - Aortic Valve: A bioprosthetic valve is in the aortic position. The leaflets are freely mobile. There is trace AI seen that is directed centrally. A gradient across the valve is unattainable due to difficulty advancing the echo probe into the stomach. - Mitral Valve: The mitral valve appears unchanged from pre-bypass. - Tricuspid Valve: The tricuspid valve appears unchanged from pre-bypass. PRE-OP FINDINGS  Left Ventricle: The left ventricle has low normal systolic function, with an ejection fraction of 50-55%. The cavity size was normal. There is no increase in left ventricular wall thickness. Right Ventricle: The right ventricle has normal systolic function. The cavity was normal. There is no increase in right ventricular wall thickness. Left Atrium: Left atrial size was not assessed. The left atrial appendage is well visualized and there is no evidence of thrombus present. Right Atrium:  Right atrial size was not assessed. Interatrial Septum: No atrial level shunt detected by color flow Doppler. Pericardium: Trivial pericardial effusion is present. Mitral Valve: The mitral valve is normal in structure. Mitral valve regurgitation is mild by color flow Doppler. The MR jet is centrally-directed. Tricuspid Valve: The tricuspid valve was normal in structure. Tricuspid valve regurgitation is mild by color flow Doppler. Aortic Valve: The aortic valve is tricuspid There is severe thickening of the aortic valve and There is severe calcifcation of the aortic valve Aortic valve  regurgitation is mild to moderate by color flow Doppler. There is severe stenosis of the aortic valve, with a calculated valve area of 1.10 cm. Pulmonic Valve: The pulmonic valve was normal in structure. Pulmonic valve regurgitation is trivial by color flow Doppler. +--------------+--------++ LEFT VENTRICLE         +--------------+--------++ PLAX 2D                +--------------+--------++ LVOT diam:    2.10 cm  +--------------+--------++ LVOT Area:    3.46 cm +--------------+--------++                        +--------------+--------++ +------------------+------------++ AORTIC VALVE                   +------------------+------------++ AV Area (Vmax):   0.97 cm     +------------------+------------++ AV Area (Vmean):  1.02 cm     +------------------+------------++ AV Area (VTI):    1.10 cm     +------------------+------------++ AV Vmax:          210.00 cm/s  +------------------+------------++ AV Vmean:         151.033 cm/s +------------------+------------++ AV VTI:           0.475 m      +------------------+------------++ AV Peak Grad:     17.6 mmHg    +------------------+------------++ AV Mean Grad:     13.7 mmHg    +------------------+------------++ LVOT Vmax:        58.90 cm/s   +------------------+------------++ LVOT Vmean:       44.600 cm/s  +------------------+------------++ LVOT VTI:         0.150 m      +------------------+------------++ LVOT/AV VTI ratio:0.32         +------------------+------------++ +----------------+-----------++ MR Peak grad:   70.6 mmHg   +--------------+-------+ +----------------+-----------++ SHUNTS                MR Mean grad:   55.0 mmHg   +--------------+-------+ +----------------+-----------++ Systemic VTI: 0.15 m  MR Vmax:        420.00 cm/s +--------------+-------+ +----------------+-----------++ Systemic Diam:2.10 cm MR Vmean:       359.0 cm/s  +--------------+-------+  +----------------+-----------++ MR PISA:        0.57 cm    +----------------+-----------++ MR PISA Eff ROA:4 mm       +----------------+-----------++ MR PISA Radius: 0.30 cm     +----------------+-----------++  Roderic Palau MD Electronically signed by Roderic Palau MD Signature Date/Time: 11/22/2019/4:44:00 PM    Final    VAS US DOPPLER PRE CABG  Result Date: 11/16/2019 PREOPERATIVE VASCULAR EVALUATION  Indications:      Pre-CABG. Risk Factors:     Hypertension, hyperlipidemia, Diabetes, coronary artery                   disease. Other Factors:    Aortic valve disease. Comparison Study: 02/03/2019 carotid artery duplex: right 40-59% ICA stenosis,  total occlusion left ICA. Performing Technologist: Maudry Mayhew MHA, RVT, RDCS, RDMS  Examination Guidelines: A complete evaluation includes B-mode imaging, spectral Doppler, color Doppler, and power Doppler as needed of all accessible portions of each vessel. Bilateral testing is considered an integral part of a complete examination. Limited examinations for reoccurring indications may be performed as noted.  Right Carotid Findings: +----------+-------+-------+--------+---------------------------------+--------+           PSV    EDV    StenosisDescribe                         Comments           cm/s   cm/s                                                     +----------+-------+-------+--------+---------------------------------+--------+ CCA Prox  77     17                                                       +----------+-------+-------+--------+---------------------------------+--------+ CCA Distal132    27             heterogenous, irregular and                                               calcific                                  +----------+-------+-------+--------+---------------------------------+--------+ ICA Prox  294    70     60-79%  heterogenous, calcific and                                                 irregular                                 +----------+-------+-------+--------+---------------------------------+--------+ ICA Distal104    29                                                       +----------+-------+-------+--------+---------------------------------+--------+ ECA       341           >50%    heterogenous and calcific                 +----------+-------+-------+--------+---------------------------------+--------+ Portions of this table do not appear on this page. +----------+--------+-------+----------------+------------+           PSV cm/sEDV cmsDescribe        Arm Pressure +----------+--------+-------+----------------+------------+ Subclavian136            Multiphasic, WNL             +----------+--------+-------+----------------+------------+ +---------+--------+--+--------+--+---------+  VertebralPSV cm/s75EDV cm/s18Antegrade +---------+--------+--+--------+--+---------+ Left Carotid Findings: +----------+-------+-------+--------+---------------------------------+--------+           PSV    EDV    StenosisDescribe                         Comments           cm/s   cm/s                                                     +----------+-------+-------+--------+---------------------------------+--------+ CCA Prox  59     9              heterogenous, irregular and                                               calcific                                  +----------+-------+-------+--------+---------------------------------+--------+ CCA Distal59                                                              +----------+-------+-------+--------+---------------------------------+--------+ ICA Prox                Occludedheterogenous and calcific                 +----------+-------+-------+--------+---------------------------------+--------+ ECA       77                    heterogenous and  irregular                +----------+-------+-------+--------+---------------------------------+--------+ +----------+--------+--------+----------------+------------+ SubclavianPSV cm/sEDV cm/sDescribe        Arm Pressure +----------+--------+--------+----------------+------------+           91              Multiphasic, WNL             +----------+--------+--------+----------------+------------+ +---------+--------+--------+--------------+ VertebralPSV cm/sEDV cm/sNot identified +---------+--------+--------+--------------+  ABI Findings: +--------+------------------+-----+----------+--------+ Right   Rt Pressure (mmHg)IndexWaveform  Comment  +--------+------------------+-----+----------+--------+ [id number]                    triphasic          +--------+------------------+-----+----------+--------+ PTA                            triphasic          +--------+------------------+-----+----------+--------+ DP      52                0.38 monophasic         +--------+------------------+-----+----------+--------+ +--------+------------------+-----+----------+-------+ Left    Lt Pressure (mmHg)IndexWaveform  Comment +--------+------------------+-----+----------+-------+ [id number]                    triphasic         +--------+------------------+-----+----------+-------+ PTA  triphasic         +--------+------------------+-----+----------+-------+ DP      211               1.55 monophasic        +--------+------------------+-----+----------+-------+ +-------+---------------+----------------+ ABI/TBIToday's ABI/TBIPrevious ABI/TBI +-------+---------------+----------------+ Right  Noncompressible                 +-------+---------------+----------------+ Left   Noncompressible                 +-------+---------------+----------------+  Right Doppler Findings:  +-----------+--------+-----+---------+-----------------------------------------+ Site       PressureIndexDoppler  Comments                                  +-----------+--------+-----+---------+-----------------------------------------+ Brachial   117          triphasic                                          +-----------+--------+-----+---------+-----------------------------------------+ Radial                  triphasic                                          +-----------+--------+-----+---------+-----------------------------------------+ Ulnar                   triphasic                                          +-----------+--------+-----+---------+-----------------------------------------+ Palmar Arch                      Signal obliterates with radial                                             compression, is unaffected with ulnar                                      compression                               +-----------+--------+-----+---------+-----------------------------------------+  Left Doppler Findings: +-----------+--------+-----+---------+-----------------------------------------+ Site       PressureIndexDoppler  Comments                                  +-----------+--------+-----+---------+-----------------------------------------+ Brachial   136          triphasic                                          +-----------+--------+-----+---------+-----------------------------------------+ Radial                  triphasic                                          +-----------+--------+-----+---------+-----------------------------------------+  Ulnar                   triphasic                                          +-----------+--------+-----+---------+-----------------------------------------+ Palmar Arch                      Signal decreases >50% with radial                                          compression, is  unaffected with ulnar                                      compression.                              +-----------+--------+-----+---------+-----------------------------------------+  Summary: Right Carotid: Velocities in the right ICA are consistent with a 60-79%                stenosis. When compared to prior study, right ICA stenosis has                progressed. Left Carotid: Evidence consistent with a total occlusion of the left ICA. Vertebrals:  Right vertebral artery demonstrates antegrade flow. Left vertebral              artery was not visualized. Subclavians: Normal flow hemodynamics were seen in bilateral subclavian              arteries. Right ABI: Resting right ankle-brachial index indicates noncompressible right lower extremity arteries, suggestive of likely medial arterial calcification. Left ABI: Elevated left ankle-brachial index indicates likely medial arterial calcification.  Electronically signed by Deitra Mayo MD on 11/16/2019 at 6:20:43 PM.    Final    ECHOCARDIOGRAM LIMITED  Result Date: 11/30/2019    ECHOCARDIOGRAM LIMITED REPORT   Patient Name:   William Walker Date of Exam: 11/30/2019 Medical Rec #:  [id number]     Height:       71.0 in Accession #:    [id number]    Weight:       213.0 lb Date of Birth:  01-30-1927      BSA:          2.166 m Patient Age:    23 years      BP:           130/71 mmHg Patient Gender: M             HR:           86 bpm. Exam Location:  Inpatient Procedure: Limited Echo, Limited Color Doppler and Cardiac Doppler Indications:    acute systolic chf 572.62  History:        Patient has prior history of Echocardiogram examinations, most                 recent 11/16/2019. CAD.                 Aortic Valve: bioprosthetic valve is present in the aortic  position.  Sonographer:    Johny Chess Referring Phys: [place]  1. Left ventricular ejection fraction, by estimation, is 60 to 65%. The left ventricle has  normal function. The left ventricle has no regional wall motion abnormalities. Left ventricular diastolic parameters are consistent with Grade II diastolic dysfunction (pseudonormalization). Elevated left atrial pressure.  2. Right ventricular systolic function was not well visualized. The right ventricular size is normal. There is mildly elevated pulmonary artery systolic pressure. The estimated right ventricular systolic pressure is 00.7 mmHg.  3. The mitral valve is normal in structure. Trivial mitral valve regurgitation.  4. There is no perivalvular leak. The aortic valve has been repaired/replaced. Aortic valve regurgitation is mild. There is a bioprosthetic valve present in the aortic position. Echo findings are consistent with regurgitation of the aortic prosthesis. Comparison(s): Prior images reviewed side by side. The left ventricular function has improved. The left ventricular wall motion has improved. The right ventricle is poorly seen on this study and its systolic function cannot be assessed. FINDINGS  Left Ventricle: Left ventricular ejection fraction, by estimation, is 60 to 65%. The left ventricle has normal function. The left ventricle has no regional wall motion abnormalities. The left ventricular internal cavity size was normal in size. There is  no left ventricular hypertrophy. Abnormal (paradoxical) septal motion consistent with post-operative status. Elevated left atrial pressure. Right Ventricle: The right ventricular size is normal. No increase in right ventricular wall thickness. Right ventricular systolic function was not well visualized. There is mildly elevated pulmonary artery systolic pressure. The tricuspid regurgitant velocity is 2.89 m/s, and with an assumed right atrial pressure of 10 mmHg, the estimated right ventricular systolic pressure is 62.2 mmHg. Left Atrium: Left atrial size was normal in size. Right Atrium: Right atrial size was normal in size. Pericardium: There is no  evidence of pericardial effusion. Mitral Valve: The mitral valve is normal in structure. Trivial mitral valve regurgitation. Tricuspid Valve: The tricuspid valve is normal in structure. Tricuspid valve regurgitation is mild. Aortic Valve: There is no perivalvular leak. The aortic valve has been repaired/replaced. Aortic valve regurgitation is mild. Aortic valve mean gradient measures 7.0 mmHg. Aortic valve peak gradient measures 15.7 mmHg. Aortic valve area, by VTI measures 1.06 cm. There is a bioprosthetic valve present in the aortic position. Pulmonic Valve: The pulmonic valve was not well visualized. Pulmonic valve regurgitation is not visualized. Aorta: The aortic root is normal in size and structure. Venous: The inferior vena cava was not well visualized. IAS/Shunts: No atrial level shunt detected by color flow Doppler.  LEFT VENTRICLE PLAX 2D LVIDd:         4.60 cm     Diastology LVIDs:         3.00 cm     LV e' lateral:   6.74 cm/s LV PW:         1.10 cm     LV E/e' lateral: 13.9 LV IVS:        1.00 cm     LV e' medial:    4.35 cm/s LVOT diam:     1.50 cm     LV E/e' medial:  21.5 LV SV:         28 LV SV Index:   13 LVOT Area:     1.77 cm  LV Volumes (MOD) LV vol d, MOD A2C: 69.5 ml LV vol d, MOD A4C: 62.6 ml LV vol s, MOD A2C: 33.0 ml LV vol s, MOD A4C:  33.0 ml LV SV MOD A2C:     36.5 ml LV SV MOD A4C:     62.6 ml LV SV MOD BP:      34.1 ml LEFT ATRIUM         Index LA diam:    3.20 cm 1.48 cm/m  AORTIC VALVE AV Area (Vmax):    0.84 cm AV Area (Vmean):   0.83 cm AV Area (VTI):     1.06 cm AV Vmax:           198.00 cm/s AV Vmean:          120.000 cm/s AV VTI:            0.264 m AV Peak Grad:      15.7 mmHg AV Mean Grad:      7.0 mmHg LVOT Vmax:         94.20 cm/s LVOT Vmean:        56.700 cm/s LVOT VTI:          0.158 m LVOT/AV VTI ratio: 0.60 MITRAL VALVE               TRICUSPID VALVE MV Area (PHT): 3.53 cm    TR Peak grad:   33.4 mmHg MV Decel Time: 215 msec    TR Vmax:        289.00 cm/s MV E  velocity: 93.60 cm/s MV A velocity: 83.20 cm/s  SHUNTS MV E/A ratio:  1.12        Systemic VTI:  0.16 m                            Systemic Diam: 1.50 cm Dani Gobble Croitoru MD Electronically signed by Sanda Klein MD Signature Date/Time: 11/30/2019/2:00:40 PM    Final    [place] EKG SITE RITE  Result Date: 11/25/2019 If Site Rite image not attached, placement could not be confirmed due to current cardiac rhythm.  [place] EKG SITE RITE  Result Date: 11/25/2019 If Site Rite image not attached, placement could not be confirmed due to current cardiac rhythm.   Labs:  Basic Metabolic Panel: Recent Labs  Lab 12/09/19 0523 12/10/19 0431 12/11/19 0335 12/12/19 0326 12/13/19 0409 12/14/19 0304 12/15/19 0723  NA 139 140 137 139 140 138 138  K 3.5 3.9 4.0 3.8 3.9 3.4* 4.0  CL 99 100 97* 98 98 96* 98  CO2 29 31 28 30 27  32 29  GLUCOSE 85 103* 105* 125* 117* 102* 130*  BUN 31* 34* 41* 39* 41* 37* 38*  CREATININE 1.51* 1.45* 1.44* 1.51* 1.57* 1.49* 1.45*  CALCIUM 8.7* 8.8* 8.7* 8.7* 8.7* 8.6* 8.7*  MG  --  2.2  --   --   --   --   --     CBC: Recent Labs  Lab 12/15/19 0723  WBC 5.4  NEUTROABS 3.0  HGB 10.0*  HCT 31.4*  MCV 102.6*  PLT 182    CBG: Recent Labs  Lab 12/14/19 2103 12/15/19 0620 12/15/19 1210 12/15/19 1629 12/15/19 2113  GLUCAP 201* 126* 123* 150* 127*   Family history.  Father with CAD and hyperlipidemia.  Denies any colon cancer esophageal cancer or rectal cancer  Brief HPI:   William Walker is a 86 y.o. right-handed male with history of severe calcified aortic stenosis, chronic exertional angina, multivessel CAD, hypertension, hyperlipidemia, obesity with BMI 29.28 and type 2 diabetes mellitus.  Patient lives with spouse two-level home.  Independent with assistive device.  He has a son who lives next door.  Presented 11/15/2019 with exertional chest pain.  Patient had been followed by cardiology service as an outpatient.  Chest x-ray no active disease.  EKG with normal  sinus rhythm with first-degree AV block with ST segment depressions in the lateral and inferior leads that showed changes from prior tracings.  Troponin 4003.  He was loaded with Plavix.  Underwent cardiac catheterization showing severe CAD.  Patient underwent aortic valve replacement with CABG x4 11/22/2019 per Dr. Servando Snare.  Postoperative pain managed with sternal precautions.  Postoperative cardiotomy shock he was on milrinone for a short time.  Follow-up echocardiogram showed ejection fraction 43% grade 2 diastolic dysfunction.  Maintained on full-strength aspirin therapy.  Subcutaneous Lovenox for DVT prophylaxis.  Acute blood loss anemia 8.8 and monitored.  He did have some AKI 1.67-1.46 and monitored.  Patient was admitted for a comprehensive rehab program.   Hospital Course: William Walker was admitted to rehab 12/07/2019 for inpatient therapies to consist of PT, ST and OT at least three hours five days a week. Past admission physiatrist, therapy team and rehab RN have worked together to provide customized collaborative inpatient rehab.  Pertaining to patient's CAD with CABG x4 bioprosthetic AVR 11/22/2019.  Sternal precautions as advised surgical site healing nicely.  He would follow cardiothoracic surgery.  He remained on full-strength aspirin therapy.  No chest pain or shortness of breath.  Lovenox for DVT prophylaxis.  Pain managed with use of oxycodone on a limited basis.  Acute blood loss anemia please hemoglobin 9.0 asymptomatic.  Blood pressures monitored closely the remained somewhat soft.  Blood sugars controlled hemoglobin A1c of 7.4.  AKI on CKD latest creatinine 1.49 monitored encouragement of fluids.  He did have some urinary frequency bladder scans unremarkable encouraged routine toileting.   Blood pressures were monitored on TID basis and soft and monitored  Diabetes has been monitored with ac/hs CBG checks and SSI was use prn for tighter BS control.    Rehab course: During patient's  stay in rehab weekly team conferences were held to monitor patient's progress, set goals and discuss barriers to discharge. At admission, patient required minimal assist 180 feet rolling walker, moderate assist sit to side-lying moderate assist sit to supine.  Moderate assist upper body bathing total assist lower body bathing moderate assist upper body dressing +2 physical assist lower body dressing  Physical exam.  Blood pressure 118/70 pulse 80 temperature 98 respirations 18 oxygen saturation 90% room air Constitutional.  Alert and oriented HEENT Head.  Normocephalic and atraumatic Eyes.  Pupils round and reactive to light no discharge without nystagmus Neck.  Supple nontender no JVD without thyromegaly Cardiac regular rate rhythm without any extra sounds or murmur heard Abdomen.  Soft nontender positive bowel sounds Respiratory.  Effort normal no respiratory distress without wheeze Extremities.  No clubbing cyanosis or edema Neuro.  Alert oriented makes good eye contact with examiner moves all extremities 4/5.  He/  has had improvement in activity tolerance, balance, postural control as well as ability to compensate for deficits. He/ has had improvement in functional use RUE/LUE  and RLE/LLE as well as improvement in awareness.  Working with energy conservation techniques.  Ambulating 200 feet with rest breaks.  Some verbal cues for upper gaze.  Perform stand step wheelchair with rolling walker supervision.  Patient stands at the sink for several minutes with alternating upper extremity support at times.  He was able to ambulate  back to his room 200 feet after sessions.  Upper body dressing sitting in recliner with supervision lower body dressing contact-guard assist.  Full family teaching completed plan discharged home       Disposition: Discharge to home    Diet: Soft with diabetic restrictions  Special Instructions: No driving smoking or alcohol  Sternal precautions  Medications  at discharge 1.  Aspirin 325 mg p.o. daily 2.  Calcium citrate 200 mg p.o. daily 3.  Colace 200 mg p.o. daily 4.  Glucotrol XL 5 mg p.o. daily 5.  Synthroid 50 mcg p.o. daily 6.  ProAmatine 10 mg p.o. 3 times daily 7.  Protonix 40 mg p.o. daily 8.  Crestor 10 mg p.o. daily 9.  Demadex 40 mg daily at 08 120 mg daily at 1700-hour 10.  Ultram 50 mg every 6 hours as needed pain  30-35 minutes were spent completing discharge summary and discharge planning  Discharge Instructions    Ambulatory Referral to Neuro Rehab   Complete by: As directed    PT/OT eval and treat       Follow-up Information    Lovorn, Jinny Blossom, MD Follow up.   Specialty: Physical Medicine and Rehabilitation Why: No follow-up required Contact information: [address] [place] [address] [phone]        Satira Sark, MD Follow up.   Specialty: Cardiology Why: Call for appointment Contact information: [place] [address] [phone]        [name], MD Follow up.   Specialty: Cardiothoracic Surgery Why: Call for appointment Contact information: [address] [place] [address] [phone]               Signed: Cathlyn Parsons 12/16/2019, 4:45 AM

## 2019-12-14 NOTE — Progress Notes (Signed)
Occupational Therapy Session Note  Patient Details  Name: William Walker MRN: [id number] Date of Birth: 05-Sep-1926  Today's Date: 12/14/2019 OT Individual Time: 1449-1535 OT Individual Time Calculation (min): 46 min    Short Term Goals: Week 1:  OT Short Term Goal 1 (Week 1): STGs=LTGs due to ELOS  Skilled Therapeutic Interventions/Progress Updates:    Pt greeted at time of session with daughter [name] present and remained throughout session. No c/o pain throughout. Pt did need to urinate, stood twice throughout session sit to stand supervision with adaptive technique while maintaining sternal precautions and static stand with one or no UE support and performed toileting with supervision. Reviewed with daughter and pt for clothing choices at home for ease of urinating d/t frequency. Ambulated short distance to wheelchair with RW with supervision and brought to apartment for time management, performed transfer to tub/shower with use of grab bars with CGA-supervision after therapist demonstration and reviewed ways to make transfer safer. Pt brought back to room via wheelchair for time management and ambulated to chair supervision, set up with alarm on, call bell in reach, all needs met. Pt is aware of DC on Thursday.   Therapy Documentation Precautions:  Precautions Precautions: Fall, Sternal Precaution Comments: educated in sternal precautions Restrictions Weight Bearing Restrictions: No    Therapy/Group: Individual Therapy  [name] 12/14/2019, 3:47 PM

## 2019-12-14 NOTE — Progress Notes (Signed)
Patient ID: William Walker, male   DOB: 08-28-1926, 86 y.o.   MRN: [id number]  SW met with pt and [name] in room to provide updates from team conference, and d/c date 7/15 with recommendations for outpatient therapies for PT/OT. Pt amenable to therapies at [facility] rehab. His dtr mentioned that pt will need a RW. SW informed will confirm with therapy. States they will get all other DME needs he requires. Confirms their mother will provide 24/7 care and he will have support from his 4 adult children.   Loralee Pacas, MSW, [place] Office: [phone] Cell: [phone] Fax: [phone]

## 2019-12-14 NOTE — Progress Notes (Signed)
Visit to pt to remove PICC line. Therapist in room; states pt will have a break at 1:30 pm. Will return after that time for PICC removal.

## 2019-12-14 NOTE — Progress Notes (Signed)
Occupational Therapy Session Note  Patient Details  Name: William Walker MRN: [id number] Date of Birth: Aug 30, 1926  Today's Date: 12/14/2019 OT Individual Time: 3383-2919 OT Individual Time Calculation (min): 54 min    Short Term Goals: Week 1:  OT Short Term Goal 1 (Week 1): STGs=LTGs due to ELOS  Skilled Therapeutic Interventions/Progress Updates:    Pt sitting in recliner stating "Im making water right now" upon OT arrival.  Pt with no c/o pain, however he reports frequent episodes of urination needing to use small bucket to retain due to swollen genitals.  Pt able to complete clothing mgt and mgt of bucket with mod I in sitting.  Pt requesting to brush his teeth.  Mod assist to complete sit to stand at RW while holding sternal pillow with BUE.  Pt completed functional ambulation to sink with supervision.  Pt brushed teeth in standing needing on supervision.  Pt returned to recliner and completed stand to sit with supervision.  Pts dtr arrived and OT collaborated with pt and dtr regarding DME needs upon dc.  Pts dtr reporting need for RW due to current one at home does not have complete parts.  OT made attending PT aware.  Pt completed sit to stand second time using BUE close to body with CGA.  Pt ambulated to bathroom and completed toilet sit<>stand with CGA and 3/3 toileting tasks with min assist.  Pt returned to recliner with supervision.  OT made nursing aware of pts continence of urine episode.  OT to clarify sternal precautions with MD.  Pt with nursing upon departure.  Therapy Documentation Precautions:  Precautions Precautions: Fall, Sternal Precaution Comments: educated in sternal precautions Restrictions Weight Bearing Restrictions: No   Therapy/Group: Individual Therapy  Ezekiel Slocumb 12/14/2019, 4:52 PM

## 2019-12-14 NOTE — Progress Notes (Signed)
Physical Therapy Session Note  Patient Details  Name: William Walker MRN: [id number] Date of Birth: 12/03/1926  Today's Date: 12/14/2019 PT Individual Time: 0920-1017 and 1219-7588 PT Individual Time Calculation (min): 57 min and 25 min  Short Term Goals: Week 1:  PT Short Term Goal 1 (Week 1): STGs = LTGs due to ELOS  Skilled Therapeutic Interventions/Progress Updates:     1st Session: Pt received supine in bed and agreeable to therapy. Reports no pain. Supine to sit with cues for logrolling technique. Sit to stand and stand step transfer to [facility] with RW and cues for body mechanics and positioning. Pt ambulates 200' with RW, cues for upright gaze to improve posture and balance and to decrease WB through RW for energy conservation. Pt ambulates with forward flexed posture and slightly flexed knees during stance phase. Pt performs car transfer and ramp navigation with cues for sequencing for safety. Extended seated rest break due to fatigue, then pt perform x4 steps x2 bouts, with RHR and close supervision for safety. Pt ambulates 100' with SPC to challenge dynamic balance and demos increased unsteadiness, but no overt LOBs. Pt ambulates additional 150' and left seated in recliner with alarm intact and all needs within reach.  2nd Session: Received seated in recliner and agreeable to therapy. No pain. Pt ambulates 150' with RW and close supervision with cues for increased proximity to RW for safety. Pt practices sit to stand transfers without use of BUEs to increase challenge and strengthen core and BLEs. Pt requires increased verbal cuing for technique and minA to facilitate anterior weight shift. x5 reps total with seated rest breaks. Pt ambulates 150' back to room with RW. Left seated in recliner with all needs within reach.  Therapy Documentation Precautions:  Precautions Precautions: Fall, Sternal Precaution Comments: educated in sternal precautions Restrictions Weight Bearing Restrictions:  No    Therapy/Group: Individual Therapy  Breck Coons, PT, DPT 12/14/2019, 4:07 PM

## 2019-12-14 NOTE — Progress Notes (Signed)
Call to RN regarding order for PICC line removal. Pt up with therapist; RN requests PICC removal after pt back in room.

## 2019-12-14 NOTE — Progress Notes (Addendum)
[address]       [place] [address]             [phone]       Subjective: Met with Mr. Derousse and his daughter this afternoon. They are pleased with his progress and plans to transition to home later this week.  Primary complaint is frequent voiding which he was dealing with prior to admission and he says is related to his long-standing hernia.   -PICC removed today. -Unna boots re-wrapped today.  -Post void residuals measured by bladder scan less than 165m last PM and 038m   today.  Objective: Vital signs in last 24 hours: Temp:  [98.4 F (36.9 C)-98.7 F (37.1 C)] 98.4 F (36.9 C) (07/13 0631) Pulse Rate:  [73-78] 73 (07/13 0631) Resp:  [16] 16 (07/13 0631) BP: (111-122)/(60-62) 111/60 (07/13 0631) SpO2:  [92 %-97 %] 92 % (07/13 0631) Weight:  [91.3 kg] 91.3 kg (07/13 0500)     Intake/Output from previous day: 07/12 0701 - 07/13 0700 In: 830 [P.O.:820; I.V.:10] Out: 150 [Urine:150] Intake/Output this shift: Total I/O In: -  Out: 50 [Urine:50]  General appearance: alert, cooperative and no distress Neurologic: intact Heart: regular rate and rhythm Lungs: Breath sounds clear and appears comfortable on RA.  Abdomen: soft,NT Extremities: Unna boots in place, minimal pretibial edema. Moderate edema in both feet. Wound: the sternotomy incision is well healed and stable.   Lab Results: No results for input(s): WBC, HGB, HCT, PLT in the last 72 hours. BMET:  Recent Labs    12/13/19 0409 12/14/19 0304  NA 140 138  K 3.9 3.4*  CL 98 96*  CO2 27 32  GLUCOSE 117* 102*  BUN 41* 37*  CREATININE 1.57* 1.49*  CALCIUM 8.7* 8.6*    PT/INR: No results for input(s): LABPROT, INR in the last 72 hours. ABG    Component Value Date/Time   PHART 7.323 (L) 11/23/2019 0211   HCO3 18.4 (L) 11/23/2019 0211   TCO2 19 (L) 11/23/2019 0211   ACIDBASEDEF 7.0 (H) 11/23/2019 0211   O2SAT 61.3 12/07/2019 0450   CBG (last 3)  Recent Labs     12/13/19 2105 12/14/19 0603 12/14/19 1200  GLUCAP 149* 108* 133*    Assessment/Plan:  Continues to make a slow but progressive recovery post CABG x 4 and bioprosthetic aortic valve replacement for MVCAD and severe aortic stenosis on 11/22/19.  Post-op course complicated by cardiogenic shock and acute kidney injury.  His mobility and endurance have improved further after transfer to CIR on 12/07/19. Renal function is stable near baseline. BP stable in 110-150 range over the past 48 hours. He remains on torsemide twice daily and midodrine.  Discharge to home planned for 12/16/19.  He has follow up scheduled with Dr. GeServando Snaren 12/30/19 at 1213.  I will contact the advanced heart failure team to alert them of planned discharge so medications may be reviewed.    LOS: 7 days    MyAntony OdeaPAHershal [phone]/13/2021  I have seen and examined GeNilda Simmernd agree with the above assessment  and plan.  EdGrace IsaacD Beeper [phone] [phone]/13/2021 6:56 PM

## 2019-12-14 NOTE — Progress Notes (Signed)
[facility] PHYSICAL MEDICINE & REHABILITATION PROGRESS NOTE   Subjective/Complaints:  Pt asking if should change uniboots- explained to nursing it's time- it's been 1 week- also pt reports 2 BMs yesterday and 1 this AM- notes although voids a lot, hard to get stream started- no bladder scans done-   D/w nursing to do PVR's, regardless after he voids- so will see if retaining.    ROS:   Pt denies SOB, abd pain, CP, N/V/C/D, and vision changes   Objective:   No results found. No results for input(s): WBC, HGB, HCT, PLT in the last 72 hours. Recent Labs    12/13/19 0409 12/14/19 0304  NA 140 138  K 3.9 3.4*  CL 98 96*  CO2 27 32  GLUCOSE 117* 102*  BUN 41* 37*  CREATININE 1.57* 1.49*  CALCIUM 8.7* 8.6*    Intake/Output Summary (Last 24 hours) at 12/14/2019 1006 Last data filed at 12/14/2019 0600 Gross per 24 hour  Intake 472 ml  Output 150 ml  Net 322 ml     Physical Exam: Vital Signs Blood pressure 111/60, pulse 73, temperature 98.4 F (36.9 C), resp. rate 16, height 5\' 11"  (1.803 m), weight 91.3 kg, SpO2 92 %.  Constitutional: No distress . Vital signs reviewed.sitting up in bed; RN giving meds; appropriate, NAD HENT: Normocephalic.  Atraumatic. Eyes: conjugate gaze Cardiovascular: RRR Respiratory: CTA B/L- no W/R/R- good air movement GI: Soft, NT, ND, (+)BS  Skin: Warm and dry.  Intact. Lower extremities wrapped in uniboots- L>R feet more swollen than legs- 2-3+ feet edema; 1-2+ leg edema, b/l- no change- need to be replaced/uniboots Psych:vague/appropriate- no change Musc: Lower extremity edema Neuro: Alert HOH Motor: Grossly 4+/5 throughout, unchanged  Assessment/Plan: 1. Functional deficits secondary to cardiac debility which require 3+ hours per day of interdisciplinary therapy in a comprehensive inpatient rehab setting.  Physiatrist is providing close team supervision and 24 hour management of active medical problems listed below.  Physiatrist and  rehab team continue to assess barriers to discharge/monitor patient progress toward functional and medical goals  Care Tool:  Bathing    Body parts bathed by patient: Right arm, Left arm, Chest, Abdomen, Face   Body parts bathed by helper: Buttocks     Bathing assist Assist Level: Minimal Assistance - Patient > 75%     Upper Body Dressing/Undressing Upper body dressing   What is the patient wearing?: Pull over shirt    Upper body assist Assist Level: Supervision/Verbal cueing    Lower Body Dressing/Undressing Lower body dressing      What is the patient wearing?: Pants, Underwear/pull up     Lower body assist Assist for lower body dressing: Contact Guard/Touching assist     Toileting Toileting    Toileting assist Assist for toileting: Minimal Assistance - Patient > 75%     Transfers Chair/bed transfer  Transfers assist     Chair/bed transfer assist level: Supervision/Verbal cueing     Locomotion Ambulation   Ambulation assist      Assist level: Supervision/Verbal cueing Assistive device: Walker-rolling Max distance: 200'   Walk 10 feet activity   Assist     Assist level: Supervision/Verbal cueing Assistive device: Walker-rolling   Walk 50 feet activity   Assist    Assist level: Supervision/Verbal cueing Assistive device: Walker-rolling    Walk 150 feet activity   Assist    Assist level: Supervision/Verbal cueing Assistive device: Walker-rolling    Walk 10 feet on uneven surface  activity  Assist     Assist level: Contact Guard/Touching assist Assistive device: Walker-rolling   Wheelchair     Assist Will patient use wheelchair at discharge?: No             Wheelchair 50 feet with 2 turns activity    Assist            Wheelchair 150 feet activity     Assist          Blood pressure 111/60, pulse 73, temperature 98.4 F (36.9 C), resp. rate 16, height 5\' 11"  (1.803 m), weight 91.3 kg, SpO2  92 %.  Medical Problem List and Plan: 1.  Debility secondary to severe multivessel CAD plus critical AS.  Status post CABG x4 with bioprosthetic AVR 11/22/2019.  Sternal precautions.  Continue CIR 2.  Antithrombotics: -DVT/anticoagulation: Lovenox             -antiplatelet therapy: Aspirin 325 mg daily 3. Pain Management: Oxycodone as needed- not using, discontinued 4. Mood: Provide emotional support             -antipsychotic agents: N/A 5. Neuropsych: This patient is capable of making decisions on his own behalf. 6. Skin/Wound Care: Routine skin checks.  Patient does have bilateral Unna boots in place. 7. Fluids/Electrolytes/Nutrition: Routine in and outs.  8.  Acute blood loss anemia with macrocytosis.  Follow-up CBCs weekly.   Hemoglobin 9.0 on 7/7  Vitamin B12 reviewed borderline low-supplement initiated on 7/10 9.  Post cardiotomy shock.  Continue ProAmatine as directed as well as Demadex.  Monitor for any signs of fluid overload 10.  Diabetes mellitus with hyperglycemia.  Hemoglobin A1c 7.4.  SSI.  Patient on Glucotrol 5 mg daily prior to admission and resume as needed in place of insulin.              Decreased Levemir to 15U.   Changed to Novolog sensitive scale.   Labile on 7/11, will consider transition to oral hypoglycemic agent  7/12- decrease Levemir to 5 units and add back Glucotrol 5 mg XL from home  7/13- BGs 108-160- doing well- hopefully, can get off Insulin in next few days 11.  Hyperlipidemia.  Crestor 12.  Hypothyroidism.  Synthroid 13.  Constipation.  Continue Colace and Dulcolax tablet as directed. 14. Hypocalcemia: Supplement started.  15. Hypokalemia:   Continue 69meq BID   Potassium 4.2 on 7/10  7/12- K+ 3.9  7/13- K+ 3.4- will replete with 40 mEq of KCl 16. AKI on CKD:   Creatinine 1.44 on 7/10  7/12- Cr 1.57- creeping up- likely torsemide dose- will check with Cards about dosing.   7/13- Cr down slightly to 1.49- will push PO  Continue to monitor  17.  Urinary frequency  7/12- will check bladder scans ot make sure emptying.   7/13- will verify with nursing to do- not documented  18. Will remove PICC- place order and change uniboots   LOS: 7 days A FACE TO FACE EVALUATION WAS PERFORMED  William Walker 12/14/2019, 10:05 AM

## 2019-12-14 NOTE — Plan of Care (Signed)
  Problem: Consults Goal: RH GENERAL PATIENT EDUCATION Description: See Patient Education module for education specifics. Outcome: Progressing   Problem: RH SKIN INTEGRITY Goal: RH STG MAINTAIN SKIN INTEGRITY WITH ASSISTANCE Description: STG Maintain Skin Integrity With mod I assist Outcome: Progressing   Problem: RH SAFETY Goal: RH STG ADHERE TO SAFETY PRECAUTIONS W/ASSISTANCE/DEVICE Description: STG Adhere to Safety Precautions With cues and reminders Outcome: Progressing   Problem: RH PAIN MANAGEMENT Goal: RH STG PAIN MANAGED AT OR BELOW PT'S PAIN GOAL Description: Pain level less than 4 on scale of 0-10 Outcome: Progressing   Problem: RH KNOWLEDGE DEFICIT GENERAL Goal: RH STG INCREASE KNOWLEDGE OF SELF CARE AFTER HOSPITALIZATION Description: Pt will be able to adhere to medication regimen and dietary restriction to prevent complications related to CABG with mod I assist. Pt will demonstrate understanding of activity restrictions after CABG with mod I assist.  Outcome: Progressing

## 2019-12-15 ENCOUNTER — Inpatient Hospital Stay (HOSPITAL_COMMUNITY): Payer: Medicare Other

## 2019-12-15 ENCOUNTER — Inpatient Hospital Stay (HOSPITAL_COMMUNITY): Payer: Medicare Other | Admitting: Occupational Therapy

## 2019-12-15 LAB — CBC WITH DIFFERENTIAL/PLATELET
Abs Immature Granulocytes: 0.01 10*3/uL (ref 0.00–0.07)
Basophils Absolute: 0.1 10*3/uL (ref 0.0–0.1)
Basophils Relative: 1 %
Eosinophils Absolute: 0.3 10*3/uL (ref 0.0–0.5)
Eosinophils Relative: 5 %
HCT: 31.4 % — ABNORMAL LOW (ref 39.0–52.0)
Hemoglobin: 10 g/dL — ABNORMAL LOW (ref 13.0–17.0)
Immature Granulocytes: 0 %
Lymphocytes Relative: 29 %
Lymphs Abs: 1.6 10*3/uL (ref 0.7–4.0)
MCH: 32.7 pg (ref 26.0–34.0)
MCHC: 31.8 g/dL (ref 30.0–36.0)
MCV: 102.6 fL — ABNORMAL HIGH (ref 80.0–100.0)
Monocytes Absolute: 0.5 10*3/uL (ref 0.1–1.0)
Monocytes Relative: 10 %
Neutro Abs: 3 10*3/uL (ref 1.7–7.7)
Neutrophils Relative %: 55 %
Platelets: 182 10*3/uL (ref 150–400)
RBC: 3.06 MIL/uL — ABNORMAL LOW (ref 4.22–5.81)
RDW: 17.4 % — ABNORMAL HIGH (ref 11.5–15.5)
WBC: 5.4 10*3/uL (ref 4.0–10.5)
nRBC: 0 % (ref 0.0–0.2)

## 2019-12-15 LAB — BASIC METABOLIC PANEL
Anion gap: 11 (ref 5–15)
BUN: 38 mg/dL — ABNORMAL HIGH (ref 8–23)
CO2: 29 mmol/L (ref 22–32)
Calcium: 8.7 mg/dL — ABNORMAL LOW (ref 8.9–10.3)
Chloride: 98 mmol/L (ref 98–111)
Creatinine, Ser: 1.45 mg/dL — ABNORMAL HIGH (ref 0.61–1.24)
GFR calc Af Amer: 48 mL/min — ABNORMAL LOW (ref >60)
GFR calc non Af Amer: 42 mL/min — ABNORMAL LOW (ref >60)
Glucose, Bld: 130 mg/dL — ABNORMAL HIGH (ref 70–99)
Potassium: 4 mmol/L (ref 3.5–5.1)
Sodium: 138 mmol/L (ref 135–145)

## 2019-12-15 LAB — GLUCOSE, CAPILLARY
Glucose-Capillary: 126 mg/dL — ABNORMAL HIGH (ref 70–99)
Glucose-Capillary: 123 mg/dL — ABNORMAL HIGH (ref 70–99)
Glucose-Capillary: 150 mg/dL — ABNORMAL HIGH (ref 70–99)
Glucose-Capillary: 127 mg/dL — ABNORMAL HIGH (ref 70–99)

## 2019-12-15 MED ORDER — MIDODRINE HCL 10 MG PO TABS: 10.0000 mg | ORAL_TABLET | Freq: Three times a day (TID) | ORAL | 0 refills | Status: DC

## 2019-12-15 MED ORDER — TRAMADOL HCL 50 MG PO TABS: 50.0000 mg | ORAL_TABLET | Freq: Four times a day (QID) | ORAL | 0 refills | Status: AC | PRN

## 2019-12-15 MED ORDER — PANTOPRAZOLE SODIUM 40 MG PO TBEC: 40.0000 mg | DELAYED_RELEASE_TABLET | Freq: Every day | ORAL | 0 refills | Status: AC

## 2019-12-15 MED ORDER — CYANOCOBALAMIN 250 MCG PO TABS: 250.0000 ug | ORAL_TABLET | Freq: Every day | ORAL | 0 refills | Status: DC

## 2019-12-15 MED ORDER — DOCUSATE SODIUM 100 MG PO CAPS: 200.0000 mg | ORAL_CAPSULE | Freq: Every day | ORAL | 0 refills | Status: DC

## 2019-12-15 MED ORDER — LEVOTHYROXINE SODIUM 50 MCG PO TABS: 50.0000 ug | ORAL_TABLET | Freq: Every day | ORAL | 0 refills | Status: DC

## 2019-12-15 MED ORDER — ROSUVASTATIN CALCIUM 10 MG PO TABS: 10.0000 mg | ORAL_TABLET | Freq: Every day | ORAL | 0 refills | Status: DC

## 2019-12-15 MED ORDER — GLIPIZIDE 5 MG PO TABS: 5.0000 mg | ORAL_TABLET | Freq: Every day | ORAL | 0 refills | Status: DC

## 2019-12-15 MED ORDER — CALCIUM CITRATE 950 (200 CA) MG PO TABS: 200.0000 mg | ORAL_TABLET | Freq: Every day | ORAL | 0 refills | Status: DC

## 2019-12-15 MED ORDER — POTASSIUM CHLORIDE CRYS ER 20 MEQ PO TBCR: 40.0000 meq | EXTENDED_RELEASE_TABLET | Freq: Every day | ORAL | 0 refills | Status: DC

## 2019-12-15 MED ORDER — TORSEMIDE 20 MG PO TABS: ORAL_TABLET | ORAL | 0 refills | Status: DC

## 2019-12-15 NOTE — Discharge Instructions (Signed)
Inpatient Rehab Discharge Instructions  SPYROS WINCH Discharge date and time: No discharge date for patient encounter.   Activities/Precautions/ Functional Status: Activity: Sternal precautions Diet: Soft Wound Care: keep wound clean and dry Functional status:  ___ No restrictions     ___ Walk up steps independently ___ 24/7 supervision/assistance   ___ Walk up steps with assistance ___ Intermittent supervision/assistance  ___ Bathe/dress independently ___ Walk with walker     _x__ Bathe/dress with assistance ___ Walk Independently    ___ Shower independently ___ Walk with assistance    ___ Shower with assistance ___ No alcohol     ___ Return to work/school ________  COMMUNITY REFERRALS UPON DISCHARGE:     Outpatient: PT    OT                 Agency: [facility] Phone: [phone]              Appointment Date/Time:*Please expect follow-up within 7-10 business days to schedule your appointment. If you have not received, follow-up, be sure to contact the site directly.*  Medical Equipment/Items Ordered: Rolling walker                                                  Agency/Supplier: [employer] [phone]   Special Instructions: No driving smoking or alcohol   My questions have been answered and I understand these instructions. I will adhere to these goals and the provided educational materials after my discharge from the hospital.  Patient/Caregiver Signature _______________________________ Date __________  Clinician Signature _______________________________________ Date __________  Please bring this form and your medication list with you to all your follow-up doctor's appointments.

## 2019-12-15 NOTE — Progress Notes (Signed)
Orthopedic Tech Progress Note Patient Details:  CHON BUHL 1926/10/17 [id number]  Ortho Devices Type of Ortho Device: Haematologist Ortho Device/Splint Location: BLE Ortho Device/Splint Interventions: Application   Post Interventions Patient Tolerated: Well Instructions Provided: Care of device   Analilia Geddis 12/15/2019, 11:20 AM

## 2019-12-15 NOTE — Patient Care Conference (Signed)
Inpatient RehabilitationTeam Conference and Plan of Care Update Date: 12/15/2019   Time: 8:31 AM    Patient Name: William Walker      Medical Record Number: [id number]  Date of Birth: 1926-11-23 Sex: Male         Room/Bed: 4W19C/4W19C-01 Payor Info: Payor: [place] / Plan: [employer] MEDICARE / Product Type: *No Product type* /    Admit Date/Time:  12/07/2019  5:55 PM  Primary Diagnosis:  [facility] Problems: Principal Problem:   Debility Active Problems:   AKI (acute kidney injury) ([place])   Hypokalemia   Controlled type 2 diabetes mellitus with hyperglycemia, without long-term current use of insulin ([place])   Labile blood glucose   Acute blood loss anemia    Expected Discharge Date: Expected Discharge Date: 12/16/19  Team Members Present: Physician leading conference: Dr. Courtney Heys Care Coodinator Present: Loralee Pacas, LCSWA;Shawntel Farnworth Creig Hines, RN, BSN, CRRN Nurse Present: Renda Rolls, LPN PT Present: Tereasa Coop, PT OT Present: Lillia Corporal, OT PPS Coordinator present : Ileana Ladd, Burna Mortimer, SLP     Current Status/Progress Goal Weekly Team Focus  Bowel/Bladder   continent with bowel, incontinent with bladder in bed; last BM 7/11  Become continent; prevent skin breakdown  Assess every shift and as needed; frequent toileting   Swallow/Nutrition/ Hydration             ADL's   CGA-Min for LB dressing/bathing but able to use compensatory techniques, CGA for ADL transfers, Supervision for UB with cues for sternal precautions, sit to stands with compensatory technique for precautions  Supervision  toilet transfers maintaining sternal precautions, LB ADLs   Mobility   supervision bed mobility, transfers, gait up to 200' with RW, 4 steps with BHr  supervision  DC prep, stair training   Communication             Safety/Cognition/ Behavioral Observations            Pain   no pain reported during shift  pain <2  assess every shift and as needed    Skin   surgical incision, bruising, MASD  prevent skin breakdown  assess every shift and as needed; peri care as needed; repositioning     Team Discussion:  Discharge Planning/Teaching Needs:  D/c to home with his wife and support from his adult children.  Family education as recommended by therapy.   Current Update:  None  Current Barriers to Discharge:  Medical stability and pain.  Possible Resolutions to Barriers: MD has ordered labs and waiting on labs to improve, continue pain medication regimen.  Patient on target to meet rehab goals: yes, continent with incontinent episodes. Pt is functionally ready for discharge at a supervision level.   *See Care Plan and progress notes for long and short-term goals.   Revisions to Treatment Plan:  PICC line to be removed, una boots need to be changed.    Medical Summary Current Status: incontinent at times- due to stream issues- concerned about Lasix; LBM= 7/12 Weekly Focus/Goal: PT- supervision 249ft RW- less steady with cane- can go when medically ready- d/c 7/15  Barriers to Discharge: Wound care;Other (comments);Home enviroment access/layout;Decreased family/caregiver support;Incontinence;Medical stability  Barriers to Discharge Comments: edema control; remove PICC; replete K+- Cr elevated, labs in AM Possible Resolutions to Barriers: OT_ superviison level- is near goal- daughters Nurse and NP   Continued Need for Acute Rehabilitation Level of Care: The patient requires daily medical management by a physician with specialized training in  physical medicine and rehabilitation for the following reasons: Direction of a multidisciplinary physical rehabilitation program to maximize functional independence : Yes Medical management of patient stability for increased activity during participation in an intensive rehabilitation regime.: Yes Analysis of laboratory values and/or radiology reports with any subsequent need for medication  adjustment and/or medical intervention. : Yes   I attest that I was present, lead the team conference, and concur with the assessment and plan of the team.   Cristi Loron 12/15/2019, 8:31 AM

## 2019-12-15 NOTE — Progress Notes (Signed)
Physical Therapy Discharge Summary  Patient Details  Name: William Walker MRN: [id number] Date of Birth: 09-27-1926  Today's Date: 12/15/2019 PT Individual Time: 4315-4008 PT Individual Time Calculation (min): 57 min    Patient has met 8 of 8 long term goals due to improved activity tolerance, improved balance and increased strength.  Patient to discharge at an ambulatory level Supervision.   Patient's care partner is independent to provide the necessary physical assistance at discharge.  Reasons goals not met: NA  Recommendation:  Patient will benefit from ongoing skilled PT services in outpatient setting to continue to advance safe functional mobility, address ongoing impairments in strength, balance, ambulation, endurance, and minimize fall risk.  Equipment: RW  Reasons for discharge: treatment goals met and discharge from hospital  Patient/family agrees with progress made and goals achieved: Yes   Skilled Therapeutic Interventions:  Pt received supine. Agreeable to therapy. No pain. Supine to sit independently. Pt reports brief is wet and PT assists with changing into new brief. Pt stands several times with supervision for safety and maintains standing with alternating UE and LE support to don new brief and shorts. WC transport to gym for time management.  Pt performs x8 steps with RHR and close supervision from PT for safety. Verbal cues given for positioning and technique. Following seated rest break, pt ambulates 300' with RW, with verbal cues for upright gaze to improve posture and balance, and positioning for safety.   Pt performs NMR for standing balance, using Wii golf game to practice standing without UE support performing dynamic movements. PT provide CGA for safety.  Pt left seated in recliner with alarm intact and all needs within reach.  PT Discharge Precautions/Restrictions Precautions Precautions: Fall;Sternal Restrictions Weight Bearing Restrictions:  No Pain Pain Assessment Pain Scale: (P) 0-10 Pain Score: (P) 0-No pain Vision/Perception  Perception Perception: Within Functional Limits Praxis Praxis: Intact  Cognition Overall Cognitive Status: Within Functional Limits for tasks assessed Arousal/Alertness: Awake/alert Orientation Level: Oriented X4 Safety/Judgment: Appears intact Sensation Sensation Light Touch: Impaired by gross assessment Additional Comments: Light touch intact but dimished in BLEs. Baseline neuropathy. Coordination Gross Motor Movements are Fluid and Coordinated: Yes Fine Motor Movements are Fluid and Coordinated: Yes Motor  Motor Motor: Within Functional Limits  Mobility Bed Mobility Bed Mobility: Supine to Sit;Sit to Supine Rolling Left: Independent Supine to Sit: Independent Sit to Supine: Independent Transfers Transfers: Sit to Stand;Stand to Sit;Stand Pivot Transfers Sit to Stand: Supervision/Verbal cueing Stand to Sit: Supervision/Verbal cueing Stand Pivot Transfers: Supervision/Verbal cueing Stand Pivot Transfer Details: Verbal cues for technique;Verbal cues for sequencing Transfer (Assistive device): Rolling walker Locomotion  Gait Ambulation: Yes Gait Assistance: Supervision/Verbal cueing Gait Distance (Feet): 300 Feet Assistive device: Rolling walker Gait Assistance Details: Verbal cues for safe use of DME/AE;Verbal cues for technique Gait Gait: Yes Gait Pattern: Impaired Gait Pattern: Trunk flexed;Decreased step length - left;Decreased step length - right Gait velocity: decreased Stairs / Additional Locomotion Stairs: Yes Stairs Assistance: Supervision/Verbal cueing Stair Management Technique: Two rails Number of Stairs: 8 Height of Stairs: 6 Ramp: Supervision/Verbal cueing Curb: Supervision/Verbal cueing Wheelchair Mobility Wheelchair Mobility: No  Trunk/Postural Assessment  Cervical Assessment Cervical Assessment: Exceptions to [facility] (forward head) Thoracic  Assessment Thoracic Assessment: Exceptions to [facility] (rounded shoulders) Lumbar Assessment Lumbar Assessment: Exceptions to [facility] (posterior pelvic tilt) Postural Control Postural Control: Within Functional Limits  Balance Balance Balance Assessed: Yes Static Sitting Balance Static Sitting - Level of Assistance: 7: Independent Dynamic Sitting Balance Dynamic Sitting - Level of Assistance:  7: Independent Static Standing Balance Static Standing - Level of Assistance: 5: Stand by assistance Dynamic Standing Balance Dynamic Standing - Level of Assistance: 5: Stand by assistance Extremity Assessment  RLE Assessment RLE Assessment: Exceptions to [facility] General Strength Comments: Grossly 5/5, but impaired endurance LLE Assessment LLE Assessment: Exceptions to [facility] General Strength Comments: Grossly 5/5, but impaired endurance    Breck Coons, PT, DPT 12/15/2019, 3:39 PM

## 2019-12-15 NOTE — Progress Notes (Signed)
[name] PHYSICAL MEDICINE & REHABILITATION PROGRESS NOTE   Subjective/Complaints: Notes increased swelling in his feet this morning. Has no complaints. Working with Colletta Maryland to go to the bathroom. K+ has normalized  ROS:   Pt denies SOB, abd pain, CP, N/V/C/D, and vision changes   Objective:   No results found. Recent Labs    12/15/19 0723  WBC 5.4  HGB 10.0*  HCT 31.4*  PLT 182   Recent Labs    12/14/19 0304 12/15/19 0723  NA 138 138  K 3.4* 4.0  CL 96* 98  CO2 32 29  GLUCOSE 102* 130*  BUN 37* 38*  CREATININE 1.49* 1.45*  CALCIUM 8.6* 8.7*    Intake/Output Summary (Last 24 hours) at 12/15/2019 0913 Last data filed at 12/14/2019 1156 Gross per 24 hour  Intake --  Output 50 ml  Net -50 ml     Physical Exam: Vital Signs Blood pressure (!) 118/57, pulse 74, temperature 97.8 F (36.6 C), temperature source Oral, resp. rate 16, height 5\' 11"  (1.803 m), weight 91.8 kg, SpO2 93 %.  General: Alert and oriented x 3, No apparent distress HEENT: Head is normocephalic, atraumatic, PERRLA, EOMI, sclera anicteric, oral mucosa pink and moist, dentition intact, ext ear canals clear,  Neck: Supple without JVD or lymphadenopathy Heart: Reg rate and rhythm. No murmurs rubs or gallops Chest: CTA bilaterally without wheezes, rales, or rhonchi; no distress Abdomen: Soft, non-tender, non-distended, bowel sounds positive. Extremities: No clubbing, cyanosis, or edema. Pulses are 2+ Skin: Warm and dry.  Intact. Lower extremities wrapped in uniboots- L>R feet more swollen than legs- 2-3+ feet edema; 1-2+ leg edema, b/l- no change- need to be replaced/uniboots Psych:vague/appropriate- no change Musc: Lower extremity edema, worst in bilateral feet Neuro: Alert HOH Motor: Grossly 4+/5 throughout, unchanged   Assessment/Plan: 1. Functional deficits secondary to cardiac debility which require 3+ hours per day of interdisciplinary therapy in a comprehensive inpatient rehab  setting.  Physiatrist is providing close team supervision and 24 hour management of active medical problems listed below.  Physiatrist and rehab team continue to assess barriers to discharge/monitor patient progress toward functional and medical goals  Care Tool:  Bathing    Body parts bathed by patient: Right arm, Left arm, Chest, Abdomen, Face   Body parts bathed by helper: Buttocks     Bathing assist Assist Level: Minimal Assistance - Patient > 75%     Upper Body Dressing/Undressing Upper body dressing   What is the patient wearing?: Pull over shirt    Upper body assist Assist Level: Supervision/Verbal cueing    Lower Body Dressing/Undressing Lower body dressing      What is the patient wearing?: Pants, Underwear/pull up     Lower body assist Assist for lower body dressing: Contact Guard/Touching assist     Toileting Toileting    Toileting assist Assist for toileting: Minimal Assistance - Patient > 75%     Transfers Chair/bed transfer  Transfers assist     Chair/bed transfer assist level: Supervision/Verbal cueing     Locomotion Ambulation   Ambulation assist      Assist level: Supervision/Verbal cueing Assistive device: Walker-rolling Max distance: 200'   Walk 10 feet activity   Assist     Assist level: Supervision/Verbal cueing Assistive device: Walker-rolling   Walk 50 feet activity   Assist    Assist level: Supervision/Verbal cueing Assistive device: Walker-rolling    Walk 150 feet activity   Assist    Assist level: Supervision/Verbal cueing Assistive device:  Walker-rolling    Walk 10 feet on uneven surface  activity   Assist     Assist level: Supervision/Verbal cueing Assistive device: Aeronautical engineer Will patient use wheelchair at discharge?: No             Wheelchair 50 feet with 2 turns activity    Assist            Wheelchair 150 feet activity     Assist           Blood pressure (!) 118/57, pulse 74, temperature 97.8 F (36.6 C), temperature source Oral, resp. rate 16, height 5\' 11"  (1.803 m), weight 91.8 kg, SpO2 93 %.  Medical Problem List and Plan: 1.  Debility secondary to severe multivessel CAD plus critical AS.  Status post CABG x4 with bioprosthetic AVR 11/22/2019.  Sternal precautions.  Continue CIR 2.  Antithrombotics: -DVT/anticoagulation: Lovenox             -antiplatelet therapy: Aspirin 325 mg daily 3. Pain Management: Oxycodone as needed- not using, discontinued 4. Mood: Provide emotional support             -antipsychotic agents: N/A 5. Neuropsych: This patient is capable of making decisions on his own behalf. 6. Skin/Wound Care: Routine skin checks.  Patient does have bilateral Unna boots in place. 7. Fluids/Electrolytes/Nutrition: Routine in and outs.  8.  Acute blood loss anemia with macrocytosis.  Follow-up CBCs weekly.   Hemoglobin 9.0 on 7/7  Vitamin B12 reviewed borderline low-supplement initiated on 7/10 9.  Post cardiotomy shock.  Continue ProAmatine as directed as well as Demadex.  Monitor for any signs of fluid overload 10.  Diabetes mellitus with hyperglycemia.  Hemoglobin A1c 7.4.  SSI.  Patient on Glucotrol 5 mg daily prior to admission and resume as needed in place of insulin.              Decreased Levemir to 15U.   Changed to Novolog sensitive scale.   Labile on 7/11, will consider transition to oral hypoglycemic agent  7/12- decrease Levemir to 5 units and add back Glucotrol 5 mg XL from home  7/13- BGs 108-160- doing well- hopefully, can get off Insulin in next few days  7/14: CBGs still elevated: maintain current dose 11.  Hyperlipidemia.  Crestor 12.  Hypothyroidism.  Synthroid 13.  Constipation.  Continue Colace and Dulcolax tablet as directed. 14. Hypocalcemia: Supplement started.  15. Hypokalemia:   Continue 53meq BID   Potassium 4.2 on 7/10  7/12- K+ 3.9  7/13- K+ 3.4- will replete with 40  mEq of KCl  7/14: K+ normal. 16. AKI on CKD:   Creatinine 1.44 on 7/10  7/12- Cr 1.57- creeping up- likely torsemide dose- will check with Cards about dosing.   7/13- Cr down slightly to 1.49- will push PO  7/14: Cr improved  Continue to monitor  17. Urinary frequency  7/12- will check bladder scans ot make sure emptying.   7/13- will verify with nursing to do- not documented  18. Will remove PICC- place order and change uniboots   LOS: 8 days A FACE TO FACE EVALUATION WAS PERFORMED  William Walker 12/15/2019, 9:13 AM

## 2019-12-15 NOTE — Progress Notes (Signed)
Occupational Therapy Session Note  Patient Details  Name: William Walker MRN: [id number] Date of Birth: March 30, 1927  Today's Date: 12/15/2019 OT Individual Time: 0900-1013 OT Individual Time Calculation (min): 73 min    Short Term Goals: Week 1:  OT Short Term Goal 1 (Week 1): STGs=LTGs due to ELOS  Skilled Therapeutic Interventions/Progress Updates:    1:1. Pt received in bed agreeable to OT requesting shower. Pt completes all BADLs and mobility with RW and min VC for RW management/safety awareness during step over tub transfer, shower, dressing and toileting. Pt able to complete all ADLs at supervision level with only VC for safety. Pt requires frequent rest breaks d/t fatigue and VC for pacing. Pt tearful about needing increased supervision at home and wanting privacy. Support and encouragement provided and education that some of this supervision may be temporary as balance, strength and endurance improve. Education on shower chair use for energy conservation at home provided and pt verbalized understanding. Exited session with pt seated in bed, exit alarm on and call light in reach Therapy Documentation Precautions:  Precautions Precautions: Fall, Sternal Precaution Comments: educated in sternal precautions Restrictions Weight Bearing Restrictions: No General:   Vital Signs:  Pain:   ADL: ADL Grooming: Supervision/safety Where Assessed-Grooming: Sitting at sink Upper Body Bathing: Contact guard Where Assessed-Upper Body Bathing: Other (Comment) (sitting on toilet) Where Assessed-Lower Body Bathing: Other (Comment) Upper Body Dressing: Contact guard Where Assessed-Upper Body Dressing: Other (Comment) (sitting in toilet) Lower Body Dressing: Moderate assistance Where Assessed-Lower Body Dressing: Other (Comment) (sitting on toilet, sit to stand to don over hips) Toileting: Moderate assistance Where Assessed-Toileting: Glass blower/designer: Moderate assistance (mod sit to  stand, Min for stand pivot) Toilet Transfer Method: Stand pivot Vision   Perception    Praxis   Exercises:   Other Treatments:     Therapy/Group: Individual Therapy  Tonny Branch 12/15/2019, 10:11 AM

## 2019-12-15 NOTE — Progress Notes (Signed)
Patient ID: William Walker, male   DOB: 08-21-1926, 86 y.o.   MRN: [id number]   SW sent outpatient PT/OT referral to [facility] ([phone]/f:[phone]).   SW ordered RW with [employer] via parachute.   Loralee Pacas, MSW, [place] Office: [phone] Cell: [phone] Fax: [phone]

## 2019-12-15 NOTE — Progress Notes (Addendum)
Patient ID: William Walker, male   DOB: 09-04-1926, 86 y.o.   MRN: [id number]     Advanced Heart Failure Rounding Note  PCP-Cardiologist: [name], MD  AHF: Dr. Haroldine Laws   Patient Profile:    86 y/o male admitted last month w/ NSTEMI. Found to have severe multivessel CAD and critical AS. Underwent CABG x 4 + bioprosthetic AVR. Intraoperative echo w/ normal LVEF 50-55% but developed post cardiotomy shock requiring milrinone. Was able to wean off milrinone but required midodrine for pressor support. F/u echo showed EF up to 65% w/ G2DD. Once recovered from a surgical standpoint, he was discharged to [facility] on 7/6 for continued therapy.   AHF team asked to reassess given development of mild fluid retention. Noted to have increased swelling in his feet this morning.   Subjective:    Sitting up in bed eating lunch. Feels he has progressed well w/ rehab. Hopes to go home tomorrow.   No resting dyspnea. Denies CP.   Notes good UOP w/ PO torsemide. Complains of incontinence. SCr has been stable at 1.5 (peaked at 2.9 post operatively).   His wt when d/c to CIR was 209 lb. Wt today is 202 lb.   Objective:   Weight Range: 91.8 kg Body mass index is 28.23 kg/m.   Vital Signs:   Temp:  [97.8 F (36.6 C)-99 F (37.2 C)] 97.8 F (36.6 C) (07/14 0504) Pulse Rate:  [74] 74 (07/14 0504) Resp:  [16] 16 (07/14 0504) BP: (118-130)/(57-60) 118/57 (07/14 0504) SpO2:  [93 %-95 %] 93 % (07/14 0504) Weight:  [91.8 kg] 91.8 kg (07/13 1808) Last BM Date: 12/13/19      Weight change: Filed Weights   12/13/19 0500 12/14/19 0500 12/14/19 1808  Weight: 93.5 kg 91.3 kg 91.8 kg    Intake/Output:  No intake or output data in the 24 hours ending 12/15/19 1315    Physical Exam   General:  Elderly WM sitting up in bed. No resp difficulty HEENT: dentition in poor repair, otherwise normal Neck: supple. JVP 8 cm Carotids 2+ bilat; no bruits. No lymphadenopathy or thryomegaly appreciated. Cor:  Sternal incision ok PMI nondisplaced. Regular rate & rhythm. No rubs, gallops or murmurs. Lungs: clear. No wheezing  Abdomen: soft, nontender, nondistended. No hepatosplenomegaly. No bruits or masses. Good bowel sounds. Extremities: no cyanosis, clubbing, rash, 1+ bilateral pedal edema, bilateral UNNA boots Neuro: alert & orientedx3, cranial nerves grossly intact. moves all 4 extremities w/o difficulty. Affect pleasant   Telemetry   N/A  Labs    CBC Recent Labs    12/15/19 0723  WBC 5.4  NEUTROABS 3.0  HGB 10.0*  HCT 31.4*  MCV 102.6*  PLT 175   Basic Metabolic Panel Recent Labs    12/14/19 0304 12/15/19 0723  NA 138 138  K 3.4* 4.0  CL 96* 98  CO2 32 29  GLUCOSE 102* 130*  BUN 37* 38*  CREATININE 1.49* 1.45*  CALCIUM 8.6* 8.7*   Liver Function Tests No results for input(s): AST, ALT, ALKPHOS, BILITOT, PROT, ALBUMIN in the last 72 hours. No results for input(s): LIPASE, AMYLASE in the last 72 hours. Cardiac Enzymes No results for input(s): CKTOTAL, CKMB, CKMBINDEX, TROPONINI in the last 72 hours.  BNP: BNP (last 3 results) Recent Labs    11/15/19 1947  BNP 606.3*    ProBNP (last 3 results) No results for input(s): PROBNP in the last 8760 hours.   D-Dimer No results for input(s): DDIMER in the last 72  hours. Hemoglobin A1C No results for input(s): HGBA1C in the last 72 hours. Fasting Lipid Panel No results for input(s): CHOL, HDL, LDLCALC, TRIG, CHOLHDL, LDLDIRECT in the last 72 hours. Thyroid Function Tests No results for input(s): TSH, T4TOTAL, T3FREE, THYROIDAB in the last 72 hours.  Invalid input(s): FREET3  Other results:   Imaging    No results found.   Medications:     Scheduled Medications: . aspirin EC  325 mg Oral Daily   Or  . aspirin  324 mg Per Tube Daily  . bisacodyl  10 mg Oral Daily   Or  . bisacodyl  10 mg Rectal Daily  . calcium citrate  200 mg of elemental calcium Oral Daily  . Chlorhexidine Gluconate Cloth  6  each Topical Daily  . docusate sodium  200 mg Oral Daily  . enoxaparin (LOVENOX) injection  30 mg Subcutaneous Q24H  . feeding supplement  1 Container Oral q12n4p  . glipiZIDE  5 mg Oral Q breakfast  . insulin aspart  0-5 Units Subcutaneous QHS  . insulin aspart  0-9 Units Subcutaneous TID WC  . insulin detemir  5 Units Subcutaneous Daily  . levothyroxine  50 mcg Oral QAC breakfast  . midodrine  10 mg Oral TID WC  . multivitamin with minerals  1 tablet Oral Daily  . nutrition supplement (JUVEN)  1 packet Oral BID BM  . pantoprazole  40 mg Oral Daily  . potassium chloride  40 mEq Oral BID  . Ensure Max Protein  11 oz Oral Daily  . rosuvastatin  10 mg Oral Daily  . sodium chloride flush  10-40 mL Intracatheter Q12H  . torsemide  20 mg Oral Daily  . torsemide  40 mg Oral Daily  . vitamin B-12  250 mcg Oral Daily    Infusions:   PRN Medications: sodium chloride flush, sorbitol, traMADol     Assessment/Plan   1. Severe Multivessel CAD + critical AS - 11/22/2019 S/P CABG x 4 + bioprosthetic AVR - doing well. No s/s ischemia - continue ASA and statin - b-blocker on hold due to recent shock/low co-ox and low BP requiring midodrine   2. Post Cardiotomy Shock  - ECHO EF 50-55% intraoperative.  - Echo 11/30/19 EF 60% RV ok - Milrinone off. Continues on midodrine w/ stable BP - mild fluid retention w/ bilateral pedal edema but overall fluid status ok.  - Scr stable  - Continue torsemide 40/20 on d/c - On midodrine 10 mg tid for BP support.  - recommend compression stockings at home    3. Anemia , expected blood loss.  - Hgb stable and trending up, 10.0 on today's labs   3. AKI due to post-op ATN - Scr peaked at 2.9 post operatively  - Renal function improved. Scr has plateued at 1.5.   4. DMII - On SSI  5. Debility  - improving w/ CIR   Likely d/c from CIR soon.   Cardiac Meds for discharge ASA 325 mg daily  Midodrine 10 mg tid Torsemide 40 mg qam/ 20 mg  qpm KCl 40 mEq bid Crestor 10 mg daily    Brittainy Simmons, PA-C  1:15 PM  Patient seen with PA, agree with the above note.  He is stable for home tomorrow on the above medication regimen.  We will followup in clinic.   Loralie Champagne 12/15/2019 6:43 PM

## 2019-12-15 NOTE — Plan of Care (Signed)
  Problem: Consults Goal: RH GENERAL PATIENT EDUCATION Description: See Patient Education module for education specifics. Outcome: Progressing   Problem: RH SKIN INTEGRITY Goal: RH STG MAINTAIN SKIN INTEGRITY WITH ASSISTANCE Description: STG Maintain Skin Integrity With mod I assist Outcome: Progressing   Problem: RH SAFETY Goal: RH STG ADHERE TO SAFETY PRECAUTIONS W/ASSISTANCE/DEVICE Description: STG Adhere to Safety Precautions With cues and reminders Outcome: Progressing   Problem: RH PAIN MANAGEMENT Goal: RH STG PAIN MANAGED AT OR BELOW PT'S PAIN GOAL Description: Pain level less than 4 on scale of 0-10 Outcome: Progressing   Problem: RH KNOWLEDGE DEFICIT GENERAL Goal: RH STG INCREASE KNOWLEDGE OF SELF CARE AFTER HOSPITALIZATION Description: Pt will be able to adhere to medication regimen and dietary restriction to prevent complications related to CABG with mod I assist. Pt will demonstrate understanding of activity restrictions after CABG with mod I assist.  Outcome: Progressing

## 2019-12-15 NOTE — Progress Notes (Signed)
Occupational Therapy Discharge Summary  Patient Details  Name: William Walker MRN: [id number] Date of Birth: 30-Oct-1926  Today's Date: 12/15/2019 OT Individual Time: 1130-1158 OT Individual Time Calculation (min): 28 min    Patient has met 8 of 10 long term goals due to improved activity tolerance, improved balance and improved awareness.  Patient to discharge at overall Supervision level.  Patient's care partner is independent to provide the necessary physical assistance at discharge. The pt has shown improvement in standing balance, functional mobility, ADL transfers, and safety with all ADLs within sternal precautions. Pt to DC home with wife and children to assist and is overall supervision level with RW.   Reasons goals not met: Pt is overall supervision for both seated and standing ADLs. Requires occasional cues for safety and proper technique.  Recommendation:  Patient will benefit from ongoing skilled OT services in outpatient setting to continue to advance functional skills in the area of BADL, iADL and Reduce care partner burden.  Equipment: none, family is going to purchase shower seat  Reasons for discharge: treatment goals met and discharge from hospital  Patient/family agrees with progress made and goals achieved: Yes   Skilled Interventions: Pt greeted at time of session reclined in bed with [facility] elevated agreeable to OT session, stating he had already showered, dressed, and used toilet earlier today. Pt stated he had just used brief and needed to change brief. Supine to sitting up at EOB with supervision, sit to stand supervision and doffed LB clothing in the same manner, able to static stand without UE support. Ambulated to sink level to perform bathing at front periarea d/t urine, which he performed all in standing with supervision. Ambulated back to bed with RW with supervision and performed LB dressing for new brief and shorts supervision for both. Reviewed sternal  precautions, pacing, and energy conservation techniques at home. Pt is aware that he will need family supervision during showers for safety. Pt reclined in bed with HOB elevated, alarm on, call bell in reach, all needs met.    OT Discharge Precautions/Restrictions  Restrictions Weight Bearing Restrictions: No Pain Pain Assessment Pain Scale: 0-10 Pain Score: 0-No pain ADL ADL Grooming: Supervision/safety Where Assessed-Grooming: Sitting at sink Upper Body Bathing: Contact guard Where Assessed-Upper Body Bathing: Other (Comment) (sitting on toilet) Where Assessed-Lower Body Bathing: Other (Comment) Upper Body Dressing: Contact guard Where Assessed-Upper Body Dressing: Other (Comment) (sitting in toilet) Lower Body Dressing: Moderate assistance Where Assessed-Lower Body Dressing: Other (Comment) (sitting on toilet, sit to stand to don over hips) Toileting: Moderate assistance Where Assessed-Toileting: Glass blower/designer: Moderate assistance (mod sit to stand, Min for stand pivot) Toilet Transfer Method: Stand pivot Vision Baseline Vision/History: Wears glasses Patient Visual Report: No change from baseline Perception  Perception: Within Functional Limits Praxis Praxis: Intact Cognition Overall Cognitive Status: Within Functional Limits for tasks assessed Arousal/Alertness: Awake/alert Orientation Level: Oriented X4 Safety/Judgment: Appears intact Sensation Sensation Light Touch: Impaired by gross assessment Additional Comments: Light touch intact but dimished in BLEs. Baseline neuropathy. Coordination Gross Motor Movements are Fluid and Coordinated: Yes Fine Motor Movements are Fluid and Coordinated: Yes Motor  Motor Motor: Within Functional Limits Mobility  Bed Mobility Bed Mobility: Supine to Sit;Sit to Supine Rolling Left: Independent Supine to Sit: Independent Sit to Supine: Independent Transfers Sit to Stand: Supervision/Verbal cueing Stand to Sit:  Supervision/Verbal cueing  Trunk/Postural Assessment  Cervical Assessment Cervical Assessment: Exceptions to [facility] Thoracic Assessment Thoracic Assessment: Exceptions to [facility] Lumbar Assessment Lumbar Assessment: Exceptions to  WFL Postural Control Postural Control: Within Functional Limits  Balance Balance Balance Assessed: Yes Static Sitting Balance Static Sitting - Level of Assistance: 7: Independent Dynamic Sitting Balance Dynamic Sitting - Level of Assistance: 5: Stand by assistance Static Standing Balance Static Standing - Level of Assistance: 5: Stand by assistance Dynamic Standing Balance Dynamic Standing - Level of Assistance: 5: Stand by assistance Extremity/Trunk Assessment RUE Assessment RUE Assessment: Within Functional Limits LUE Assessment LUE Assessment: Within Functional Limits   [name] 12/15/2019, 12:20 PM

## 2019-12-15 NOTE — Progress Notes (Signed)
Orthopedic Tech Progress Note Patient Details:  William Walker [birth year] [id number] Spoke with RN about patient's UNNA BOOTS. Will be there at 9 to apply a fresh pair. Patient ID: William Walker, male   DOB: 1926-11-19, 86 y.o.   MRN: [id number]   William Walker 12/15/2019, 7:31 AM

## 2019-12-16 LAB — GLUCOSE, CAPILLARY: Glucose-Capillary: 99 mg/dL (ref 70–99)

## 2019-12-16 NOTE — Progress Notes (Addendum)
Inpatient Rehabilitation Care Coordinator  Discharge Note  The overall goal for the admission was met for:   Discharge location: Yes. [facility] for safety.   Length of Stay: Yes. 8 days.   Discharge activity level: Yes. [facility] for safety.   Home/community participation: Yes. Limited.   Services provided included: MD, RD, PT, OT, RN, CM, TR, Pharmacy, Neuropsych and SW  Financial Services: Private Insurance: [facility] Medicare  Follow-up services arranged: Outpatient: [facility] for PT/OT and DME: [facility] for RW  Comments (or additional information): contact pt# [phone] or his wife Wray Kearns (same number).   Patient/Family verbalized understanding of follow-up arrangements: Yes  Individual responsible for coordination of the follow-up plan: Pt to have assistance with coordinating care needs.   Confirmed correct DME delivered: Rana Snare 12/16/2019    Rana Snare

## 2019-12-16 NOTE — Progress Notes (Signed)
Patient  Discharge per wheelchair accompanied by NT and daughter. Discharge instructions done by [facility]; no further questions noted.

## 2019-12-16 NOTE — Progress Notes (Addendum)
Patient for discharge today ; per MD patient going home with unna boots.

## 2019-12-16 NOTE — Progress Notes (Signed)
[facility] PHYSICAL MEDICINE & REHABILITATION PROGRESS NOTE   Subjective/Complaints: Patient was seen earlier this morning. He was donning underwear with MinA from daughter. They have no further questions. Medications and f/u appointments reviewed.   ROS:   Pt denies SOB, abd pain, CP, N/V/C/D, and vision changes   Objective:   No results found. Recent Labs    12/15/19 0723  WBC 5.4  HGB 10.0*  HCT 31.4*  PLT 182   Recent Labs    12/14/19 0304 12/15/19 0723  NA 138 138  K 3.4* 4.0  CL 96* 98  CO2 32 29  GLUCOSE 102* 130*  BUN 37* 38*  CREATININE 1.49* 1.45*  CALCIUM 8.6* 8.7*    Intake/Output Summary (Last 24 hours) at 12/16/2019 1238 Last data filed at 12/16/2019 0800 Gross per 24 hour  Intake 600 ml  Output --  Net 600 ml     Physical Exam: Vital Signs Blood pressure (!) 132/58, pulse 77, temperature 98.4 F (36.9 C), resp. rate 15, height 5\' 11"  (1.803 m), weight 91.8 kg, SpO2 94 %.   General: Alert and oriented x 3, No apparent distress HEENT: Head is normocephalic, atraumatic, PERRLA, EOMI, sclera anicteric, oral mucosa pink and moist, dentition intact, ext ear canals clear,  Neck: Supple without JVD or lymphadenopathy Heart: Reg rate and rhythm. No murmurs rubs or gallops Chest: CTA bilaterally without wheezes, rales, or rhonchi; no distress Abdomen: Soft, non-tender, non-distended, bowel sounds positive. Extremities: No clubbing, cyanosis, or edema. Pulses are 2+ Skin: Warm and dry.  Intact. Lower extremities wrapped in uniboots- L>R feet more swollen than legs- 2-3+ feet edema; 1-2+ leg edema, b/l- no change- need to be replaced/uniboots Psych:vague/appropriate- no change Musc: Lower extremity edema, worst in bilateral feet Neuro: Alert HOH Motor: Grossly 4+/5 throughout, unchanged  Assessment/Plan: 1. Functional deficits secondary to cardiac debility which require 3+ hours per day of interdisciplinary therapy in a comprehensive inpatient  rehab setting.  Physiatrist is providing close team supervision and 24 hour management of active medical problems listed below.  Physiatrist and rehab team continue to assess barriers to discharge/monitor patient progress toward functional and medical goals  Care Tool:  Bathing    Body parts bathed by patient: Right arm, Left arm, Chest, Abdomen, Face, Front perineal area, Buttocks, Right upper leg, Left upper leg, Right lower leg, Left lower leg   Body parts bathed by helper: Buttocks     Bathing assist Assist Level: Supervision/Verbal cueing     Upper Body Dressing/Undressing Upper body dressing   What is the patient wearing?: Pull over shirt    Upper body assist Assist Level: Supervision/Verbal cueing    Lower Body Dressing/Undressing Lower body dressing      What is the patient wearing?: Underwear/pull up, Pants     Lower body assist Assist for lower body dressing: Supervision/Verbal cueing     Toileting Toileting    Toileting assist Assist for toileting: Supervision/Verbal cueing     Transfers Chair/bed transfer  Transfers assist     Chair/bed transfer assist level: Supervision/Verbal cueing     Locomotion Ambulation   Ambulation assist      Assist level: Supervision/Verbal cueing Assistive device: Walker-rolling Max distance: 300'   Walk 10 feet activity   Assist     Assist level: Supervision/Verbal cueing Assistive device: Walker-rolling   Walk 50 feet activity   Assist    Assist level: Supervision/Verbal cueing Assistive device: Walker-rolling    Walk 150 feet activity   Assist  Assist level: Supervision/Verbal cueing Assistive device: Walker-rolling    Walk 10 feet on uneven surface  activity   Assist     Assist level: Supervision/Verbal cueing Assistive device: Aeronautical engineer Will patient use wheelchair at discharge?: No             Wheelchair 50 feet with 2 turns  activity    Assist            Wheelchair 150 feet activity     Assist          Blood pressure (!) 132/58, pulse 77, temperature 98.4 F (36.9 C), resp. rate 15, height 5\' 11"  (1.803 m), weight 91.8 kg, SpO2 94 %.  Medical Problem List and Plan: 1.  Debility secondary to severe multivessel CAD plus critical AS.  Status post CABG x4 with bioprosthetic AVR 11/22/2019.  Sternal precautions.  DC home today 2.  Antithrombotics: -DVT/anticoagulation: Lovenox             -antiplatelet therapy: Aspirin 325 mg daily 3. Pain Management: Pain is well controlled.  4. Mood: Provide emotional support. In positive mood             -antipsychotic agents: N/A 5. Neuropsych: This patient is capable of making decisions on his own behalf. 6. Skin/Wound Care: Routine skin checks.  Patient does have bilateral Unna boots in place. 7. Fluids/Electrolytes/Nutrition: Routine in and outs.  8.  Acute blood loss anemia with macrocytosis.  Follow-up CBCs weekly.   Hemoglobin 9.0 on 7/7  Vitamin B12 reviewed borderline low-supplement initiated on 7/10 9.  Post cardiotomy shock.  Continue ProAmatine as directed as well as Demadex.  Monitor for any signs of fluid overload 10.  Diabetes mellitus with hyperglycemia.  Hemoglobin A1c 7.4.  SSI.  Patient on Glucotrol 5 mg daily prior to admission and resume as needed in place of insulin.              CBGs better controlled.  11.  Hyperlipidemia.  Crestor 12.  Hypothyroidism.  Synthroid 13.  Constipation.  Continue Colace and Dulcolax tablet as directed. 14. Hypocalcemia: Supplement started.  15. Hypokalemia:   Continue 56meq BID   Potassium 4.2 on 7/10  7/12- K+ 3.9  7/13- K+ 3.4- will replete with 40 mEq of KCl  7/14: K+ normal. 16. AKI on CKD:   Creatinine 1.44 on 7/10  7/12- Cr 1.57- creeping up- likely torsemide dose- will check with Cards about dosing.   7/13- Cr down slightly to 1.49- will push PO  7/14: Cr improved  Continue to monitor  17.  Urinary frequency  7/12- will check bladder scans ot make sure emptying.   7/13- will verify with nursing to do- not documented 18. Will remove PICC- place order and change uniboots   >30 minutes spent in discharge of patient including review of medications and follow-up appointments, physical examination, review of CBGs and BP, and in answering all patient's questions  LOS: 9 days A FACE TO FACE EVALUATION WAS PERFORMED  William Walker William Walker 12/16/2019, 12:38 PM

## 2019-12-27 DIAGNOSIS — E876 Hypokalemia: Secondary | ICD-10-CM | POA: Diagnosis not present

## 2019-12-27 DIAGNOSIS — D649 Anemia, unspecified: Secondary | ICD-10-CM | POA: Diagnosis not present

## 2019-12-27 DIAGNOSIS — I214 Non-ST elevation (NSTEMI) myocardial infarction: Secondary | ICD-10-CM | POA: Diagnosis not present

## 2019-12-27 DIAGNOSIS — I2510 Atherosclerotic heart disease of native coronary artery without angina pectoris: Secondary | ICD-10-CM | POA: Diagnosis not present

## 2019-12-28 ENCOUNTER — Telehealth: Payer: Self-pay

## 2019-12-28 ENCOUNTER — Telehealth: Payer: Self-pay | Admitting: Cardiology

## 2019-12-28 MED ORDER — AMOXICILLIN 500 MG PO CAPS
ORAL_CAPSULE | ORAL | 3 refills | Status: DC
Start: 2019-12-28 — End: 2020-07-12

## 2019-12-28 NOTE — Telephone Encounter (Signed)
Pt requires pre op abx due to history of bioprosthetic valve replacement. Please advise pt that rx for amoxicillin has been sent to his local pharmacy.

## 2019-12-28 NOTE — Telephone Encounter (Signed)
The clearance note will have to be printed, scanned, and emailed from the office.  Kerin Ransom PA-C 12/28/2019 4:28 PM

## 2019-12-28 NOTE — Telephone Encounter (Signed)
New message    Patient just had valve replacement with Dr Servando Snare , he has already cleared the patient for surgery , Dr Servando Snare wants Dr Domenic Polite to prescribe the patients pre- procedure medication and his aftercare medication       William Walker Pre-operative Risk Assessment    Walker STAFF: - Please ensure there is not already an duplicate clearance open for this procedure. - Under Visit Info/Reason for Call, type in Other and utilize the format Clearance MM/DD/YY or Clearance TBD. Do not use dashes or single digits. - If request is for dental extraction, please clarify the # of teeth to be extracted.  Request for surgical clearance:  1. What type of surgery is being performed?  Extraction 2 front teeth  2. When is this surgery scheduled?  Not schedule   3. What type of clearance is required (medical clearance vs. Pharmacy clearance to hold med vs. Both)? no  4. Are there any medications that need to be held prior to surgery and how long? No   5. Practice name and name of physician performing surgery? Dr Jeralene Peters   6. What is the office phone number?    7.   What is the office fax number? None   8.   Anesthesia type (None, local, MAC, general) ? Local   William Walker 12/28/2019, 2:10 PM  _________________________________________________________________

## 2019-12-28 NOTE — Telephone Encounter (Signed)
Dental Network engineer at [facility] contacted office to obtain consent from Dr. Servando Snare to extract three of pt's front teeth. Pt is s/p CABG/AVR on 11/22/19. It was reported that pt broke three of his teeth "to the gum" and is in need of extractions. Dr. Servando Snare consulted and states it is okay with him to proceed w/ extracting teeth, but pt's cardiologist (Dr. Domenic Polite) should be contacted by dentist if he/she needs direction regarding prophylactic antibiotics. Dental office made aware.

## 2019-12-28 NOTE — Telephone Encounter (Signed)
   Primary Cardiologist: [name], MD  Chart reviewed as part of pre-operative protocol coverage.   Simple dental extractions are considered low risk procedures per guidelines and generally do not require any specific cardiac clearance. It is also generally accepted that for simple extractions and dental cleanings, there is no need to interrupt blood thinner therapy.  SBE prophylaxis is required for the patient and a prescription for Amoxicillin 2 grams PO 30-60 minutes pre op has been called in.    I will route this recommendation to the requesting party via Epic fax function and remove from pre-op pool.  Please call with questions.  Kerin Ransom, PA-C 12/28/2019, 3:05 PM

## 2019-12-28 NOTE — Telephone Encounter (Signed)
Pharmacy can you comment on SBE prophylaxis in this patient.  Kerin Ransom PA-C 12/28/2019 2:22 PM

## 2019-12-28 NOTE — Telephone Encounter (Signed)
I called Doctor, general practice at [phone] about this patient. Their fax machine is not working, they requested surgical clearance sent back by email. Email is [email or web address]

## 2019-12-29 ENCOUNTER — Telehealth: Payer: Self-pay | Admitting: Cardiology

## 2019-12-29 ENCOUNTER — Other Ambulatory Visit: Payer: Self-pay | Admitting: Cardiothoracic Surgery

## 2019-12-29 DIAGNOSIS — Z953 Presence of xenogenic heart valve: Secondary | ICD-10-CM

## 2019-12-29 MED ORDER — CEPHALEXIN 500 MG PO CAPS: ORAL_CAPSULE | ORAL | 0 refills | Status: DC

## 2019-12-29 NOTE — Telephone Encounter (Signed)
I spoke with dental office. Their fax is not working so I spoke with front desk and gave them dental prophy instructions. Cephalexin 2000 mg 30-60 minutes before dental procedure. Patient does not need antibiotics after procedure.   I spoke with son Jakaree Pickard. He is aware to pick up prescription at the local [facility]  I e-scribed Rx to [facility].

## 2019-12-29 NOTE — Telephone Encounter (Signed)
Left message for patient to call back.   [facility] states their record indicated No Known Allergies.   [facility] also states they have No allergies listed       I did speak with patient. He states when he was in his 20's, Dr.Cresenzo had told him he had rash from penicillin and to avoid it.

## 2019-12-29 NOTE — Telephone Encounter (Signed)
What is his allergic response to penicillin?  I do not see that in the chart.

## 2019-12-29 NOTE — Telephone Encounter (Signed)
°  Marcia Brash, RN Note Medical illustrator at [facility] contacted office to obtain consent from Dr. Servando Snare to extract three of pt's front teeth. Pt is s/p CABG/AVR on 11/22/19. It was reported that pt broke three of his teeth "to the gum" and is in need of extractions. Dr. Servando Snare consulted and states it is okay with him to proceed w/ extracting teeth, but pt's cardiologist (Dr. Domenic Polite) should be contacted by dentist if he/she needs direction regarding prophylactic antibiotics. Dental office made aware.         I will forward to Dr.MCDowell

## 2019-12-29 NOTE — Telephone Encounter (Signed)
In the absence of a severe/anaphylactic reaction to penicillin, recommendation is cephalexin 2000 mg, 30 to 60 minutes prior to dental procedure.  He does not need antibiotics post procedure.

## 2019-12-29 NOTE — Telephone Encounter (Signed)
New message    Patient is allergic to Penicillin per nurse need another medication for patient to take pre procedure and do you want him to take any medication after the procedure ?

## 2019-12-30 ENCOUNTER — Ambulatory Visit (INDEPENDENT_AMBULATORY_CARE_PROVIDER_SITE_OTHER): Payer: Self-pay | Admitting: Cardiothoracic Surgery

## 2019-12-30 ENCOUNTER — Ambulatory Visit
Admission: RE | Admit: 2019-12-30 | Discharge: 2019-12-30 | Disposition: A | Discharge status: Home / Self Care | Payer: Medicare Other | Source: Ambulatory Visit | Attending: Cardiothoracic Surgery | Admitting: Cardiothoracic Surgery

## 2019-12-30 ENCOUNTER — Other Ambulatory Visit: Payer: Self-pay

## 2019-12-30 VITALS — BP 118/65 | HR 85 | Temp 97.7°F | Resp 20 | Ht 71.0 in | Wt 200.0 lb

## 2019-12-30 DIAGNOSIS — I2510 Atherosclerotic heart disease of native coronary artery without angina pectoris: Secondary | ICD-10-CM

## 2019-12-30 DIAGNOSIS — R0602 Shortness of breath: Secondary | ICD-10-CM | POA: Diagnosis not present

## 2019-12-30 DIAGNOSIS — I350 Nonrheumatic aortic (valve) stenosis: Secondary | ICD-10-CM

## 2019-12-30 DIAGNOSIS — Z953 Presence of xenogenic heart valve: Secondary | ICD-10-CM

## 2019-12-30 NOTE — Progress Notes (Signed)
[address]       [place] [address]             [phone]      William Walker [facility] Medical Record #[id number] Date of Birth: 06-08-1926  Referring: Lorretta Harp, MD Primary Care: Cory Munch, [place] Primary Cardiologist: [name], MD   Chief Complaint:   POST OP FOLLOW UP OPERATIVE REPORT DATE OF PROCEDURE:  11/22/2019 PREOPERATIVE DIAGNOSIS:  Critical aortic stenosis and severe coronary artery disease with left main obstruction. POSTOPERATIVE DIAGNOSIS:  Critical aortic stenosis and severe coronary artery disease with left main obstruction. SURGICAL PROCEDURE: 1.  Aortic valve replacement with Inspiris Resilia tissue aortic valve 23 mm, serial #[id number]. 2.  Coronary artery bypass grafting x4 with the left internal mammary to the left anterior descending coronary artery, sequential reverse saphenous vein graft to the intermediate and first obtuse marginal, reverse saphenous vein graft to the posterior  descending with right thigh greater saphenous endoscopic vein harvest. SURGEON:  Lanelle Bal, MD  History of Present Illness:     Patient returns to the office today after recent surgery for critical aortic stenosis and and severe left main coronary artery disease.  Following surgery he developed acute on chronic renal failure which responded with conservative therapy not requiring dialysis.  He ultimately then made it to inpatient rehab and is now returned home doing well.  He notes that he does walk around his house uses a walker for balance.  He has had no recurrent angina.  His signs and symptoms of congestive heart failure improved compared to his preoperative state.  While in the hospital several teeth broke he is now on arranging with dental to extract these teeth he has a prescription for Keflex from cardiology to take periprocedural.     Past Medical History:  Diagnosis Date  . Aortic stenosis   . Coronary atherosclerosis of  native coronary artery    Reportedly 2 stents - presumably LAD (records not available)  . CVD (cerebrovascular disease)   . Essential hypertension   . GERD (gastroesophageal reflux disease)   . Hyperlipidemia   . MI (myocardial infarction) [facility])    October 1999  . Type 2 diabetes mellitus with diabetic neuropathy (HCC)      Social History   Tobacco Use  Smoking Status Former Smoker  . Types: Pipe, Cigars  . Quit date: 12/03/1948  . Years since quitting: 71.1  Smokeless Tobacco Current User  . Types: Chew  Tobacco Comment   has been chewing tobacco for 60-70 years    Social History   Substance and Sexual Activity  Alcohol Use No     No Known Allergies  Current Outpatient Medications  Medication Sig Dispense Refill  . amoxicillin (AMOXIL) 500 MG capsule Take 4 capsules by mouth 30-60 minutes prior to dental work. 4 capsule 3  . aspirin EC 325 MG EC tablet Take 1 tablet (325 mg total) by mouth daily. 30 tablet 0  . calcium citrate (CALCITRATE - DOSED IN MG ELEMENTAL CALCIUM) 950 (200 Ca) MG tablet Take 1 tablet (200 mg of elemental calcium total) by mouth daily. 30 tablet 0  . cephALEXin (KEFLEX) 500 MG capsule Take 4 capsules (2,000 mg) 30 to 60 minutes before dental procedure 4 capsule 0  . docusate sodium (COLACE) 100 MG capsule Take 2 capsules (200 mg total) by mouth daily. 10 capsule 0  . glipiZIDE (GLUCOTROL) 5 MG tablet Take 1 tablet (5  mg total) by mouth daily before breakfast. 30 tablet 0  . levothyroxine (SYNTHROID) 50 MCG tablet Take 1 tablet (50 mcg total) by mouth daily before breakfast. 30 tablet 0  . midodrine (PROAMATINE) 10 MG tablet Take 1 tablet (10 mg total) by mouth 3 (three) times daily with meals. 90 tablet 0  . Multiple Vitamin (MULTIVITAMIN WITH MINERALS) TABS tablet Take 1 tablet by mouth daily.    . pantoprazole (PROTONIX) 40 MG tablet Take 1 tablet (40 mg total) by mouth daily. 30 tablet 0  . potassium chloride SA (KLOR-CON) 20 MEQ tablet Take 2  tablets (40 mEq total) by mouth daily. 60 tablet 0  . rosuvastatin (CRESTOR) 10 MG tablet Take 1 tablet (10 mg total) by mouth daily. 30 tablet 0  . torsemide (DEMADEX) 20 MG tablet Two tab am and one tab pm 90 tablet 0  . vitamin B-12 (CYANOCOBALAMIN) 250 MCG tablet Take 1 tablet (250 mcg total) by mouth daily. 30 tablet 0   No current facility-administered medications for this visit.       Physical Exam: BP 118/65   Pulse 85   Temp 97.7 F (36.5 C) (Skin)   Resp 20   Ht 5\' 11"  (1.803 m)   Wt 200 lb (90.7 kg)   SpO2 95% Comment: RA  BMI 27.89 kg/m   General appearance: alert, cooperative and no distress Neurologic: intact Heart: regular rate and rhythm, S1, S2 normal, no murmur, click, rub or gallop Lungs: clear to auscultation bilaterally Abdomen: soft, non-tender; bowel sounds normal; no masses,  no organomegaly Extremities: edema Mild pedal edema bilaterally much improved from early after surgery Wound: Sternum is stable and well-healed   Diagnostic Studies & Laboratory data:     Recent Radiology Findings:   DG Chest 2 View  Result Date: 12/30/2019 CLINICAL DATA:  86 year old with history of aortic valve replacement. Shortness of breath. EXAM: CHEST - 2 VIEW COMPARISON:  Chest radiograph 12/04/2019 FINDINGS: Two views of the chest demonstrate changes related to CABG procedure and aortic valve replacement. Blunting at the costophrenic angles, left side greater than right. Aeration at the right lung base has improved compared to the previous examination. Negative for pneumothorax. Probable compressive atelectasis at the left lung base. IMPRESSION: 1. Small left pleural effusion with compressive atelectasis. 2. Improved aeration at the right lung base compared to the previous examination. Suspect trace right pleural fluid or right basilar atelectasis. 3. Negative for pulmonary edema. 4. Postsurgical changes. Electronically Signed   By: Markus Daft M.D.   On: 12/30/2019 10:28       Recent Lab Findings: Lab Results  Component Value Date   WBC 5.4 12/15/2019   HGB 10.0 (L) 12/15/2019   HCT 31.4 (L) 12/15/2019   PLT 182 12/15/2019   GLUCOSE 130 (H) 12/15/2019   CHOL 166 11/16/2019   TRIG 88 11/16/2019   HDL 47 11/16/2019   LDLCALC 101 (H) 11/16/2019   ALT 101 (H) 12/08/2019   AST 88 (H) 12/08/2019   NA 138 12/15/2019   K 4.0 12/15/2019   CL 98 12/15/2019   CREATININE 1.45 (H) 12/15/2019   BUN 38 (H) 12/15/2019   CO2 29 12/15/2019   TSH 1.349 11/24/2019   INR 1.7 (H) 11/22/2019   HGBA1C 7.4 (H) 11/15/2019      Assessment / Plan:   #1 stable with good inpatient cardiac rehab has returned to living at home with care by his family following aortic valve replacement and coronary artery bypass grafting for  critical aortic stenosis and critical left main disease The patient overall notes that he feels well and is very pleased that he proceeded with operative therapy.   #2 dental issues-to have dental extraction next week-patient has a questionable history to penicillin in the 1950s so cardiology has prescribed Keflex periprocedural.  Plan to see the patient back as needed he will continue to be followed closely by cardiology in [place]   Medication Changes: No orders of the defined types were placed in this encounter.     [name] MD      [address] [place] [address] Office [phone]     12/30/2019 11:31 AM

## 2020-01-06 ENCOUNTER — Other Ambulatory Visit: Payer: Self-pay

## 2020-01-06 ENCOUNTER — Encounter (HOSPITAL_COMMUNITY): Payer: Self-pay

## 2020-01-06 ENCOUNTER — Ambulatory Visit (HOSPITAL_COMMUNITY)
Admission: RE | Admit: 2020-01-06 | Discharge: 2020-01-06 | Disposition: A | Discharge status: Home / Self Care | Payer: Medicare Other | Source: Ambulatory Visit | Attending: Cardiology | Admitting: Cardiology

## 2020-01-06 VITALS — BP 132/66 | HR 85 | Wt 201.8 lb

## 2020-01-06 DIAGNOSIS — I252 Old myocardial infarction: Secondary | ICD-10-CM | POA: Diagnosis not present

## 2020-01-06 DIAGNOSIS — I110 Hypertensive heart disease with heart failure: Secondary | ICD-10-CM | POA: Diagnosis not present

## 2020-01-06 DIAGNOSIS — E1140 Type 2 diabetes mellitus with diabetic neuropathy, unspecified: Secondary | ICD-10-CM | POA: Diagnosis not present

## 2020-01-06 DIAGNOSIS — I2510 Atherosclerotic heart disease of native coronary artery without angina pectoris: Secondary | ICD-10-CM | POA: Diagnosis not present

## 2020-01-06 DIAGNOSIS — Z79890 Hormone replacement therapy (postmenopausal): Secondary | ICD-10-CM | POA: Insufficient documentation

## 2020-01-06 DIAGNOSIS — R350 Frequency of micturition: Secondary | ICD-10-CM | POA: Diagnosis not present

## 2020-01-06 DIAGNOSIS — Z951 Presence of aortocoronary bypass graft: Secondary | ICD-10-CM | POA: Diagnosis not present

## 2020-01-06 DIAGNOSIS — Z8249 Family history of ischemic heart disease and other diseases of the circulatory system: Secondary | ICD-10-CM | POA: Insufficient documentation

## 2020-01-06 DIAGNOSIS — Z79899 Other long term (current) drug therapy: Secondary | ICD-10-CM | POA: Insufficient documentation

## 2020-01-06 DIAGNOSIS — I5030 Unspecified diastolic (congestive) heart failure: Secondary | ICD-10-CM

## 2020-01-06 DIAGNOSIS — F17220 Nicotine dependence, chewing tobacco, uncomplicated: Secondary | ICD-10-CM | POA: Insufficient documentation

## 2020-01-06 DIAGNOSIS — Z952 Presence of prosthetic heart valve: Secondary | ICD-10-CM | POA: Diagnosis not present

## 2020-01-06 DIAGNOSIS — Z7982 Long term (current) use of aspirin: Secondary | ICD-10-CM | POA: Diagnosis not present

## 2020-01-06 DIAGNOSIS — Z7984 Long term (current) use of oral hypoglycemic drugs: Secondary | ICD-10-CM | POA: Diagnosis not present

## 2020-01-06 DIAGNOSIS — E785 Hyperlipidemia, unspecified: Secondary | ICD-10-CM | POA: Diagnosis not present

## 2020-01-06 DIAGNOSIS — K219 Gastro-esophageal reflux disease without esophagitis: Secondary | ICD-10-CM | POA: Insufficient documentation

## 2020-01-06 MED ORDER — MIDODRINE HCL 5 MG PO TABS: 5.0000 mg | ORAL_TABLET | Freq: Three times a day (TID) | ORAL | 3 refills | Status: DC

## 2020-01-06 MED ORDER — POTASSIUM CHLORIDE CRYS ER 20 MEQ PO TBCR: 20.0000 meq | EXTENDED_RELEASE_TABLET | Freq: Every day | ORAL | 3 refills | Status: DC

## 2020-01-06 MED ORDER — TORSEMIDE 20 MG PO TABS: 20.0000 mg | ORAL_TABLET | Freq: Every day | ORAL | 3 refills | Status: DC

## 2020-01-06 NOTE — Progress Notes (Signed)
Advanced Heart Failure Clinic Note   Referring Physician: PCP: Cory Munch, PA-C PCP-Cardiologist: [name], MD  AHF: Dr. Haroldine Laws   HPI:  86 y/o male admitted 6/21 w/ NSTEMI. Found to have severe multivessel CAD and critical AS. Underwent CABG x 4 + bioprosthetic AVR. Intraoperativeecho w/ normal LVEF 50-55% but developed post cardiotomy shock requiring milrinone. Was able to wean off milrinone but required midodrine for pressor support. F/u echo showed EF up to 65% w/ G2DD. Once recovered from a surgical standpoint, he was discharged to [facility] on 7/6 for continued therapy. Ultimately discharged home on 7/17.  He presents to clinic today for f/u. Here w/ his daughter. Doing well. Had f/u w/ Dr. Servando Snare last week and noted to be doing well from post surgical standpoint. No cardiac symptoms. Denies CP. No exertional dyspnea, orthopnea or PND. Wt has been stable since discharge. He complains of urinary frequency and issues w/ incontience. On torsemide BID. Also remains on midodrine. He checks his BP daily and keeps a log, SBPs have been in the 130s on average. No hypotension. Appetite is good. He is asking if he can scale back on torsemide due to issues w/ incontinence. He had f/u labs done at PCP office last week. Labs personally reviewed, SCr 1.39 (1.45 day of hospital d/c). K 4.5.      Review of Systems: [y] = yes, [ ]  = no   General: Weight gain [ ] ; Weight loss [ ] ; Anorexia [ ] ; Fatigue [ ] ; Fever [ ] ; Chills [ ] ; Weakness [ ]   Cardiac: Chest pain/pressure [ ] ; Resting SOB [ ] ; Exertional SOB [ ] ; Orthopnea [ ] ; Pedal Edema [ ] ; Palpitations [ ] ; Syncope [ ] ; Presyncope [ ] ; Paroxysmal nocturnal dyspnea[ ]   Pulmonary: Cough [ ] ; Wheezing[ ] ; Hemoptysis[ ] ; Sputum [ ] ; Snoring [ ]   GI: Vomiting[ ] ; Dysphagia[ ] ; Melena[ ] ; Hematochezia [ ] ; Heartburn[ ] ; Abdominal pain [ ] ; Constipation [ ] ; Diarrhea [ ] ; BRBPR [ ]   GU: Hematuria[ ] ; Dysuria [ ] ; Nocturia[ ]  Urinary  Frequency/incontenence [Y] Vascular: Pain in legs with walking [ ] ; Pain in feet with lying flat [ ] ; Non-healing sores [ ] ; Stroke [ ] ; TIA [ ] ; Slurred speech [ ] ;  Neuro: Headaches[ ] ; Vertigo[ ] ; Seizures[ ] ; Paresthesias[ ] ;Blurred vision [ ] ; Diplopia [ ] ; Vision changes [ ]   Ortho/Skin: Arthritis [ ] ; Joint pain [ ] ; Muscle pain [ ] ; Joint swelling [ ] ; Back Pain [ ] ; Rash [ ]   Psych: Depression[ ] ; Anxiety[ ]   Heme: Bleeding problems [ ] ; Clotting disorders [ ] ; Anemia [ ]   Endocrine: Diabetes [ ] ; Thyroid dysfunction[ ]    Past Medical History:  Diagnosis Date  . Aortic stenosis   . Coronary atherosclerosis of native coronary artery    Reportedly 2 stents - presumably LAD (records not available)  . CVD (cerebrovascular disease)   . Essential hypertension   . GERD (gastroesophageal reflux disease)   . Hyperlipidemia   . MI (myocardial infarction) [facility])    October 1999  . Type 2 diabetes mellitus with diabetic neuropathy (HCC)     Current Outpatient Medications  Medication Sig Dispense Refill  . amoxicillin (AMOXIL) 500 MG capsule Take 4 capsules by mouth 30-60 minutes prior to dental work. 4 capsule 3  . aspirin EC 325 MG EC tablet Take 1 tablet (325 mg total) by mouth daily. 30 tablet 0  . calcium citrate (CALCITRATE - DOSED IN MG ELEMENTAL CALCIUM) 950 (200 Ca) MG  tablet Take 1 tablet (200 mg of elemental calcium total) by mouth daily. 30 tablet 0  . cephALEXin (KEFLEX) 500 MG capsule Take 4 capsules (2,000 mg) 30 to 60 minutes before dental procedure 4 capsule 0  . clindamycin (CLEOCIN) 300 MG capsule Take 300 mg by mouth. Prior to dental work    . docusate sodium (COLACE) 100 MG capsule Take 2 capsules (200 mg total) by mouth daily. 10 capsule 0  . glipiZIDE (GLUCOTROL) 5 MG tablet Take 1 tablet (5 mg total) by mouth daily before breakfast. 30 tablet 0  . levothyroxine (SYNTHROID) 50 MCG tablet Take 1 tablet (50 mcg total) by mouth daily before breakfast. 30 tablet 0  .  midodrine (PROAMATINE) 10 MG tablet Take 1 tablet (10 mg total) by mouth 3 (three) times daily with meals. 90 tablet 0  . Multiple Vitamin (MULTIVITAMIN WITH MINERALS) TABS tablet Take 1 tablet by mouth daily.    . Multiple Vitamins-Minerals (PRESERVISION AREDS 2+MULTI VIT PO) Take by mouth.    . pantoprazole (PROTONIX) 40 MG tablet Take 1 tablet (40 mg total) by mouth daily. 30 tablet 0  . potassium chloride SA (KLOR-CON) 20 MEQ tablet Take 2 tablets (40 mEq total) by mouth daily. 60 tablet 0  . rosuvastatin (CRESTOR) 10 MG tablet Take 1 tablet (10 mg total) by mouth daily. 30 tablet 0  . torsemide (DEMADEX) 20 MG tablet Two tab am and one tab pm 90 tablet 0  . vitamin B-12 (CYANOCOBALAMIN) 250 MCG tablet Take 1 tablet (250 mcg total) by mouth daily. 30 tablet 0   No current facility-administered medications for this encounter.    No Known Allergies    Social History   Socioeconomic History  . Marital status: Married    Spouse name: Not on file  . Number of children: Not on file  . Years of education: Not on file  . Highest education level: Not on file  Occupational History  . Not on file  Tobacco Use  . Smoking status: Former Smoker    Types: Pipe, Cigars    Quit date: 12/03/1948    Years since quitting: 71.1  . Smokeless tobacco: Current User    Types: Chew  . Tobacco comment: has been chewing tobacco for 60-70 years  Vaping Use  . Vaping Use: Never used  Substance and Sexual Activity  . Alcohol use: No  . Drug use: No  . Sexual activity: Not on file  Other Topics Concern  . Not on file  Social History Narrative  . Not on file   Social Determinants of Health   Financial Resource Strain:   . Difficulty of Paying Living Expenses:   Food Insecurity:   . Worried About Charity fundraiser in the Last Year:   . Arboriculturist in the Last Year:   Transportation Needs:   . Film/video editor (Medical):   Marland Kitchen Lack of Transportation (Non-Medical):   Physical Activity:    . Days of Exercise per Week:   . Minutes of Exercise per Session:   Stress:   . Feeling of Stress :   Social Connections:   . Frequency of Communication with Friends and Family:   . Frequency of Social Gatherings with Friends and Family:   . Attends Religious Services:   . Active Member of Clubs or Organizations:   . Attends Archivist Meetings:   Marland Kitchen Marital Status:   Intimate Partner Violence:   . Fear of Current or Ex-Partner:   .  Emotionally Abused:   Marland Kitchen Physically Abused:   . Sexually Abused:       Family History  Problem Relation Age of Onset  . Coronary artery disease Father     Vitals:   01/06/20 1010  BP: 132/66  Pulse: 85  SpO2: 97%  Weight: 91.5 kg (201 lb 12.8 oz)     PHYSICAL EXAM: General:  Well appearing, elderly WM, in wheel chair. No respiratory difficulty HEENT: normal Neck: supple. no JVD. Carotids 2+ bilat; no bruits. No lymphadenopathy or thyromegaly appreciated. Cor: PMI nondisplaced. Regular rate & rhythm. No rubs, gallops or murmurs. Lungs: clear Abdomen: soft, nontender, nondistended. No hepatosplenomegaly. No bruits or masses. Good bowel sounds. Extremities: no cyanosis, clubbing, rash, trace ankle edema Neuro: alert & oriented x 3, cranial nerves grossly intact. moves all 4 extremities w/o difficulty. Affect pleasant.    ASSESSMENT & PLAN:  1.Severe Multivessel CAD -  NSTEMI 6/21 s/p CABG - stable w/o CP  - continue ASA + statin   2. Critical AS - s/p bioprosthetic AVR at time of CABG 6/21 - stable prosthesis on post op echo  - needs SBE prophylaxis w/ dental work    3. HFpEF - intraoperative ECHO: EF 50-55% - Developed postcardiotomy shock post CABG + AVR requiring milrinone  - Post op Echo 11/30/19 EF 60% RV ok - milrinone weaned off.  - doing well post d/c - NYHA Class II-III  - Volume status stable  - SBPs 130s - will try to wean off midodrine, reduce to 5 mg tid. He will monitor BP at home and will call if any  hypotension/ orthostatic symptoms   - reduce torsemide to 20 mg daily. Reduce KCl to 20 mEq daily  - instructed to monitor daily wts and call if wt increases on reduced diuretic dose - he has f/u w/ general cardiology next week in [place]. Can increase torsemide back up if needed.   4. DMII - management per PCP   He is being followed closely by general cardiology. Anticipate at least 1 more visit here in [facility]. Will arrange f/u visit w/ Dr. Haroldine Laws in 8 weeks. If stable, can continue routine f/u w/ gen cards.     Lyda Jester, PA-C 01/06/20

## 2020-01-06 NOTE — Patient Instructions (Signed)
DECREASE Torsemide to 20 mg, one tab daily DECREASE Potassium  to 20 MEQ, one tab daily DECREASE Midodrine to 5 mg, one tab three times daily  Your physician recommends that you schedule a follow-up appointment in: 8 weeks with Dr Haroldine Laws  Do the following things EVERYDAY: 1) Weigh yourself in the morning before breakfast. Write it down and keep it in a log. 2) Take your medicines as prescribed 3) Eat low salt foods--Limit salt (sodium) to 2000 mg per day.  4) Stay as active as you can everyday 5) Limit all fluids for the day to less than 2 liters   If you have any questions or concerns before your next appointment please send [place] a message through [place] or call our office at [phone].    TO LEAVE A MESSAGE FOR THE NURSE SELECT OPTION 2, PLEASE LEAVE A MESSAGE INCLUDING:  YOUR NAME  DATE OF BIRTH  CALL BACK NUMBER  REASON FOR CALL**this is important as we prioritize the call backs  YOU WILL RECEIVE A CALL BACK THE SAME DAY AS LONG AS YOU CALL BEFORE 4:00 PM

## 2020-01-11 ENCOUNTER — Other Ambulatory Visit (HOSPITAL_COMMUNITY): Payer: Self-pay

## 2020-01-11 MED ORDER — POTASSIUM CHLORIDE CRYS ER 20 MEQ PO TBCR: 20.0000 meq | EXTENDED_RELEASE_TABLET | Freq: Every day | ORAL | 3 refills | Status: DC

## 2020-01-18 ENCOUNTER — Other Ambulatory Visit: Payer: Self-pay

## 2020-01-18 ENCOUNTER — Ambulatory Visit: Payer: Medicare Other | Admitting: Student

## 2020-01-18 ENCOUNTER — Encounter: Payer: Self-pay | Admitting: Student

## 2020-01-18 VITALS — BP 146/62 | HR 90 | Ht 72.0 in | Wt 210.0 lb

## 2020-01-18 DIAGNOSIS — E785 Hyperlipidemia, unspecified: Secondary | ICD-10-CM

## 2020-01-18 DIAGNOSIS — I5030 Unspecified diastolic (congestive) heart failure: Secondary | ICD-10-CM | POA: Diagnosis not present

## 2020-01-18 DIAGNOSIS — I350 Nonrheumatic aortic (valve) stenosis: Secondary | ICD-10-CM | POA: Diagnosis not present

## 2020-01-18 DIAGNOSIS — I951 Orthostatic hypotension: Secondary | ICD-10-CM

## 2020-01-18 DIAGNOSIS — I2510 Atherosclerotic heart disease of native coronary artery without angina pectoris: Secondary | ICD-10-CM

## 2020-01-18 DIAGNOSIS — Z79899 Other long term (current) drug therapy: Secondary | ICD-10-CM | POA: Diagnosis not present

## 2020-01-18 DIAGNOSIS — R29898 Other symptoms and signs involving the musculoskeletal system: Secondary | ICD-10-CM | POA: Diagnosis not present

## 2020-01-18 MED ORDER — PRAVASTATIN SODIUM 40 MG PO TABS
40.0000 mg | ORAL_TABLET | Freq: Every evening | ORAL | 3 refills | Status: DC
Start: 2020-01-18 — End: 2020-11-02

## 2020-01-18 MED ORDER — TORSEMIDE 20 MG PO TABS
40.0000 mg | ORAL_TABLET | Freq: Every day | ORAL | 3 refills | Status: DC
Start: 2020-01-18 — End: 2021-01-03

## 2020-01-18 NOTE — Patient Instructions (Signed)
Medication Instructions:  Your physician has recommended you make the following change in your medication:  Stop Taking Crestor  Start Taking Pravastatin 40 mg Daily  Increase Torsemide to 40 mg Daily   *If you need a refill on your cardiac medications before your next appointment, please call your pharmacy*   Lab Work: Your physician recommends that you return for lab work in: 2 Weeks   If you have labs (blood work) drawn today and your tests are completely normal, you will receive your results only by: Marland Kitchen MyChart Message (if you have MyChart) OR . A paper copy in the mail If you have any lab test that is abnormal or we need to change your treatment, we will call you to review the results.   Testing/Procedures: NONE    Follow-Up: At [facility], you and your health needs are our priority.  As part of our continuing mission to provide you with exceptional heart care, we have created designated Provider Care Teams.  These Care Teams include your primary Cardiologist (physician) and Advanced Practice Providers (APPs -  Physician Assistants and Nurse Practitioners) who all work together to provide you with the care you need, when you need it.  We recommend signing up for the patient portal called "MyChart".  Sign up information is provided on this After Visit Summary.  MyChart is used to connect with patients for Virtual Visits (Telemedicine).  Patients are able to view lab/test results, encounter notes, upcoming appointments, etc.  Non-urgent messages can be sent to your provider as well.   To learn more about what you can do with MyChart, go to [email or web address].    Your next appointment:   3 month(s)  The format for your next appointment:   In Person  Provider:   [name], MD or Bernerd Pho, PA-C   Other Instructions Thank you for choosing [facility]!

## 2020-01-18 NOTE — Progress Notes (Signed)
Cardiology Office Note    Date:  01/18/2020   ID:  KRISTIN LAMAGNA, DOB 1926/12/25, MRN [id number]  PCP:  Cory Munch, PA-C  Cardiologist: [name], MD    Chief Complaint  Patient presents with  . Follow-up    History of Present Illness:    William Walker is a 86 y.o. male with past medical history of CAD (s/p prior stenting of LAD, recent cath in 11/2019 showing LM and 3-vessel CAD--> s/p CABG on 11/22/2019 with LIMA-LAD, SVG-dRCA and seq SVG-RI-OM1 and complicated by post-cardiotomy shock), severe AS (s/p bioprosthetic AVR on 11/22/2019), HTN, HLD and hypothyroidism who presents to the office today for hospital follow-up.  The patient was admitted to [facility] in 11/2019 for evaluation of chest pain concerning for unstable angina and underwent a repeat cardiac catheterization which showed progression of his multivessel CAD with LM stenosis and three-vessel CAD. Given this along with his progressive severe aortic stenosis, CT surgery consult was obtained and he eventually underwent CABG and AVR on 11/22/2019 as outlined above. He did develop post-cardiotomy shock and was followed by the Advanced Heart Failure team and required IV Milrinone. He also developed postoperative anemia which improved throughout admission.  He was eventually discharged to inpatient rehab on 12/07/2019. Advanced Heart Failure did evaluate the patient while at rehab and a repeat echocardiogram was obtained which showed his EF had improved to 60 to 65% (previously 40 to 45% pre-op) and his prosthetic aortic valve was functioning normally with no evidence of perivalvular leak. He did have mild aortic valve regurgitation. Was discharged home on 12/16/2019.  He did follow-up with the [facility] on 01/06/2020 and reported overall doing well since returning home. He denied any recent chest pain or dyspnea. He did report episodes of urinary frequency but was taking Torsemide as prescribed. He was still  taking Midodrine 10 mg 3 times daily for orthostatic hypotension and this was reduced to 5 mg 3 times daily and he was going to follow BP at home and report back with readings. Torsemide was also reduced from 40mg  in AM/20mg  in PM to 20 mg daily.  In talking with the patient and his daughter today, he reports a 10 pound weight gain since the time of the recent dose reduction of Torsemide.  He says his appetite has improved and feels like this could be playing a role in symptoms but his daughter has noticed worsening facial swelling and lower extremity edema. He reports discomfort and weakness along his lower extremities which has limited his physical activity. By review of notes, Pravastatin was switched to Crestor during his recent admission. He is currently not undergoing physical therapy at home but would be open to this if indicated.  He denies any specific dyspnea on exertion, chest pain, palpitations, orthopnea. He does have a cough at times which is typically worse at night.  Past Medical History:  Diagnosis Date  . Aortic stenosis   . Aortic stenosis    a. s/p AVR in 11/2019  . Coronary atherosclerosis of native coronary artery    a. s/p prior stenting of LAD b. cath in 11/2019 showing LM and 3-vessel CAD--> s/p CABG on 11/22/2019 with LIMA-LAD, SVG-dRCA and seq SVG-RI-OM1 and complicated by post-cardiotomy shock  . CVD (cerebrovascular disease)   . Essential hypertension   . GERD (gastroesophageal reflux disease)   . Hyperlipidemia   . MI (myocardial infarction) [facility])    October 1999  . Type 2 diabetes mellitus  with diabetic neuropathy [facility])     Past Surgical History:  Procedure Laterality Date  . AORTIC VALVE REPLACEMENT N/A 11/22/2019   Procedure: AORTIC VALVE REPLACEMENT (AVR) USING INSPIRIS 23MM;  Surgeon: [name], MD;  Location: [place];  Service: Open Heart Surgery;  Laterality: N/A;  . Arch cerebral ateriogram  12/11/2005   Rosetta Posner MD  . CATARACT EXTRACTION,  BILATERAL    . CORONARY ARTERY BYPASS GRAFT N/A 11/22/2019   Procedure: CORONARY ARTERY BYPASS GRAFTING (CABG), ON PUMP, TIMES FOUR , USING LEFT INTERNAL MAMMARY ARTERY AND ENDOSCOPICALLY HARVESTED RIGHT GREATER SAPHENOUS VEIN;  Surgeon: [name], MD;  Location: [place];  Service: Open Heart Surgery;  Laterality: N/A;  SEQUENTIAL RAMUS INTERMEDIATE TO OM1 SAPHENOUS VEIN TO DISTAL PDA LIMA TO LAD  . INGUINAL HERNIA REPAIR Left 12/09/2012   Procedure: HERNIA REPAIR INGUINAL with mesh;  Surgeon: [name], MD;  Location: [facility];  Service: General;  Laterality: Left;  . INSERTION OF MESH Left 12/09/2012   Procedure: INSERTION OF MESH;  Surgeon: [name], MD;  Location: [facility];  Service: General;  Laterality: Left;  . RIGHT/LEFT HEART CATH AND CORONARY ANGIOGRAPHY N/A 10/07/2017   Procedure: RIGHT/LEFT HEART CATH AND CORONARY ANGIOGRAPHY;  Surgeon: [name], MD;  Location: [place] [facility];  Service: Cardiovascular;  Laterality: N/A;  . RIGHT/LEFT HEART CATH AND CORONARY ANGIOGRAPHY N/A 11/16/2019   Procedure: RIGHT/LEFT HEART CATH AND CORONARY ANGIOGRAPHY;  Surgeon: Lorretta Harp, MD;  Location: [place] [facility];  Service: Cardiovascular;  Laterality: N/A;  . TEE WITHOUT CARDIOVERSION N/A 11/22/2019   Procedure: TRANSESOPHAGEAL ECHOCARDIOGRAM (TEE);  Surgeon: [name], MD;  Location: [place];  Service: Open Heart Surgery;  Laterality: N/A;    Current Medications: Outpatient Medications Prior to Visit  Medication Sig Dispense Refill  . amoxicillin (AMOXIL) 500 MG capsule Take 4 capsules by mouth 30-60 minutes prior to dental work. 4 capsule 3  . aspirin EC 325 MG EC tablet Take 1 tablet (325 mg total) by mouth daily. 30 tablet 0  . calcium citrate (CALCITRATE - DOSED IN MG ELEMENTAL CALCIUM) 950 (200 Ca) MG tablet Take 1 tablet (200 mg of elemental calcium total) by mouth daily. 30 tablet 0  . cephALEXin (KEFLEX) 500 MG capsule Take 4 capsules (2,000 mg) 30 to 60  minutes before dental procedure 4 capsule 0  . clindamycin (CLEOCIN) 300 MG capsule Take 300 mg by mouth. Prior to dental work    . docusate sodium (COLACE) 100 MG capsule Take 2 capsules (200 mg total) by mouth daily. 10 capsule 0  . glipiZIDE (GLUCOTROL) 5 MG tablet Take 1 tablet (5 mg total) by mouth daily before breakfast. 30 tablet 0  . levothyroxine (SYNTHROID) 50 MCG tablet Take 1 tablet (50 mcg total) by mouth daily before breakfast. 30 tablet 0  . midodrine (PROAMATINE) 5 MG tablet Take 1 tablet (5 mg total) by mouth 3 (three) times daily with meals. 270 tablet 3  . Multiple Vitamin (MULTIVITAMIN WITH MINERALS) TABS tablet Take 1 tablet by mouth daily.    . Multiple Vitamins-Minerals (PRESERVISION AREDS 2+MULTI VIT PO) Take by mouth.    . pantoprazole (PROTONIX) 40 MG tablet Take 1 tablet (40 mg total) by mouth daily. 30 tablet 0  . potassium chloride SA (KLOR-CON) 20 MEQ tablet Take 1 tablet (20 mEq total) by mouth daily. 90 tablet 3  . vitamin B-12 (CYANOCOBALAMIN) 250 MCG tablet Take 1 tablet (250 mcg total) by  mouth daily. 30 tablet 0  . rosuvastatin (CRESTOR) 10 MG tablet Take 1 tablet (10 mg total) by mouth daily. 30 tablet 0  . torsemide (DEMADEX) 20 MG tablet Take 1 tablet (20 mg total) by mouth daily. 90 tablet 3   No facility-administered medications prior to visit.     Allergies:   Patient has no known allergies.   Social History   Socioeconomic History  . Marital status: Married    Spouse name: Not on file  . Number of children: Not on file  . Years of education: Not on file  . Highest education level: Not on file  Occupational History  . Not on file  Tobacco Use  . Smoking status: Former Smoker    Types: Pipe, Cigars    Quit date: 12/03/1948    Years since quitting: 71.1  . Smokeless tobacco: Current User    Types: Chew  . Tobacco comment: has been chewing tobacco for 60-70 years  Vaping Use  . Vaping Use: Never used  Substance and Sexual Activity  .  Alcohol use: No  . Drug use: No  . Sexual activity: Not on file  Other Topics Concern  . Not on file  Social History Narrative  . Not on file   Social Determinants of Health   Financial Resource Strain:   . Difficulty of Paying Living Expenses:   Food Insecurity:   . Worried About Charity fundraiser in the Last Year:   . Arboriculturist in the Last Year:   Transportation Needs:   . Film/video editor (Medical):   Marland Kitchen Lack of Transportation (Non-Medical):   Physical Activity:   . Days of Exercise per Week:   . Minutes of Exercise per Session:   Stress:   . Feeling of Stress :   Social Connections:   . Frequency of Communication with Friends and Family:   . Frequency of Social Gatherings with Friends and Family:   . Attends Religious Services:   . Active Member of Clubs or Organizations:   . Attends Archivist Meetings:   Marland Kitchen Marital Status:      Family History:  The patient's family history includes Coronary artery disease in his father.   Review of Systems:   Please see the history of present illness.     General:  No chills, fever, night sweats or weight changes. Positive for fatigue and myalgias.  Cardiovascular:  No chest pain, dyspnea on exertion, orthopnea, palpitations, paroxysmal nocturnal dyspnea. Positive for edema.  Dermatological: No rash, lesions/masses Respiratory: No cough, dyspnea Urologic: No hematuria, dysuria Abdominal:   No nausea, vomiting, diarrhea, bright red blood per rectum, melena, or hematemesis Neurologic:  No visual changes, wkns, changes in mental status. All other systems reviewed and are otherwise negative except as noted above.   Physical Exam:    VS:  BP (!) 146/62   Pulse 90   Ht 6' (1.829 m)   Wt 210 lb (95.3 kg)   SpO2 96%   BMI 28.48 kg/m    General: Elderly male appearing in no acute distress. Head: Normocephalic, atraumatic. Neck: No carotid bruits. JVD not elevated.  Lungs: Respirations regular and  unlabored, without wheezes or rales.  Heart: Regular rate and rhythm. No S3 or S4.  No rubs or gallops appreciated. No murmur appreciated. Sternal incision appears well healing without erythema or drainage.  Abdomen: Appears non-distended. No obvious abdominal masses. Msk:  Strength and tone appear normal for age. No obvious  joint deformities or effusions. Extremities: No clubbing or cyanosis. 1+ pitting edema bilaterally.  Distal pedal pulses are 2+ bilaterally. Neuro: Alert and oriented X 3. Moves all extremities spontaneously. No focal deficits noted. Psych:  Responds to questions appropriately with a normal affect. Skin: No rashes or lesions noted  Wt Readings from Last 3 Encounters:  01/18/20 210 lb (95.3 kg)  01/06/20 201 lb 12.8 oz (91.5 kg)  12/30/19 200 lb (90.7 kg)     Studies/Labs Reviewed:   EKG:  EKG is ordered today.  The ekg ordered today demonstrates NSR, HR 91 with 1st degree AV Block and PVC's. Incomplete LBBB.   Recent Labs: 11/15/2019: B Natriuretic Peptide 606.3 11/24/2019: TSH 1.349 12/08/2019: ALT 101 12/10/2019: Magnesium 2.2 12/15/2019: BUN 38; Creatinine, Ser 1.45; Hemoglobin 10.0; Platelets 182; Potassium 4.0; Sodium 138   Lipid Panel    Component Value Date/Time   CHOL 166 11/16/2019 0026   CHOL 179 05/05/2018 1001   TRIG 88 11/16/2019 0026   TRIG 134 05/05/2018 1001   HDL 47 11/16/2019 0026   HDL 54 05/05/2018 1001   CHOLHDL 3.5 11/16/2019 0026   VLDL 18 11/16/2019 0026   LDLCALC 101 (H) 11/16/2019 0026    Additional studies/ records that were reviewed today include:   Echocardiogram: 11/16/2019 IMPRESSIONS    1. Left ventricular ejection fraction, by estimation, is 40 to 45%. The  left ventricle has normal function. The left ventricle demonstrates  regional wall motion abnormalities (see scoring diagram/findings for  description). Left ventricular diastolic  parameters are consistent with Grade I diastolic dysfunction (impaired  relaxation).  There is moderate hypokinesis of the left ventricular, entire  apical segment.  2. Right ventricular systolic function is normal. The right ventricular  size is normal. There is normal pulmonary artery systolic pressure. The  estimated right ventricular systolic pressure is 24.2 mmHg.  3. The mitral valve is normal in structure. Mild mitral valve  regurgitation.  4. Tricuspid valve regurgitation is mild to moderate.  5. The aortic valve is tricuspid. Aortic valve regurgitation is mild.  Severe aortic valve stenosis.   Cardiac Catheterization: 11/2019  Mid LAD to Dist LAD lesion is 20% stenosed.  Ramus lesion is 95% stenosed.  Ost LAD lesion is 99% stenosed.  Ost LM to Mid LM lesion is 75% stenosed.  Prox Cx to Mid Cx lesion is 50% stenosed.  2nd Diag lesion is 90% stenosed.  Prox RCA lesion is 75% stenosed.  Hemodynamic findings consistent with aortic valve stenosis.   IMPRESSION: Mr. Blazejewski has had progression of his CAD.  His mid LAD stent is patent.  He has left main/three-vessel disease.  I believe he has at least 75% left main with a 99% ostial LAD 95% proximal to mid ramus branch stenosis as well as progression of disease in his dominant RCA.  He has severe aortic stenosis.  I do not think he is a percutaneous revascularization candidate.  Mynx closure devices were used to hemostatically sealed the right common femoral artery and venous puncture sites.  The patient left lab in stable condition.  Given his left main, severe CAD and the fact that these had chest pain with non-STEMI I am upgrading him to [facility] for closer clinical observation.  Limited Echo: 11/30/2019 IMPRESSIONS    1. Left ventricular ejection fraction, by estimation, is 60 to 65%. The  left ventricle has normal function. The left ventricle has no regional  wall motion abnormalities. Left ventricular diastolic parameters are  consistent with Grade II  diastolic  dysfunction (pseudonormalization). Elevated  left atrial pressure.  2. Right ventricular systolic function was not well visualized. The right  ventricular size is normal. There is mildly elevated pulmonary artery  systolic pressure. The estimated right ventricular systolic pressure is  21.3 mmHg.  3. The mitral valve is normal in structure. Trivial mitral valve  regurgitation.  4. There is no perivalvular leak. The aortic valve has been  repaired/replaced. Aortic valve regurgitation is mild. There is a  bioprosthetic valve present in the aortic position. Echo findings are  consistent with regurgitation of the aortic prosthesis.    Assessment:    1. Coronary artery disease involving native coronary artery of native heart without angina pectoris   2. Nonrheumatic aortic valve stenosis   3. Medication management   4. Weakness of both lower extremities   5. Heart failure with preserved ejection fraction, unspecified HF chronicity ([place])   6. Hyperlipidemia LDL goal <70   7. Orthostatic hypotension      Plan:   In order of problems listed above:  1. CAD - He recently underwent CABG on 11/22/2019 with LIMA-LAD, SVG-dRCA and seq SVG-RI-OM1 and this was complicated by post-cardiotomy shock as outlined above.  - He denies any exertional chest pain but has experienced intermittent dyspnea which has progressed since recent dose reduction of Torsemide. Will plan to titrate as outlined below.  - Continue ASA 325mg  daily. He is not on a BB given issues with orthostatic hypotension. Will switch Crestor to Pravastatin as outlined below.   2. Severe AS - He is s/p bioprosthetic AVR on 11/22/2019. Recent echocardiogram showed no evidence of perivalvular leak. Was noted to have mild AI.   3. Weakness of Lower Extremities - He reports muscle aches and weakness along his lower extremities which has progressed over the past few weeks. Does not feel like being as active as he initially was once returning home. I am concerned his myalgias are  secondary to statin therapy as he was previously intolerant to Atorvastatin and was switched from Pravastatin to Crestor 10mg  daily during admission. I recommended we switch back to Pravastatin for now as I am worried his activity level will continue to decrease and he will become deconditioned. If no improvement in symptoms with medication adjustments, he would benefit from [place] [facility].   4. Chronic Diastolic CHF - Repeat post-op echocardiogram showed his EF had improved to 60 to 65% as this was previously 40 to 45% in the pre-op setting.  - Weight has increased by 10 lbs since Torsemide was reduced from 40mg  in AM/20mg  in PM to 20mg  daily at his AHF clinic appointment earlier this month. Given his weight gain and edema. Will increase Torsemide to 40mg  in AM. Repeat BMET and CBC (given his weakness as above) in 2 weeks following medication adjustments.   5. HLD - He reports myalgias and I am concerned this is secondary to Crestor as he was previously intolerant to Atorvastatin. Will stop Crestor and switch back to Pravastatin 40mg  daily as he previously tolerated this well. If LDL remains above goal, could consider the use of Zetia or PCSK-9 inhibitor therapy.   6. Orthostatic Hypotension - He reports still having dizziness with positional changes and SBP has been variable from the 90's to 140's when checked at home. Will continue Midodrine at current dosing of 5mg  TID for now.    Medication Adjustments/Labs and Tests Ordered: Current medicines are reviewed at length with the patient today.  Concerns regarding medicines are  outlined above.  Medication changes, Labs and Tests ordered today are listed in the Patient Instructions below. Patient Instructions  Medication Instructions:  Your physician has recommended you make the following change in your medication:  Stop Taking Crestor  Start Taking Pravastatin 40 mg Daily  Increase Torsemide to 40 mg Daily   *If you need a refill on your  cardiac medications before your next appointment, please call your pharmacy*   Lab Work: Your physician recommends that you return for lab work in: 2 Weeks   If you have labs (blood work) drawn today and your tests are completely normal, you will receive your results only by: Marland Kitchen MyChart Message (if you have MyChart) OR . A paper copy in the mail If you have any lab test that is abnormal or we need to change your treatment, we will call you to review the results.   Testing/Procedures: NONE    Follow-Up: At [facility], you and your health needs are our priority.  As part of our continuing mission to provide you with exceptional heart care, we have created designated Provider Care Teams.  These Care Teams include your primary Cardiologist (physician) and Advanced Practice Providers (APPs -  Physician Assistants and Nurse Practitioners) who all work together to provide you with the care you need, when you need it.  We recommend signing up for the patient portal called "MyChart".  Sign up information is provided on this After Visit Summary.  MyChart is used to connect with patients for Virtual Visits (Telemedicine).  Patients are able to view lab/test results, encounter notes, upcoming appointments, etc.  Non-urgent messages can be sent to your provider as well.   To learn more about what you can do with MyChart, go to [email or web address].    Your next appointment:   3 month(s)  The format for your next appointment:   In Person  Provider:   [name], MD or Bernerd Pho, PA-C   Other Instructions Thank you for choosing [facility]!       Signed, Erma Heritage, PA-C  01/18/2020 7:21 PM    [facility] [address] [place], [place] [address] Phone: [phone] Fax: [phone]

## 2020-02-01 ENCOUNTER — Other Ambulatory Visit (HOSPITAL_COMMUNITY)
Admission: RE | Admit: 2020-02-01 | Discharge: 2020-02-01 | Disposition: A | Discharge status: Home / Self Care | Payer: Medicare Other | Source: Ambulatory Visit | Attending: Student | Admitting: Student

## 2020-02-01 DIAGNOSIS — Z79899 Other long term (current) drug therapy: Secondary | ICD-10-CM | POA: Diagnosis not present

## 2020-02-01 LAB — CBC
HCT: 36.1 % — ABNORMAL LOW (ref 39.0–52.0)
Hemoglobin: 11.5 g/dL — ABNORMAL LOW (ref 13.0–17.0)
MCH: 32.3 pg (ref 26.0–34.0)
MCHC: 31.9 g/dL (ref 30.0–36.0)
MCV: 101.4 fL — ABNORMAL HIGH (ref 80.0–100.0)
Platelets: 201 10*3/uL (ref 150–400)
RBC: 3.56 MIL/uL — ABNORMAL LOW (ref 4.22–5.81)
RDW: 15.6 % — ABNORMAL HIGH (ref 11.5–15.5)
WBC: 7.3 10*3/uL (ref 4.0–10.5)
nRBC: 0 % (ref 0.0–0.2)

## 2020-02-01 LAB — BASIC METABOLIC PANEL
Anion gap: 10 (ref 5–15)
BUN: 26 mg/dL — ABNORMAL HIGH (ref 8–23)
CO2: 30 mmol/L (ref 22–32)
Calcium: 9.3 mg/dL (ref 8.9–10.3)
Chloride: 97 mmol/L — ABNORMAL LOW (ref 98–111)
Creatinine, Ser: 1.48 mg/dL — ABNORMAL HIGH (ref 0.61–1.24)
GFR calc Af Amer: 47 mL/min — ABNORMAL LOW (ref >60)
GFR calc non Af Amer: 41 mL/min — ABNORMAL LOW (ref >60)
Glucose, Bld: 167 mg/dL — ABNORMAL HIGH (ref 70–99)
Potassium: 3.8 mmol/L (ref 3.5–5.1)
Sodium: 137 mmol/L (ref 135–145)

## 2020-02-18 ENCOUNTER — Encounter: Payer: Self-pay | Admitting: Student

## 2020-03-13 ENCOUNTER — Ambulatory Visit (HOSPITAL_COMMUNITY)
Admission: RE | Admit: 2020-03-13 | Discharge: 2020-03-13 | Disposition: A | Discharge status: Home / Self Care | Payer: Medicare Other | Source: Ambulatory Visit | Attending: Internal Medicine | Admitting: Internal Medicine

## 2020-03-13 ENCOUNTER — Other Ambulatory Visit: Payer: Self-pay

## 2020-03-13 VITALS — BP 140/80 | HR 93 | Wt 207.4 lb

## 2020-03-13 DIAGNOSIS — I2510 Atherosclerotic heart disease of native coronary artery without angina pectoris: Secondary | ICD-10-CM | POA: Insufficient documentation

## 2020-03-13 DIAGNOSIS — R002 Palpitations: Secondary | ICD-10-CM | POA: Diagnosis not present

## 2020-03-13 DIAGNOSIS — I252 Old myocardial infarction: Secondary | ICD-10-CM | POA: Diagnosis not present

## 2020-03-13 DIAGNOSIS — K219 Gastro-esophageal reflux disease without esophagitis: Secondary | ICD-10-CM | POA: Insufficient documentation

## 2020-03-13 DIAGNOSIS — I959 Hypotension, unspecified: Secondary | ICD-10-CM | POA: Insufficient documentation

## 2020-03-13 DIAGNOSIS — Z8249 Family history of ischemic heart disease and other diseases of the circulatory system: Secondary | ICD-10-CM | POA: Insufficient documentation

## 2020-03-13 DIAGNOSIS — Z953 Presence of xenogenic heart valve: Secondary | ICD-10-CM | POA: Insufficient documentation

## 2020-03-13 DIAGNOSIS — Z7982 Long term (current) use of aspirin: Secondary | ICD-10-CM | POA: Diagnosis not present

## 2020-03-13 DIAGNOSIS — I350 Nonrheumatic aortic (valve) stenosis: Secondary | ICD-10-CM | POA: Insufficient documentation

## 2020-03-13 DIAGNOSIS — I472 Ventricular tachycardia, unspecified: Secondary | ICD-10-CM

## 2020-03-13 DIAGNOSIS — Z7984 Long term (current) use of oral hypoglycemic drugs: Secondary | ICD-10-CM | POA: Insufficient documentation

## 2020-03-13 DIAGNOSIS — Z951 Presence of aortocoronary bypass graft: Secondary | ICD-10-CM | POA: Diagnosis not present

## 2020-03-13 DIAGNOSIS — I5032 Chronic diastolic (congestive) heart failure: Secondary | ICD-10-CM | POA: Diagnosis not present

## 2020-03-13 DIAGNOSIS — Z79899 Other long term (current) drug therapy: Secondary | ICD-10-CM | POA: Insufficient documentation

## 2020-03-13 DIAGNOSIS — F17220 Nicotine dependence, chewing tobacco, uncomplicated: Secondary | ICD-10-CM | POA: Insufficient documentation

## 2020-03-13 DIAGNOSIS — I6523 Occlusion and stenosis of bilateral carotid arteries: Secondary | ICD-10-CM | POA: Diagnosis not present

## 2020-03-13 DIAGNOSIS — I110 Hypertensive heart disease with heart failure: Secondary | ICD-10-CM | POA: Diagnosis not present

## 2020-03-13 DIAGNOSIS — K4090 Unilateral inguinal hernia, without obstruction or gangrene, not specified as recurrent: Secondary | ICD-10-CM | POA: Insufficient documentation

## 2020-03-13 DIAGNOSIS — M79606 Pain in leg, unspecified: Secondary | ICD-10-CM | POA: Insufficient documentation

## 2020-03-13 DIAGNOSIS — E1140 Type 2 diabetes mellitus with diabetic neuropathy, unspecified: Secondary | ICD-10-CM | POA: Insufficient documentation

## 2020-03-13 DIAGNOSIS — Z955 Presence of coronary angioplasty implant and graft: Secondary | ICD-10-CM | POA: Insufficient documentation

## 2020-03-13 DIAGNOSIS — E785 Hyperlipidemia, unspecified: Secondary | ICD-10-CM | POA: Diagnosis not present

## 2020-03-13 DIAGNOSIS — Z952 Presence of prosthetic heart valve: Secondary | ICD-10-CM

## 2020-03-13 NOTE — Patient Instructions (Addendum)
STOP Midodrine  Your provider has recommended that  you wear a Zio Patch for 14 days.  This monitor will record your heart rhythm for our review.  IF you have any symptoms while wearing the monitor please press the button.  If you have any issues with the patch or you notice a red or orange light on it please call the company at [phone].  Once you remove the patch please mail it back to the company as soon as possible so we can get the results.   Please wear your compression hose daily, place them on as soon as you get up in the morning and remove before you go to bed at night.  Your physician recommends that you follow up with Dr. Domenic Polite.  Follow up with [place] only if you need to.    If you have any questions or concerns before your next appointment please send [place] a message through [place] or call our office at [phone].    TO LEAVE A MESSAGE FOR THE NURSE SELECT OPTION 2, PLEASE LEAVE A MESSAGE INCLUDING:  YOUR NAME  DATE OF BIRTH  CALL BACK NUMBER  REASON FOR CALL**this is important as we prioritize the call backs  YOU WILL RECEIVE A CALL BACK THE SAME DAY AS LONG AS YOU CALL BEFORE 4:00 PM

## 2020-03-13 NOTE — Progress Notes (Signed)
Zio patch placed onto patient.  All instructions and information reviewed with patient, they verbalize understanding with no questions. 

## 2020-03-13 NOTE — Addendum Note (Signed)
Encounter addended by: Jolaine Artist, MD on: 03/13/2020 12:49 PM  Actions taken: Level of Service modified, Visit diagnoses modified

## 2020-03-13 NOTE — Progress Notes (Signed)
Advanced Heart Failure Clinic Note   Referring Physician: PCP: Cory Munch, PA-C PCP-Cardiologist: [name], MD  AHF: Dr. Haroldine Laws   HPI:  85 y/o male admitted 6/21 w/ NSTEMI. Found to have severe multivessel CAD and critical AS. Underwent CABG x 4 + bioprosthetic AVR. Intraoperativeecho w/ normal LVEF 50-55% but developed post cardiotomy shock requiring milrinone. Was able to wean off milrinone but required midodrine for pressor support. F/u echo showed EF up to 65% w/ G2DD. Once recovered from a surgical standpoint, he was discharged to [facility] on 7/6 for continued therapy. Ultimately discharged home on 7/17.  He presents to clinic today for f/u. Here w/ his daughter. Says he is doing ok but main complaint is legs sore when he stands up. Referred from home PT but he refused. At last visit torsemide reduced to 20 daily. Weight went up 9 pounds in 1 week. Saw [place] and increased torsemide to 40 daily. Weight back down. Takes BP at home regularly. BP mostly 120-130. Earieir this month HR shot up to 100 and SBP was 90. Felt "curious".  Mild RLE (site of SVG harvest)   Past Medical History:  Diagnosis Date  . Aortic stenosis   . Aortic stenosis    a. s/p AVR in 11/2019  . Coronary atherosclerosis of native coronary artery    a. s/p prior stenting of LAD b. cath in 11/2019 showing LM and 3-vessel CAD--> s/p CABG on 11/22/2019 with LIMA-LAD, SVG-dRCA and seq SVG-RI-OM1 and complicated by post-cardiotomy shock  . CVD (cerebrovascular disease)   . Essential hypertension   . GERD (gastroesophageal reflux disease)   . Hyperlipidemia   . MI (myocardial infarction) [facility])    October 1999  . Type 2 diabetes mellitus with diabetic neuropathy (HCC)     Current Outpatient Medications  Medication Sig Dispense Refill  . amoxicillin (AMOXIL) 500 MG capsule Take 4 capsules by mouth 30-60 minutes prior to dental work. 4 capsule 3  . aspirin EC 325 MG EC tablet Take 1 tablet (325 mg  total) by mouth daily. 30 tablet 0  . calcium citrate (CALCITRATE - DOSED IN MG ELEMENTAL CALCIUM) 950 (200 Ca) MG tablet Take 1 tablet (200 mg of elemental calcium total) by mouth daily. 30 tablet 0  . cephALEXin (KEFLEX) 500 MG capsule Take 4 capsules (2,000 mg) 30 to 60 minutes before dental procedure 4 capsule 0  . clindamycin (CLEOCIN) 300 MG capsule Take 300 mg by mouth. Prior to dental work    . docusate sodium (COLACE) 100 MG capsule Take 2 capsules (200 mg total) by mouth daily. 10 capsule 0  . glipiZIDE (GLUCOTROL) 5 MG tablet Take 1 tablet (5 mg total) by mouth daily before breakfast. 30 tablet 0  . levothyroxine (SYNTHROID) 50 MCG tablet Take 1 tablet (50 mcg total) by mouth daily before breakfast. 30 tablet 0  . midodrine (PROAMATINE) 5 MG tablet Take 1 tablet (5 mg total) by mouth 3 (three) times daily with meals. 270 tablet 3  . Multiple Vitamin (MULTIVITAMIN WITH MINERALS) TABS tablet Take 1 tablet by mouth daily.    . Multiple Vitamins-Minerals (PRESERVISION AREDS 2+MULTI VIT PO) Take by mouth.    . pantoprazole (PROTONIX) 40 MG tablet Take 1 tablet (40 mg total) by mouth daily. 30 tablet 0  . potassium chloride SA (KLOR-CON) 20 MEQ tablet Take 1 tablet (20 mEq total) by mouth daily. 90 tablet 3  . pravastatin (PRAVACHOL) 40 MG tablet Take 1 tablet (40 mg total) by mouth  every evening. 90 tablet 3  . torsemide (DEMADEX) 20 MG tablet Take 2 tablets (40 mg total) by mouth daily. 180 tablet 3  . vitamin B-12 (CYANOCOBALAMIN) 250 MCG tablet Take 1 tablet (250 mcg total) by mouth daily. 30 tablet 0   No current facility-administered medications for this encounter.    No Known Allergies    Social History   Socioeconomic History  . Marital status: Married    Spouse name: Not on file  . Number of children: Not on file  . Years of education: Not on file  . Highest education level: Not on file  Occupational History  . Not on file  Tobacco Use  . Smoking status: Former Smoker      Types: Pipe, Cigars    Quit date: 12/03/1948    Years since quitting: 71.3  . Smokeless tobacco: Current User    Types: Chew  . Tobacco comment: has been chewing tobacco for 60-70 years  Vaping Use  . Vaping Use: Never used  Substance and Sexual Activity  . Alcohol use: No  . Drug use: No  . Sexual activity: Not on file  Other Topics Concern  . Not on file  Social History Narrative  . Not on file   Social Determinants of Health   Financial Resource Strain:   . Difficulty of Paying Living Expenses: Not on file  Food Insecurity:   . Worried About Charity fundraiser in the Last Year: Not on file  . Ran Out of Food in the Last Year: Not on file  Transportation Needs:   . Lack of Transportation (Medical): Not on file  . Lack of Transportation (Non-Medical): Not on file  Physical Activity:   . Days of Exercise per Week: Not on file  . Minutes of Exercise per Session: Not on file  Stress:   . Feeling of Stress : Not on file  Social Connections:   . Frequency of Communication with Friends and Family: Not on file  . Frequency of Social Gatherings with Friends and Family: Not on file  . Attends Religious Services: Not on file  . Active Member of Clubs or Organizations: Not on file  . Attends Archivist Meetings: Not on file  . Marital Status: Not on file  Intimate Partner Violence:   . Fear of Current or Ex-Partner: Not on file  . Emotionally Abused: Not on file  . Physically Abused: Not on file  . Sexually Abused: Not on file      Family History  Problem Relation Age of Onset  . Coronary artery disease Father     Vitals:   03/13/20 1113  BP: 140/80  Pulse: 93  SpO2: 98%  Weight: 94.1 kg (207 lb 6 oz)    PHYSICAL EXAM: General:  Elderly. No resp difficulty HEENT: normal Neck: supple. no JVD. Carotids 2+ bilat; no bruits. No lymphadenopathy or thryomegaly appreciated. Cor: PMI nondisplaced. Regular rate & rhythm. 2/6 SEM LSB Lungs: clear Abdomen:  soft, nontender, nondistended. No hepatosplenomegaly. No bruits or masses. Good bowel sounds. Extremities: no cyanosis, clubbing, rash, 1+ edema on RLE  Large R inguinal hernia Neuro: alert & orientedx3, cranial nerves grossly intact. moves all 4 extremities w/o difficulty. Affect pleasant   ASSESSMENT & PLAN:  1.Severe Multivessel CAD - NSTEMI 6/21 s/p CABG - no s/s angina - continue ASA + statin  - switched from Crestor to pravastatin due to leg pains -> no improvement  2. Critical AS - s/p bioprosthetic AVR  at time of CABG 6/21 - stable prosthesis on post op echo  - aware of need for SBE prophylaxis w/ dental work   3. HFpEF - intraoperative ECHO: EF 50-55% - Developed postcardiotomy shock post CABG + AVR requiring milrinone  - Post op Echo 11/30/19 EF 60% RV ok - Doing well. NYHA Class II-III  - Volume status stable. Mild RLE venous stasis. -> encouraged compression hose  4. Hypotension - resolved - can stop midodrine  5. Palpitations/tachycardia - Place Zio Patch    6. Carotid stenosis - R carotid 60-79% - L carotid totally occluded - follows with Dr. Domenic Polite  7. DMII - followed by PCP - not on SGLT2i due to risk of volume depletion  8. R inguinal hernia - ok for surgery from my perspective  Doing well. Can graduate HF Clinic and f/u with [place].    Glori Bickers, MD 03/13/20

## 2020-04-04 DIAGNOSIS — R000 Tachycardia, unspecified: Secondary | ICD-10-CM | POA: Diagnosis not present

## 2020-04-05 NOTE — Addendum Note (Signed)
Encounter addended by: Micki Riley, RN on: 04/05/2020 9:03 AM  Actions taken: Imaging Exam ended

## 2020-04-18 ENCOUNTER — Encounter: Payer: Self-pay | Admitting: Cardiology

## 2020-04-18 NOTE — Progress Notes (Signed)
Cardiology Office Note  Date: 04/19/2020   William Walker, DOB [birth year], MRN [id number]  PCP:  Cory Munch, PA-C  Cardiologist:  [name], MD Electrophysiologist:  None   Chief Complaint  Patient presents with   Cardiac follow-up    History of Present Illness: William Walker is a 86 y.o. male last seen in August by Ms. Strader PA-C.  Interval follow-up noted in the heart failure clinic in October.  He is status post NSTEMI in June at which point he underwent CABG and bioprosthetic AVR in the setting of multivessel CAD and severe aortic stenosis.  He is here today with his daughter for a follow-up visit.  Reports weakness in his legs with some swelling, still a sense of palpitations that he describes as a "curious" feeling in his chest.  He has had no syncope.  Postoperative echocardiogram in June revealed LVEF 60 to 65% with grade 2 diastolic dysfunction, stable bioprosthetic AVR with mild aortic regurgitation, mean gradient 7 mmHg.  Event monitor performed recently showed sinus rhythm with transient nocturnal bradycardia, brief episode of NSVT and more notable episodes of PSVT the longest of which lasted for 45 minutes at an average heart rate of 115 bpm.  I discussed the results with him today. He had previously been on Toprol-XL.  Past Medical History:  Diagnosis Date   Aortic stenosis    a. s/p AVR in 11/2019   Coronary atherosclerosis of native coronary artery    a. s/p prior stenting of LAD b. cath in 11/2019 showing LM and 3-vessel CAD--> s/p CABG on 11/22/2019 with LIMA-LAD, SVG-dRCA and seq SVG-RI-OM1 and complicated by post-cardiotomy shock   CVD (cerebrovascular disease)    Essential hypertension    GERD (gastroesophageal reflux disease)    Hyperlipidemia    MI (myocardial infarction) [facility])    October 1999   Type 2 diabetes mellitus with diabetic neuropathy [facility])     Past Surgical History:  Procedure Laterality Date   AORTIC VALVE  REPLACEMENT N/A 11/22/2019   Procedure: AORTIC VALVE REPLACEMENT (AVR) USING INSPIRIS 23MM;  Surgeon: [name], MD;  Location: [place];  Service: Open Heart Surgery;  Laterality: N/A;   Arch cerebral ateriogram  12/11/2005   Arvilla Meres Early MD   CATARACT EXTRACTION, BILATERAL     CORONARY ARTERY BYPASS GRAFT N/A 11/22/2019   Procedure: CORONARY ARTERY BYPASS GRAFTING (CABG), ON PUMP, TIMES FOUR , USING LEFT INTERNAL MAMMARY ARTERY AND ENDOSCOPICALLY HARVESTED RIGHT GREATER SAPHENOUS VEIN;  Surgeon: [name], MD;  Location: [place];  Service: Open Heart Surgery;  Laterality: N/A;  SEQUENTIAL RAMUS INTERMEDIATE TO OM1 SAPHENOUS VEIN TO DISTAL PDA LIMA TO LAD   INGUINAL HERNIA REPAIR Left 12/09/2012   Procedure: HERNIA REPAIR INGUINAL with mesh;  Surgeon: [name], MD;  Location: [facility];  Service: General;  Laterality: Left;   INSERTION OF MESH Left 12/09/2012   Procedure: INSERTION OF MESH;  Surgeon: [name], MD;  Location: [facility];  Service: General;  Laterality: Left;   RIGHT/LEFT HEART CATH AND CORONARY ANGIOGRAPHY N/A 10/07/2017   Procedure: RIGHT/LEFT HEART CATH AND CORONARY ANGIOGRAPHY;  Surgeon: [name], MD;  Location: [place] [facility];  Service: Cardiovascular;  Laterality: N/A;   RIGHT/LEFT HEART CATH AND CORONARY ANGIOGRAPHY N/A 11/16/2019   Procedure: RIGHT/LEFT HEART CATH AND CORONARY ANGIOGRAPHY;  Surgeon: Lorretta Harp, MD;  Location: [place] [facility];  Service: Cardiovascular;  Laterality: N/A;   TEE WITHOUT CARDIOVERSION  N/A 11/22/2019   Procedure: TRANSESOPHAGEAL ECHOCARDIOGRAM (TEE);  Surgeon: [name], MD;  Location: [place];  Service: Open Heart Surgery;  Laterality: N/A;    Current Outpatient Medications  Medication Sig Dispense Refill   aspirin EC 325 MG EC tablet Take 1 tablet (325 mg total) by mouth daily. 30 tablet 0   calcium citrate (CALCITRATE - DOSED IN MG ELEMENTAL CALCIUM) 950 (200 Ca) MG tablet Take 1 tablet (200  mg of elemental calcium total) by mouth daily. 30 tablet 0   clindamycin (CLEOCIN) 300 MG capsule Take 300 mg by mouth. Prior to dental work     docusate sodium (COLACE) 100 MG capsule Take 2 capsules (200 mg total) by mouth daily. 10 capsule 0   glipiZIDE (GLUCOTROL) 5 MG tablet Take 1 tablet (5 mg total) by mouth daily before breakfast. 30 tablet 0   levothyroxine (SYNTHROID) 50 MCG tablet Take 1 tablet (50 mcg total) by mouth daily before breakfast. 30 tablet 0   Multiple Vitamin (MULTIVITAMIN WITH MINERALS) TABS tablet Take 1 tablet by mouth daily.     Multiple Vitamins-Minerals (PRESERVISION AREDS 2+MULTI VIT PO) Take by mouth.     pantoprazole (PROTONIX) 40 MG tablet Take 1 tablet (40 mg total) by mouth daily. 30 tablet 0   potassium chloride SA (KLOR-CON) 20 MEQ tablet Take 1 tablet (20 mEq total) by mouth daily. 90 tablet 3   pravastatin (PRAVACHOL) 40 MG tablet Take 1 tablet (40 mg total) by mouth every evening. 90 tablet 3   torsemide (DEMADEX) 20 MG tablet Take 2 tablets (40 mg total) by mouth daily. 180 tablet 3   vitamin B-12 (CYANOCOBALAMIN) 250 MCG tablet Take 1 tablet (250 mcg total) by mouth daily. 30 tablet 0   amoxicillin (AMOXIL) 500 MG capsule Take 4 capsules by mouth 30-60 minutes prior to dental work. (Patient not taking: Reported on 04/19/2020) 4 capsule 3   cephALEXin (KEFLEX) 500 MG capsule Take 4 capsules (2,000 mg) 30 to 60 minutes before dental procedure (Patient not taking: Reported on 04/19/2020) 4 capsule 0   metoprolol succinate (TOPROL XL) 25 MG 24 hr tablet Take 0.5 tablets (12.5 mg total) by mouth daily. 45 tablet 1   No current facility-administered medications for this visit.   Allergies:  Patient has no known allergies.   ROS: Neuropathy pain, recent dental work, leg weakness.  Physical Exam: VS:  BP (!) 150/70    Pulse 68    Ht 5\' 11"  (1.803 m)    Wt 211 lb (95.7 kg)    SpO2 98%    BMI 29.43 kg/m , BMI Body mass index is 29.43 kg/m.  Wt  Readings from Last 3 Encounters:  04/19/20 211 lb (95.7 kg)  03/13/20 207 lb 6 oz (94.1 kg)  01/18/20 210 lb (95.3 kg)    General: Elderly male, appears comfortable at rest. HEENT: Conjunctiva and lids normal, wearing a mask. Neck: Supple, no elevated JVP or carotid bruits. Lungs: Clear to auscultation, nonlabored breathing at rest. Cardiac: Regular rate and rhythm, no S3, soft systolic murmur, no pericardial rub. Extremities: Mild lower leg edema.  ECG:  An ECG dated 03/13/2020 was personally reviewed today and demonstrated:  Sinus rhythm with prolonged PR interval, nonspecific ST-T changes.  Recent Labwork: 11/15/2019: B Natriuretic Peptide 606.3 11/24/2019: TSH 1.349 12/08/2019: ALT 101; AST 88 12/10/2019: Magnesium 2.2 02/01/2020: BUN 26; Creatinine, Ser 1.48; Hemoglobin 11.5; Platelets 201; Potassium 3.8; Sodium 137     Component Value Date/Time   CHOL 166  11/16/2019 0026   CHOL 179 05/05/2018 1001   TRIG 88 11/16/2019 0026   TRIG 134 05/05/2018 1001   HDL 47 11/16/2019 0026   HDL 54 05/05/2018 1001   CHOLHDL 3.5 11/16/2019 0026   VLDL 18 11/16/2019 0026   LDLCALC 101 (H) 11/16/2019 0026    Other Studies Reviewed Today:  Echocardiogram 11/30/2019: 1. Left ventricular ejection fraction, by estimation, is 60 to 65%. The  left ventricle has normal function. The left ventricle has no regional  wall motion abnormalities. Left ventricular diastolic parameters are  consistent with Grade II diastolic  dysfunction (pseudonormalization). Elevated left atrial pressure.  2. Right ventricular systolic function was not well visualized. The right  ventricular size is normal. There is mildly elevated pulmonary artery  systolic pressure. The estimated right ventricular systolic pressure is  76.1 mmHg.  3. The mitral valve is normal in structure. Trivial mitral valve  regurgitation.  4. There is no perivalvular leak. The aortic valve has been  repaired/replaced. Aortic valve  regurgitation is mild. There is a  bioprosthetic valve present in the aortic position. Echo findings are  consistent with regurgitation of the aortic prosthesis.  Cardiac monitor November 2021: 1. Sinus rhythm - avg HR of 80 bpm. One episode of transient nocturnal bradycardia with HR down to 30 for a few beats. 2. First Degree AV Block was present with occasional episodes of Wenckebach. 3. One run of NSVT lasting 4 beats with a max rate of 116 bpm  4. 1211 Supraventricular Tachycardia runs occurred, the run with the fastest interval lasting 16 beats with a max rate of 169 bpm, the longest lasting 45 mins 19 secs with an avg rate of 115 bpm. 5. Rare PACs and PVCs.  Assessment and Plan:  1.  Severe multivessel CAD with critical aortic stenosis status post CABG and bioprosthetic AVR in June as discussed above.  Overall cardiac status has improved since that time, had follow-up in the heart failure clinic in October and released at that point.  LVEF 60 to 65% and plan is to continue medical therapy and observation.  Currently on aspirin and Pravachol.  Resuming beta-blocker as discussed below.  2.  Episodes of PSVT documented by cardiac monitor.  Plan to resume Toprol-XL starting at 12.5 mg daily.  Can uptitrate if necessary.  3.  Critical aortic stenosis status post bioprosthetic AVR.  Valve function normal by follow-up echocardiogram with mild aortic regurgitation and mean gradient 7 mmHg.  4.  Chronic diastolic heart failure, continue Demadex with potassium supplement.  Weight is up a few pounds.  Check BMET.  Medication Adjustments/Labs and Tests Ordered: Current medicines are reviewed at length with the patient today.  Concerns regarding medicines are outlined above.   Tests Ordered: Orders Placed This Encounter  Procedures   Basic metabolic panel   CBC    Medication Changes: Meds ordered this encounter  Medications   metoprolol succinate (TOPROL XL) 25 MG 24 hr tablet    Sig:  Take 0.5 tablets (12.5 mg total) by mouth daily.    Dispense:  45 tablet    Refill:  1    04/19/2020 NEW    Disposition:  Follow up 2 to 3 months in the [place] office.  Signed, Satira Sark, MD, [facility] 04/19/2020 2:40 PM    [facility] at [facility] [address], [place],  [address] Phone: [phone]; Fax: [phone]

## 2020-04-19 ENCOUNTER — Other Ambulatory Visit (HOSPITAL_COMMUNITY)
Admission: RE | Admit: 2020-04-19 | Discharge: 2020-04-19 | Disposition: A | Discharge status: Home / Self Care | Payer: Medicare Other | Source: Ambulatory Visit | Attending: Cardiology | Admitting: Cardiology

## 2020-04-19 ENCOUNTER — Encounter: Payer: Self-pay | Admitting: Cardiology

## 2020-04-19 ENCOUNTER — Ambulatory Visit: Payer: Medicare Other | Admitting: Cardiology

## 2020-04-19 ENCOUNTER — Other Ambulatory Visit: Payer: Self-pay

## 2020-04-19 VITALS — BP 150/70 | HR 68 | Ht 71.0 in | Wt 211.0 lb

## 2020-04-19 DIAGNOSIS — Z79899 Other long term (current) drug therapy: Secondary | ICD-10-CM | POA: Diagnosis not present

## 2020-04-19 DIAGNOSIS — I471 Supraventricular tachycardia, unspecified: Secondary | ICD-10-CM

## 2020-04-19 DIAGNOSIS — Z953 Presence of xenogenic heart valve: Secondary | ICD-10-CM

## 2020-04-19 DIAGNOSIS — I25119 Atherosclerotic heart disease of native coronary artery with unspecified angina pectoris: Secondary | ICD-10-CM | POA: Insufficient documentation

## 2020-04-19 LAB — BASIC METABOLIC PANEL
Anion gap: 9 (ref 5–15)
BUN: 24 mg/dL — ABNORMAL HIGH (ref 8–23)
CO2: 30 mmol/L (ref 22–32)
Calcium: 9.6 mg/dL (ref 8.9–10.3)
Chloride: 97 mmol/L — ABNORMAL LOW (ref 98–111)
Creatinine, Ser: 1.36 mg/dL — ABNORMAL HIGH (ref 0.61–1.24)
GFR, Estimated: 49 mL/min — ABNORMAL LOW (ref >60)
Glucose, Bld: 96 mg/dL (ref 70–99)
Potassium: 4.1 mmol/L (ref 3.5–5.1)
Sodium: 136 mmol/L (ref 135–145)

## 2020-04-19 LAB — CBC
HCT: 37.2 % — ABNORMAL LOW (ref 39.0–52.0)
Hemoglobin: 12 g/dL — ABNORMAL LOW (ref 13.0–17.0)
MCH: 32.2 pg (ref 26.0–34.0)
MCHC: 32.3 g/dL (ref 30.0–36.0)
MCV: 99.7 fL (ref 80.0–100.0)
Platelets: 234 10*3/uL (ref 150–400)
RBC: 3.73 MIL/uL — ABNORMAL LOW (ref 4.22–5.81)
RDW: 16.8 % — ABNORMAL HIGH (ref 11.5–15.5)
WBC: 6.5 10*3/uL (ref 4.0–10.5)
nRBC: 0 % (ref 0.0–0.2)

## 2020-04-19 MED ORDER — METOPROLOL SUCCINATE ER 25 MG PO TB24
12.5000 mg | ORAL_TABLET | Freq: Every day | ORAL | 1 refills | Status: DC
Start: 2020-04-19 — End: 2020-10-12

## 2020-04-19 NOTE — Patient Instructions (Addendum)
Medication Instructions:   Your physician has recommended you make the following change in your medication:   Start metoprolol succinate 12.5 mg by mouth daily  Continue other medications the same  Labwork:  Your physician recommends that you return for lab work today to check your BMET & CBC. Please have done at [facility].  Testing/Procedures:  None  Follow-Up:  Your physician recommends that you schedule a follow-up appointment in: 2-3 months.  Any Other Special Instructions Will Be Listed Below (If Applicable).  If you need a refill on your cardiac medications before your next appointment, please call your pharmacy.

## 2020-04-20 ENCOUNTER — Telehealth: Payer: Self-pay | Admitting: *Deleted

## 2020-04-20 NOTE — Telephone Encounter (Signed)
-----   Message from Satira Sark, MD sent at 04/19/2020  3:48 PM EST ----- Results reviewed.  Hemoglobin 12.0, increased from last check in August.  Creatinine is relatively stable at 1.36, potassium normal.  Continue with current plan.

## 2020-04-20 NOTE — Telephone Encounter (Signed)
Patient's daughter Kim informed. Copy sent to PCP 

## 2020-05-18 DIAGNOSIS — H353131 Nonexudative age-related macular degeneration, bilateral, early dry stage: Secondary | ICD-10-CM | POA: Diagnosis not present

## 2020-06-13 ENCOUNTER — Encounter (INDEPENDENT_AMBULATORY_CARE_PROVIDER_SITE_OTHER): Payer: Medicare Other | Admitting: Ophthalmology

## 2020-06-13 ENCOUNTER — Other Ambulatory Visit: Payer: Self-pay

## 2020-06-13 DIAGNOSIS — H353132 Nonexudative age-related macular degeneration, bilateral, intermediate dry stage: Secondary | ICD-10-CM

## 2020-06-13 DIAGNOSIS — I10 Essential (primary) hypertension: Secondary | ICD-10-CM

## 2020-06-13 DIAGNOSIS — H43813 Vitreous degeneration, bilateral: Secondary | ICD-10-CM | POA: Diagnosis not present

## 2020-06-13 DIAGNOSIS — H35033 Hypertensive retinopathy, bilateral: Secondary | ICD-10-CM | POA: Diagnosis not present

## 2020-06-15 ENCOUNTER — Encounter (INDEPENDENT_AMBULATORY_CARE_PROVIDER_SITE_OTHER): Payer: Self-pay | Admitting: Ophthalmology

## 2020-07-11 NOTE — Progress Notes (Signed)
Cardiology Office Note    Date:  07/12/2020   ID:  William Walker, DOB 05/24/1927, MRN [id number]  PCP:  Cory Munch, PA-C  Cardiologist: [name], MD   Advanced Heart Failure: Dr. Haroldine Laws  Chief Complaint  Patient presents with  . Follow-up    3 month visit    History of Present Illness:    William Walker is a 87 y.o. male with past medical history of CAD (s/p prior stenting of LAD, cath in 11/2019 showing LM and 3-vessel CAD--> s/p CABG on 11/22/2019 with LIMA-LAD, SVG-dRCA and seq SVG-RI-OM1 and complicated by post-cardiotomy shock), severe AS (s/p bioprosthetic AVR on 11/22/2019), HTN, HLD and hypothyroidism who presents to the office today for 82-month follow-up.   He was last examined by Dr. Domenic Polite in 04/2020 and had recently wore an event monitor which showed predominately sinus rhythm with one episode of transient nocturnal bradycardia with HR down to 30 for a few beats. He did have 1211 Supraventricular Tachycardia runs with the fastest interval lasting 16 beats with a max rate of 169 bpm and the longest lasting 45 mins 19 secs with an avg rate of 115 bpm. Given his SVT, it was recommended to restart Toprol-XL 12.5mg  daily and could titrate as needed.   In talking with the patient and his daughter today, he reports overall doing well since his last visit. He does have intermittent episodes of dyspnea which can occur at rest or with activity. Feels like this has been worse over the winter months and he does have a woodstove in his workshop which could be contributing. He denies any associated chest pain or palpitations. No recent orthopnea or PND. He does have chronic lower extremity edema and reports good compliance with his diuretic but he did not take it this morning due to having breakfast with friends and coming to his office visit this afternoon.   Past Medical History:  Diagnosis Date  . Aortic stenosis    a. s/p AVR in 11/2019  . Coronary atherosclerosis of  native coronary artery    a. s/p prior stenting of LAD b. cath in 11/2019 showing LM and 3-vessel CAD--> s/p CABG on 11/22/2019 with LIMA-LAD, SVG-dRCA and seq SVG-RI-OM1 and complicated by post-cardiotomy shock  . CVD (cerebrovascular disease)   . Essential hypertension   . GERD (gastroesophageal reflux disease)   . Hyperlipidemia   . MI (myocardial infarction) [facility])    October 1999  . Type 2 diabetes mellitus with diabetic neuropathy [facility])     Past Surgical History:  Procedure Laterality Date  . AORTIC VALVE REPLACEMENT N/A 11/22/2019   Procedure: AORTIC VALVE REPLACEMENT (AVR) USING INSPIRIS 23MM;  Surgeon: [name], MD;  Location: [place];  Service: Open Heart Surgery;  Laterality: N/A;  . Arch cerebral ateriogram  12/11/2005   Rosetta Posner MD  . CATARACT EXTRACTION, BILATERAL    . CORONARY ARTERY BYPASS GRAFT N/A 11/22/2019   Procedure: CORONARY ARTERY BYPASS GRAFTING (CABG), ON PUMP, TIMES FOUR , USING LEFT INTERNAL MAMMARY ARTERY AND ENDOSCOPICALLY HARVESTED RIGHT GREATER SAPHENOUS VEIN;  Surgeon: [name], MD;  Location: [place];  Service: Open Heart Surgery;  Laterality: N/A;  SEQUENTIAL RAMUS INTERMEDIATE TO OM1 SAPHENOUS VEIN TO DISTAL PDA LIMA TO LAD  . INGUINAL HERNIA REPAIR Left 12/09/2012   Procedure: HERNIA REPAIR INGUINAL with mesh;  Surgeon: [name], MD;  Location: [facility];  Service: General;  Laterality: Left;  . INSERTION OF MESH Left 12/09/2012  Procedure: INSERTION OF MESH;  Surgeon: [name], MD;  Location: [facility];  Service: General;  Laterality: Left;  . RIGHT/LEFT HEART CATH AND CORONARY ANGIOGRAPHY N/A 10/07/2017   Procedure: RIGHT/LEFT HEART CATH AND CORONARY ANGIOGRAPHY;  Surgeon: [name], MD;  Location: [place] [facility];  Service: Cardiovascular;  Laterality: N/A;  . RIGHT/LEFT HEART CATH AND CORONARY ANGIOGRAPHY N/A 11/16/2019   Procedure: RIGHT/LEFT HEART CATH AND CORONARY ANGIOGRAPHY;  Surgeon: Lorretta Harp, MD;  Location:  [place] CV LAB;  Service: Cardiovascular;  Laterality: N/A;  . TEE WITHOUT CARDIOVERSION N/A 11/22/2019   Procedure: TRANSESOPHAGEAL ECHOCARDIOGRAM (TEE);  Surgeon: [name], MD;  Location: [place];  Service: Open Heart Surgery;  Laterality: N/A;    Current Medications: Outpatient Medications Prior to Visit  Medication Sig Dispense Refill  . calcium citrate (CALCITRATE - DOSED IN MG ELEMENTAL CALCIUM) 950 (200 Ca) MG tablet Take 1 tablet (200 mg of elemental calcium total) by mouth daily. (Patient taking differently: Take 200 mg of elemental calcium by mouth 3 (three) times a week.) 30 tablet 0  . clindamycin (CLEOCIN) 300 MG capsule Take 300 mg by mouth. Prior to dental work    . docusate sodium (COLACE) 100 MG capsule Take 2 capsules (200 mg total) by mouth daily. (Patient taking differently: Take 100 mg by mouth daily.) 10 capsule 0  . glipiZIDE (GLUCOTROL) 5 MG tablet Take 1 tablet (5 mg total) by mouth daily before breakfast. 30 tablet 0  . levothyroxine (SYNTHROID) 50 MCG tablet Take 1 tablet (50 mcg total) by mouth daily before breakfast. 30 tablet 0  . metoprolol succinate (TOPROL XL) 25 MG 24 hr tablet Take 0.5 tablets (12.5 mg total) by mouth daily. 45 tablet 1  . Multiple Vitamin (MULTIVITAMIN WITH MINERALS) TABS tablet Take 1 tablet by mouth daily.    . Multiple Vitamins-Minerals (PRESERVISION AREDS 2+MULTI VIT PO) Take by mouth.    . pantoprazole (PROTONIX) 40 MG tablet Take 1 tablet (40 mg total) by mouth daily. 30 tablet 0  . potassium chloride SA (KLOR-CON) 20 MEQ tablet Take 1 tablet (20 mEq total) by mouth daily. 90 tablet 3  . pravastatin (PRAVACHOL) 40 MG tablet Take 1 tablet (40 mg total) by mouth every evening. 90 tablet 3  . torsemide (DEMADEX) 20 MG tablet Take 2 tablets (40 mg total) by mouth daily. 180 tablet 3  . vitamin B-12 (CYANOCOBALAMIN) 250 MCG tablet Take 1 tablet (250 mcg total) by mouth daily. 30 tablet 0  . aspirin EC 325 MG EC tablet Take 1 tablet  (325 mg total) by mouth daily. 30 tablet 0  . amoxicillin (AMOXIL) 500 MG capsule Take 4 capsules by mouth 30-60 minutes prior to dental work. (Patient not taking: Reported on 04/19/2020) 4 capsule 3  . cephALEXin (KEFLEX) 500 MG capsule Take 4 capsules (2,000 mg) 30 to 60 minutes before dental procedure (Patient not taking: No sig reported) 4 capsule 0   No facility-administered medications prior to visit.     Allergies:   Patient has no known allergies.   Social History   Socioeconomic History  . Marital status: Married    Spouse name: Not on file  . Number of children: Not on file  . Years of education: Not on file  . Highest education level: Not on file  Occupational History  . Not on file  Tobacco Use  . Smoking status: Former Smoker    Types: Pipe, Cigars    Quit date: 12/03/1948  Years since quitting: 71.6  . Smokeless tobacco: Current User    Types: Chew  . Tobacco comment: has been chewing tobacco for 60-70 years  Vaping Use  . Vaping Use: Never used  Substance and Sexual Activity  . Alcohol use: No  . Drug use: No  . Sexual activity: Not on file  Other Topics Concern  . Not on file  Social History Narrative  . Not on file   Social Determinants of Health   Financial Resource Strain: Not on file  Food Insecurity: Not on file  Transportation Needs: Not on file  Physical Activity: Not on file  Stress: Not on file  Social Connections: Not on file     Family History:  The patient's family history includes Coronary artery disease in his father.   Review of Systems:   Please see the history of present illness.     General:  No chills, fever, night sweats or weight changes.  Cardiovascular:  No orthopnea, palpitations, paroxysmal nocturnal dyspnea. Positive for edema.  Dermatological: No rash, lesions/masses Respiratory: No cough.  Urologic: No hematuria, dysuria Abdominal:   No nausea, vomiting, diarrhea, bright red blood per rectum, melena, or  hematemesis Neurologic:  No visual changes, wkns, changes in mental status. All other systems reviewed and are otherwise negative except as noted above.   Physical Exam:    VS:  BP (!) 148/72   Pulse 68   Ht 5' 10.5" (1.791 m)   Wt 213 lb (96.6 kg)   SpO2 96%   BMI 30.13 kg/m    General: Well developed, well nourished,male appearing in no acute distress. Head: Normocephalic, atraumatic. Neck: No carotid bruits. JVD not elevated.  Lungs: Respirations regular and unlabored, without wheezes or rales.  Heart: Regular rate and rhythm. No S3 or S4.  Soft flow murmur along RUSB.  Abdomen: Appears non-distended. No obvious abdominal masses. Msk:  Strength and tone appear normal for age. No obvious joint deformities or effusions. Extremities: No clubbing or cyanosis. 1+ pitting edema bilaterally.  Distal pedal pulses are 2+ bilaterally. Neuro: Alert and oriented X 3. Moves all extremities spontaneously. No focal deficits noted. Psych:  Responds to questions appropriately with a normal affect. Skin: No rashes or lesions noted  Wt Readings from Last 3 Encounters:  07/12/20 213 lb (96.6 kg)  04/19/20 211 lb (95.7 kg)  03/13/20 207 lb 6 oz (94.1 kg)     Studies/Labs Reviewed:   EKG:  EKG is not ordered today.    Recent Labs: 11/15/2019: B Natriuretic Peptide 606.3 11/24/2019: TSH 1.349 12/08/2019: ALT 101 12/10/2019: Magnesium 2.2 04/19/2020: BUN 24; Creatinine, Ser 1.36; Hemoglobin 12.0; Platelets 234; Potassium 4.1; Sodium 136   Lipid Panel    Component Value Date/Time   CHOL 166 11/16/2019 0026   CHOL 179 05/05/2018 1001   TRIG 88 11/16/2019 0026   TRIG 134 05/05/2018 1001   HDL 47 11/16/2019 0026   HDL 54 05/05/2018 1001   CHOLHDL 3.5 11/16/2019 0026   VLDL 18 11/16/2019 0026   LDLCALC 101 (H) 11/16/2019 0026    Additional studies/ records that were reviewed today include:   Limited Echo: 11/2019 IMPRESSIONS    1. Left ventricular ejection fraction, by estimation, is  60 to 65%. The  left ventricle has normal function. The left ventricle has no regional  wall motion abnormalities. Left ventricular diastolic parameters are  consistent with Grade II diastolic  dysfunction (pseudonormalization). Elevated left atrial pressure.  2. Right ventricular systolic function was not well  visualized. The right  ventricular size is normal. There is mildly elevated pulmonary artery  systolic pressure. The estimated right ventricular systolic pressure is  78.4 mmHg.  3. The mitral valve is normal in structure. Trivial mitral valve  regurgitation.  4. There is no perivalvular leak. The aortic valve has been  repaired/replaced. Aortic valve regurgitation is mild. There is a  bioprosthetic valve present in the aortic position. Echo findings are  consistent with regurgitation of the aortic prosthesis.   Comparison(s): Prior images reviewed side by side. The left ventricular  function has improved. The left ventricular wall motion has improved. The  right ventricle is poorly seen on this study and its systolic function  cannot be assessed.   Event Monitor: 04/2020 1. Sinus rhythm - avg HR of 80 bpm. One episode of transient nocturnal bradycardia with HR down to 30 for a few beats. 2. First Degree AV Block was present with occasional episodes of Wenckebach. 3. One run of NSVT lasting 4 beats with a max rate of 116 bpm  4. 1211 Supraventricular Tachycardia runs occurred, the run with the fastest interval lasting 16 beats with a max rate of 169 bpm, the longest lasting 45 mins 19 secs with an avg rate of 115 bpm. 5. Rare PACs and PVCs   Assessment:    1. Coronary artery disease involving native coronary artery of native heart without angina pectoris   2. S/P AVR   3. Chronic diastolic heart failure ([place])   4. PSVT (paroxysmal supraventricular tachycardia) ([place])   5. Essential hypertension      Plan:   In order of problems listed above:  1. CAD - He is s/p  prior stenting of LAD and cath in 11/2019 showed LM and 3-vessel CAD--> s/p CABG on 11/22/2019 with LIMA-LAD, SVG-dRCA and seq SVG-RI-OM1. - He does experience intermittent dyspnea but denies any associated chest pain.  - Continue ASA (will reduce from 325mg  daily to 81mg  daily), Toprol-XL 12.5mg  daily and Pravastatin 40mg  daily.  2. Severe AS - He is s/p bioprosthetic AVR on 11/22/2019. Would anticipate obtaining a repeat echo later this year. No significant murmur by examination today.   3. Chronic Diastolic CHF - His lungs are clear on examination but he does have 1+ pitting edema. He did not take his Torsemide today and had a high-sodium breakfast. I encouraged him to elevate his legs when able and to wear compression stockings. Will continue Torsemide 40mg  daily for now and he is aware he can take an extra tablet if needed for weight gain > 2 lbs overnight or > 5 lbs in one week.   4. SVT - Recent monitor showed frequent episodes of SVT as outlined above. He denies any recent palpitations and his daughter reports his resting HR has improved from the 80's to 90's into the 60's-70's. Will continue Toprol-XL 12.5mg  daily. Will not titrate given HR in the 60's at times.   5. HTN - BP is elevated at 148/72 during today's visit but has overall been well-controlled at home. Given he did not take all of his medications this morning, will continue his current regimen for now.    Medication Adjustments/Labs and Tests Ordered: Current medicines are reviewed at length with the patient today.  Concerns regarding medicines are outlined above.  Medication changes, Labs and Tests ordered today are listed in the Patient Instructions below. Patient Instructions  Medication Instructions:  DCREASE Aspirin to 81 mg daily (new prescription has been sent in) *If you need  a refill on your cardiac medications before your next appointment, please call your pharmacy*   Lab Work: None Today If you have labs (blood  work) drawn today and your tests are completely normal, you will receive your results only by: Marland Kitchen MyChart Message (if you have MyChart) OR . A paper copy in the mail If you have any lab test that is abnormal or we need to change your treatment, we will call you to review the results.   Testing/Procedures: None TOday   Follow-Up: At [facility], you and your health needs are our priority.  As part of our continuing mission to provide you with exceptional heart care, we have created designated Provider Care Teams.  These Care Teams include your primary Cardiologist (physician) and Advanced Practice Providers (APPs -  Physician Assistants and Nurse Practitioners) who all work together to provide you with the care you need, when you need it.  We recommend signing up for the patient portal called "MyChart".  Sign up information is provided on this After Visit Summary.  MyChart is used to connect with patients for Virtual Visits (Telemedicine).  Patients are able to view lab/test results, encounter notes, upcoming appointments, etc.  Non-urgent messages can be sent to your provider as well.   To learn more about what you can do with MyChart, go to [email or web address].    Your next appointment:   4-5 month(s)  The format for your next appointment:   In Person  Provider:   [name], MD   Other Instructions None Today     Signed, Erma Heritage, PA-C  07/12/2020 8:26 PM    [place]. [address] [place], [place] [address] Phone: [phone] Fax: [phone]

## 2020-07-12 ENCOUNTER — Other Ambulatory Visit: Payer: Self-pay

## 2020-07-12 ENCOUNTER — Ambulatory Visit: Payer: Medicare Other | Admitting: Student

## 2020-07-12 ENCOUNTER — Encounter: Payer: Self-pay | Admitting: Student

## 2020-07-12 VITALS — BP 148/72 | HR 68 | Ht 70.5 in | Wt 213.0 lb

## 2020-07-12 DIAGNOSIS — I2510 Atherosclerotic heart disease of native coronary artery without angina pectoris: Secondary | ICD-10-CM

## 2020-07-12 DIAGNOSIS — Z952 Presence of prosthetic heart valve: Secondary | ICD-10-CM | POA: Diagnosis not present

## 2020-07-12 DIAGNOSIS — I5032 Chronic diastolic (congestive) heart failure: Secondary | ICD-10-CM

## 2020-07-12 DIAGNOSIS — I10 Essential (primary) hypertension: Secondary | ICD-10-CM | POA: Diagnosis not present

## 2020-07-12 DIAGNOSIS — I471 Supraventricular tachycardia: Secondary | ICD-10-CM | POA: Diagnosis not present

## 2020-07-12 MED ORDER — ASPIRIN EC 81 MG PO TBEC: 81.0000 mg | DELAYED_RELEASE_TABLET | Freq: Every day | ORAL | 3 refills | Status: DC

## 2020-07-12 NOTE — Patient Instructions (Signed)
Medication Instructions:  DCREASE Aspirin to 81 mg daily (new prescription has been sent in) *If you need a refill on your cardiac medications before your next appointment, please call your pharmacy*   Lab Work: None Today If you have labs (blood work) drawn today and your tests are completely normal, you will receive your results only by: Marland Kitchen MyChart Message (if you have MyChart) OR . A paper copy in the mail If you have any lab test that is abnormal or we need to change your treatment, we will call you to review the results.   Testing/Procedures: None TOday   Follow-Up: At [facility], you and your health needs are our priority.  As part of our continuing mission to provide you with exceptional heart care, we have created designated Provider Care Teams.  These Care Teams include your primary Cardiologist (physician) and Advanced Practice Providers (APPs -  Physician Assistants and Nurse Practitioners) who all work together to provide you with the care you need, when you need it.  We recommend signing up for the patient portal called "MyChart".  Sign up information is provided on this After Visit Summary.  MyChart is used to connect with patients for Virtual Visits (Telemedicine).  Patients are able to view lab/test results, encounter notes, upcoming appointments, etc.  Non-urgent messages can be sent to your provider as well.   To learn more about what you can do with MyChart, go to [email or web address].    Your next appointment:   4-5 month(s)  The format for your next appointment:   In Person  Provider:   [name], MD   Other Instructions None Today

## 2020-08-21 DIAGNOSIS — E119 Type 2 diabetes mellitus without complications: Secondary | ICD-10-CM | POA: Diagnosis not present

## 2020-08-21 DIAGNOSIS — R609 Edema, unspecified: Secondary | ICD-10-CM | POA: Diagnosis not present

## 2020-08-21 DIAGNOSIS — I5033 Acute on chronic diastolic (congestive) heart failure: Secondary | ICD-10-CM | POA: Diagnosis not present

## 2020-08-21 DIAGNOSIS — Z1389 Encounter for screening for other disorder: Secondary | ICD-10-CM | POA: Diagnosis not present

## 2020-08-21 DIAGNOSIS — I700 Atherosclerosis of aorta: Secondary | ICD-10-CM | POA: Diagnosis not present

## 2020-08-21 DIAGNOSIS — R739 Hyperglycemia, unspecified: Secondary | ICD-10-CM | POA: Diagnosis not present

## 2020-08-21 DIAGNOSIS — I872 Venous insufficiency (chronic) (peripheral): Secondary | ICD-10-CM | POA: Diagnosis not present

## 2020-08-21 DIAGNOSIS — Z0001 Encounter for general adult medical examination with abnormal findings: Secondary | ICD-10-CM | POA: Diagnosis not present

## 2020-08-21 DIAGNOSIS — I214 Non-ST elevation (NSTEMI) myocardial infarction: Secondary | ICD-10-CM | POA: Diagnosis not present

## 2020-08-21 DIAGNOSIS — E1165 Type 2 diabetes mellitus with hyperglycemia: Secondary | ICD-10-CM | POA: Diagnosis not present

## 2020-08-21 DIAGNOSIS — E039 Hypothyroidism, unspecified: Secondary | ICD-10-CM | POA: Diagnosis not present

## 2020-08-21 DIAGNOSIS — E118 Type 2 diabetes mellitus with unspecified complications: Secondary | ICD-10-CM | POA: Diagnosis not present

## 2020-08-21 DIAGNOSIS — E7849 Other hyperlipidemia: Secondary | ICD-10-CM | POA: Diagnosis not present

## 2020-09-13 DIAGNOSIS — Z79899 Other long term (current) drug therapy: Secondary | ICD-10-CM | POA: Diagnosis not present

## 2020-09-18 DIAGNOSIS — E860 Dehydration: Secondary | ICD-10-CM | POA: Diagnosis not present

## 2020-09-26 ENCOUNTER — Other Ambulatory Visit: Payer: Self-pay | Admitting: Family Medicine

## 2020-09-26 DIAGNOSIS — K4090 Unilateral inguinal hernia, without obstruction or gangrene, not specified as recurrent: Secondary | ICD-10-CM

## 2020-10-11 ENCOUNTER — Other Ambulatory Visit: Payer: Self-pay | Admitting: Cardiology

## 2020-10-17 ENCOUNTER — Ambulatory Visit: Payer: Medicare Other | Admitting: General Surgery

## 2020-10-18 DIAGNOSIS — E119 Type 2 diabetes mellitus without complications: Secondary | ICD-10-CM | POA: Diagnosis not present

## 2020-10-18 DIAGNOSIS — Z79899 Other long term (current) drug therapy: Secondary | ICD-10-CM | POA: Diagnosis not present

## 2020-10-25 ENCOUNTER — Ambulatory Visit: Payer: Self-pay | Admitting: Surgery

## 2020-10-25 DIAGNOSIS — K4030 Unilateral inguinal hernia, with obstruction, without gangrene, not specified as recurrent: Secondary | ICD-10-CM | POA: Diagnosis not present

## 2020-11-01 ENCOUNTER — Telehealth: Payer: Self-pay | Admitting: Cardiology

## 2020-11-01 ENCOUNTER — Other Ambulatory Visit: Payer: Self-pay | Admitting: Student

## 2020-11-01 DIAGNOSIS — Z01818 Encounter for other preprocedural examination: Secondary | ICD-10-CM

## 2020-11-01 DIAGNOSIS — Z952 Presence of prosthetic heart valve: Secondary | ICD-10-CM

## 2020-11-01 DIAGNOSIS — I2510 Atherosclerotic heart disease of native coronary artery without angina pectoris: Secondary | ICD-10-CM

## 2020-11-01 NOTE — Telephone Encounter (Signed)
Per Dr. Myles Gip recommendation, will plan for carotid duplex and echocardiogram prior to surgery. Carotid duplex already scheduled for 11/23/20. Will route to preop callback team for assistance in scheduling echocardiogram.   William Dubonnet, NP

## 2020-11-01 NOTE — Telephone Encounter (Signed)
    William Walker DOB:  [birth year]  MRN:  [id number]   Primary Cardiologist: [name], MD  Chart reviewed as part of pre-operative protocol coverage. Mr.  William Walker has a hc of CAD (prior stenting LAD, s/p CABG 11/22/19), severe AS s/p bioprosthetic AVR 11/22/19, HTN, HLD, hypothyroidism, carotid artery disease. 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   Previous carotid duplex 11/2019 with "Velocities in the right ICA are consistent with a 60-79% stenosis. When compared to prior study, right ICA stenosis has progressed.  Evidence consistent with a total occlusion of the left ICA. Right vertebral artery demonstrates antegrade flow. Left vertebral artery was not visualized.  Normal flow hemodynamics were seen in bilateral subclavian arteries. " He has repeat carotid duplex scheduled for 11/23/20.  Will route note to Dr. Domenic Polite if he would like updated echo prior to providing clearance. Will additionally request recommendations regarding Aspirin.   Loel Dubonnet, NP 11/01/2020, 11:13 AM

## 2020-11-01 NOTE — Telephone Encounter (Signed)
   [facility] Pre-operative Risk Assessment    HEARTCARE STAFF: - Please ensure there is not already an duplicate clearance open for this procedure. - Under Visit Info/Reason for Call, type in Other and utilize the format Clearance MM/DD/YY or Clearance TBD. Do not use dashes or single digits. - If request is for dental extraction, please clarify the # of teeth to be extracted.  Request for surgical clearance:  What type of surgery is being performed? Large Right Inguinal Hernia Repair  1. When is this surgery scheduled? TBD  2. What type of clearance is required (medical clearance vs. Pharmacy clearance to hold med vs. Both)? Both  3. Are there any medications that need to be held prior to surgery and how long? Please Advise  4. Practice name and name of physician performing surgery? Dr. Armandina Gemma from [facility]  5. What is the office phone number? [phone]   7.   What is the office fax number? [phone]  8.   Anesthesia type (None, local, MAC, general) ? General   Nelda Marseille 11/01/2020, 9:56 AM  _________________________________________________________________   (provider comments below)

## 2020-11-01 NOTE — Telephone Encounter (Signed)
William Walker has done relatively well since cardiac surgery last summer.  Having said that, his RCRI cardiac risk calculator places him in the high risk range, class IV with 11% chance of major adverse cardiac event.  This is not a modifiable risk.  I would suggest that he have his carotids and echocardiogram followed up on prior to proceeding with surgery.  If both cardiac structure and function and degree of carotid artery disease are stable, he could consider proceeding with high risk surgery without further cardiac testing.  Aspirin will need to be held 7 days prior to surgery.

## 2020-11-02 ENCOUNTER — Other Ambulatory Visit: Payer: Self-pay | Admitting: *Deleted

## 2020-11-02 DIAGNOSIS — Z01818 Encounter for other preprocedural examination: Secondary | ICD-10-CM

## 2020-11-02 DIAGNOSIS — I6523 Occlusion and stenosis of bilateral carotid arteries: Secondary | ICD-10-CM

## 2020-11-02 NOTE — Telephone Encounter (Signed)
I had sent a message as well to [facility] scheduling in regards to testing appts for pre op. See notes from yesterday 6/1.

## 2020-11-02 NOTE — Telephone Encounter (Signed)
Forwarded to pre op as to update

## 2020-11-02 NOTE — Telephone Encounter (Signed)
I s/w scheduler William Walker. In regards to testing appts for the pt. Per jean she s/w the pt and his daughter who state: they are gonna be changing the date of surgery , his daughter is William Walker be out of town, so they said keep the carotid for 6/7 and the echo for 6/17.  Order for the carotid has been place today as well to be done at [facility] .

## 2020-11-07 ENCOUNTER — Ambulatory Visit (HOSPITAL_COMMUNITY)
Admission: RE | Admit: 2020-11-07 | Discharge: 2020-11-07 | Disposition: A | Discharge status: Home / Self Care | Payer: Medicare Other | Source: Ambulatory Visit | Attending: Cardiology | Admitting: Cardiology

## 2020-11-07 ENCOUNTER — Other Ambulatory Visit: Payer: Self-pay

## 2020-11-07 DIAGNOSIS — Z01818 Encounter for other preprocedural examination: Secondary | ICD-10-CM | POA: Diagnosis not present

## 2020-11-07 DIAGNOSIS — I6523 Occlusion and stenosis of bilateral carotid arteries: Secondary | ICD-10-CM | POA: Diagnosis not present

## 2020-11-07 DIAGNOSIS — I6502 Occlusion and stenosis of left vertebral artery: Secondary | ICD-10-CM | POA: Diagnosis not present

## 2020-11-09 ENCOUNTER — Telehealth: Payer: Self-pay

## 2020-11-09 DIAGNOSIS — I6523 Occlusion and stenosis of bilateral carotid arteries: Secondary | ICD-10-CM

## 2020-11-09 NOTE — Telephone Encounter (Signed)
I spoke with patient and wife. Results of test discussed with them. I placed referral to [facility] as Urgent consult

## 2020-11-09 NOTE — Telephone Encounter (Signed)
-----   Message from Satira Sark, MD sent at 11/07/2020  7:40 PM EDT ----- Results reviewed.  Carotid Dopplers show chronic occlusion of the LICA and severe stenosis of the proximal RICA which is progressed compared to the last study in June of last year.  Please get him consultation with [facility] as soon as possible for further discussion.  Continue medical therapy.

## 2020-11-17 ENCOUNTER — Ambulatory Visit (HOSPITAL_COMMUNITY)
Admission: RE | Admit: 2020-11-17 | Discharge: 2020-11-17 | Disposition: A | Discharge status: Home / Self Care | Payer: Medicare Other | Source: Ambulatory Visit | Attending: Cardiology | Admitting: Cardiology

## 2020-11-17 ENCOUNTER — Other Ambulatory Visit: Payer: Self-pay

## 2020-11-17 DIAGNOSIS — Z952 Presence of prosthetic heart valve: Secondary | ICD-10-CM

## 2020-11-17 DIAGNOSIS — I2510 Atherosclerotic heart disease of native coronary artery without angina pectoris: Secondary | ICD-10-CM

## 2020-11-17 DIAGNOSIS — Z01818 Encounter for other preprocedural examination: Secondary | ICD-10-CM

## 2020-11-17 LAB — ECHOCARDIOGRAM COMPLETE
AR max vel: 2.13 cm2
AV Area VTI: 2.16 cm2
AV Area mean vel: 2.23 cm2
AV Mean grad: 6 mmHg
AV Peak grad: 11.6 mmHg
Ao pk vel: 1.7 m/s
Area-P 1/2: 6.17 cm2
MV M vel: 2.53 m/s
MV Peak grad: 25.6 mmHg
S' Lateral: 2.6 cm

## 2020-11-17 NOTE — Progress Notes (Signed)
*  PRELIMINARY RESULTS* Echocardiogram 2D Echocardiogram has been performed.  William Walker 11/17/2020, 4:09 PM

## 2020-11-22 NOTE — Telephone Encounter (Signed)
Waiting for [facility] appt on 7/13 prior to preop clearance. I adjusted appt notes for preop clearance.

## 2020-12-06 NOTE — Telephone Encounter (Signed)
   Patient Name: William Walker  DOB: [birth year]  MRN: [id number]   Primary Cardiologist: [name], MD  Chart reviewed as part of pre-op protocol, coming on board the pool today. Per chart review, patient is awaiting appointment with vascular surgery on 12/13/20 to address severe carotid stenosis before clearing for hernia surgery as outlined below. Will route this update to the requesting party so they are aware.   Charlie Pitter, PA-C 12/06/2020, 10:39 AM

## 2020-12-13 ENCOUNTER — Encounter: Payer: Self-pay | Admitting: Vascular Surgery

## 2020-12-13 ENCOUNTER — Ambulatory Visit: Payer: Medicare Other | Admitting: Vascular Surgery

## 2020-12-13 ENCOUNTER — Other Ambulatory Visit: Payer: Self-pay

## 2020-12-13 VITALS — BP 121/63 | HR 70 | Temp 97.9°F | Ht 70.5 in | Wt 192.0 lb

## 2020-12-13 DIAGNOSIS — I6523 Occlusion and stenosis of bilateral carotid arteries: Secondary | ICD-10-CM

## 2020-12-13 NOTE — Progress Notes (Signed)
Vascular and Vein Specialist of [place]  Patient name: William Walker MRN: [id number] DOB: [birth year] Sex: male  REASON FOR CONSULT: Evaluation carotid disease  HPI: William Walker is a 87 y.o. male, who is here today for discussion of extracranial cerebrovascular occlusive disease.  He is here today with his daughter who is a Marine scientist in the PACU at [facility].  He is a very active, alert 87 year old gentleman.  He has a very large right inguinal hernia and is scheduled for hernia repair.  He has known carotid disease and is seen today for discussion of this and also for preoperative clearance.  He has had a known occlusion of his left internal carotid artery for 8 to 10 years.  He has had some progression of stenosis in his right internal carotid.  He specifically denies any prior history of amaurosis fugax, aphasia or stroke.  He did undergo uneventful coronary artery bypass grafting and aortic valve replacement 1 year ago.  Past Medical History:  Diagnosis Date   Aortic stenosis    a. s/p AVR in 11/2019   Coronary atherosclerosis of native coronary artery    a. s/p prior stenting of LAD b. cath in 11/2019 showing LM and 3-vessel CAD--> s/p CABG on 11/22/2019 with LIMA-LAD, SVG-dRCA and seq SVG-RI-OM1 and complicated by post-cardiotomy shock   CVD (cerebrovascular disease)    Essential hypertension    GERD (gastroesophageal reflux disease)    Hyperlipidemia    MI (myocardial infarction) ([place])    October 1999   Type 2 diabetes mellitus with diabetic neuropathy (HCC)     Family History  Problem Relation Age of Onset   Coronary artery disease Father     SOCIAL HISTORY: Social History   Socioeconomic History   Marital status: Married    Spouse name: Not on file   Number of children: Not on file   Years of education: Not on file   Highest education level: Not on file  Occupational History   Not on file  Tobacco Use   Smoking status:  Former    Pack years: 0.00    Types: Pipe, Cigars    Quit date: 12/03/1948    Years since quitting: 72.0   Smokeless tobacco: Current    Types: Chew   Tobacco comments:    has been chewing tobacco for 60-70 years  Vaping Use   Vaping Use: Never used  Substance and Sexual Activity   Alcohol use: No   Drug use: No   Sexual activity: Not on file  Other Topics Concern   Not on file  Social History Narrative   Not on file   Social Determinants of Health   Financial Resource Strain: Not on file  Food Insecurity: Not on file  Transportation Needs: Not on file  Physical Activity: Not on file  Stress: Not on file  Social Connections: Not on file  Intimate Partner Violence: Not on file    No Known Allergies  Current Outpatient Medications  Medication Sig Dispense Refill   aspirin EC 81 MG tablet Take 1 tablet (81 mg total) by mouth daily. Swallow whole. 90 tablet 3   clindamycin (CLEOCIN) 300 MG capsule Take 300 mg by mouth. Prior to dental work     docusate sodium (COLACE) 100 MG capsule Take 2 capsules (200 mg total) by mouth daily. (Patient taking differently: Take 100 mg by mouth daily.) 10 capsule 0   glipiZIDE (GLUCOTROL) 5 MG tablet Take 1 tablet (5 mg  total) by mouth daily before breakfast. 30 tablet 0   levothyroxine (SYNTHROID) 50 MCG tablet Take 1 tablet (50 mcg total) by mouth daily before breakfast. 30 tablet 0   metolazone (ZAROXOLYN) 2.5 MG tablet Take 2.5 mg by mouth daily.     metoprolol succinate (TOPROL-XL) 25 MG 24 hr tablet TAKE ONE-HALF TABLET BY  MOUTH DAILY 45 tablet 3   Multiple Vitamin (MULTIVITAMIN WITH MINERALS) TABS tablet Take 1 tablet by mouth daily.     Multiple Vitamins-Minerals (PRESERVISION AREDS 2+MULTI VIT PO) Take by mouth.     pantoprazole (PROTONIX) 40 MG tablet Take 1 tablet (40 mg total) by mouth daily. 30 tablet 0   potassium chloride SA (KLOR-CON) 20 MEQ tablet Take 1 tablet (20 mEq total) by mouth daily. 90 tablet 3   pravastatin  (PRAVACHOL) 40 MG tablet TAKE 1 TABLET BY MOUTH IN  THE EVENING 90 tablet 3   torsemide (DEMADEX) 20 MG tablet Take 2 tablets (40 mg total) by mouth daily. (Patient taking differently: Take 20 mg by mouth daily. One tablet by mouth daily) 180 tablet 3   No current facility-administered medications for this visit.    REVIEW OF SYSTEMS:  [X]  denotes positive finding, [ ]  denotes negative finding Cardiac  Comments:  Chest pain or chest pressure:    Shortness of breath upon exertion: x   Short of breath when lying flat:    Irregular heart rhythm:        Vascular    Pain in calf, thigh, or hip brought on by ambulation: x   Pain in feet at night that wakes you up from your sleep:     Blood clot in your veins:    Leg swelling:         Pulmonary    Oxygen at home:    Productive cough:     Wheezing:         Neurologic    Sudden weakness in arms or legs:     Sudden numbness in arms or legs:     Sudden onset of difficulty speaking or slurred speech:    Temporary loss of vision in one eye:     Problems with dizziness:         Gastrointestinal    Blood in stool:     Vomited blood:         Genitourinary    Burning when urinating:     Blood in urine:        Psychiatric    Major depression:         Hematologic    Bleeding problems:    Problems with blood clotting too easily:        Skin    Rashes or ulcers:        Constitutional    Fever or chills:      PHYSICAL EXAM: Vitals:   12/13/20 1547 12/13/20 1553  BP: 137/72 121/63  Pulse: 70   Temp: 97.9 F (36.6 C)   TempSrc: Oral   SpO2: 97%   Weight: 192 lb (87.1 kg)   Height: 5' 10.5" (1.791 m)     GENERAL: The patient is a well-nourished male, in no acute distress. The vital signs are documented above. CARDIOVASCULAR: 2+ radial pulses bilaterally.  Carotid arteries without bruits bilaterally. PULMONARY: There is good air exchange  MUSCULOSKELETAL: There are no major deformities or cyanosis. NEUROLOGIC: No focal  weakness or paresthesias are detected. SKIN: There are no ulcers or rashes noted. PSYCHIATRIC:  The patient has a normal affect.  DATA:  Carotid duplex from 11/07/2020 at [facility] was reviewed.  This reveals occlusion of his left internal carotid artery.  He does have elevated velocities in his right internal carotid artery with 282 cm/s systolic and 76 cm/s end-diastolic.  This would correspond with high-end of 60 to 79% stenosis in our lab.  MEDICAL ISSUES: Asymptomatic carotid disease.  Known left carotid occlusion and moderate to severe right carotid stenosis.  I would agree with yearly duplex has been currently done through Dr. Myles Gip office.  We would recommend surgery with progression to critical stenosis.  I do not feel that this poses any significant increased risk regarding perioperative events.  He is tolerated contralateral occlusion for years and had no difficulty at the time of coronary bypass 1 year ago.  He was reassured with this discussion will see [place] again on an as-needed basis   Rosetta Posner, MD [facility] Vascular and Vein Specialists of [facility] [phone] Pager [phone]  Note: Portions of this report may have been transcribed using voice recognition software.  Every effort has been made to ensure accuracy; however, inadvertent computerized transcription errors may still be present.

## 2020-12-15 NOTE — Telephone Encounter (Signed)
    Patient Name: William Walker  DOB: 30-Oct-1926 MRN: [id number]  Primary Cardiologist: [name], MD  Chart reviewed as part of pre-operative protocol coverage. Per Dr. Domenic Polite, "Mr. Klippel has done relatively well since cardiac surgery last summer.  Having said that, his RCRI cardiac risk calculator places him in the high risk range, class IV with 11% chance of major adverse cardiac event.  This is not a modifiable risk.  I would suggest that he have his carotids and echocardiogram followed up on prior to proceeding with surgery.  If both cardiac structure and function and degree of carotid artery disease are stable, he could consider proceeding with high risk surgery without further cardiac testing.  Aspirin will need to be held 7 days prior to surgery."  Repeat echocardiogram 11/17/20 was stable with EF 55-60%, G3DD, mild MR, moderate TR, and mild AI s/p AVR. He was seen by Dr. Donnetta Hutching with vascular surgery for worsening carotid artery disease who stated " I do not feel that this poses any significant increased risk regarding perioperative events.  He is tolerated contralateral occlusion for years and had no difficulty at the time of coronary bypass 1 year ago."   At this time patient is at acceptable but high risk for hernia repair, though no further cardiac work-up is advised at this time.   I will route this recommendation to the requesting party via Epic fax function and remove from pre-op pool.  Please call with questions.  Abigail Butts, PA-C 12/15/2020, 11:15 AM

## 2020-12-27 NOTE — Progress Notes (Signed)
DUE TO COVID-19 ONLY ONE VISITOR IS ALLOWED TO COME WITH YOU AND STAY IN THE WAITING ROOM ONLY DURING PRE OP AND PROCEDURE DAY OF SURGERY. THE 1 VISITOR  MAY VISIT WITH YOU AFTER SURGERY IN YOUR PRIVATE ROOM DURING VISITING HOURS ONLY!  YOU NEED TO HAVE A COVID 19 TEST ON_______ '@_______'$ , THIS TEST MUST BE DONE BEFORE SURGERY,  COVID TESTING SITE [address] [address], IT IS ON THE RIGHT GOING OUT [address] APPROXIMATELY  2 MINUTES PAST [employer] ON THE RIGHT. ONCE YOUR COVID TEST IS COMPLETED,  PLEASE BEGIN THE QUARANTINE INSTRUCTIONS AS OUTLINED IN YOUR HANDOUT.                William Walker  12/27/2020   Your procedure is scheduled on:          01/09/2021   Report to [facility] Main  Entrance   Report to admitting at     [phone]     Call this number if you have problems the morning of surgery [phone]    REMEMBER: NO  SOLID FOOD CANDY OR GUM AFTER MIDNIGHT. CLEAR LIQUIDS UNTIL   0430am         . NOTHING BY MOUTH EXCEPT CLEAR LIQUIDS UNTIL  0430am     . PLEASE FINISH ENSURE DRINK PER SURGEON ORDER  WHICH NEEDS TO BE COMPLETED AT  0430am     .      CLEAR LIQUID DIET   Foods Allowed                                                                    Coffee and tea, regular and decaf                            Fruit ices (not with fruit pulp)                                      Iced Popsicles                                    Carbonated beverages, regular and diet                                    Cranberry, grape and apple juices Sports drinks like Gatorade Lightly seasoned clear broth or consume(fat free) Sugar, honey syrup ___________________________________________________________________      BRUSH YOUR TEETH MORNING OF SURGERY AND RINSE YOUR MOUTH OUT, NO CHEWING GUM CANDY OR MINTS.     Take these medicines the morning of surgery with A SIP OF WATER:  protonix, toprpo9l,, synthroid   DO NOT TAKE ANY DIABETIC MEDICATIONS  DAY OF YOUR SURGERY                               You may not have any metal on your body including hair pins and  piercings  Do not wear jewelry, make-up, lotions, powders or perfumes, deodorant             Do not wear nail polish on your fingernails.  Do not shave  48 hours prior to surgery.              Men may shave face and neck.   Do not bring valuables to the hospital. [place].  Contacts, dentures or bridgework may not be worn into surgery.  Leave suitcase in the car. After surgery it may be brought to your room.     Patients discharged the day of surgery will not be allowed to drive home. IF YOU ARE HAVING SURGERY AND GOING HOME THE SAME DAY, YOU MUST HAVE AN ADULT TO DRIVE YOU HOME AND BE WITH YOU FOR 24 HOURS. YOU MAY GO HOME BY TAXI OR UBER OR ORTHERWISE, BUT AN ADULT MUST ACCOMPANY YOU HOME AND STAY WITH YOU FOR 24 HOURS.  Name and phone number of your driver:  Special Instructions: N/A              Please read over the following fact sheets you were given: _____________________________________________________________________  [employer] - Preparing for Surgery Before surgery, you can play an important role.  Because skin is not sterile, your skin needs to be as free of germs as possible.  You can reduce the number of germs on your skin by washing with CHG (chlorahexidine gluconate) soap before surgery.  CHG is an antiseptic cleaner which kills germs and bonds with the skin to continue killing germs even after washing. Please DO NOT use if you have an allergy to CHG or antibacterial soaps.  If your skin becomes reddened/irritated stop using the CHG and inform your nurse when you arrive at Short Stay. Do not shave (including legs and underarms) for at least 48 hours prior to the first CHG shower.  You may shave your face/neck. Please follow these instructions carefully:  1.  Shower with CHG Soap the night before  surgery and the  morning of Surgery.  2.  If you choose to wash your hair, wash your hair first as usual with your  normal  shampoo.  3.  After you shampoo, rinse your hair and body thoroughly to remove the  shampoo.                           4.  Use CHG as you would any other liquid soap.  You can apply chg directly  to the skin and wash                       Gently with a scrungie or clean washcloth.  5.  Apply the CHG Soap to your body ONLY FROM THE NECK DOWN.   Do not use on face/ open                           Wound or open sores. Avoid contact with eyes, ears mouth and genitals (private parts).                       Wash face,  Genitals (private parts) with your normal soap.             6.  Wash thoroughly, paying special attention to the area where your surgery  will be performed.  7.  Thoroughly rinse your body with warm water from the neck down.  8.  DO NOT shower/wash with your normal soap after using and rinsing off  the CHG Soap.                9.  Pat yourself dry with a clean towel.            10.  Wear clean pajamas.            11.  Place clean sheets on your bed the night of your first shower and do not  sleep with pets. Day of Surgery : Do not apply any lotions/deodorants the morning of surgery.  Please wear clean clothes to the hospital/surgery center.  FAILURE TO FOLLOW THESE INSTRUCTIONS MAY RESULT IN THE CANCELLATION OF YOUR SURGERY PATIENT SIGNATURE_________________________________  NURSE SIGNATURE__________________________________  ________________________________________________________________________

## 2020-12-28 ENCOUNTER — Encounter (HOSPITAL_COMMUNITY)
Admission: RE | Admit: 2020-12-28 | Discharge: 2020-12-28 | Disposition: A | Discharge status: Home / Self Care | Payer: Medicare Other | Source: Ambulatory Visit | Attending: Surgery | Admitting: Surgery

## 2020-12-28 ENCOUNTER — Encounter (HOSPITAL_COMMUNITY): Payer: Self-pay | Admitting: Physician Assistant

## 2020-12-28 ENCOUNTER — Encounter (HOSPITAL_COMMUNITY): Payer: Self-pay | Admitting: Anesthesiology

## 2020-12-28 ENCOUNTER — Other Ambulatory Visit: Payer: Self-pay

## 2020-12-28 ENCOUNTER — Encounter (HOSPITAL_COMMUNITY): Payer: Self-pay

## 2020-12-28 DIAGNOSIS — I4430 Unspecified atrioventricular block: Secondary | ICD-10-CM | POA: Insufficient documentation

## 2020-12-28 DIAGNOSIS — I4892 Unspecified atrial flutter: Secondary | ICD-10-CM | POA: Diagnosis not present

## 2020-12-28 DIAGNOSIS — Z01818 Encounter for other preprocedural examination: Secondary | ICD-10-CM | POA: Insufficient documentation

## 2020-12-28 HISTORY — DX: Malignant (primary) neoplasm, unspecified: C80.1

## 2020-12-28 HISTORY — DX: Personal history of urinary calculi: Z87.442

## 2020-12-28 HISTORY — DX: Unspecified osteoarthritis, unspecified site: M19.90

## 2020-12-28 LAB — BASIC METABOLIC PANEL
Anion gap: 13 (ref 5–15)
BUN: 42 mg/dL — ABNORMAL HIGH (ref 8–23)
CO2: 30 mmol/L (ref 22–32)
Calcium: 9.7 mg/dL (ref 8.9–10.3)
Chloride: 96 mmol/L — ABNORMAL LOW (ref 98–111)
Creatinine, Ser: 1.6 mg/dL — ABNORMAL HIGH (ref 0.61–1.24)
GFR, Estimated: 40 mL/min — ABNORMAL LOW (ref >60)
Glucose, Bld: 127 mg/dL — ABNORMAL HIGH (ref 70–99)
Potassium: 3.4 mmol/L — ABNORMAL LOW (ref 3.5–5.1)
Sodium: 139 mmol/L (ref 135–145)

## 2020-12-28 LAB — CBC
HCT: 37.6 % — ABNORMAL LOW (ref 39.0–52.0)
Hemoglobin: 12.5 g/dL — ABNORMAL LOW (ref 13.0–17.0)
MCH: 34.4 pg — ABNORMAL HIGH (ref 26.0–34.0)
MCHC: 33.2 g/dL (ref 30.0–36.0)
MCV: 103.6 fL — ABNORMAL HIGH (ref 80.0–100.0)
Platelets: 166 10*3/uL (ref 150–400)
RBC: 3.63 MIL/uL — ABNORMAL LOW (ref 4.22–5.81)
RDW: 15.4 % (ref 11.5–15.5)
WBC: 6.3 10*3/uL (ref 4.0–10.5)
nRBC: 0 % (ref 0.0–0.2)

## 2020-12-28 LAB — GLUCOSE, CAPILLARY: Glucose-Capillary: 139 mg/dL — ABNORMAL HIGH (ref 70–99)

## 2020-12-28 NOTE — Progress Notes (Addendum)
Anesthesia Review:  PCP: [facility] in [place]  Requested LOV note. On 12/28/20.  Cardiologist : DR Johnny Bridge [place] 07/12/20 with Bernerd Pho, [facility]  Vascular surgery- Dr Early - LOV 12/13/20  Carotids- 11/07/20  Heart monitor- 04/2020  11/2019- PFT  Chest x-ray : EKG : 03/13/20 and 12/28/20  Echo : 11/17/20  Stress test: Cardiac Cath : 2021  Activity level:  does stairs slowly per pt uses cane  Sleep Study/ CPAP : none  Fasting Blood Sugar :      / Checks Blood Sugar -- times a day:   Blood Thinner/ Instructions /Last Dose: ASA / Instructions/ Last Dose :   Heart rate on dynamapp was from 69-117 ekg done at preop .  Showed atrial flutter.  Konrad Felix, PAC not in office at time of preop .  Pt voices no complaints.  PT discharged home with son.   Hgba1c-12/28/20 -7.4 Pt checks glucose once daily at home.  BMP done 12/28/20 routeid to Dr Harlow Asa.  Son present with pt at time of preop appt.  Made Roanna Banning aware on 12/28/20 at 1330pm of atrial flutter at preop appt.

## 2020-12-29 LAB — HEMOGLOBIN A1C
Hgb A1c MFr Bld: 7.4 % — ABNORMAL HIGH (ref 4.8–5.6)
Mean Plasma Glucose: 166 mg/dL

## 2020-12-29 NOTE — Progress Notes (Signed)
Anesthesia Chart Review   Case: [id number] Date/Time: 01/09/21 0715   Procedure: OPEN REPAIR RIGHT INGUINAL HERNIA WITH MESH (Right)   Anesthesia type: General   Pre-op diagnosis: INCARCERATED RIGHT INGUINAL HERNIA   Location: [facility] ROOM 03 / [facility]   Surgeons: Armandina Gemma, MD       DISCUSSION:87 y.o. former smoker with h/o GERD, HTN, DM II, CAD (stent, CABG 2021), s/p AVR 11/2019, incarcerated inguinal hernia scheduled for above procedure 01/09/2021 with Dr. Armandina Gemma.   Per cardiology preoperative evaluation 12/15/2020, "Chart reviewed as part of pre-operative protocol coverage. Per Dr. Domenic Polite, "Mr. Sanders has done relatively well since cardiac surgery last summer.  Having said that, his RCRI cardiac risk calculator places him in the high risk range, class IV with 11% chance of major adverse cardiac event.  This is not a modifiable risk.  I would suggest that he have his carotids and echocardiogram followed up on prior to proceeding with surgery.  If both cardiac structure and function and degree of carotid artery disease are stable, he could consider proceeding with high risk surgery without further cardiac testing.  Aspirin will need to be held 7 days prior to surgery."  Repeat echocardiogram 11/17/20 was stable with EF 55-60%, G3DD, mild MR, moderate TR, and mild AI s/p AVR. He was seen by Dr. Donnetta Hutching with vascular surgery for worsening carotid artery disease who stated " I do not feel that this poses any significant increased risk regarding perioperative events.  He is tolerated contralateral occlusion for years and had no difficulty at the time of coronary bypass 1 year ago."   At this time patient is at acceptable but high risk for hernia repair, though no further cardiac work-up is advised at this time."  New onset a flutter on EKG at PAT visit 12/28/2020, rate of 62bpm. Will need to be seen by cardiology prior to surgery.  Discussed with cardiology PA. Surgeon's office made aware.   VS: BP (!)  130/93   Pulse (!) 106   Temp 36.7 C (Oral)   Resp 16   Ht 6' (1.829 m)   Wt 84.8 kg   SpO2 99%   BMI 25.36 kg/m   PROVIDERS: Cory Munch, PA-C is PCP   [name], MD is Cardiologist  LABS: Labs reviewed: Acceptable for surgery. (all labs ordered are listed, but only abnormal results are displayed)  Labs Reviewed  BASIC METABOLIC PANEL - Abnormal; Notable for the following components:      Result Value   Potassium 3.4 (*)    Chloride 96 (*)    Glucose, Bld 127 (*)    BUN 42 (*)    Creatinine, Ser 1.60 (*)    GFR, Estimated 40 (*)    All other components within normal limits  CBC - Abnormal; Notable for the following components:   RBC 3.63 (*)    Hemoglobin 12.5 (*)    HCT 37.6 (*)    MCV 103.6 (*)    MCH 34.4 (*)    All other components within normal limits  HEMOGLOBIN A1C - Abnormal; Notable for the following components:   Hgb A1c MFr Bld 7.4 (*)    All other components within normal limits  GLUCOSE, CAPILLARY - Abnormal; Notable for the following components:   Glucose-Capillary 139 (*)    All other components within normal limits     IMAGES:   EKG: 12/28/2020 Rate 62 bpm  Atrial flutter with variable A-V block Nonspecific ST abnormality Abnormal ECG  CV: Echo 11/17/2020  1. Left ventricular ejection fraction, by estimation, is 55 to 60%. The  left ventricle has normal function. Left ventricular endocardial border  not optimally defined to evaluate regional wall motion. There is moderate  left ventricular hypertrophy. Left  ventricular diastolic parameters are consistent with Grade III diastolic  dysfunction (restrictive). Elevated left atrial pressure.   2. Grossly RV appears moderately enlarged with mildly decreased systolic  function. . Right ventricular systolic function was not well visualized.  The right ventricular size is not well visualized.   3. Left atrial size was severely dilated.   4. The mitral valve is normal in structure.  Mild mitral valve  regurgitation. No evidence of mitral stenosis.   5. Tricuspid valve regurgitation is moderate.   6. 23 mm Inspiris Resilia tissue valve is in the aortic valve position. .  The aortic valve has been repaired/replaced. Aortic valve regurgitation is  mild.   7. The inferior vena cava is dilated in size with >50% respiratory  variability, suggesting right atrial pressure of 8 mmHg. Past Medical History:  Diagnosis Date   Aortic stenosis    a. s/p AVR in 11/2019   Arthritis    Cancer [facility])    Coronary atherosclerosis of native coronary artery    a. s/p prior stenting of LAD b. cath in 11/2019 showing LM and 3-vessel CAD--> s/p CABG on 11/22/2019 with LIMA-LAD, SVG-dRCA and seq SVG-RI-OM1 and complicated by post-cardiotomy shock   CVD (cerebrovascular disease)    Essential hypertension    GERD (gastroesophageal reflux disease)    History of kidney stones    Hyperlipidemia    MI (myocardial infarction) [facility])    October 1999   Type 2 diabetes mellitus with diabetic neuropathy [facility])     Past Surgical History:  Procedure Laterality Date   AORTIC VALVE REPLACEMENT N/A 11/22/2019   Procedure: AORTIC VALVE REPLACEMENT (AVR) USING INSPIRIS 23MM;  Surgeon: [name], MD;  Location: [facility];  Service: Open Heart Surgery;  Laterality: N/A;   Arch cerebral ateriogram  12/11/2005   Arvilla Meres Early MD   CATARACT EXTRACTION, BILATERAL     CORONARY ARTERY BYPASS GRAFT N/A 11/22/2019   Procedure: CORONARY ARTERY BYPASS GRAFTING (CABG), ON PUMP, TIMES FOUR , USING LEFT INTERNAL MAMMARY ARTERY AND ENDOSCOPICALLY HARVESTED RIGHT GREATER SAPHENOUS VEIN;  Surgeon: [name], MD;  Location: [place];  Service: Open Heart Surgery;  Laterality: N/A;  SEQUENTIAL RAMUS INTERMEDIATE TO OM1 SAPHENOUS VEIN TO DISTAL PDA LIMA TO LAD   INGUINAL HERNIA REPAIR Left 12/09/2012   Procedure: HERNIA REPAIR INGUINAL with mesh;  Surgeon: [name], MD;  Location: [facility];  Service: General;   Laterality: Left;   INSERTION OF MESH Left 12/09/2012   Procedure: INSERTION OF MESH;  Surgeon: [name], MD;  Location: [facility];  Service: General;  Laterality: Left;   RIGHT/LEFT HEART CATH AND CORONARY ANGIOGRAPHY N/A 10/07/2017   Procedure: RIGHT/LEFT HEART CATH AND CORONARY ANGIOGRAPHY;  Surgeon: [name], MD;  Location: [place] CV LAB;  Service: Cardiovascular;  Laterality: N/A;   RIGHT/LEFT HEART CATH AND CORONARY ANGIOGRAPHY N/A 11/16/2019   Procedure: RIGHT/LEFT HEART CATH AND CORONARY ANGIOGRAPHY;  Surgeon: Lorretta Harp, MD;  Location: [place] CV LAB;  Service: Cardiovascular;  Laterality: N/A;   TEE WITHOUT CARDIOVERSION N/A 11/22/2019   Procedure: TRANSESOPHAGEAL ECHOCARDIOGRAM (TEE);  Surgeon: [name], MD;  Location: [place];  Service: Open Heart Surgery;  Laterality: N/A;    MEDICATIONS:  aspirin EC 81 MG tablet   clindamycin (CLEOCIN) 300 MG capsule   docusate sodium (COLACE) 100 MG capsule   glipiZIDE (GLUCOTROL) 5 MG tablet   levothyroxine (SYNTHROID) 50 MCG tablet   metolazone (ZAROXOLYN) 2.5 MG tablet   metoprolol succinate (TOPROL-XL) 25 MG 24 hr tablet   Multiple Vitamin (MULTIVITAMIN WITH MINERALS) TABS tablet   Multiple Vitamins-Minerals (PRESERVISION AREDS 2+MULTI VIT PO)   pantoprazole (PROTONIX) 40 MG tablet   potassium chloride SA (KLOR-CON) 20 MEQ tablet   pravastatin (PRAVACHOL) 40 MG tablet   torsemide (DEMADEX) 20 MG tablet   No current facility-administered medications for this encounter.   Konrad Felix, PA-C [facility] Pre-Surgical Testing [phone]

## 2021-01-03 ENCOUNTER — Other Ambulatory Visit: Payer: Self-pay | Admitting: Student

## 2021-01-05 ENCOUNTER — Encounter (HOSPITAL_COMMUNITY): Payer: Self-pay | Admitting: Surgery

## 2021-01-05 NOTE — H&P (Signed)
General Surgery [facility]  Nilda Simmer DOB: 09/06/1926 Married / Language: English / Race: White Male   History of Present Illness   The patient is a 87 year old male who presents with an inguinal hernia.  CHIEF COMPLAINT: right inguinal hernia  Patient is referred by Dr. Kerin Perna at [facility] for surgical evaluation and management of an enlarging right inguinal hernia.  Patient has a history of left inguinal hernia repair performed 7 years ago by Dr. Aviva Signs in [place].  About 3 years ago the patient developed evidence of a right inguinal hernia.  This is continued to enlarge in size and now extends into the right hemiscrotum.  Patient has had intermittent episodes of discomfort.  He has noted some problems with constipation and requires a laxative.  He has not had any other abdominal surgery.  He presents today accompanied by his daughter who is a Marine scientist for surgical evaluation for repair.  Patient has not had any difficulties since his left inguinal hernia repair.  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  The patient does have a history of coronary artery disease and underwent bypass surgery with valve replacement in June 2021.  He is followed by Dr. Domenic Polite from cardiology in [place].   Past Surgical History  Cataract Surgery   Bilateral. Coronary Artery Bypass Graft   Open Inguinal Hernia Surgery   Left. Valve Replacement    Diagnostic Studies History  Colonoscopy   never  Allergies  No Known Drug Allergies  Allergies Reconciled    Medication History  Metoprolol Succinate ER  ('25MG'$  Tablet ER 24HR, Oral) Active. Aspirin  ('81MG'$  Tablet DR, Oral) Active. Potassium  ('99MG'$  Tablet, Oral) Active. Multiple Vitamin  (Oral) Active. Calcium  ('75MG'$  Tablet, Oral) Active. Colace  ('50MG'$  Capsule, Oral) Active. Glipizide  (2.'5MG'$  Tablet ER, Oral) Active. Levothyroxine Sodium  (25MCG Capsule, Oral) Active. Pantoprazole Sodium  ('20MG'$  Tablet DR, Oral) Active. Vitamin B12   (100MCG Tablet, Oral) Active. Clindamycin HCl  ('75MG'$  Capsule, Oral) Active. Medications Reconciled   Social History  Caffeine use   Carbonated beverages, Coffee, Tea. No alcohol use   No drug use   Tobacco use   Former smoker.  Family History  Cerebrovascular Accident   Father. Diabetes Mellitus   Brother. Heart Disease   Brother, Father. Hypertension   Brother, Daughter, Father, Son. Malignant Neoplasm Of Pancreas   Mother. Melanoma   Mother, Sister. Prostate Cancer   Son.  Other Problems Chest pain   Congestive Heart Failure   Diabetes Mellitus   Gastroesophageal Reflux Disease   High blood pressure   Hypercholesterolemia   Inguinal Hernia   Kidney Stone   Myocardial infarction   Thyroid Disease   Transfusion history   Vascular Disease    Review of Systems  General Not Present- Appetite Loss, Chills, Fatigue, Fever, Night Sweats, Weight Gain and Weight Loss. Skin Not Present- Change in Wart/Mole, Dryness, Hives, Jaundice, New Lesions, Non-Healing Wounds, Rash and Ulcer. HEENT Present- Hearing Loss and Wears glasses/contact lenses. Not Present- Earache, Hoarseness, Nose Bleed, Oral Ulcers, Ringing in the Ears, Seasonal Allergies, Sinus Pain, Sore Throat, Visual Disturbances and Yellow Eyes. Respiratory Not Present- Bloody sputum, Chronic Cough, Difficulty Breathing, Snoring and Wheezing. Breast Not Present- Breast Mass, Breast Pain, Nipple Discharge and Skin Changes. Gastrointestinal Not Present- Abdominal Pain, Bloating, Bloody Stool, Change in Bowel Habits, Chronic diarrhea, Constipation, Difficulty Swallowing, Excessive gas, Gets full quickly at meals, Hemorrhoids, Indigestion, Nausea, Rectal Pain and Vomiting. Male Genitourinary Present- Urgency and  Urine Leakage. Not Present- Blood in Urine, Change in Urinary Stream, Frequency, Impotence, Nocturia and Painful Urination. Musculoskeletal Present- Muscle Pain and Muscle Weakness. Not Present- Back Pain, Joint Pain, Joint  Stiffness and Swelling of Extremities. Neurological Present- Trouble walking. Not Present- Decreased Memory, Fainting, Headaches, Numbness, Seizures, Tingling, Tremor and Weakness. Psychiatric Not Present- Anxiety, Bipolar, Change in Sleep Pattern, Depression, Fearful and Frequent crying. Endocrine Not Present- Cold Intolerance, Excessive Hunger, Hair Changes, Heat Intolerance, Hot flashes and New Diabetes. Hematology Not Present- Blood Thinners, Easy Bruising, Excessive bleeding, Gland problems, HIV and Persistent Infections.  Vitals  Weight: 190.13 lb   Height: 70 in  Body Surface Area: 2.04 m   Body Mass Index: 27.28 kg/m   Temp.: 97.5 F    Pulse: 74 (Regular)    P.OX: 94% (Room air) BP: 130/60(Sitting, Left Arm, Standard)  Physical Exam   GENERAL APPEARANCE Development: normal Nutritional status: normal Gross deformities: none  SKIN Rash, lesions, ulcers: none Induration, erythema: none Nodules: none palpable  EYES Conjunctiva and lids: normal Pupils: equal and reactive Iris: normal bilaterally  EARS, NOSE, MOUTH, THROAT External ears: no lesion or deformity External nose: no lesion or deformity Hearing: grossly normal Due to Covid-19 pandemic, patient is wearing a mask.  NECK Symmetric: yes Trachea: midline Thyroid: no palpable nodules in the thyroid bed  CHEST Respiratory effort: normal Retraction or accessory muscle use: no Breath sounds: normal bilaterally Rales, rhonchi, wheeze: none  CARDIOVASCULAR Auscultation: regular rhythm, normal rate Murmurs: Grade 2-3 systolic ejection murmur Pulses: radial pulse 2+ palpable Lower extremity edema: none  ABDOMEN Distension: none Masses: none palpable Tenderness: none Hepatosplenomegaly: not present Hernia: not present  GENITOURINARY Penis: no lesions Scrotum: no masses Well-healed left inguinal incision. On the right there is a very large bulge extending into the right hemiscrotum. In a recumbent  position is is partially reducible. Bowel sounds are notable within the hernia sac. There is no significant tenderness.  MUSCULOSKELETAL Station and gait: normal Digits and nails: no clubbing or cyanosis Muscle strength: grossly normal all extremities Range of motion: grossly normal all extremities Deformity: none  LYMPHATIC Cervical: none palpable Supraclavicular: none palpable  PSYCHIATRIC Oriented to person, place, and time: yes Mood and affect: normal for situation Judgment and insight: appropriate for situation    Assessment & Plan   INCARCERATED RIGHT INGUINAL HERNIA (K40.30)  Patient is referred by his primary care physician and his general surgeon for surgical evaluation and management of a very large right inguinal hernia which is becoming progressively symptomatic.  Patient is provided with written literature on hernia surgery to review at home.  He is accompanied today by his daughter who is a surgical nurse.  Patient has a very large right inguinal hernia which is at least partially reducible.  It is becoming more symptomatic.  He is having problems with constipation and requires a laxative.  I have recommended proceeding with operative repair in the near future.  We would plan to use prosthetic mesh to reinforce the repair.  This will be done under general anesthesia.  The patient has several family members in the medical field and is comfortable doing this as an outpatient procedure.  We will plan to obtain cardiac clearance from his cardiologist prior to scheduling surgery.  We discussed restrictions on his activities after the procedure.  The patient understands and wishes to proceed.  The risks and benefits of the procedure have been discussed at length with the patient.  The patient understands the proposed procedure, potential  alternative treatments, and the course of recovery to be expected.  All of the patient's questions have been answered at this time.  The patient  wishes to proceed with surgery.  Armandina Gemma, MD [facility] [place] [facility] Office: [phone]

## 2021-01-08 ENCOUNTER — Encounter (HOSPITAL_COMMUNITY): Payer: Self-pay | Admitting: Surgery

## 2021-01-09 ENCOUNTER — Ambulatory Visit (HOSPITAL_COMMUNITY): Admission: RE | Admit: 2021-01-09 | Payer: Medicare Other | Source: Ambulatory Visit | Admitting: Surgery

## 2021-01-09 ENCOUNTER — Encounter (HOSPITAL_COMMUNITY): Admission: RE | Payer: Self-pay | Source: Ambulatory Visit

## 2021-01-09 DIAGNOSIS — K4090 Unilateral inguinal hernia, without obstruction or gangrene, not specified as recurrent: Secondary | ICD-10-CM

## 2021-01-09 SURGERY — REPAIR, HERNIA, INGUINAL, ADULT
Anesthesia: General | Laterality: Right

## 2021-01-17 NOTE — Progress Notes (Signed)
Cardiology Office Note  Date: 01/18/2021   ID: [name], DOB 08-Aug-1926, MRN [id number]  PCP:  Cory Munch, PA-C  Cardiologist:  [name], MD Electrophysiologist:  None   Chief Complaint  Patient presents with   Preoperative cardiac evaluation    History of Present Illness: William Walker is a 87 y.o. male last seen in February by Ms. Strader PA-C.  He is referred back to the office for clarification of preoperative cardiac status, right inguinal herniorrhaphy recently canceled due to finding of asymptomatic, rate controlled atrial flutter by preoperative anesthesia work-up, a new diagnosis.  He had already been deemed high risk for surgery.  RCRI cardiac risk calculator places him in a high risk range, class IV with at least 11% chance of major adverse cardiac event.  This is not a modifiable risk.  He is here today with his daughter.  He reports no chest pain or sense of palpitations, NYHA class II dyspnea with typical activities, he does use a cane.  I personally reviewed his ECG from 12/28/2020 which shows rate controlled atrial flutter with 3:1 block and nonspecific ST changes.  Rhythm is similar today by follow-up tracing.  He has bilateral carotid artery disease, carotid Dopplers from June noted below.  He was evaluated by Dr. Donnetta Hutching in July who reviewed the study which shows known left ICA occlusion and moderate to severe RCA stenosis in the 60 to 79% range.  Observation recommended.  CHA2DS2-VASc score is 6.  We went over his medications, also discussed rationale for considering anticoagulation to reduce stroke risk, although this would need to be timed with any upcoming surgery.  Past Medical History:  Diagnosis Date   Aortic stenosis    a. s/p AVR in 11/2019   Arthritis    Cancer [facility])    Coronary atherosclerosis of native coronary artery    a. s/p prior stenting of LAD b. cath in 11/2019 showing LM and 3-vessel CAD--> s/p CABG on 11/22/2019 with LIMA-LAD,  SVG-dRCA and seq SVG-RI-OM1 and complicated by post-cardiotomy shock   CVD (cerebrovascular disease)    Essential hypertension    GERD (gastroesophageal reflux disease)    History of kidney stones    Hyperlipidemia    MI (myocardial infarction) [facility])    October 1999   Type 2 diabetes mellitus with diabetic neuropathy [facility])     Past Surgical History:  Procedure Laterality Date   AORTIC VALVE REPLACEMENT N/A 11/22/2019   Procedure: AORTIC VALVE REPLACEMENT (AVR) USING INSPIRIS 23MM;  Surgeon: [name], MD;  Location: [place];  Service: Open Heart Surgery;  Laterality: N/A;   Arch cerebral ateriogram  12/11/2005   Arvilla Meres Early MD   CATARACT EXTRACTION, BILATERAL     CORONARY ARTERY BYPASS GRAFT N/A 11/22/2019   Procedure: CORONARY ARTERY BYPASS GRAFTING (CABG), ON PUMP, TIMES FOUR , USING LEFT INTERNAL MAMMARY ARTERY AND ENDOSCOPICALLY HARVESTED RIGHT GREATER SAPHENOUS VEIN;  Surgeon: [name], MD;  Location: [place];  Service: Open Heart Surgery;  Laterality: N/A;  SEQUENTIAL RAMUS INTERMEDIATE TO OM1 SAPHENOUS VEIN TO DISTAL PDA LIMA TO LAD   INGUINAL HERNIA REPAIR Left 12/09/2012   Procedure: HERNIA REPAIR INGUINAL with mesh;  Surgeon: [name], MD;  Location: [facility];  Service: General;  Laterality: Left;   INSERTION OF MESH Left 12/09/2012   Procedure: INSERTION OF MESH;  Surgeon: [name], MD;  Location: [facility];  Service: General;  Laterality: Left;   RIGHT/LEFT HEART CATH  AND CORONARY ANGIOGRAPHY N/A 10/07/2017   Procedure: RIGHT/LEFT HEART CATH AND CORONARY ANGIOGRAPHY;  Surgeon: [name], MD;  Location: [place] CV [facility];  Service: Cardiovascular;  Laterality: N/A;   RIGHT/LEFT HEART CATH AND CORONARY ANGIOGRAPHY N/A 11/16/2019   Procedure: RIGHT/LEFT HEART CATH AND CORONARY ANGIOGRAPHY;  Surgeon: Lorretta Harp, MD;  Location: [place] [facility];  Service: Cardiovascular;  Laterality: N/A;   TEE WITHOUT CARDIOVERSION N/A 11/22/2019   Procedure:  TRANSESOPHAGEAL ECHOCARDIOGRAM (TEE);  Surgeon: [name], MD;  Location: [place];  Service: Open Heart Surgery;  Laterality: N/A;    Current Outpatient Medications  Medication Sig Dispense Refill   aspirin EC 81 MG tablet Take 1 tablet (81 mg total) by mouth daily. Swallow whole. 90 tablet 3   clindamycin (CLEOCIN) 300 MG capsule Take 300 mg by mouth. Prior to dental work     docusate sodium (COLACE) 100 MG capsule Take 2 capsules (200 mg total) by mouth daily. (Patient taking differently: Take 100 mg by mouth daily.) 10 capsule 0   glipiZIDE (GLUCOTROL) 5 MG tablet Take 1 tablet (5 mg total) by mouth daily before breakfast. 30 tablet 0   levothyroxine (SYNTHROID) 50 MCG tablet Take 1 tablet (50 mcg total) by mouth daily before breakfast. (Patient taking differently: Take 75 mcg by mouth daily before breakfast.) 30 tablet 0   metolazone (ZAROXOLYN) 2.5 MG tablet Take 2.5 mg by mouth daily.     metoprolol succinate (TOPROL-XL) 25 MG 24 hr tablet TAKE ONE-HALF TABLET BY  MOUTH DAILY 45 tablet 3   Multiple Vitamin (MULTIVITAMIN WITH MINERALS) TABS tablet Take 1 tablet by mouth daily.     Multiple Vitamins-Minerals (PRESERVISION AREDS 2+MULTI VIT PO) Take by mouth.     pantoprazole (PROTONIX) 40 MG tablet Take 1 tablet (40 mg total) by mouth daily. 30 tablet 0   potassium chloride SA (KLOR-CON) 20 MEQ tablet Take 1 tablet (20 mEq total) by mouth daily. 90 tablet 3   pravastatin (PRAVACHOL) 40 MG tablet TAKE 1 TABLET BY MOUTH IN  THE EVENING 90 tablet 3   torsemide (DEMADEX) 20 MG tablet Take 20 mg by mouth daily.     No current facility-administered medications for this visit.   Allergies:  Patient has no known allergies.   ROS: Hearing loss.  Physical Exam: VS:  BP (!) 142/64   Pulse 82   Ht '5\' 10"'$  (1.778 m)   Wt 192 lb (87.1 kg)   SpO2 96%   BMI 27.55 kg/m , BMI Body mass index is 27.55 kg/m.  Wt Readings from Last 3 Encounters:  01/18/21 192 lb (87.1 kg)  12/28/20 187 lb  (84.8 kg)  12/13/20 192 lb (87.1 kg)    General: Pleasant elderly male, appears comfortable at rest.  Using a cane. HEENT: Conjunctiva and lids normal, wearing a mask. Neck: Supple, no elevated JVP or carotid bruits, no thyromegaly. Lungs: Clear to auscultation, nonlabored breathing at rest. Cardiac: Regular rate and rhythm, no S3, 1/6 systolic murmur. Abdomen: Soft, bowel sounds present, large inguinal hernia evident.. Extremities: Trace ankle edema.  ECG:  An ECG dated 03/13/2020 was personally reviewed today and demonstrated:  Sinus rhythm with prolonged PR interval and nonspecific ST-T changes.  Recent Labwork: 12/28/2020: BUN 42; Creatinine, Ser 1.60; Hemoglobin 12.5; Platelets 166; Potassium 3.4; Sodium 139     Component Value Date/Time   CHOL 166 11/16/2019 0026   CHOL 179 05/05/2018 1001   TRIG 88 11/16/2019 0026   TRIG 134 05/05/2018 1001  HDL 47 11/16/2019 0026   HDL 54 05/05/2018 1001   CHOLHDL 3.5 11/16/2019 0026   VLDL 18 11/16/2019 0026   LDLCALC 101 (H) 11/16/2019 0026    Other Studies Reviewed Today:  Carotid Dopplers 11/07/2020: IMPRESSION: 1. Chronic occlusion of the left internal carotid artery and left vertebral artery. 2. Severe (70-99%) stenosis proximal right internal carotid artery secondary to bulky calcified atherosclerotic plaque. As previously noted, the degree of stenosis may be slightly exaggerated from velocity acceleration as a compensatory affect of the left internal carotid artery occlusion. If more precise assessment is clinically warranted, formal cerebral arteriogram may be considered.  Echocardiogram 11/17/2020:  1. Left ventricular ejection fraction, by estimation, is 55 to 60%. The  left ventricle has normal function. Left ventricular endocardial border  not optimally defined to evaluate regional wall motion. There is moderate  left ventricular hypertrophy. Left  ventricular diastolic parameters are consistent with Grade III diastolic   dysfunction (restrictive). Elevated left atrial pressure.   2. Grossly RV appears moderately enlarged with mildly decreased systolic  function. . Right ventricular systolic function was not well visualized.  The right ventricular size is not well visualized.   3. Left atrial size was severely dilated.   4. The mitral valve is normal in structure. Mild mitral valve  regurgitation. No evidence of mitral stenosis.   5. Tricuspid valve regurgitation is moderate.   6. 23 mm Inspiris Resilia tissue valve is in the aortic valve position. .  The aortic valve has been repaired/replaced. Aortic valve regurgitation is  mild.   7. The inferior vena cava is dilated in size with >50% respiratory  variability, suggesting right atrial pressure of 8 mmHg.   Assessment and Plan:  1.  Preoperative cardiac evaluation in a 87 year old male with multivessel CAD status post CABG with bioprosthetic AVR for treatment of critical aortic stenosis in June 2021, carotid artery disease being managed medically, hypertension, hyperlipidemia, type 2 diabetes mellitus, and newly documented persistent typical atrial flutter with controlled ventricular response and CHA2DS2-VASc score of 6.  He has already been deemed high risk for inguinal herniorrhaphy under general anesthesia, and the presence of rate controlled atrial flutter does not substantially change this assessment other than adding another feature to consider in terms of potential perioperative complications (death, MI, heart failure, and cardiac rhythm disturbances).  His heart rate is controlled likely due to underlying conduction system disease and he is also on low-dose Toprol-XL.  He had recent carotid Dopplers and echocardiogram, does not require further cardiac testing at this time.  His perioperative cardiac risk is not modifiable and I have explained this in great detail to the patient and his daughter.  He states that he is comfortable excepting this high risk and  consenting to surgery and will plan to follow through when things can be rescheduled.  2.  Persistent, typical atrial flutter with controlled ventricular response in the setting of underlying conduction system disease and also beta-blocker usage.  He is asymptomatic in terms of palpitations.  CHA2DS2-VASc score is 6.  We did discuss switching from aspirin to Eliquis 2.5 mg twice daily, but will time this more optimally when a surgical date is known.  He would need to be off Eliquis for 48 hours prior to surgery.  3.  History of PSVT.  4.  Chronic diastolic heart failure, volume status improved with Demadex.  5.  CKD stage IIIb.  Creatinine 1.60.  Medication Adjustments/Labs and Tests Ordered: Current medicines are reviewed at length with  the patient today.  Concerns regarding medicines are outlined above.   Tests Ordered: Orders Placed This Encounter  Procedures   EKG 12-Lead    Medication Changes: No orders of the defined types were placed in this encounter.   Disposition:  Follow up  3 months.  Signed, Satira Sark, MD, [facility] 01/18/2021 1:27 PM    [facility] at [facility] [address], [place], [place] [address] Phone: [phone]; Fax: [phone]

## 2021-01-18 ENCOUNTER — Encounter: Payer: Self-pay | Admitting: Cardiology

## 2021-01-18 ENCOUNTER — Ambulatory Visit: Payer: Medicare Other | Admitting: Cardiology

## 2021-01-18 ENCOUNTER — Other Ambulatory Visit: Payer: Self-pay

## 2021-01-18 VITALS — BP 142/64 | HR 82 | Ht 70.0 in | Wt 192.0 lb

## 2021-01-18 DIAGNOSIS — I483 Typical atrial flutter: Secondary | ICD-10-CM | POA: Diagnosis not present

## 2021-01-18 DIAGNOSIS — I25119 Atherosclerotic heart disease of native coronary artery with unspecified angina pectoris: Secondary | ICD-10-CM | POA: Diagnosis not present

## 2021-01-18 DIAGNOSIS — Z01810 Encounter for preprocedural cardiovascular examination: Secondary | ICD-10-CM | POA: Diagnosis not present

## 2021-01-18 DIAGNOSIS — Z953 Presence of xenogenic heart valve: Secondary | ICD-10-CM

## 2021-01-18 NOTE — Patient Instructions (Signed)
Medication Instructions:  Your physician recommends that you continue on your current medications as directed. Please refer to the Current Medication list given to you today.  *If you need a refill on your cardiac medications before your next appointment, please call your pharmacy*   Lab Work: None today  If you have labs (blood work) drawn today and your tests are completely normal, you will receive your results only by: MyChart Message (if you have MyChart) OR A paper copy in the mail If you have any lab test that is abnormal or we need to change your treatment, we will call you to review the results.   Testing/Procedures: None today    Follow-Up: At [facility], you and your health needs are our priority.  As part of our continuing mission to provide you with exceptional heart care, we have created designated Provider Care Teams.  These Care Teams include your primary Cardiologist (physician) and Advanced Practice Providers (APPs -  Physician Assistants and Nurse Practitioners) who all work together to provide you with the care you need, when you need it.  We recommend signing up for the patient portal called "MyChart".  Sign up information is provided on this After Visit Summary.  MyChart is used to connect with patients for Virtual Visits (Telemedicine).  Patients are able to view lab/test results, encounter notes, upcoming appointments, etc.  Non-urgent messages can be sent to your provider as well.   To learn more about what you can do with MyChart, go to [email or web address].    Your next appointment:   3 month(s)  The format for your next appointment:   In Person  Provider:   Samuel McDowell, MD   Other Instructions None   

## 2021-01-29 NOTE — Patient Instructions (Addendum)
DUE TO COVID-19 ONLY ONE VISITOR IS ALLOWED TO COME WITH YOU AND STAY IN THE WAITING ROOM ONLY DURING PRE OP AND PROCEDURE DAY OF SURGERY IF YOU ARE GOING HOME AFTER SURGERY. IF YOU ARE SPENDING THE NIGHT 2 PEOPLE MAY VISIT WITH YOU IN YOUR PRIVATE ROOM AFTER SURGERY UNTIL VISITING  HOURS ARE OVER AT 800 PM AND THE 2 VISITORS CANNOT SPEND THE NIGHT.                 William Walker     Your procedure is scheduled on: 02/15/21   Report to [facility] Main  Entrance   Report to admitting at   7:15 AM     Call this number if you have problems the morning of surgery [phone]    Remember: Do not eat food  :After Midnight the night before your surgery,     You may have clear liquids from midnight until 6:30 am    CLEAR LIQUID DIET   Foods Allowed                                                                     Foods Excluded  Coffee and tea, regular and decaf                             liquids that you cannot                NO MILK OR CREAMER Plain Jell-O any favor except red or purple                                           see through such as: Fruit ices (not with fruit pulp)                                     milk, soups, orange juice  Iced Popsicles                                    All solid food Carbonated beverages, regular and diet                                    Cranberry, grape and apple juices Sports drinks like Gatorade Lightly seasoned clear broth or consume(fat free) Sugar    BRUSH YOUR TEETH MORNING OF SURGERY AND RINSE YOUR MOUTH OUT, NO CHEWING GUM CANDY OR MINTS.     Take these medicines the morning of surgery with A SIP OF WATER: Metoprolol, Levothyroxine, Pantoprazole  DO NOT TAKE ANY DIABETIC MEDICATIONS DAY OF YOUR SURGERY                               You may not have any metal on your body including  piercings  Do not wear jewelry,  lotions, powders or deodorant                          Men may shave face and  neck.   Do not bring valuables to the hospital. [place].  Contacts, dentures or bridgework may not be worn into surgery.       Patients discharged the day of surgery will not be allowed to drive home.  IF YOU ARE HAVING SURGERY AND GOING HOME THE SAME DAY, YOU MUST HAVE AN ADULT TO DRIVE YOU HOME AND BE WITH YOU FOR 24 HOURS.  YOU MAY GO HOME BY TAXI OR UBER OR ORTHERWISE, BUT AN ADULT MUST ACCOMPANY YOU HOME AND STAY WITH YOU FOR 24 HOURS.  Name and phone number of your driver:  Special Instructions: N/A              Please read over the following fact sheets you were given: _____________________________________________________________________             [facility] - Preparing for Surgery Before surgery, you can play an important role.  Because skin is not sterile, your skin needs to be as free of germs as possible.  You can reduce the number of germs on your skin by washing with CHG (chlorahexidine gluconate) soap before surgery.  CHG is an antiseptic cleaner which kills germs and bonds with the skin to continue killing germs even after washing. Please DO NOT use if you have an allergy to CHG or antibacterial soaps.  If your skin becomes reddened/irritated stop using the CHG and inform your nurse when you arrive at Short Stay.  You may shave your face/neck. Please follow these instructions carefully:  1.  Shower with CHG Soap the night before surgery and the  morning of Surgery.  2.  If you choose to wash your hair, wash your hair first as usual with your  normal  shampoo.  3.  After you shampoo, rinse your hair and body thoroughly to remove the  shampoo.                            4.  Use CHG as you would any other liquid soap.  You can apply chg directly  to the skin and wash                       Gently with a scrungie or clean washcloth.  5.  Apply the CHG Soap to your body ONLY FROM THE NECK DOWN.   Do not use on face/ open                            Wound or open sores. Avoid contact with eyes, ears mouth and genitals (private parts).                       Wash face,  Genitals (private parts) with your normal soap.             6.  Wash thoroughly, paying special attention to the area where your surgery  will be performed.  7.  Thoroughly rinse your body with warm water from the neck down.  8.  DO NOT  shower/wash with your normal soap after using and rinsing off  the CHG Soap.                9.  Pat yourself dry with a clean towel.            10.  Wear clean pajamas.            11.  Place clean sheets on your bed the night of your first shower and do not  sleep with pets. Day of Surgery : Do not apply any lotions/deodorants the morning of surgery.  Please wear clean clothes to the hospital/surgery center.  FAILURE TO FOLLOW THESE INSTRUCTIONS MAY RESULT IN THE CANCELLATION OF YOUR SURGERY PATIENT SIGNATURE_________________________________  NURSE SIGNATURE__________________________________  ________________________________________________________________________

## 2021-01-30 ENCOUNTER — Other Ambulatory Visit: Payer: Self-pay

## 2021-01-30 ENCOUNTER — Encounter (HOSPITAL_COMMUNITY)
Admission: RE | Admit: 2021-01-30 | Discharge: 2021-01-30 | Disposition: A | Discharge status: Home / Self Care | Payer: Medicare Other | Source: Ambulatory Visit | Attending: Surgery | Admitting: Surgery

## 2021-01-30 ENCOUNTER — Encounter (HOSPITAL_COMMUNITY): Payer: Self-pay

## 2021-01-30 DIAGNOSIS — Z01812 Encounter for preprocedural laboratory examination: Secondary | ICD-10-CM | POA: Diagnosis not present

## 2021-01-30 LAB — BASIC METABOLIC PANEL
Anion gap: 11 (ref 5–15)
BUN: 48 mg/dL — ABNORMAL HIGH (ref 8–23)
CO2: 31 mmol/L (ref 22–32)
Calcium: 9.9 mg/dL (ref 8.9–10.3)
Chloride: 98 mmol/L (ref 98–111)
Creatinine, Ser: 1.74 mg/dL — ABNORMAL HIGH (ref 0.61–1.24)
GFR, Estimated: 36 mL/min — ABNORMAL LOW (ref >60)
Glucose, Bld: 103 mg/dL — ABNORMAL HIGH (ref 70–99)
Potassium: 3.4 mmol/L — ABNORMAL LOW (ref 3.5–5.1)
Sodium: 140 mmol/L (ref 135–145)

## 2021-01-30 LAB — CBC
HCT: 39.3 % (ref 39.0–52.0)
Hemoglobin: 12.9 g/dL — ABNORMAL LOW (ref 13.0–17.0)
MCH: 34.1 pg — ABNORMAL HIGH (ref 26.0–34.0)
MCHC: 32.8 g/dL (ref 30.0–36.0)
MCV: 104 fL — ABNORMAL HIGH (ref 80.0–100.0)
Platelets: 176 10*3/uL (ref 150–400)
RBC: 3.78 MIL/uL — ABNORMAL LOW (ref 4.22–5.81)
RDW: 14.9 % (ref 11.5–15.5)
WBC: 7.2 10*3/uL (ref 4.0–10.5)
nRBC: 0 % (ref 0.0–0.2)

## 2021-01-30 LAB — GLUCOSE, CAPILLARY: Glucose-Capillary: 115 mg/dL — ABNORMAL HIGH (ref 70–99)

## 2021-01-30 MED ORDER — APIXABAN 2.5 MG PO TABS: 2.5000 mg | ORAL_TABLET | Freq: Two times a day (BID) | ORAL | 0 refills | Status: DC

## 2021-01-30 NOTE — Telephone Encounter (Signed)
If surgical date September 15, would go ahead and switch from aspirin to Eliquis 2.5 mg twice daily, can get this filled locally.  He would need to hold Eliquis 48 hours prior to surgery.

## 2021-01-30 NOTE — Progress Notes (Signed)
COVID Test Completed:NA    PCP - Dr. Raphael Gibney [place] 08/21/20 Cardiologist - Dr. Domenic Polite [place] 01/18/21  Chest x-ray - 12/30/19-epic EKG - 01/18/21-epic Stress Test - no ECHO - 11/17/20-epic Cardiac Cath - 11/16/19, CABG x4 11/22/19-epic Valve replaced 2021 Pacemaker/ICD device last checked:NA  Sleep Study - no CPAP -   Fasting Blood Sugar - 144-178 Checks Blood Sugar _QD____ times a day. A1c was 7.4 on 12/28/20  Blood Thinner Instructions:Pt is taking ASA 325 daily he will be switching to Eliquis before his surgery.  Pt is to hold it 48 hours prior to DOS. Per Dr. Domenic Polite. Aspirin Instructions:Stop taking when Eliquis is filled. Last Dose:02/12/21  Anesthesia review: Yes  Patient denies shortness of breath, fever, cough and chest pain at PAT appointment Yes. Pt uses a cane around the house but stays active and works in his garden. He has no SOB with the activities that he does.   Patient verbalized understanding of instructions that were given to them at the PAT appointment. Patient was also instructed that they will need to review over the PAT instructions again at home before surgery. yes

## 2021-02-06 ENCOUNTER — Other Ambulatory Visit: Payer: Self-pay | Admitting: *Deleted

## 2021-02-06 MED ORDER — APIXABAN 2.5 MG PO TABS
2.5000 mg | ORAL_TABLET | Freq: Two times a day (BID) | ORAL | 1 refills | Status: DC
Start: 2021-02-06 — End: 2021-07-23

## 2021-02-06 NOTE — Progress Notes (Signed)
Prescription refill request for Eliquis received. Indication: Atrial flutter Last office visit: 01/18/21  Myles Gip MD Scr: 1.74 on  01/30/21 Age: 87 Weight: 87.1kg  Based on above findings Eliquis 2.'5mg'$  twice daily is the appropriate dose. Rx sent to [facility].

## 2021-02-07 NOTE — Progress Notes (Signed)
Anesthesia Chart Review   Case: [id number] Date/Time: 02/15/21 0915   Procedure: OPEN RIGHT INGUINAL HERNIA REPAIR WITH MESH (Right)   Anesthesia type: General   Pre-op diagnosis: INCARCERATED RIGHT INGUINAL HERNIA   Location: [facility] ROOM 01 / [facility]   Surgeons: Armandina Gemma, MD       DISCUSSION:87 y.o. former smoker with h/o GERD, HTN, DM II (A1C 7.8 on 12/28/2020), CAD (CABG 11/22/19), CKD Stage III, s/p AVR, incarcerated right inguinal hernia scheduled for above procedure 02/15/2021 with Dr. Armandina Gemma.   Surgery was rescheduled due to new onset atrial flutter at PAT visit.  See previous PAT note 12/28/2020.  Last seen by cardiology 01/18/2021. Per OV note, "Preoperative cardiac evaluation in a 87 year old male with multivessel CAD status post CABG with bioprosthetic AVR for treatment of critical aortic stenosis in June 2021, carotid artery disease being managed medically, hypertension, hyperlipidemia, type 2 diabetes mellitus, and newly documented persistent typical atrial flutter with controlled ventricular response and CHA2DS2-VASc score of 6.  He has already been deemed high risk for inguinal herniorrhaphy under general anesthesia, and the presence of rate controlled atrial flutter does not substantially change this assessment other than adding another feature to consider in terms of potential perioperative complications (death, MI, heart failure, and cardiac rhythm disturbances).  His heart rate is controlled likely due to underlying conduction system disease and he is also on low-dose Toprol-XL.  He had recent carotid Dopplers and echocardiogram, does not require further cardiac testing at this time.  His perioperative cardiac risk is not modifiable and I have explained this in great detail to the patient and his daughter.  He states that he is comfortable excepting this high risk and consenting to surgery and will plan to follow through when things can be rescheduled."  Eliquis started at this  visit. He was advised to hold 48 hours prior to surgery.   Anticipate pt can proceed with planned procedure barring acute status change and after evaluation DOS.  VS: BP (!) 142/56   Pulse 76   Temp 37.2 C (Oral)   Resp 20   Ht '5\' 10"'$  (1.778 m)   Wt 83.9 kg   SpO2 100%   BMI 26.54 kg/m   PROVIDERS: Cory Munch, PA-C is PCP   [name], MD is Cardiologist  LABS: Labs reviewed: Acceptable for surgery. (all labs ordered are listed, but only abnormal results are displayed)  Labs Reviewed  CBC - Abnormal; Notable for the following components:      Result Value   RBC 3.78 (*)    Hemoglobin 12.9 (*)    MCV 104.0 (*)    MCH 34.1 (*)    All other components within normal limits  BASIC METABOLIC PANEL - Abnormal; Notable for the following components:   Potassium 3.4 (*)    Glucose, Bld 103 (*)    BUN 48 (*)    Creatinine, Ser 1.74 (*)    GFR, Estimated 36 (*)    All other components within normal limits  GLUCOSE, CAPILLARY - Abnormal; Notable for the following components:   Glucose-Capillary 115 (*)    All other components within normal limits     IMAGES:   EKG: 01/18/2021 Rate 76 bpm  Atrial flutter with 3:1 block and IVCD  CV: Echo 11/17/2020  1. Left ventricular ejection fraction, by estimation, is 55 to 60%. The  left ventricle has normal function. Left ventricular endocardial border  not optimally defined to evaluate regional wall motion. There is moderate  left ventricular hypertrophy. Left  ventricular diastolic parameters are consistent with Grade III diastolic  dysfunction (restrictive). Elevated left atrial pressure.   2. Grossly RV appears moderately enlarged with mildly decreased systolic  function. . Right ventricular systolic function was not well visualized.  The right ventricular size is not well visualized.   3. Left atrial size was severely dilated.   4. The mitral valve is normal in structure. Mild mitral valve  regurgitation. No  evidence of mitral stenosis.   5. Tricuspid valve regurgitation is moderate.   6. 23 mm Inspiris Resilia tissue valve is in the aortic valve position. .  The aortic valve has been repaired/replaced. Aortic valve regurgitation is  mild.   7. The inferior vena cava is dilated in size with >50% respiratory  variability, suggesting right atrial pressure of 8 mmHg.  Past Medical History:  Diagnosis Date   Aortic stenosis    a. s/p AVR in 11/2019   Arthritis    Cancer ([place])    skin   Coronary atherosclerosis of native coronary artery    a. s/p prior stenting of LAD b. cath in 11/2019 showing LM and 3-vessel CAD--> s/p CABG on 11/22/2019 with LIMA-LAD, SVG-dRCA and seq SVG-RI-OM1 and complicated by post-cardiotomy shock   CVD (cerebrovascular disease)    Essential hypertension    GERD (gastroesophageal reflux disease)    History of kidney stones    Hyperlipidemia    MI (myocardial infarction) [facility])    October 1999   Type 2 diabetes mellitus with diabetic neuropathy [facility])     Past Surgical History:  Procedure Laterality Date   AORTIC VALVE REPLACEMENT N/A 11/22/2019   Procedure: AORTIC VALVE REPLACEMENT (AVR) USING INSPIRIS 23MM;  Surgeon: [name], MD;  Location: [facility];  Service: Open Heart Surgery;  Laterality: N/A;   Arch cerebral ateriogram  12/11/2005   Arvilla Meres Early MD   CATARACT EXTRACTION, BILATERAL     CORONARY ARTERY BYPASS GRAFT N/A 11/22/2019   Procedure: CORONARY ARTERY BYPASS GRAFTING (CABG), ON PUMP, TIMES FOUR , USING LEFT INTERNAL MAMMARY ARTERY AND ENDOSCOPICALLY HARVESTED RIGHT GREATER SAPHENOUS VEIN;  Surgeon: [name], MD;  Location: [place];  Service: Open Heart Surgery;  Laterality: N/A;  SEQUENTIAL RAMUS INTERMEDIATE TO OM1 SAPHENOUS VEIN TO DISTAL PDA LIMA TO LAD   INGUINAL HERNIA REPAIR Left 12/09/2012   Procedure: HERNIA REPAIR INGUINAL with mesh;  Surgeon: [name], MD;  Location: [facility];  Service: General;  Laterality: Left;   INSERTION OF  MESH Left 12/09/2012   Procedure: INSERTION OF MESH;  Surgeon: [name], MD;  Location: [facility];  Service: General;  Laterality: Left;   RIGHT/LEFT HEART CATH AND CORONARY ANGIOGRAPHY N/A 10/07/2017   Procedure: RIGHT/LEFT HEART CATH AND CORONARY ANGIOGRAPHY;  Surgeon: [name], MD;  Location: [place] CV LAB;  Service: Cardiovascular;  Laterality: N/A;   RIGHT/LEFT HEART CATH AND CORONARY ANGIOGRAPHY N/A 11/16/2019   Procedure: RIGHT/LEFT HEART CATH AND CORONARY ANGIOGRAPHY;  Surgeon: Lorretta Harp, MD;  Location: [place] CV LAB;  Service: Cardiovascular;  Laterality: N/A;   TEE WITHOUT CARDIOVERSION N/A 11/22/2019   Procedure: TRANSESOPHAGEAL ECHOCARDIOGRAM (TEE);  Surgeon: [name], MD;  Location: [place];  Service: Open Heart Surgery;  Laterality: N/A;    MEDICATIONS:  apixaban (ELIQUIS) 2.5 MG TABS tablet   clindamycin (CLEOCIN) 300 MG capsule   docusate sodium (COLACE) 100 MG capsule   glipiZIDE (GLUCOTROL) 5 MG tablet   levothyroxine (SYNTHROID) 75 MCG  tablet   metolazone (ZAROXOLYN) 2.5 MG tablet   metoprolol succinate (TOPROL-XL) 25 MG 24 hr tablet   Multiple Vitamin (MULTIVITAMIN WITH MINERALS) TABS tablet   Multiple Vitamins-Minerals (PRESERVISION AREDS 2+MULTI VIT PO)   pantoprazole (PROTONIX) 40 MG tablet   Polyethyl Glycol-Propyl Glycol (SYSTANE) 0.4-0.3 % SOLN   potassium chloride SA (KLOR-CON) 20 MEQ tablet   pravastatin (PRAVACHOL) 40 MG tablet   torsemide (DEMADEX) 20 MG tablet   No current facility-administered medications for this encounter.     Konrad Felix Ward, PA-C [facility]Surgical Testing [phone]

## 2021-02-07 NOTE — Anesthesia Preprocedure Evaluation (Addendum)
Anesthesia Evaluation  Patient identified by MRN, date of birth, ID band Patient awake    Reviewed: Allergy & Precautions, NPO status , Patient's Chart, lab work & pertinent test results  Airway Mallampati: II  TM Distance: >3 FB Neck ROM: Full    Dental  (+) Poor Dentition, Chipped, Missing   Pulmonary neg pulmonary ROS, former smoker,    Pulmonary exam normal        Cardiovascular hypertension, Pt. on medications and Pt. on home beta blockers + Past MI, + Cardiac Stents and + CABG (06/21)   Rhythm:Regular Rate:Normal + Systolic Click    Neuro/Psych CVA negative psych ROS   GI/Hepatic Neg liver ROS, GERD  Medicated,Incarcerated inguinal hernia    Endo/Other  diabetes, Type 2, Oral Hypoglycemic Agents  Renal/GU negative Renal ROS  negative genitourinary   Musculoskeletal  (+) Arthritis , Osteoarthritis,    Abdominal (+)  Abdomen: soft.  Large right sided inguinal hernia   Peds  Hematology  (+) anemia ,   Anesthesia Other Findings   Reproductive/Obstetrics                           Anesthesia Physical Anesthesia Plan  ASA: 3  Anesthesia Plan: General   Post-op Pain Management:    Induction: Intravenous  PONV Risk Score and Plan: 2 and Ondansetron, Dexamethasone and Treatment may vary due to age or medical condition  Airway Management Planned: Mask and Oral ETT  Additional Equipment: None  Intra-op Plan:   Post-operative Plan: Extubation in OR  Informed Consent: I have reviewed the patients History and Physical, chart, labs and discussed the procedure including the risks, benefits and alternatives for the proposed anesthesia with the patient or authorized representative who has indicated his/her understanding and acceptance.     Dental advisory given  Plan Discussed with:   Anesthesia Plan Comments: (See PAT note 01/30/2021, Konrad Felix Ward, PA-C  Last seen by  cardiology 01/18/2021. Per OV note, "Preoperative cardiac evaluation in a 87 year old male with multivessel CAD status post CABG with bioprosthetic AVR for treatment of critical aortic stenosis in June 2021, carotid artery disease being managed medically, hypertension, hyperlipidemia, type 2 diabetes mellitus, and newly documented persistent typical atrial flutter with controlled ventricular response and CHA2DS2-VASc score of 6.  He has already been deemed high risk for inguinal herniorrhaphy under general anesthesia, and the presence of rate controlled atrial flutter does not substantially change this assessment other than adding another feature to consider in terms of potential perioperative complications (death, MI, heart failure, and cardiac rhythm disturbances).  His heart rate is controlled likely due to underlying conduction system disease and he is also on low-dose Toprol-XL.  He had recent carotid Dopplers and echocardiogram, does not require further cardiac testing at this time.  His perioperative cardiac risk is not modifiable and I have explained this in great detail to the patient and his daughter.  He states that he is comfortable excepting this high risk and consenting to surgery and will plan to follow through when things can be rescheduled."  Echo 11/17/2020 1. Left ventricular ejection fraction, by estimation, is 55 to 60%. The  left ventricle has normal function. Left ventricular endocardial border  not optimally defined to evaluate regional wall motion. There is moderate  left ventricular hypertrophy. Left  ventricular diastolic parameters are consistent with Grade III diastolic  dysfunction (restrictive). Elevated left atrial pressure.  2. Grossly RV appears moderately enlarged with mildly decreased systolic  function. . Right ventricular systolic function was not well visualized.  The right ventricular size is not well visualized.  3. Left atrial size was severely dilated.  4.  The mitral valve is normal in structure. Mild mitral valve  regurgitation. No evidence of mitral stenosis.  5. Tricuspid valve regurgitation is moderate.  6. 23 mm Inspiris Resilia tissue valve is in the aortic valve position. .  The aortic valve has been repaired/replaced. Aortic valve regurgitation is  mild.  7. The inferior vena cava is dilated in size with >50% respiratory  variability, suggesting right atrial pressure of 8 mmHg.  Lab Results      Component                Value               Date                      WBC                      7.2                 01/30/2021                HGB                      12.9 (L)            01/30/2021                HCT                      39.3                01/30/2021                MCV                      104.0 (H)           01/30/2021                PLT                      176                 01/30/2021           Lab Results      Component                Value               Date                      NA                       140                 01/30/2021                K                        3.4 (L)             01/30/2021                CO2  31                  01/30/2021                GLUCOSE                  103 (H)             01/30/2021                BUN                      48 (H)              01/30/2021                CREATININE               1.74 (H)            01/30/2021                CALCIUM                  9.9                 01/30/2021                GFRNONAA                 36 (L)              01/30/2021                GFRAA                    47 (L)              02/01/2020          )      Anesthesia Quick Evaluation

## 2021-02-10 ENCOUNTER — Encounter (HOSPITAL_COMMUNITY): Payer: Self-pay | Admitting: Surgery

## 2021-02-10 NOTE — H&P (Signed)
William Walker DOB: 1927/02/14 Married / Language: English / Race: White Male     History of Present Illness    The patient is a 87 year old male who presents with an inguinal hernia.   CHIEF COMPLAINT: right inguinal hernia   Patient is referred by Dr. Kerin Perna at [facility] for surgical evaluation and management of an enlarging right inguinal hernia.  Patient has a history of left inguinal hernia repair performed 7 years ago by Dr. Aviva Signs in [place].  About 3 years ago the patient developed evidence of a right inguinal hernia.  This is continued to enlarge in size and now extends into the right hemiscrotum.  Patient has had intermittent episodes of discomfort.  He has noted some problems with constipation and requires a laxative.  He has not had any other abdominal surgery.  He presents today accompanied by his daughter who is a Marine scientist for surgical evaluation for repair.  Patient has not had any difficulties since his left inguinal hernia repair.  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  The patient does have a history of coronary artery disease and underwent bypass surgery with valve replacement in June 2021.  He is followed by Dr. Domenic Polite from cardiology in [place].     Past Surgical History  Cataract Surgery   Bilateral. Coronary Artery Bypass Graft   Open Inguinal Hernia Surgery   Left. Valve Replacement     Diagnostic Studies History  Colonoscopy   never   Allergies  No Known Drug Allergies  Allergies Reconciled     Medication History  Metoprolol Succinate ER  ('25MG'$  Tablet ER 24HR, Oral) Active. Aspirin  ('81MG'$  Tablet DR, Oral) Active. Potassium  ('99MG'$  Tablet, Oral) Active. Multiple Vitamin  (Oral) Active. Calcium  ('75MG'$  Tablet, Oral) Active. Colace  ('50MG'$  Capsule, Oral) Active. Glipizide  (2.'5MG'$  Tablet ER, Oral) Active. Levothyroxine Sodium  (25MCG Capsule, Oral) Active. Pantoprazole Sodium  ('20MG'$  Tablet DR, Oral) Active. Vitamin B12  (100MCG Tablet, Oral)  Active. Clindamycin HCl  ('75MG'$  Capsule, Oral) Active. Medications Reconciled    Social History  Caffeine use   Carbonated beverages, Coffee, Tea. No alcohol use   No drug use   Tobacco use   Former smoker.   Family History  Cerebrovascular Accident   Father. Diabetes Mellitus   Brother. Heart Disease   Brother, Father. Hypertension   Brother, Daughter, Father, Son. Malignant Neoplasm Of Pancreas   Mother. Melanoma   Mother, Sister. Prostate Cancer   Son.   Other Problems Chest pain   Congestive Heart Failure   Diabetes Mellitus   Gastroesophageal Reflux Disease   High blood pressure   Hypercholesterolemia   Inguinal Hernia   Kidney Stone   Myocardial infarction   Thyroid Disease   Transfusion history   Vascular Disease     Review of Systems  General Not Present- Appetite Loss, Chills, Fatigue, Fever, Night Sweats, Weight Gain and Weight Loss. Skin Not Present- Change in Wart/Mole, Dryness, Hives, Jaundice, New Lesions, Non-Healing Wounds, Rash and Ulcer. HEENT Present- Hearing Loss and Wears glasses/contact lenses. Not Present- Earache, Hoarseness, Nose Bleed, Oral Ulcers, Ringing in the Ears, Seasonal Allergies, Sinus Pain, Sore Throat, Visual Disturbances and Yellow Eyes. Respiratory Not Present- Bloody sputum, Chronic Cough, Difficulty Breathing, Snoring and Wheezing. Breast Not Present- Breast Mass, Breast Pain, Nipple Discharge and Skin Changes. Gastrointestinal Not Present- Abdominal Pain, Bloating, Bloody Stool, Change in Bowel Habits, Chronic diarrhea, Constipation, Difficulty Swallowing, Excessive gas, Gets full quickly at meals, Hemorrhoids, Indigestion, Nausea,  Rectal Pain and Vomiting. Male Genitourinary Present- Urgency and Urine Leakage. Not Present- Blood in Urine, Change in Urinary Stream, Frequency, Impotence, Nocturia and Painful Urination. Musculoskeletal Present- Muscle Pain and Muscle Weakness. Not Present- Back Pain, Joint Pain, Joint Stiffness and  Swelling of Extremities. Neurological Present- Trouble walking. Not Present- Decreased Memory, Fainting, Headaches, Numbness, Seizures, Tingling, Tremor and Weakness. Psychiatric Not Present- Anxiety, Bipolar, Change in Sleep Pattern, Depression, Fearful and Frequent crying. Endocrine Not Present- Cold Intolerance, Excessive Hunger, Hair Changes, Heat Intolerance, Hot flashes and New Diabetes. Hematology Not Present- Blood Thinners, Easy Bruising, Excessive bleeding, Gland problems, HIV and Persistent Infections.   Vitals  Weight: 190.13 lb   Height: 70 in  Body Surface Area: 2.04 m   Body Mass Index: 27.28 kg/m   Temp.: 97.5 F    Pulse: 74 (Regular)    P.OX: 94% (Room air) BP: 130/60(Sitting, Left Arm, Standard)   Physical Exam    GENERAL APPEARANCE Development: normal Nutritional status: normal Gross deformities: none   SKIN Rash, lesions, ulcers: none Induration, erythema: none Nodules: none palpable   EYES Conjunctiva and lids: normal Pupils: equal and reactive Iris: normal bilaterally   EARS, NOSE, MOUTH, THROAT External ears: no lesion or deformity External nose: no lesion or deformity Hearing: grossly normal Due to Covid-19 pandemic, patient is wearing a mask.   NECK Symmetric: yes Trachea: midline Thyroid: no palpable nodules in the thyroid bed   CHEST Respiratory effort: normal Retraction or accessory muscle use: no Breath sounds: normal bilaterally Rales, rhonchi, wheeze: none   CARDIOVASCULAR Auscultation: regular rhythm, normal rate Murmurs: Grade 2-3 systolic ejection murmur Pulses: radial pulse 2+ palpable Lower extremity edema: none   ABDOMEN Distension: none Masses: none palpable Tenderness: none Hepatosplenomegaly: not present Hernia: not present   GENITOURINARY Penis: no lesions Scrotum: no masses Well-healed left inguinal incision. On the right there is a very large bulge extending into the right hemiscrotum. In a recumbent position  is is partially reducible. Bowel sounds are notable within the hernia sac. There is no significant tenderness.   MUSCULOSKELETAL Station and gait: normal Digits and nails: no clubbing or cyanosis Muscle strength: grossly normal all extremities Range of motion: grossly normal all extremities Deformity: none   LYMPHATIC Cervical: none palpable Supraclavicular: none palpable   PSYCHIATRIC Oriented to person, place, and time: yes Mood and affect: normal for situation Judgment and insight: appropriate for situation       Assessment & Plan    INCARCERATED RIGHT INGUINAL HERNIA (K40.30)   Patient is referred by his primary care physician and his general surgeon for surgical evaluation and management of a very large right inguinal hernia which is becoming progressively symptomatic.  Patient is provided with written literature on hernia surgery to review at home.  He is accompanied today by his daughter who is a surgical nurse.   Patient has a very large right inguinal hernia which is at least partially reducible.  It is becoming more symptomatic.  He is having problems with constipation and requires a laxative.  I have recommended proceeding with operative repair in the near future.  We would plan to use prosthetic mesh to reinforce the repair.  This will be done under general anesthesia.  The patient has several family members in the medical field and is comfortable doing this as an outpatient procedure.  We will plan to obtain cardiac clearance from his cardiologist prior to scheduling surgery.  We discussed restrictions on his activities after the procedure.  The patient  understands and wishes to proceed.   The risks and benefits of the procedure have been discussed at length with the patient.  The patient understands the proposed procedure, potential alternative treatments, and the course of recovery to be expected.  All of the patient's questions have been answered at this time.  The patient  wishes to proceed with surgery.  Armandina Gemma, MD [facility] [place] practice Office: [phone]

## 2021-02-14 ENCOUNTER — Encounter (HOSPITAL_COMMUNITY): Payer: Self-pay | Admitting: Surgery

## 2021-02-15 ENCOUNTER — Ambulatory Visit (HOSPITAL_COMMUNITY)
Admission: RE | Admit: 2021-02-15 | Discharge: 2021-02-15 | Disposition: A | Discharge status: Home / Self Care | Payer: Medicare Other | Source: Ambulatory Visit | Attending: Surgery | Admitting: Surgery

## 2021-02-15 ENCOUNTER — Ambulatory Visit (HOSPITAL_COMMUNITY): Payer: Medicare Other | Admitting: Anesthesiology

## 2021-02-15 ENCOUNTER — Encounter (HOSPITAL_COMMUNITY)
Admission: RE | Disposition: A | Discharge status: Home / Self Care | Payer: Self-pay | Source: Ambulatory Visit | Attending: Surgery

## 2021-02-15 ENCOUNTER — Ambulatory Visit (HOSPITAL_COMMUNITY): Payer: Medicare Other | Admitting: Physician Assistant

## 2021-02-15 ENCOUNTER — Encounter (HOSPITAL_COMMUNITY): Payer: Self-pay | Admitting: Surgery

## 2021-02-15 DIAGNOSIS — E039 Hypothyroidism, unspecified: Secondary | ICD-10-CM | POA: Diagnosis not present

## 2021-02-15 DIAGNOSIS — E1151 Type 2 diabetes mellitus with diabetic peripheral angiopathy without gangrene: Secondary | ICD-10-CM | POA: Diagnosis not present

## 2021-02-15 DIAGNOSIS — Z8249 Family history of ischemic heart disease and other diseases of the circulatory system: Secondary | ICD-10-CM | POA: Diagnosis not present

## 2021-02-15 DIAGNOSIS — Z833 Family history of diabetes mellitus: Secondary | ICD-10-CM | POA: Diagnosis not present

## 2021-02-15 DIAGNOSIS — Z8679 Personal history of other diseases of the circulatory system: Secondary | ICD-10-CM | POA: Diagnosis not present

## 2021-02-15 DIAGNOSIS — Z952 Presence of prosthetic heart valve: Secondary | ICD-10-CM | POA: Insufficient documentation

## 2021-02-15 DIAGNOSIS — Z87891 Personal history of nicotine dependence: Secondary | ICD-10-CM | POA: Insufficient documentation

## 2021-02-15 DIAGNOSIS — I10 Essential (primary) hypertension: Secondary | ICD-10-CM | POA: Diagnosis not present

## 2021-02-15 DIAGNOSIS — E7800 Pure hypercholesterolemia, unspecified: Secondary | ICD-10-CM | POA: Diagnosis not present

## 2021-02-15 DIAGNOSIS — E876 Hypokalemia: Secondary | ICD-10-CM | POA: Diagnosis not present

## 2021-02-15 DIAGNOSIS — Z951 Presence of aortocoronary bypass graft: Secondary | ICD-10-CM | POA: Diagnosis not present

## 2021-02-15 DIAGNOSIS — I509 Heart failure, unspecified: Secondary | ICD-10-CM | POA: Insufficient documentation

## 2021-02-15 DIAGNOSIS — K4030 Unilateral inguinal hernia, with obstruction, without gangrene, not specified as recurrent: Secondary | ICD-10-CM | POA: Diagnosis not present

## 2021-02-15 DIAGNOSIS — I252 Old myocardial infarction: Secondary | ICD-10-CM | POA: Insufficient documentation

## 2021-02-15 DIAGNOSIS — K4090 Unilateral inguinal hernia, without obstruction or gangrene, not specified as recurrent: Secondary | ICD-10-CM

## 2021-02-15 DIAGNOSIS — I2510 Atherosclerotic heart disease of native coronary artery without angina pectoris: Secondary | ICD-10-CM | POA: Diagnosis not present

## 2021-02-15 HISTORY — PX: INGUINAL HERNIA REPAIR: SHX194

## 2021-02-15 LAB — GLUCOSE, CAPILLARY
Glucose-Capillary: 164 mg/dL — ABNORMAL HIGH (ref 70–99)
Glucose-Capillary: 193 mg/dL — ABNORMAL HIGH (ref 70–99)

## 2021-02-15 SURGERY — REPAIR, HERNIA, INGUINAL, ADULT
Anesthesia: General | Site: Inguinal | Laterality: Right

## 2021-02-15 MED ORDER — ORAL CARE MOUTH RINSE: 15.0000 mL | Freq: Once | OROMUCOSAL | Status: AC

## 2021-02-15 MED ORDER — PROPOFOL 10 MG/ML IV BOLUS
INTRAVENOUS | Status: AC
  Filled 2021-02-15: qty 40

## 2021-02-15 MED ORDER — DEXAMETHASONE SODIUM PHOSPHATE 4 MG/ML IJ SOLN
INTRAMUSCULAR | Status: DC | PRN
  Administered 2021-02-15: 4 mg via INTRAVENOUS

## 2021-02-15 MED ORDER — DEXMEDETOMIDINE (PRECEDEX) IN NS 20 MCG/5ML (4 MCG/ML) IV SYRINGE
PREFILLED_SYRINGE | INTRAVENOUS | Status: DC | PRN
  Administered 2021-02-15 (×2): 4 ug via INTRAVENOUS

## 2021-02-15 MED ORDER — BUPIVACAINE LIPOSOME 1.3 % IJ SUSP
INTRAMUSCULAR | Status: AC
  Filled 2021-02-15: qty 20

## 2021-02-15 MED ORDER — CHLORHEXIDINE GLUCONATE 0.12 % MT SOLN
15.0000 mL | Freq: Once | OROMUCOSAL | Status: AC
  Administered 2021-02-15: 15 mL via OROMUCOSAL

## 2021-02-15 MED ORDER — PHENYLEPHRINE HCL-NACL 20-0.9 MG/250ML-% IV SOLN
INTRAVENOUS | Status: AC
  Filled 2021-02-15: qty 500

## 2021-02-15 MED ORDER — EPHEDRINE 5 MG/ML INJ
INTRAVENOUS | Status: AC
  Filled 2021-02-15: qty 5

## 2021-02-15 MED ORDER — ONDANSETRON HCL 4 MG/2ML IJ SOLN
INTRAMUSCULAR | Status: AC
  Filled 2021-02-15: qty 4

## 2021-02-15 MED ORDER — FENTANYL CITRATE PF 50 MCG/ML IJ SOSY
PREFILLED_SYRINGE | INTRAMUSCULAR | Status: AC
  Filled 2021-02-15: qty 1

## 2021-02-15 MED ORDER — BUPIVACAINE HCL (PF) 0.25 % IJ SOLN
INTRAMUSCULAR | Status: DC | PRN
  Administered 2021-02-15: 20 mL

## 2021-02-15 MED ORDER — FENTANYL CITRATE (PF) 100 MCG/2ML IJ SOLN
INTRAMUSCULAR | Status: DC | PRN
  Administered 2021-02-15 (×2): 50 ug via INTRAVENOUS
  Administered 2021-02-15: 25 ug via INTRAVENOUS
  Administered 2021-02-15: 50 ug via INTRAVENOUS

## 2021-02-15 MED ORDER — SUGAMMADEX SODIUM 200 MG/2ML IV SOLN
INTRAVENOUS | Status: DC | PRN
  Administered 2021-02-15: 200 mg via INTRAVENOUS

## 2021-02-15 MED ORDER — DEXAMETHASONE SODIUM PHOSPHATE 10 MG/ML IJ SOLN
INTRAMUSCULAR | Status: AC
  Filled 2021-02-15: qty 2

## 2021-02-15 MED ORDER — FENTANYL CITRATE PF 50 MCG/ML IJ SOSY: 25.0000 ug | PREFILLED_SYRINGE | INTRAMUSCULAR | Status: DC | PRN

## 2021-02-15 MED ORDER — ROCURONIUM BROMIDE 10 MG/ML (PF) SYRINGE
PREFILLED_SYRINGE | INTRAVENOUS | Status: AC
  Filled 2021-02-15: qty 20

## 2021-02-15 MED ORDER — BUPIVACAINE-EPINEPHRINE (PF) 0.25% -1:200000 IJ SOLN
INTRAMUSCULAR | Status: AC
  Filled 2021-02-15: qty 30

## 2021-02-15 MED ORDER — EPHEDRINE SULFATE 50 MG/ML IJ SOLN
INTRAMUSCULAR | Status: DC | PRN
  Administered 2021-02-15: 5 mg via INTRAVENOUS

## 2021-02-15 MED ORDER — 0.9 % SODIUM CHLORIDE (POUR BTL) OPTIME
TOPICAL | Status: DC | PRN
  Administered 2021-02-15: 1000 mL

## 2021-02-15 MED ORDER — PROPOFOL 10 MG/ML IV BOLUS
INTRAVENOUS | Status: DC | PRN
  Administered 2021-02-15: 100 mg via INTRAVENOUS

## 2021-02-15 MED ORDER — FENTANYL CITRATE (PF) 100 MCG/2ML IJ SOLN
INTRAMUSCULAR | Status: AC
  Filled 2021-02-15: qty 2

## 2021-02-15 MED ORDER — ROCURONIUM BROMIDE 100 MG/10ML IV SOLN
INTRAVENOUS | Status: DC | PRN
  Administered 2021-02-15 (×2): 10 mg via INTRAVENOUS
  Administered 2021-02-15: 50 mg via INTRAVENOUS

## 2021-02-15 MED ORDER — CEFAZOLIN SODIUM-DEXTROSE 2-4 GM/100ML-% IV SOLN
INTRAVENOUS | Status: AC
  Filled 2021-02-15: qty 100

## 2021-02-15 MED ORDER — PHENYLEPHRINE HCL-NACL 20-0.9 MG/250ML-% IV SOLN
INTRAVENOUS | Status: DC | PRN
  Administered 2021-02-15: 30 ug/min via INTRAVENOUS

## 2021-02-15 MED ORDER — ACETAMINOPHEN 10 MG/ML IV SOLN
1000.0000 mg | Freq: Once | INTRAVENOUS | Status: DC | PRN
  Administered 2021-02-15: 1000 mg via INTRAVENOUS

## 2021-02-15 MED ORDER — BUPIVACAINE HCL (PF) 0.25 % IJ SOLN
INTRAMUSCULAR | Status: AC
  Filled 2021-02-15: qty 30

## 2021-02-15 MED ORDER — BUPIVACAINE LIPOSOME 1.3 % IJ SUSP
INTRAMUSCULAR | Status: DC | PRN
  Administered 2021-02-15: 20 mL

## 2021-02-15 MED ORDER — ACETAMINOPHEN 10 MG/ML IV SOLN
INTRAVENOUS | Status: AC
  Filled 2021-02-15: qty 100

## 2021-02-15 MED ORDER — LACTATED RINGERS IV SOLN: INTRAVENOUS | Status: DC

## 2021-02-15 MED ORDER — LIDOCAINE HCL (PF) 2 % IJ SOLN
INTRAMUSCULAR | Status: AC
  Filled 2021-02-15: qty 5

## 2021-02-15 MED ORDER — TRAMADOL HCL 50 MG PO TABS: 50.0000 mg | ORAL_TABLET | Freq: Four times a day (QID) | ORAL | 0 refills | Status: AC | PRN

## 2021-02-15 MED ORDER — ONDANSETRON HCL 4 MG/2ML IJ SOLN
INTRAMUSCULAR | Status: DC | PRN
Start: 2021-02-15 — End: 2021-02-15
  Administered 2021-02-15: 4 mg via INTRAVENOUS

## 2021-02-15 MED ORDER — DEXMEDETOMIDINE (PRECEDEX) IN NS 20 MCG/5ML (4 MCG/ML) IV SYRINGE
PREFILLED_SYRINGE | INTRAVENOUS | Status: AC
  Filled 2021-02-15: qty 5

## 2021-02-15 MED ORDER — LIDOCAINE HCL (CARDIAC) PF 100 MG/5ML IV SOSY
PREFILLED_SYRINGE | INTRAVENOUS | Status: DC | PRN
  Administered 2021-02-15: 50 mg via INTRAVENOUS

## 2021-02-15 SURGICAL SUPPLY — 37 items
ADH SKN CLS APL DERMABOND .7 (GAUZE/BANDAGES/DRESSINGS) ×1
APL PRP STRL LF DISP 70% ISPRP (MISCELLANEOUS) ×2
BAG COUNTER SPONGE SURGICOUNT (BAG) ×2 IMPLANT
BAG SPNG CNTER NS LX DISP (BAG) ×1
BLADE SURG 15 STRL LF DISP TIS (BLADE) ×1 IMPLANT
BLADE SURG 15 STRL SS (BLADE) ×2
CHLORAPREP W/TINT 26 (MISCELLANEOUS) ×4 IMPLANT
COVER SURGICAL LIGHT HANDLE (MISCELLANEOUS) ×2 IMPLANT
DECANTER SPIKE VIAL GLASS SM (MISCELLANEOUS) ×2 IMPLANT
DERMABOND ADVANCED (GAUZE/BANDAGES/DRESSINGS) ×1
DERMABOND ADVANCED .7 DNX12 (GAUZE/BANDAGES/DRESSINGS) ×1 IMPLANT
DRAIN PENROSE 0.5X18 (DRAIN) ×2 IMPLANT
DRAPE LAPAROTOMY TRNSV 102X78 (DRAPES) ×2 IMPLANT
ELECT REM PT RETURN 15FT ADLT (MISCELLANEOUS) ×2 IMPLANT
GLOVE SURG ORTHO LTX SZ8 (GLOVE) ×2 IMPLANT
GLOVE SURG SYN 7.5  E (GLOVE) ×2
GLOVE SURG SYN 7.5 E (GLOVE) ×1 IMPLANT
GLOVE SURG UNDER POLY LF SZ7 (GLOVE) ×2 IMPLANT
GOWN STRL REUS W/TWL XL LVL3 (GOWN DISPOSABLE) ×4 IMPLANT
KIT BASIN OR (CUSTOM PROCEDURE TRAY) ×2 IMPLANT
KIT TURNOVER KIT A (KITS) ×2 IMPLANT
MESH ULTRAPRO 3X6 7.6X15CM (Mesh General) ×2 IMPLANT
NEEDLE HYPO 25X1 1.5 SAFETY (NEEDLE) ×2 IMPLANT
NS IRRIG 1000ML POUR BTL (IV SOLUTION) ×2 IMPLANT
PACK BASIC VI WITH GOWN DISP (CUSTOM PROCEDURE TRAY) ×2 IMPLANT
PENCIL SMOKE EVACUATOR (MISCELLANEOUS) IMPLANT
SPONGE T-LAP 4X18 ~~LOC~~+RFID (SPONGE) ×6 IMPLANT
STRIP CLOSURE SKIN 1/2X4 (GAUZE/BANDAGES/DRESSINGS) ×2 IMPLANT
SUT MNCRL AB 4-0 PS2 18 (SUTURE) ×2 IMPLANT
SUT NOVA NAB GS-21 0 18 T12 DT (SUTURE) IMPLANT
SUT NOVA NAB GS-22 2 0 T19 (SUTURE) ×4 IMPLANT
SUT SILK 2 0 SH (SUTURE) ×2 IMPLANT
SUT VIC AB 0 CT2 27 (SUTURE) ×2 IMPLANT
SUT VIC AB 3-0 SH 18 (SUTURE) ×2 IMPLANT
SYR BULB IRRIG 60ML STRL (SYRINGE) ×2 IMPLANT
SYR CONTROL 10ML LL (SYRINGE) ×2 IMPLANT
TOWEL OR 17X26 10 PK STRL BLUE (TOWEL DISPOSABLE) ×2 IMPLANT

## 2021-02-15 NOTE — Op Note (Signed)
Procedure Note  Pre-operative Diagnosis:  right inguinal hernia, incarcerated  Post-operative Diagnosis: same  Procedure:  Open right inguinal hernia repair with mesh  Surgeon:  Armandina Gemma, MD  Anesthesia:  General  Preparation:  Chlora-prep  Estimated Blood Loss: minimal  Complications:  none  Indications: Patient is referred by Dr. Kerin Perna at [facility] for surgical evaluation and management of an enlarging right inguinal hernia.  Patient has a history of left inguinal hernia repair performed 7 years ago by Dr. Aviva Signs in [place].  About 3 years ago the patient developed evidence of a right inguinal hernia.  This is continued to enlarge in size and now extends into the right hemiscrotum.  Patient has had intermittent episodes of discomfort.  He has noted some problems with constipation and requires a laxative.  He has not had any other abdominal surgery.  He presents today accompanied by his daughter who is a Marine scientist for surgical evaluation for repair.  Patient has not had any difficulties since his left inguinal hernia repair.  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  The patient does have a history of coronary artery disease and underwent bypass surgery with valve replacement in June 2021.  He is followed by Dr. Domenic Polite from cardiology in [place].  Procedure Details  The patient was evaluated in the holding area. All of the patient's questions were answered and the proposed procedure was confirmed. The site of the procedure was properly marked. The patient was taken to the Operating Room, identified by name, and the procedure verified as inguinal hernia repair.  The patient was placed in the supine position and underwent induction of anesthesia. A "Time Out" was performed per routine. There was a large hernia present which was reduced with considerable manipulation. The lower abdomen and groin were prepped and draped in the usual aseptic fashion.  After ascertaining that an adequate level  of anesthesia had been obtained, an incision was made in the groin with a #10 blade.  Dissection was carried through the subcutaneous tissues and hemostasis obtained with the electrocautery.  A Gelpi retractor was placed for exposure.  The external oblique fascia was incised in line with it's fibers and extended through the external inguinal ring. There was a large indirect hernia sac extending down into the right hemiscrotum.  This was dissected out and the majority excised using the electrocautery.  The cord structures were dissected out of the inguinal canal and encircled with a Penrose drain.  The floor of the inguinal canal was dissected out. There was a large direct hernia defect with a large sac present.  The sac was opened and the contents reduced.  The neck of the sac was closed with a 0-Vicryl pursestring type suture.  The direct defect was closed with interrupted figure of eight 0-Novofil sutures.  The internal ring was closed laterally with an interrupted 0-Novofil suture.  The floor of the inguinal canal was reconstructed with Ethicon Ultrapro mesh cut to the appropriate dimensions.  It was secured to the pubic tubercle with a 2-0 Novafil suture and along the inguinal ligament with a running 2-0 Novafil suture.  Mesh was split to accommodate the cord structures.  The superior margin of the mesh was secured to the transversalis and internal oblique musculature with interrupted 2-0 Novafil sutures.  The tails of the mesh were overlapped lateral to the cord structures and secured to the inguinal ligament with interrupted 2-0 Novafil sutures to recreate the internal inguinal ring.  Cord structures were returned to the inguinal  canal.  Local anesthetic (Exparel) was infiltrated throughout the field.  External oblique fascia was closed with interrupted 3-0 Vicryl sutures.  Subcutaneous tissues were closed with interrupted 3-0 Vicryl sutures.  Skin was anesthetized with local anesthetic, and the skin edges  were re-approximated with a running 4-0 Monocryl suture.  Wound was washed and dried and Dermabond was applied.  Instrument, sponge, and needle counts were correct prior to closure and at the conclusion of the case.  The patient tolerated the procedure well.  The patient was awakened from anesthesia and brought to the recovery room in stable condition.  Armandina Gemma, MD [facility] Office: [phone]

## 2021-02-15 NOTE — Anesthesia Procedure Notes (Signed)
Procedure Name: Intubation Date/Time: 02/15/2021 7:45 AM Performed by: Lavina Hamman, CRNA Pre-anesthesia Checklist: Patient identified, Emergency Drugs available, Suction available, Patient being monitored and Timeout performed Patient Re-evaluated:Patient Re-evaluated prior to induction Oxygen Delivery Method: Circle system utilized Preoxygenation: Pre-oxygenation with 100% oxygen Induction Type: IV induction Ventilation: Mask ventilation without difficulty Laryngoscope Size: Mac and 4 Grade View: Grade I Tube type: Oral Tube size: 7.5 mm Number of attempts: 1 Airway Equipment and Method: Stylet Placement Confirmation: ETT inserted through vocal cords under direct vision, positive ETCO2, CO2 detector and breath sounds checked- equal and bilateral Secured at: 22 cm Tube secured with: Tape Dental Injury: Teeth and Oropharynx as per pre-operative assessment

## 2021-02-15 NOTE — Interval H&P Note (Signed)
History and Physical Interval Note:  02/15/2021 6:59 AM  William Walker  has presented today for surgery, with the diagnosis of INCARCERATED RIGHT INGUINAL HERNIA.  The various methods of treatment have been discussed with the patient and family. After consideration of risks, benefits and other options for treatment, the patient has consented to    Procedure(s): OPEN RIGHT INGUINAL HERNIA REPAIR WITH MESH (Right) as a surgical intervention.    The patient's history has been reviewed, patient examined, no change in status, stable for surgery.  I have reviewed the patient's chart and labs.  Questions were answered to the patient's satisfaction.    Armandina Gemma, [facility] [place] practice Office: [place]

## 2021-02-15 NOTE — Transfer of Care (Signed)
Immediate Anesthesia Transfer of Care Note  Patient: William Walker  Procedure(s) Performed: Procedure(s): OPEN RIGHT INGUINAL HERNIA REPAIR WITH MESH (Right)  Patient Location: PACU  Anesthesia Type:General  Level of Consciousness:  sedated, patient cooperative and responds to stimulation  Airway & Oxygen Therapy:Patient Spontanous Breathing and Patient connected to face mask oxgen  Post-op Assessment:  Report given to PACU RN and Post -op Vital signs reviewed and stable  Post vital signs:  Reviewed and stable  Last Vitals:  Vitals:   02/15/21 0548  BP: (!) 146/73  Pulse: 67  Resp: 18  Temp: 36.9 C  SpO2: [id number]    Complications: No apparent anesthesia complications

## 2021-02-15 NOTE — Anesthesia Postprocedure Evaluation (Signed)
Anesthesia Post Note  Patient: William Walker  Procedure(s) Performed: OPEN RIGHT INGUINAL HERNIA REPAIR WITH MESH (Right: Inguinal)     Patient location during evaluation: PACU Anesthesia Type: General Level of consciousness: awake and alert Pain management: pain level controlled Vital Signs Assessment: post-procedure vital signs reviewed and stable Respiratory status: spontaneous breathing, nonlabored ventilation, respiratory function stable and patient connected to nasal cannula oxygen Cardiovascular status: blood pressure returned to baseline and stable Postop Assessment: no apparent nausea or vomiting Anesthetic complications: no   No notable events documented.  Last Vitals:  Vitals:   02/15/21 1000 02/15/21 1030  BP: 135/72 (!) 150/74  Pulse: 69 71  Resp: 16 18  Temp:  (!) 36.4 C  SpO2: 94% 95%    Last Pain:  Vitals:   02/15/21 1030  TempSrc:   PainSc: 3                  Pinchas Reither P Janeann Paisley

## 2021-02-16 ENCOUNTER — Encounter (HOSPITAL_COMMUNITY): Payer: Self-pay | Admitting: Surgery

## 2021-03-29 DIAGNOSIS — Z23 Encounter for immunization: Secondary | ICD-10-CM | POA: Diagnosis not present

## 2021-04-02 IMAGING — DX DG CHEST 1V PORT
1 series · 1 of 1 positions shown · non-contrast
Comparison: 11/15/2019

CLINICAL DATA: Preop evaluation for upcoming coronary bypass
grafting

EXAM:
PORTABLE CHEST 1 VIEW

[chest ap]
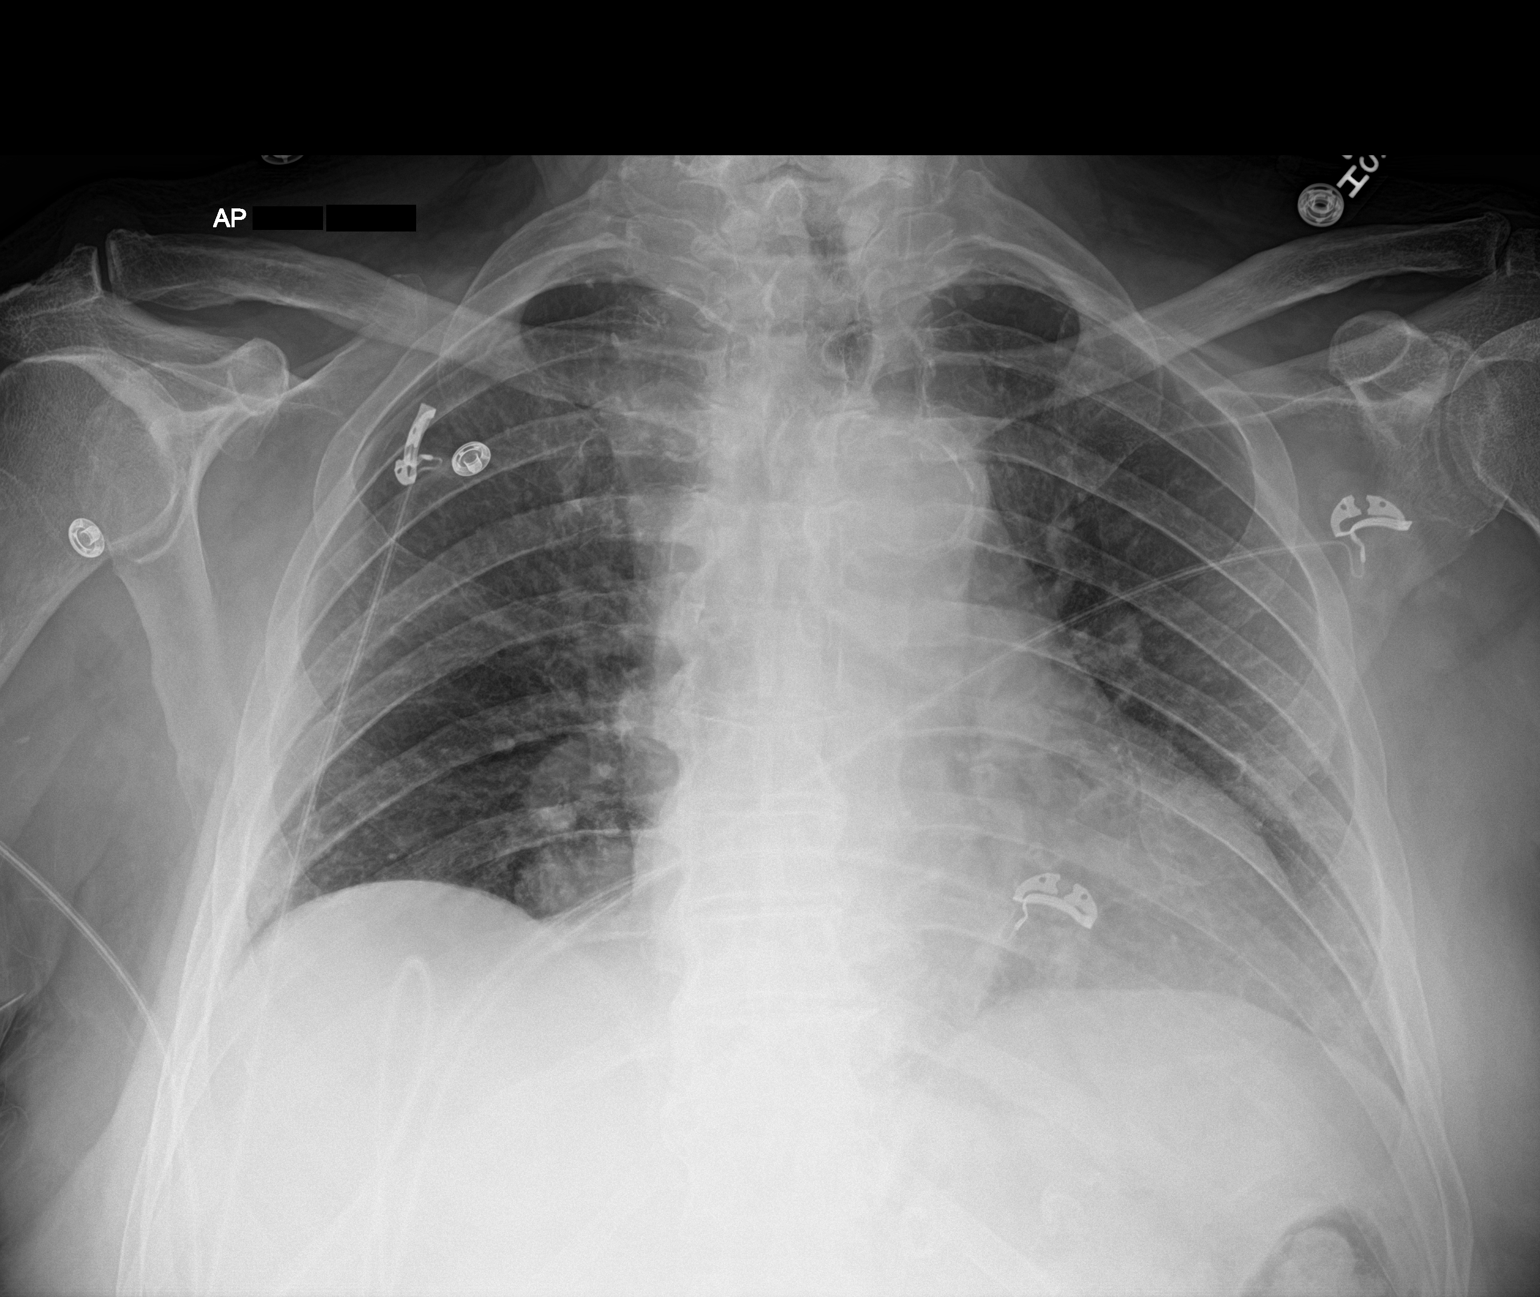

[1 of 1 positions shown; findings below may reference images not displayed]

FINDINGS: Cardiac shadow is mildly enlarged but stable. Aortic calcifications
are again seen and stable. Previously noted hiatal hernia is less
well appreciated on today's exam. The lungs are clear. No bony
abnormality is noted.
IMPRESSION: No acute abnormality noted.

Aortic Atherosclerosis ([id number]).

## 2021-04-04 IMAGING — DX DG CHEST 1V PORT
1 series · 1 of 1 positions shown · non-contrast
Comparison: Single-view of the chest 11/23/2019 and 11/22/2019.

CLINICAL DATA: Patient status post CABG 11/22/2019.

EXAM:
PORTABLE CHEST 1 VIEW

[chest]
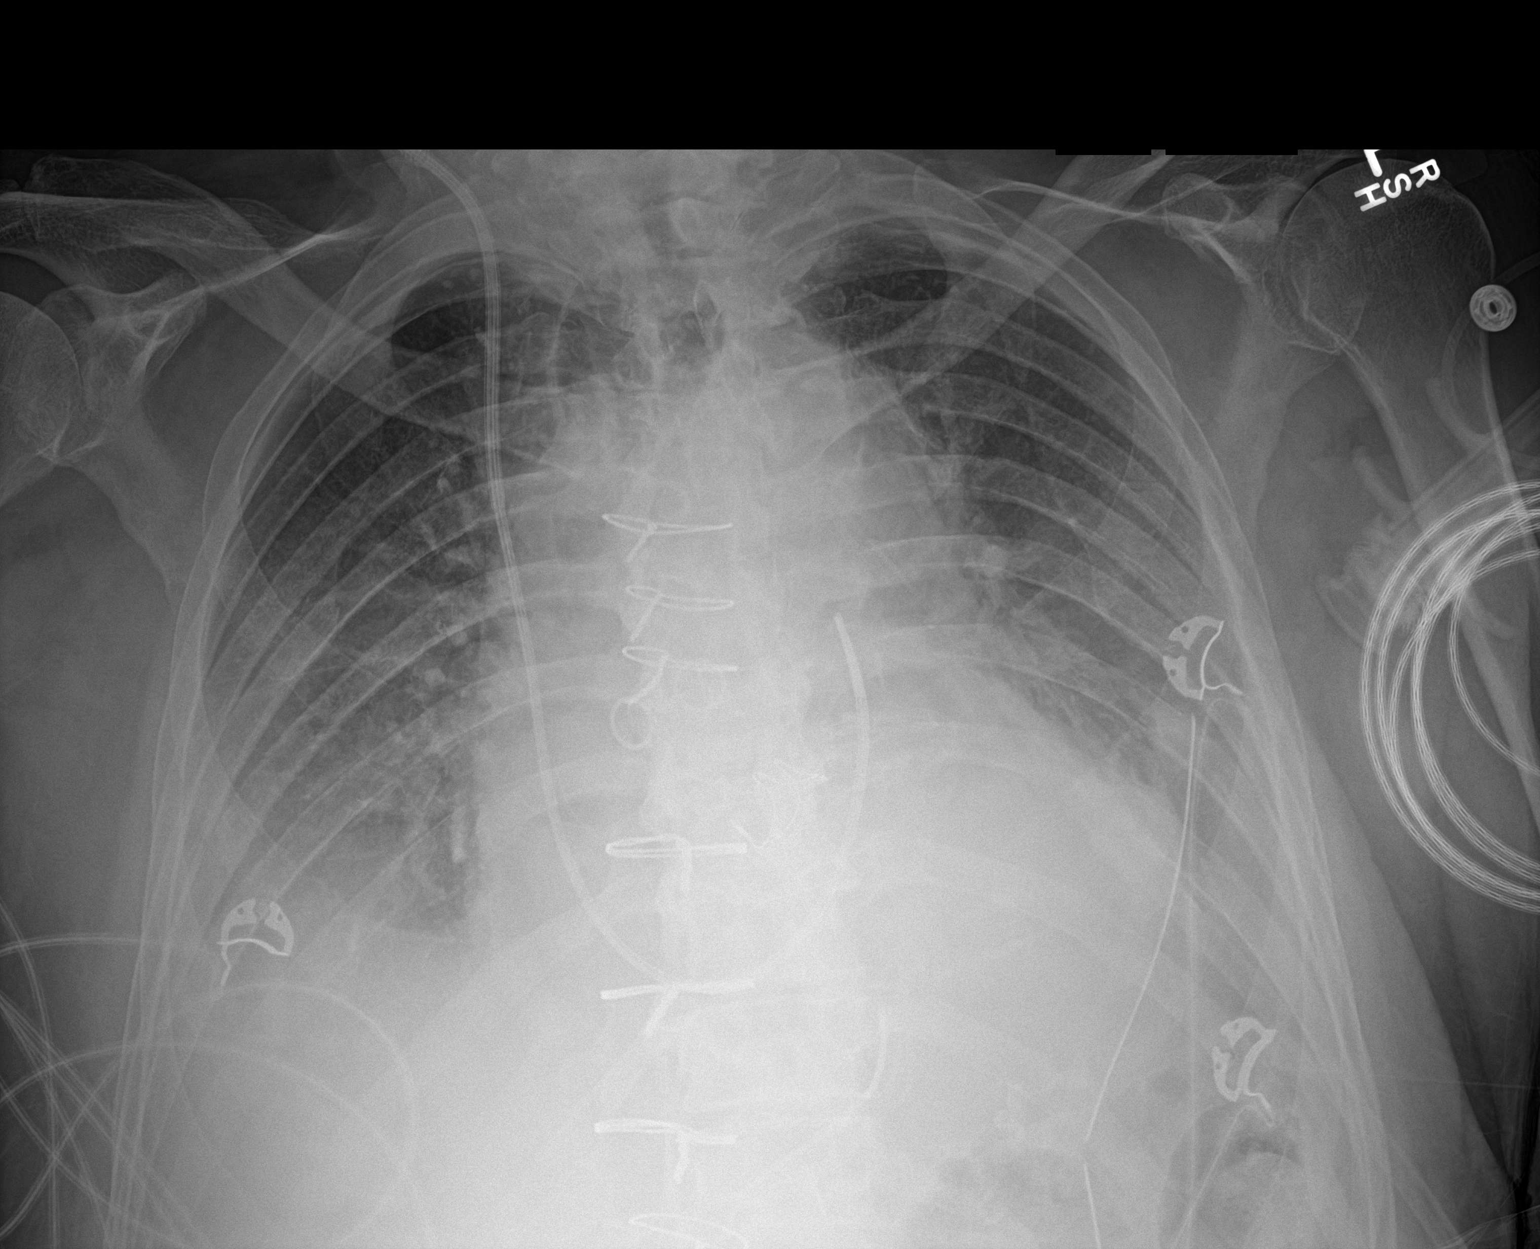

[1 of 1 positions shown; findings below may reference images not displayed]

FINDINGS: Swan-Ganz catheter, mediastinal drain and left chest tube remain in
place. Moderate right pleural effusion and basilar atelectasis have
increased. Small to moderate left pleural effusion and associated
atelectasis are unchanged. No pneumothorax. Heart size is enlarged.
IMPRESSION: No change in support tubes and lines. Negative for pneumothorax with
a left chest tube in place.

Small to moderate bilateral pleural effusions and basilar airspace
disease. The patient's right effusion has increased since
yesterday's exam

## 2021-04-05 IMAGING — DX DG CHEST 1V PORT
1 series · 1 of 1 positions shown · non-contrast
Comparison: 11/24/2019

CLINICAL DATA: Follow-up chest tube

EXAM:
PORTABLE CHEST 1 VIEW

[chest]
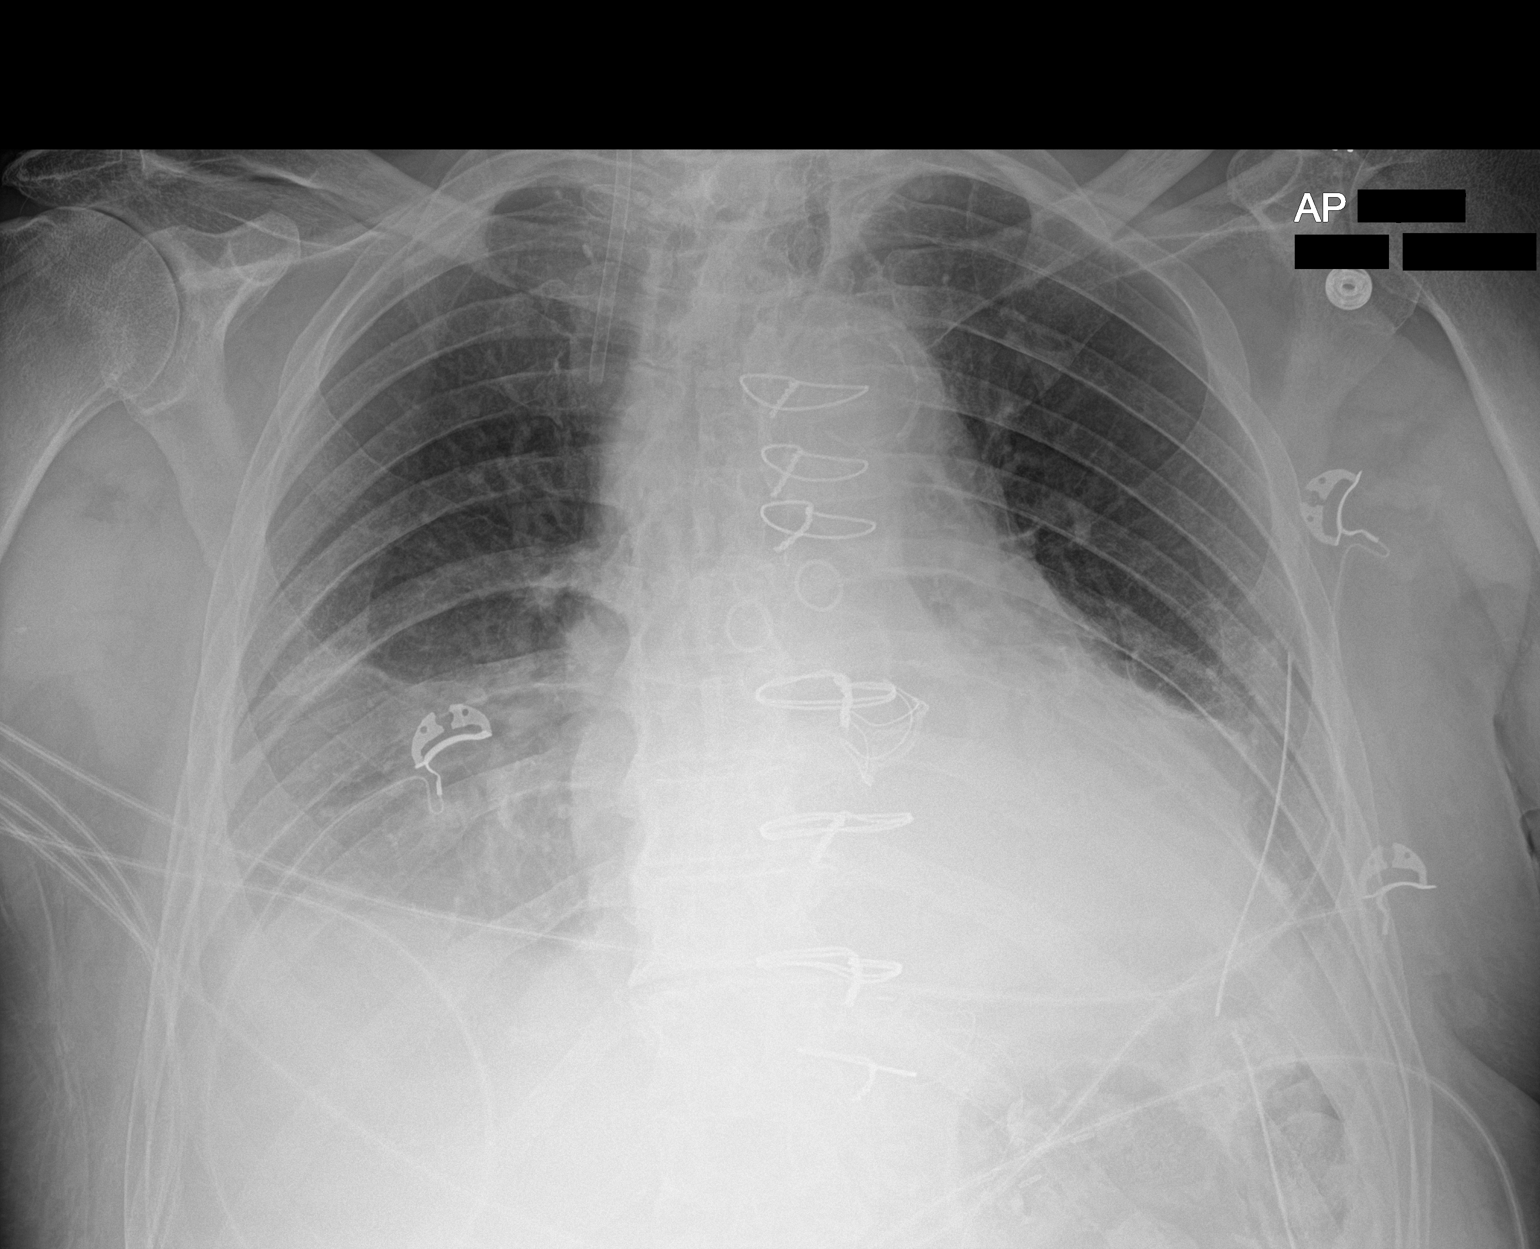

[1 of 1 positions shown; findings below may reference images not displayed]

FINDINGS: Cardiac shadow remains enlarged. Postsurgical changes are again
seen. Swan-Ganz catheter has been removed in the interval. Right
jugular sheath remains but is kinked at the skin surface. Left-sided
chest tube is again noted and stable. Small bilateral pleural
effusions are noted right greater than left. No pneumothorax is
noted.
IMPRESSION: Stable appearance of the chest with the exception of interval
removal of the Swan-Ganz catheter.

## 2021-04-11 IMAGING — DX DG CHEST 1V PORT
1 series · 1 of 1 positions shown · non-contrast
Comparison: Three days ago

CLINICAL DATA: Chest tube

EXAM:
PORTABLE CHEST 1 VIEW

[chest ap]
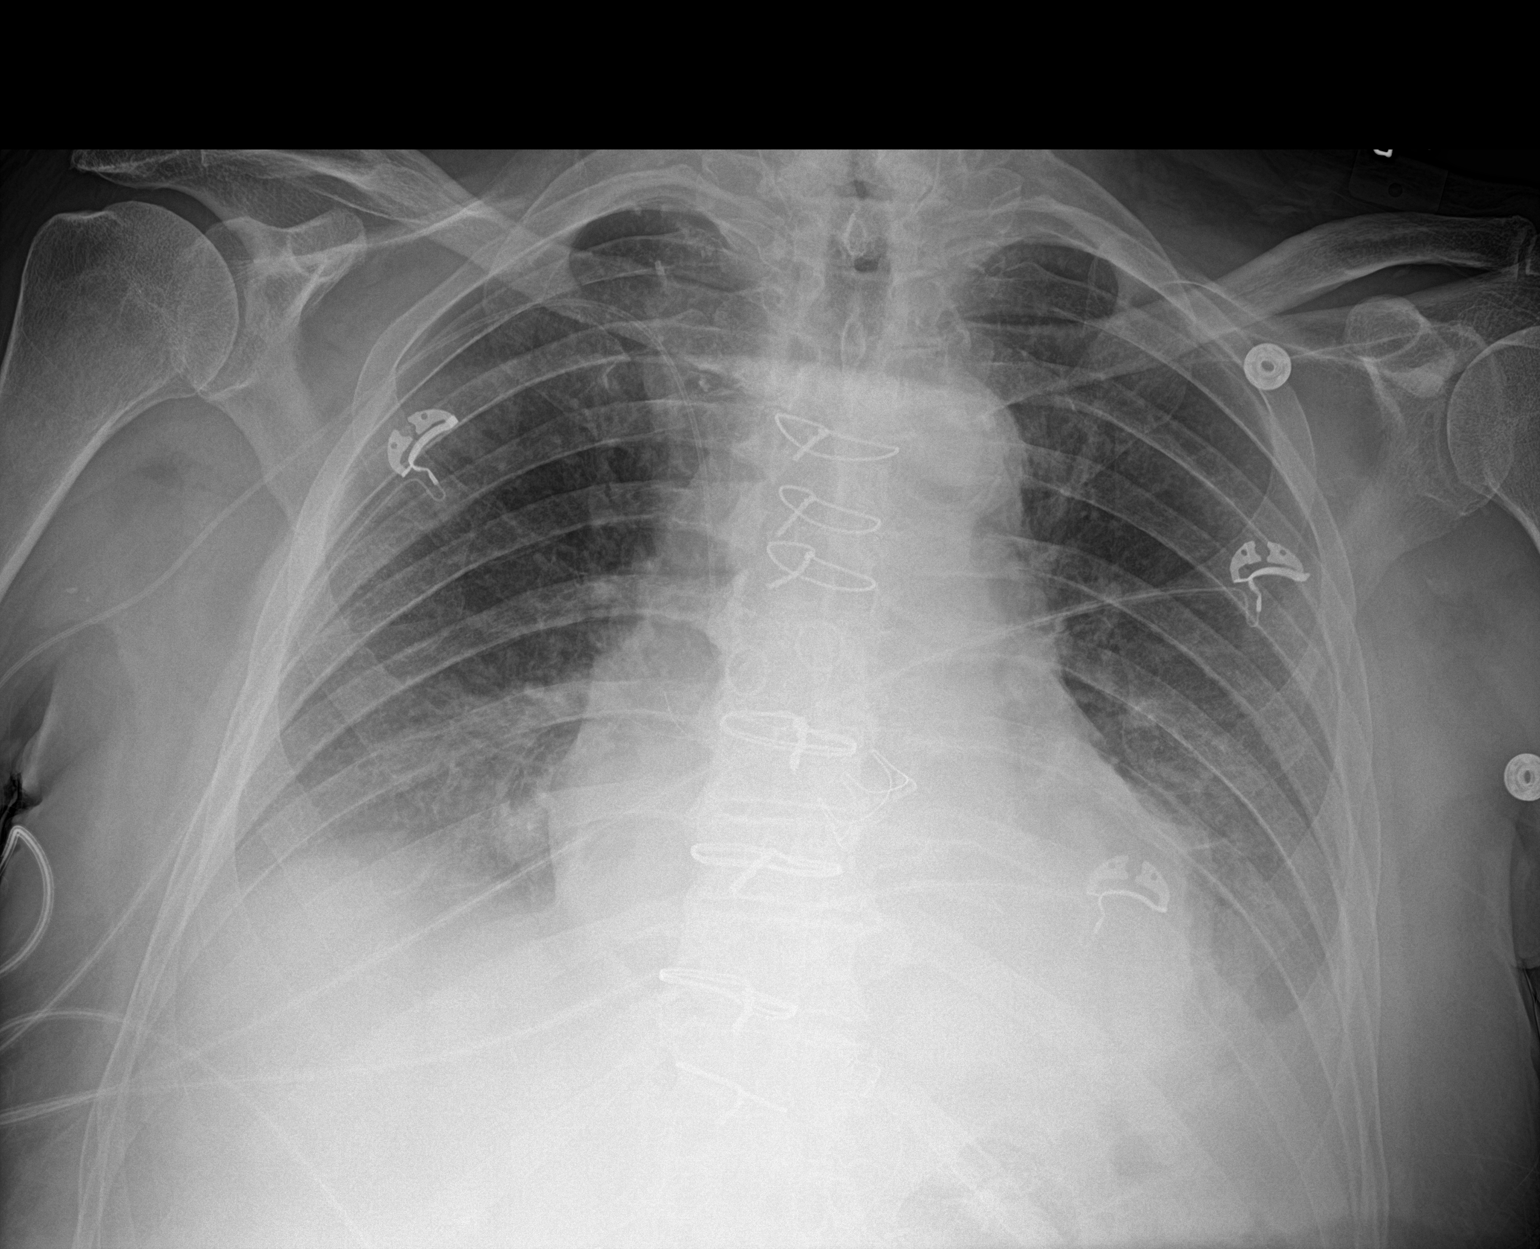

[1 of 1 positions shown; findings below may reference images not displayed]

FINDINGS: PICC on the right with tip at the SVC. Aortic valve replacement and
CABG. Pleural effusions and presumed atelectasis. No edema or
pneumothorax.
IMPRESSION: Stable pleural effusions and presumed atelectasis.

## 2021-04-23 ENCOUNTER — Ambulatory Visit: Payer: Medicare Other | Admitting: Cardiology

## 2021-04-23 ENCOUNTER — Other Ambulatory Visit: Payer: Self-pay

## 2021-04-23 ENCOUNTER — Other Ambulatory Visit: Payer: Self-pay | Admitting: *Deleted

## 2021-04-23 ENCOUNTER — Encounter: Payer: Self-pay | Admitting: Cardiology

## 2021-04-23 VITALS — BP 132/70 | HR 67 | Ht 70.0 in | Wt 193.4 lb

## 2021-04-23 DIAGNOSIS — I25119 Atherosclerotic heart disease of native coronary artery with unspecified angina pectoris: Secondary | ICD-10-CM

## 2021-04-23 DIAGNOSIS — I779 Disorder of arteries and arterioles, unspecified: Secondary | ICD-10-CM | POA: Diagnosis not present

## 2021-04-23 DIAGNOSIS — I483 Typical atrial flutter: Secondary | ICD-10-CM | POA: Diagnosis not present

## 2021-04-23 DIAGNOSIS — Z79899 Other long term (current) drug therapy: Secondary | ICD-10-CM

## 2021-04-23 NOTE — Progress Notes (Signed)
Cardiology Office Note  Date: 04/23/2021   ID: William Walker, DOB 28-Jan-1927, MRN [id number]  PCP:  Cory Munch, PA-C  Cardiologist:  [name], MD Electrophysiologist:  None   Chief Complaint  Patient presents with   Cardiac follow-up     History of Present Illness: William Walker is a 87 y.o. male last seen in August.  He is here with his daughter for a follow-up visit. He underwent right inguinal herniorrhaphy mesh repair under general anesthesia back in September, no obvious perioperative cardiac complications.  He does not describe any angina symptoms, no sense of palpitations.  I reviewed his cardiac medications which are noted below.  He is tolerating low-dose Eliquis, no obvious bleeding problems.  We discussed getting updated blood work.  He had a visit with Dr. Donnetta Hutching in July of her evaluation of carotid artery disease.  Review of imaging studies was felt to be consistent with 60 to 79% RICA stenosis with medical therapy recommended.  We will get follow-up carotid Dopplers in June of next year.  Past Medical History:  Diagnosis Date   Aortic stenosis    a. s/p AVR in 11/2019   Arthritis    Coronary atherosclerosis of native coronary artery    a. s/p prior stenting of LAD b. cath in 11/2019 showing LM and 3-vessel CAD--> s/p CABG on 11/22/2019 with LIMA-LAD, SVG-dRCA and seq SVG-RI-OM1 and complicated by post-cardiotomy shock   CVD (cerebrovascular disease)    Essential hypertension    GERD (gastroesophageal reflux disease)    History of kidney stones    Hyperlipidemia    MI (myocardial infarction) [facility])    October 1999   Skin cancer    Type 2 diabetes mellitus with diabetic neuropathy [facility])     Past Surgical History:  Procedure Laterality Date   AORTIC VALVE REPLACEMENT N/A 11/22/2019   Procedure: AORTIC VALVE REPLACEMENT (AVR) USING INSPIRIS 23MM;  Surgeon: [name], MD;  Location: [place];  Service: Open Heart Surgery;  Laterality: N/A;    Arch cerebral ateriogram  12/11/2005   Arvilla Meres Early MD   CATARACT EXTRACTION, BILATERAL     CORONARY ARTERY BYPASS GRAFT N/A 11/22/2019   Procedure: CORONARY ARTERY BYPASS GRAFTING (CABG), ON PUMP, TIMES FOUR , USING LEFT INTERNAL MAMMARY ARTERY AND ENDOSCOPICALLY HARVESTED RIGHT GREATER SAPHENOUS VEIN;  Surgeon: [name], MD;  Location: [place];  Service: Open Heart Surgery;  Laterality: N/A;  SEQUENTIAL RAMUS INTERMEDIATE TO OM1 SAPHENOUS VEIN TO DISTAL PDA LIMA TO LAD   INGUINAL HERNIA REPAIR Left 12/09/2012   Procedure: HERNIA REPAIR INGUINAL with mesh;  Surgeon: [name], MD;  Location: [facility];  Service: General;  Laterality: Left;   INGUINAL HERNIA REPAIR Right 02/15/2021   Procedure: OPEN RIGHT INGUINAL HERNIA REPAIR WITH MESH;  Surgeon: Armandina Gemma, MD;  Location: [facility];  Service: General;  Laterality: Right;   INSERTION OF MESH Left 12/09/2012   Procedure: INSERTION OF MESH;  Surgeon: [name], MD;  Location: [facility];  Service: General;  Laterality: Left;   RIGHT/LEFT HEART CATH AND CORONARY ANGIOGRAPHY N/A 10/07/2017   Procedure: RIGHT/LEFT HEART CATH AND CORONARY ANGIOGRAPHY;  Surgeon: [name], MD;  Location: [place] [facility];  Service: Cardiovascular;  Laterality: N/A;   RIGHT/LEFT HEART CATH AND CORONARY ANGIOGRAPHY N/A 11/16/2019   Procedure: RIGHT/LEFT HEART CATH AND CORONARY ANGIOGRAPHY;  Surgeon: Lorretta Harp, MD;  Location: [facility];  Service: Cardiovascular;  Laterality: N/A;  TEE WITHOUT CARDIOVERSION N/A 11/22/2019   Procedure: TRANSESOPHAGEAL ECHOCARDIOGRAM (TEE);  Surgeon: [name], MD;  Location: [facility];  Service: Open Heart Surgery;  Laterality: N/A;    Current Outpatient Medications  Medication Sig Dispense Refill   apixaban (ELIQUIS) 2.5 MG TABS tablet Take 1 tablet (2.5 mg total) by mouth 2 (two) times daily. 180 tablet 1   clindamycin (CLEOCIN) 300 MG capsule Take 900 mg by mouth See admin instructions. Prior to dental  work     docusate sodium (COLACE) 100 MG capsule Take 2 capsules (200 mg total) by mouth daily. 10 capsule 0   glipiZIDE (GLUCOTROL) 5 MG tablet Take 1 tablet (5 mg total) by mouth daily before breakfast. 30 tablet 0   levothyroxine (SYNTHROID) 75 MCG tablet Take 75 mcg by mouth daily before breakfast.     metolazone (ZAROXOLYN) 2.5 MG tablet Take 2.5 mg by mouth daily.     metoprolol succinate (TOPROL-XL) 25 MG 24 hr tablet TAKE ONE-HALF TABLET BY  MOUTH DAILY (Patient taking differently: Take 12.5 mg by mouth daily.) 45 tablet 3   Multiple Vitamin (MULTIVITAMIN WITH MINERALS) TABS tablet Take 1 tablet by mouth daily.     Multiple Vitamins-Minerals (PRESERVISION AREDS 2+MULTI VIT PO) Take 1 capsule by mouth in the morning and at bedtime.     pantoprazole (PROTONIX) 40 MG tablet Take 1 tablet (40 mg total) by mouth daily. 30 tablet 0   Polyethyl Glycol-Propyl Glycol (SYSTANE) 0.4-0.3 % SOLN Place 1 drop into both eyes daily as needed (dry eyes).     potassium chloride SA (KLOR-CON) 20 MEQ tablet Take 1 tablet (20 mEq total) by mouth daily. 90 tablet 3   pravastatin (PRAVACHOL) 40 MG tablet TAKE 1 TABLET BY MOUTH IN  THE EVENING 90 tablet 3   torsemide (DEMADEX) 20 MG tablet Take 20 mg by mouth daily.     traMADol (ULTRAM) 50 MG tablet Take 1-2 tablets (50-100 mg total) by mouth every 6 (six) hours as needed for moderate pain. 20 tablet 0   No current facility-administered medications for this visit.   Allergies:  Patient has no known allergies.   ROS: Chronic hearing loss.  Physical Exam: VS:  BP 132/70   Pulse 67   Ht 5\' 10"  (1.778 m)   Wt 193 lb 6.4 oz (87.7 kg)   SpO2 99%   BMI 27.75 kg/m , BMI Body mass index is 27.75 kg/m.  Wt Readings from Last 3 Encounters:  04/23/21 193 lb 6.4 oz (87.7 kg)  02/15/21 185 lb (83.9 kg)  01/30/21 185 lb (83.9 kg)    General: Elderly male, appears comfortable at rest. HEENT: Conjunctiva and lids normal, wearing a mask. Neck: Supple, no  elevated JVP or carotid bruits, no thyromegaly. Lungs: Clear to auscultation, nonlabored breathing at rest. Cardiac: Regular rate and rhythm, no S3, 2/6 systolic murmur, no pericardial rub. Extremities: Trace ankle edema.  ECG:  An ECG dated 01/18/2021 was personally reviewed today and demonstrated:  Atrial flutter with 3:1 block and IVCD .  Recent Labwork: 01/30/2021: BUN 48; Creatinine, Ser 1.74; Hemoglobin 12.9; Platelets 176; Potassium 3.4; Sodium 140     Component Value Date/Time   CHOL 166 11/16/2019 0026   CHOL 179 05/05/2018 1001   TRIG 88 11/16/2019 0026   TRIG 134 05/05/2018 1001   HDL 47 11/16/2019 0026   HDL 54 05/05/2018 1001   CHOLHDL 3.5 11/16/2019 0026   VLDL 18 11/16/2019 0026   LDLCALC 101 (H) 11/16/2019 0026  Other Studies Reviewed Today:  Carotid Dopplers 11/07/2020: IMPRESSION: 1. Chronic occlusion of the left internal carotid artery and left vertebral artery. 2. Severe (70-99%) stenosis proximal right internal carotid artery secondary to bulky calcified atherosclerotic plaque. As previously noted, the degree of stenosis may be slightly exaggerated from velocity acceleration as a compensatory affect of the left internal carotid artery occlusion. If more precise assessment is clinically warranted, formal cerebral arteriogram may be considered.  Echocardiogram 11/17/2020:  1. Left ventricular ejection fraction, by estimation, is 55 to 60%. The  left ventricle has normal function. Left ventricular endocardial border  not optimally defined to evaluate regional wall motion. There is moderate  left ventricular hypertrophy. Left  ventricular diastolic parameters are consistent with Grade III diastolic  dysfunction (restrictive). Elevated left atrial pressure.   2. Grossly RV appears moderately enlarged with mildly decreased systolic  function. . Right ventricular systolic function was not well visualized.  The right ventricular size is not well visualized.   3.  Left atrial size was severely dilated.   4. The mitral valve is normal in structure. Mild mitral valve  regurgitation. No evidence of mitral stenosis.   5. Tricuspid valve regurgitation is moderate.   6. 23 mm Inspiris Resilia tissue valve is in the aortic valve position. .  The aortic valve has been repaired/replaced. Aortic valve regurgitation is  mild.   7. The inferior vena cava is dilated in size with >50% respiratory  variability, suggesting right atrial pressure of 8 mmHg.   Assessment and Plan:  1.  Persistent, typical atrial flutter with controlled ventricular response and CHA2DS2-VASc score of 6.  He is on Eliquis 2.5 mg twice daily for stroke prophylaxis and remains on low-dose Toprol-XL as well.  Check CBC and BMET.  2.  CAD status post CABG with associated bioprosthetic AVR for aortic stenosis in June 2021.  He reports no active angina at this time.  Echocardiogram in June revealed LVEF 55 to 60% with stable bioprosthetic AVR and mild aortic regurgitation.  He is no longer on aspirin given use of Eliquis.  Continue Toprol-XL and Pravachol.  3.  HFpEF with chronic diastolic heart failure, clinically stable at this point.  He remains on Demadex and Zaroxolyn with potassium supplement.  4.  CKD stage IIIb, last creatinine 1.74.  5.  Carotid artery disease, follow-up carotid Dopplers in June 2023.  Medication Adjustments/Labs and Tests Ordered: Current medicines are reviewed at length with the patient today.  Concerns regarding medicines are outlined above.   Tests Ordered: Orders Placed This Encounter  Procedures   US Carotid Bilateral   Basic metabolic panel   CBC     Medication Changes: No orders of the defined types were placed in this encounter.   Disposition:  Follow up  6 months.  Signed, Satira Sark, MD, [facility] 04/23/2021 1:31 PM  [place] [facility] [place], [place], [place] [address] Phone: [phone]; Fax: [phone]

## 2021-04-23 NOTE — Patient Instructions (Addendum)
Medication Instructions:  Your physician recommends that you continue on your current medications as directed. Please refer to the Current Medication list given to you today.  Labwork: BMET & CBC today at [facility] or [facility]  Testing/Procedures: Your physician has requested that you have a carotid duplex in June 2023. This test is an ultrasound of the carotid arteries in your neck. It looks at blood flow through these arteries that supply the brain with blood. Allow one hour for this exam. There are no restrictions or special instructions.  Follow-Up: Your physician recommends that you schedule a follow-up appointment in: 6 months  Any Other Special Instructions Will Be Listed Below (If Applicable).  If you need a refill on your cardiac medications before your next appointment, please call your pharmacy.

## 2021-04-23 NOTE — Addendum Note (Signed)
Addended by: Merlene Laughter on: 04/23/2021 01:37 PM   Modules accepted: Orders

## 2021-04-24 ENCOUNTER — Telehealth: Payer: Self-pay | Admitting: *Deleted

## 2021-04-24 LAB — BASIC METABOLIC PANEL
BUN/Creatinine Ratio: 34 — ABNORMAL HIGH (ref 10–24)
BUN: 63 mg/dL — ABNORMAL HIGH (ref 10–36)
CO2: 28 mmol/L (ref 20–29)
Calcium: 9.4 mg/dL (ref 8.6–10.2)
Chloride: 94 mmol/L — ABNORMAL LOW (ref 96–106)
Creatinine, Ser: 1.87 mg/dL — ABNORMAL HIGH (ref 0.76–1.27)
Glucose: 73 mg/dL (ref 70–99)
Potassium: 3.8 mmol/L (ref 3.5–5.2)
Sodium: 140 mmol/L (ref 134–144)
eGFR: 33 mL/min/{1.73_m2} — ABNORMAL LOW (ref >59)

## 2021-04-24 LAB — CBC
Hematocrit: 34.8 % — ABNORMAL LOW (ref 37.5–51.0)
Hemoglobin: 11.7 g/dL — ABNORMAL LOW (ref 13.0–17.7)
MCH: 33.5 pg — ABNORMAL HIGH (ref 26.6–33.0)
MCHC: 33.6 g/dL (ref 31.5–35.7)
MCV: 100 fL — ABNORMAL HIGH (ref 79–97)
Platelets: 159 10*3/uL (ref 150–450)
RBC: 3.49 x10E6/uL — ABNORMAL LOW (ref 4.14–5.80)
RDW: 12.9 % (ref 11.6–15.4)
WBC: 7.1 10*3/uL (ref 3.4–10.8)

## 2021-04-24 NOTE — Telephone Encounter (Signed)
-----   Message from Satira Sark, MD sent at 04/24/2021  8:05 AM EST ----- Results reviewed.  Hemoglobin is relatively stable at 11.7.  Creatinine 1.87 up from 1.74 with normal potassium.  Continue with current medications and follow-up plan.

## 2021-04-24 NOTE — Telephone Encounter (Signed)
Patient's daughter [name] informed. Copy sent to PCP

## 2021-06-07 DIAGNOSIS — J329 Chronic sinusitis, unspecified: Secondary | ICD-10-CM | POA: Diagnosis not present

## 2021-06-07 DIAGNOSIS — E118 Type 2 diabetes mellitus with unspecified complications: Secondary | ICD-10-CM | POA: Diagnosis not present

## 2021-06-07 DIAGNOSIS — I351 Nonrheumatic aortic (valve) insufficiency: Secondary | ICD-10-CM | POA: Diagnosis not present

## 2021-07-20 ENCOUNTER — Other Ambulatory Visit: Payer: Self-pay | Admitting: Cardiology

## 2021-07-20 DIAGNOSIS — I483 Typical atrial flutter: Secondary | ICD-10-CM

## 2021-07-23 NOTE — Telephone Encounter (Signed)
Eliquis 2.5mg  refill request received. Patient is 88 years old, weight-87.7kg, Crea-1.87 on 04/23/2021, Diagnosis-Aflutter, and last seen by Dr. Domenic Polite on 04/23/2021. Dose is appropriate based on dosing criteria. Will send in refill to requested pharmacy.

## 2021-08-28 DIAGNOSIS — I25709 Atherosclerosis of coronary artery bypass graft(s), unspecified, with unspecified angina pectoris: Secondary | ICD-10-CM | POA: Diagnosis not present

## 2021-08-28 DIAGNOSIS — I351 Nonrheumatic aortic (valve) insufficiency: Secondary | ICD-10-CM | POA: Diagnosis not present

## 2021-08-28 DIAGNOSIS — E782 Mixed hyperlipidemia: Secondary | ICD-10-CM | POA: Diagnosis not present

## 2021-08-28 DIAGNOSIS — Z0001 Encounter for general adult medical examination with abnormal findings: Secondary | ICD-10-CM | POA: Diagnosis not present

## 2021-08-28 DIAGNOSIS — H900 Conductive hearing loss, bilateral: Secondary | ICD-10-CM | POA: Diagnosis not present

## 2021-08-28 DIAGNOSIS — E118 Type 2 diabetes mellitus with unspecified complications: Secondary | ICD-10-CM | POA: Diagnosis not present

## 2021-09-04 DIAGNOSIS — D225 Melanocytic nevi of trunk: Secondary | ICD-10-CM | POA: Diagnosis not present

## 2021-09-04 DIAGNOSIS — L308 Other specified dermatitis: Secondary | ICD-10-CM | POA: Diagnosis not present

## 2021-09-04 DIAGNOSIS — X32XXXD Exposure to sunlight, subsequent encounter: Secondary | ICD-10-CM | POA: Diagnosis not present

## 2021-09-04 DIAGNOSIS — L570 Actinic keratosis: Secondary | ICD-10-CM | POA: Diagnosis not present

## 2021-09-14 ENCOUNTER — Other Ambulatory Visit: Payer: Self-pay | Admitting: Cardiology

## 2021-10-11 DIAGNOSIS — H903 Sensorineural hearing loss, bilateral: Secondary | ICD-10-CM | POA: Diagnosis not present

## 2021-10-11 DIAGNOSIS — H6123 Impacted cerumen, bilateral: Secondary | ICD-10-CM | POA: Diagnosis not present

## 2021-10-13 ENCOUNTER — Other Ambulatory Visit: Payer: Self-pay | Admitting: Student

## 2021-11-02 ENCOUNTER — Ambulatory Visit (HOSPITAL_COMMUNITY)
Admission: RE | Admit: 2021-11-02 | Discharge: 2021-11-02 | Disposition: A | Discharge status: Home / Self Care | Payer: Medicare Other | Source: Ambulatory Visit | Attending: Cardiology | Admitting: Cardiology

## 2021-11-02 DIAGNOSIS — I6503 Occlusion and stenosis of bilateral vertebral arteries: Secondary | ICD-10-CM | POA: Diagnosis not present

## 2021-11-02 DIAGNOSIS — I10 Essential (primary) hypertension: Secondary | ICD-10-CM | POA: Diagnosis not present

## 2021-11-02 DIAGNOSIS — E119 Type 2 diabetes mellitus without complications: Secondary | ICD-10-CM | POA: Diagnosis not present

## 2021-11-02 DIAGNOSIS — E785 Hyperlipidemia, unspecified: Secondary | ICD-10-CM | POA: Diagnosis not present

## 2021-11-02 DIAGNOSIS — I779 Disorder of arteries and arterioles, unspecified: Secondary | ICD-10-CM | POA: Insufficient documentation

## 2021-11-07 ENCOUNTER — Encounter: Payer: Self-pay | Admitting: Student

## 2021-11-07 ENCOUNTER — Ambulatory Visit: Payer: Medicare Other | Admitting: Student

## 2021-11-07 VITALS — BP 142/70 | HR 77 | Ht 70.0 in | Wt 196.8 lb

## 2021-11-07 DIAGNOSIS — Z79899 Other long term (current) drug therapy: Secondary | ICD-10-CM | POA: Diagnosis not present

## 2021-11-07 DIAGNOSIS — E785 Hyperlipidemia, unspecified: Secondary | ICD-10-CM

## 2021-11-07 DIAGNOSIS — I2510 Atherosclerotic heart disease of native coronary artery without angina pectoris: Secondary | ICD-10-CM

## 2021-11-07 DIAGNOSIS — I5032 Chronic diastolic (congestive) heart failure: Secondary | ICD-10-CM

## 2021-11-07 DIAGNOSIS — I483 Typical atrial flutter: Secondary | ICD-10-CM

## 2021-11-07 DIAGNOSIS — I779 Disorder of arteries and arterioles, unspecified: Secondary | ICD-10-CM

## 2021-11-07 DIAGNOSIS — Z952 Presence of prosthetic heart valve: Secondary | ICD-10-CM | POA: Diagnosis not present

## 2021-11-07 MED ORDER — TORSEMIDE 20 MG PO TABS: ORAL_TABLET | ORAL | 0 refills | Status: DC

## 2021-11-07 MED ORDER — POTASSIUM CHLORIDE CRYS ER 20 MEQ PO TBCR: EXTENDED_RELEASE_TABLET | ORAL | 3 refills | Status: DC

## 2021-11-07 NOTE — Progress Notes (Signed)
Cardiology Office Note    Date:  11/07/2021   ID:  William Walker, DOB 09-16-1926, MRN [id number]  PCP:  Redmond School, MD  Cardiologist: [name], MD    Chief Complaint  Patient presents with   Follow-up    6 month visit    History of Present Illness:    William Walker is a 88 y.o. male  with past medical history of CAD (s/p prior stenting of LAD, cath in 11/2019 showing LM and 3-vessel CAD--> s/p CABG on 11/22/2019 with LIMA-LAD, SVG-dRCA and seq SVG-RI-OM1 and complicated by post-cardiotomy shock), HFpEF, persistent atrial flutter, severe AS (s/p bioprosthetic AVR on 11/22/2019), carotid artery stenosis (known LICA occlusion), HTN, HLD and hypothyroidism who presents to the office today for 39-monthfollow-up.  He was last examined by Dr. MDomenic Politein 04/2021 and had recently undergone inguinal hernia repair with no complications noted. He reported overall feeling well at the time of his visit and denied any recent anginal symptoms. He was continued on his current cardiac medications with plans for follow-up carotid dopplers in 11/2021. These were obtained on 11/02/2021 and showed known chronic occlusion of the LICA and greater than 70% stenosis of the RICA with similar peak velocities as compared to prior imaging. It was recommended he continue to follow with Vascular Surgery.  In talking the patient and his daughter today, he reports worsening lower extremity edema and orthopnea for the past few weeks. Says that he has been compliant with diuretic therapy and has frequent urination with this. His weight has increased by almost 5 pounds on his home scales as well. No specific chest pain or dyspnea on exertion. He does experience intermittent palpitations in the setting of atrial flutter and says his heart rate has been variable when checked at home but he is unsure of the accuracy of his home readings.   Past Medical History:  Diagnosis Date   Aortic stenosis    a. s/p AVR in 11/2019    Arthritis    Coronary atherosclerosis of native coronary artery    a. s/p prior stenting of LAD b. cath in 11/2019 showing LM and 3-vessel CAD--> s/p CABG on 11/22/2019 with LIMA-LAD, SVG-dRCA and seq SVG-RI-OM1 and complicated by post-cardiotomy shock   CVD (cerebrovascular disease)    Essential hypertension    GERD (gastroesophageal reflux disease)    History of kidney stones    Hyperlipidemia    MI (myocardial infarction) ([facility]    October 1999   Skin cancer    Type 2 diabetes mellitus with diabetic neuropathy ([facility]     Past Surgical History:  Procedure Laterality Date   AORTIC VALVE REPLACEMENT N/A 11/22/2019   Procedure: AORTIC VALVE REPLACEMENT (AVR) USING INSPIRIS 23MM;  Surgeon: GGrace Isaac MD;  Location: [facility]  Service: Open Heart Surgery;  Laterality: N/A;   Arch cerebral ateriogram  12/11/2005   TArvilla MeresEarly MD   CATARACT EXTRACTION, BILATERAL     CORONARY ARTERY BYPASS GRAFT N/A 11/22/2019   Procedure: CORONARY ARTERY BYPASS GRAFTING (CABG), ON PUMP, TIMES FOUR , USING LEFT INTERNAL MAMMARY ARTERY AND ENDOSCOPICALLY HARVESTED RIGHT GREATER SAPHENOUS VEIN;  Surgeon: GGrace Isaac MD;  Location: [facility]  Service: Open Heart Surgery;  Laterality: N/A;  SEQUENTIAL RAMUS INTERMEDIATE TO OM1 SAPHENOUS VEIN TO DISTAL PDA LIMA TO LAD   INGUINAL HERNIA REPAIR Left 12/09/2012   Procedure: HERNIA REPAIR INGUINAL with mesh;  Surgeon: MJamesetta So MD;  Location: [facility];  Service: General;  Laterality: Left;   INGUINAL HERNIA REPAIR Right 02/15/2021   Procedure: OPEN RIGHT INGUINAL HERNIA REPAIR WITH MESH;  Surgeon: Armandina Gemma, MD;  Location: [facility];  Service: General;  Laterality: Right;   INSERTION OF MESH Left 12/09/2012   Procedure: INSERTION OF MESH;  Surgeon: [name], MD;  Location: [facility];  Service: General;  Laterality: Left;   RIGHT/LEFT HEART CATH AND CORONARY ANGIOGRAPHY N/A 10/07/2017   Procedure: RIGHT/LEFT HEART CATH AND CORONARY ANGIOGRAPHY;  Surgeon:  [name], MD;  Location: [place] [facility];  Service: Cardiovascular;  Laterality: N/A;   RIGHT/LEFT HEART CATH AND CORONARY ANGIOGRAPHY N/A 11/16/2019   Procedure: RIGHT/LEFT HEART CATH AND CORONARY ANGIOGRAPHY;  Surgeon: Lorretta Harp, MD;  Location: [place] [facility];  Service: Cardiovascular;  Laterality: N/A;   TEE WITHOUT CARDIOVERSION N/A 11/22/2019   Procedure: TRANSESOPHAGEAL ECHOCARDIOGRAM (TEE);  Surgeon: [name], MD;  Location: [place];  Service: Open Heart Surgery;  Laterality: N/A;    Current Medications: Outpatient Medications Prior to Visit  Medication Sig Dispense Refill   apixaban (ELIQUIS) 2.5 MG TABS tablet TAKE 1 TABLET BY MOUTH  TWICE DAILY 180 tablet 1   clindamycin (CLEOCIN) 300 MG capsule Take 900 mg by mouth See admin instructions. Prior to dental work     docusate sodium (COLACE) 100 MG capsule Take 2 capsules (200 mg total) by mouth daily. 10 capsule 0   glipiZIDE (GLUCOTROL) 5 MG tablet Take 1 tablet (5 mg total) by mouth daily before breakfast. 30 tablet 0   levothyroxine (SYNTHROID) 75 MCG tablet Take 75 mcg by mouth daily before breakfast.     metolazone (ZAROXOLYN) 2.5 MG tablet Take 2.5 mg by mouth daily.     metoprolol succinate (TOPROL-XL) 25 MG 24 hr tablet TAKE ONE-HALF TABLET BY  MOUTH DAILY 45 tablet 3   Multiple Vitamin (MULTIVITAMIN WITH MINERALS) TABS tablet Take 1 tablet by mouth daily.     pantoprazole (PROTONIX) 40 MG tablet Take 1 tablet (40 mg total) by mouth daily. 30 tablet 0   Polyethyl Glycol-Propyl Glycol (SYSTANE) 0.4-0.3 % SOLN Place 1 drop into both eyes daily as needed (dry eyes).     pravastatin (PRAVACHOL) 40 MG tablet TAKE 1 TABLET BY MOUTH IN  THE EVENING 90 tablet 3   potassium chloride SA (KLOR-CON) 20 MEQ tablet Take 1 tablet (20 mEq total) by mouth daily. 90 tablet 3   torsemide (DEMADEX) 20 MG tablet Take 20 mg by mouth daily.     traMADol (ULTRAM) 50 MG tablet Take 1-2 tablets (50-100 mg total) by mouth  every 6 (six) hours as needed for moderate pain. (Patient not taking: Reported on 11/07/2021) 20 tablet 0   Multiple Vitamins-Minerals (PRESERVISION AREDS 2+MULTI VIT PO) Take 1 capsule by mouth in the morning and at bedtime.     No facility-administered medications prior to visit.     Allergies:   Patient has no known allergies.   Social History   Socioeconomic History   Marital status: Married    Spouse name: Not on file   Number of children: Not on file   Years of education: Not on file   Highest education level: Not on file  Occupational History   Not on file  Tobacco Use   Smoking status: Former    Types: Pipe, Cigars    Quit date: 12/03/1948    Years since quitting: 72.9   Smokeless tobacco: Current    Types: Chew   Tobacco comments:  has been chewing tobacco for 60-70 years  Vaping Use   Vaping Use: Never used  Substance and Sexual Activity   Alcohol use: No   Drug use: No   Sexual activity: Not on file  Other Topics Concern   Not on file  Social History Narrative   Not on file   Social Determinants of Health   Financial Resource Strain: Not on file  Food Insecurity: Not on file  Transportation Needs: Not on file  Physical Activity: Not on file  Stress: Not on file  Social Connections: Not on file     Family History:  The patient's family history includes Coronary artery disease in his father.   Review of Systems:    Please see the history of present illness.     All other systems reviewed and are otherwise negative except as noted above.   Physical Exam:    VS:  BP (!) 142/70   Pulse 77   Ht '5\' 10"'$  (1.778 m)   Wt 196 lb 12.8 oz (89.3 kg)   SpO2 95%   BMI 28.24 kg/m    General: Pleasant elderly male appearing in no acute distress. Head: Normocephalic, atraumatic. Neck: Bilateral carotid bruits. JVD not elevated.  Lungs: Respirations regular and unlabored, rales along left base.  Heart: Irregularly irregular. No S3 or S4. 2/6 SEM along RUSB.   Abdomen: Appears non-distended. No obvious abdominal masses. Msk:  Strength and tone appear normal for age. No obvious joint deformities or effusions. Extremities: No clubbing or cyanosis. 1+ pitting edema bilaterally.  Distal pedal pulses are 2+ bilaterally. Neuro: Alert and oriented X 3. Moves all extremities spontaneously. No focal deficits noted. Psych:  Responds to questions appropriately with a normal affect. Skin: No rashes or lesions noted  Wt Readings from Last 3 Encounters:  11/07/21 196 lb 12.8 oz (89.3 kg)  04/23/21 193 lb 6.4 oz (87.7 kg)  02/15/21 185 lb (83.9 kg)      Studies/Labs Reviewed:   EKG:  EKG is not ordered today.   Recent Labs: 04/23/2021: BUN 63; Creatinine, Ser 1.87; Hemoglobin 11.7; Platelets 159; Potassium 3.8; Sodium 140   Lipid Panel    Component Value Date/Time   CHOL 166 11/16/2019 0026   CHOL 179 05/05/2018 1001   TRIG 88 11/16/2019 0026   TRIG 134 05/05/2018 1001   HDL 47 11/16/2019 0026   HDL 54 05/05/2018 1001   CHOLHDL 3.5 11/16/2019 0026   VLDL 18 11/16/2019 0026   LDLCALC 101 (H) 11/16/2019 0026    Additional studies/ records that were reviewed today include:   R/LHC: 11/2019 Mid LAD to Dist LAD lesion is 20% stenosed. Ramus lesion is 95% stenosed. Ost LAD lesion is 99% stenosed. Ost LM to Mid LM lesion is 75% stenosed. Prox Cx to Mid Cx lesion is 50% stenosed. 2nd Diag lesion is 90% stenosed. Prox RCA lesion is 75% stenosed. Hemodynamic findings consistent with aortic valve stenosis. IMPRESSION: Mr. Bommarito has had progression of his CAD.  His mid LAD stent is patent.  He has left main/three-vessel disease.  I believe he has at least 75% left main with a 99% ostial LAD 95% proximal to mid ramus branch stenosis as well as progression of disease in his dominant RCA.  He has severe aortic stenosis.  I do not think he is a percutaneous revascularization candidate.  Mynx closure devices were used to hemostatically sealed the right  common femoral artery and venous puncture sites.  The patient left lab in stable  condition.  Given his left main, severe CAD and the fact that these had chest pain with non-STEMI I am upgrading him to [facility] for closer clinical observation.  Echo: 11/2020 IMPRESSIONS     1. Left ventricular ejection fraction, by estimation, is 55 to 60%. The  left ventricle has normal function. Left ventricular endocardial border  not optimally defined to evaluate regional wall motion. There is moderate  left ventricular hypertrophy. Left  ventricular diastolic parameters are consistent with Grade III diastolic  dysfunction (restrictive). Elevated left atrial pressure.   2. Grossly RV appears moderately enlarged with mildly decreased systolic  function. . Right ventricular systolic function was not well visualized.  The right ventricular size is not well visualized.   3. Left atrial size was severely dilated.   4. The mitral valve is normal in structure. Mild mitral valve  regurgitation. No evidence of mitral stenosis.   5. Tricuspid valve regurgitation is moderate.   6. 23 mm Inspiris Resilia tissue valve is in the aortic valve position. .  The aortic valve has been repaired/replaced. Aortic valve regurgitation is  mild.   7. The inferior vena cava is dilated in size with >50% respiratory  variability, suggesting right atrial pressure of 8 mmHg.   Carotid Dopplers: 11/2021 IMPRESSION: 1. Chronic occlusion of the left internal carotid and vertebral arteries. 2. Greater than 70% stenosis of the right internal carotid artery with similar peak systolic velocity compared to prior exam.  Assessment:    1. Coronary artery disease involving native coronary artery of native heart without angina pectoris   2. Chronic diastolic heart failure ([place])   3. Medication management   4. Typical atrial flutter ([place])   5. S/P AVR   6. Bilateral carotid artery disease, unspecified type ([place])   7. Hyperlipidemia LDL  goal <70      Plan:   In order of problems listed above:  1. CAD - He is s/p CABG on 11/22/2019 with LIMA-LAD, SVG-dRCA and seq SVG-RI-OM1. He denies any recent anginal symptoms. Continue Toprol-XL 12.5 mg daily and Pravastatin 40 mg daily. He is no longer on ASA given the need for anticoagulation.    2. HFpEF - He reports worsening lower extremity edema and orthopnea in the setting of a 5+ pound weight gain on his home scales. He is on Torsemide 20 mg daily along with Metolazone 2.5 mg daily. Given his symptoms and volume overload on examination, will titrate Torsemide to 40 mg daily for 5 days along with increasing K-dur to 30 mEq daily. They will send a message to report back on his weight and symptoms next week. Recheck BMET and BNP within the next 2 weeks. Creatinine was at 1.87 in 04/2021.  3. Persistent Atrial Flutter - He reports occasional palpitations and says his heart rate has been variable when checked at home. He is in atrial flutter by examination today but heart rate is well controlled in the 70's. Will continue Toprol-XL at 12.5 mg daily. Would not further titrate given his reports of intermittent bradycardia. - No reports of active bleeding. He remains on Eliquis 2.5 mg twice daily for anticoagulation which is the appropriate dose at this time given his age and renal function. Will recheck a CBC and BMET.   4. Severe AS  - He is s/p bioprosthetic AVR on 11/22/2019 and echocardiogram in 11/2020 showed normal valve function with only mild aortic valve regurgitation.  5. Carotid Artery Stenosis  - He has a known LICA occlusion and also  has significant stenosis along his RICA which was at least 70% by most recent carotid dopplers. He is followed by Vascular Surgery and anticipate repeat imaging next year. He remains on Eliquis and statin therapy.  6. HLD - Followed by his PCP. He remains on Pravastatin 40 mg daily with goal LDL less than 70 in the setting of known  CAD.     Medication Adjustments/Labs and Tests Ordered: Current medicines are reviewed at length with the patient today.  Concerns regarding medicines are outlined above.  Medication changes, Labs and Tests ordered today are listed in the Patient Instructions below. Patient Instructions  Medication Instructions:  Increase Torsemide to 40 mg for 5 days Increase Potassium to 30 mEq for 5 days  Labwork: In 2 weeks: CBC BMET BNP  Testing/Procedures: None  Follow-Up: Follow up with Dr. Domenic Polite or Bernerd Pho, PA-C in 3-4 months.  Any Other Special Instructions Will Be Listed Below (If Applicable).     If you need a refill on your cardiac medications before your next appointment, please call your pharmacy.    Signed, Erma Heritage, PA-C  11/07/2021 5:39 PM    [facility] [address] [place], [place] [address] Phone: [phone] Fax: [phone]

## 2021-11-07 NOTE — Patient Instructions (Signed)
Medication Instructions:  Increase Torsemide to 40 mg for 5 days Increase Potassium to 30 mEq for 5 days  Labwork: In 2 weeks: CBC BMET BNP  Testing/Procedures: None  Follow-Up: Follow up with Dr. Domenic Polite or Bernerd Pho, PA-C in 3-4 months.  Any Other Special Instructions Will Be Listed Below (If Applicable).     If you need a refill on your cardiac medications before your next appointment, please call your pharmacy.

## 2021-11-16 ENCOUNTER — Other Ambulatory Visit: Payer: Self-pay

## 2021-11-16 DIAGNOSIS — Z79899 Other long term (current) drug therapy: Secondary | ICD-10-CM | POA: Diagnosis not present

## 2021-11-17 LAB — CBC
Hematocrit: 33.8 % — ABNORMAL LOW (ref 37.5–51.0)
Hemoglobin: 12.1 g/dL — ABNORMAL LOW (ref 13.0–17.7)
MCH: 34.7 pg — ABNORMAL HIGH (ref 26.6–33.0)
MCHC: 35.8 g/dL — ABNORMAL HIGH (ref 31.5–35.7)
MCV: 97 fL (ref 79–97)
Platelets: 156 10*3/uL (ref 150–450)
RBC: 3.49 x10E6/uL — ABNORMAL LOW (ref 4.14–5.80)
RDW: 13.1 % (ref 11.6–15.4)
WBC: 6 10*3/uL (ref 3.4–10.8)

## 2021-11-17 LAB — BASIC METABOLIC PANEL
BUN/Creatinine Ratio: 32 — ABNORMAL HIGH (ref 10–24)
BUN: 65 mg/dL — ABNORMAL HIGH (ref 10–36)
CO2: 27 mmol/L (ref 20–29)
Calcium: 9.4 mg/dL (ref 8.6–10.2)
Chloride: 91 mmol/L — ABNORMAL LOW (ref 96–106)
Creatinine, Ser: 2.02 mg/dL — ABNORMAL HIGH (ref 0.76–1.27)
Glucose: 197 mg/dL — ABNORMAL HIGH (ref 70–99)
Potassium: 3.4 mmol/L — ABNORMAL LOW (ref 3.5–5.2)
Sodium: 137 mmol/L (ref 134–144)
eGFR: 30 mL/min/{1.73_m2} — ABNORMAL LOW (ref >59)

## 2021-11-17 LAB — BRAIN NATRIURETIC PEPTIDE: BNP: 455.3 pg/mL — ABNORMAL HIGH (ref 0.0–100.0)

## 2021-11-19 ENCOUNTER — Telehealth: Payer: Self-pay | Admitting: *Deleted

## 2021-11-19 DIAGNOSIS — Z79899 Other long term (current) drug therapy: Secondary | ICD-10-CM

## 2021-11-19 NOTE — Telephone Encounter (Signed)
-----   Message from Erma Heritage, [place] sent at 11/17/2021  4:43 PM EDT ----- Please let the patient and his daughter know his hemoglobin is at 12.1 which has improved from prior values. Platelet count is normal. Fluid level remains somewhat elevated but no recent values for comparison. His creatinine (kidney function) has worsened with the higher diuretic dose so would switch to doing Torsemide '40mg'$  daily alternating with '20mg'$  daily. Continue with K-dur 30 mEq daily as his K+ was slightly low but this should improve with the dose change of Torsemide. Please confirm if he is taking Metolazone daily or not as that was in his medication list but was listed as a "historical medication". Repeat BMET in 7-10 days for reassessment of kidney function and electrolytes.

## 2021-11-19 NOTE — Telephone Encounter (Signed)
Pt's daughter notified.

## 2021-11-26 DIAGNOSIS — Z79899 Other long term (current) drug therapy: Secondary | ICD-10-CM | POA: Diagnosis not present

## 2021-11-27 LAB — BASIC METABOLIC PANEL
BUN/Creatinine Ratio: 31 — ABNORMAL HIGH (ref 10–24)
BUN: 60 mg/dL — ABNORMAL HIGH (ref 10–36)
CO2: 24 mmol/L (ref 20–29)
Calcium: 9.5 mg/dL (ref 8.6–10.2)
Chloride: 94 mmol/L — ABNORMAL LOW (ref 96–106)
Creatinine, Ser: 1.94 mg/dL — ABNORMAL HIGH (ref 0.76–1.27)
Glucose: 115 mg/dL — ABNORMAL HIGH (ref 70–99)
Potassium: 3.5 mmol/L (ref 3.5–5.2)
Sodium: 139 mmol/L (ref 134–144)
eGFR: 32 mL/min/{1.73_m2} — ABNORMAL LOW (ref >59)

## 2021-12-13 DIAGNOSIS — E039 Hypothyroidism, unspecified: Secondary | ICD-10-CM | POA: Diagnosis not present

## 2021-12-15 ENCOUNTER — Other Ambulatory Visit: Payer: Self-pay | Admitting: Cardiology

## 2021-12-15 DIAGNOSIS — I483 Typical atrial flutter: Secondary | ICD-10-CM

## 2021-12-17 NOTE — Telephone Encounter (Signed)
Prescription refill request for Eliquis received. Indication: Atrial Flutter Last office visit: 11/07/21  B Strader PA-C Scr: 1.94 on 11/26/21 Age: 88 Weight: 89.3kg  Based on above findings Eliquis 2.'5mg'$  twice daily is the appropriate dose.  Refill approved.

## 2022-02-07 ENCOUNTER — Telehealth: Payer: Self-pay | Admitting: *Deleted

## 2022-02-07 NOTE — Patient Outreach (Signed)
  Care Coordination   02/07/2022 Name: William Walker MRN: [id number] DOB: July 11, 1926   Care Coordination Outreach Attempts:  An unsuccessful telephone outreach was attempted today to offer the patient information about available care coordination services as a benefit of their health plan.   Follow Up Plan:  Additional outreach attempts will be made to offer the patient care coordination information and services.   Encounter Outcome:  No Answer  Care Coordination Interventions Activated:  No   Care Coordination Interventions:  No, not indicated    Chong Sicilian, BSN, RN-BC [place] / [facility] Pharmacist, community Dial: [phone]

## 2022-02-12 ENCOUNTER — Telehealth: Payer: Self-pay | Admitting: *Deleted

## 2022-02-12 NOTE — Patient Outreach (Signed)
  Care Coordination   02/12/2022 Name: William Walker MRN: [id number] DOB: 11-06-1926   Care Coordination Outreach Attempts:  A second unsuccessful outreach was attempted today to offer the patient with information about available care coordination services as a benefit of their health plan.     Follow Up Plan:  Additional outreach attempts will be made to offer the patient care coordination information and services.   Encounter Outcome:  No Answer  Care Coordination Interventions Activated:  No   Care Coordination Interventions:  No, not indicated    Chong Sicilian, BSN, RN-BC RN Care Coordinator [place]: [phone] Main #: [phone]

## 2022-02-19 ENCOUNTER — Telehealth: Payer: Self-pay | Admitting: *Deleted

## 2022-02-19 ENCOUNTER — Encounter: Payer: Self-pay | Admitting: *Deleted

## 2022-02-19 NOTE — Patient Outreach (Signed)
  Care Coordination   Initial Visit Note   02/19/2022 Name: William Walker MRN: [id number] DOB: 10-Apr-1927  William Walker is a 88 y.o. year old male who sees Redmond School, MD for primary care. I  Spoke with daughter, William Walker, by telephone.  What matters to the patients health and wellness today?  Ongoing self management of chronic medical conditions    Goals Addressed             This Visit's Progress    Care Coordination Services (no follow-up required)       Care Coordination Interventions: Reviewed medications with patient and discussed affordability. Patient is taking Eliquis and 3 month supply is > $500, but he gets reimbursed by his secondary insurance company (retiree benefits) Assessed social determinant of health barriers Assessed mobility and ability to perform ADLs Assessed family/Social support Provided patient/caregiver with verbal information on [place] [phone]) Encouraged patient to request a referral for [place] from PCP if services are needed in the future         SDOH assessments and interventions completed:  Yes  SDOH Interventions Today    Flowsheet Row Most Recent Value  SDOH Interventions   Housing Interventions Intervention Not Indicated  Transportation Interventions Intervention Not Indicated  Utilities Interventions Intervention Not Indicated  Financial Strain Interventions Intervention Not Indicated        Care Coordination Interventions Activated:  Yes  Care Coordination Interventions:  Yes, provided   Follow up plan: No further intervention required.   Encounter Outcome:  Pt. Visit Completed   Chong Sicilian, BSN, RN-BC RN Care Coordinator [place] Direct Dial: [phone] Main #: [phone]

## 2022-02-20 ENCOUNTER — Ambulatory Visit: Payer: Medicare Other | Attending: Student | Admitting: Student

## 2022-02-20 ENCOUNTER — Other Ambulatory Visit (HOSPITAL_COMMUNITY)
Admission: RE | Admit: 2022-02-20 | Discharge: 2022-02-20 | Disposition: A | Discharge status: Home / Self Care | Payer: Medicare Other | Source: Ambulatory Visit | Attending: Student | Admitting: Student

## 2022-02-20 ENCOUNTER — Encounter: Payer: Self-pay | Admitting: Student

## 2022-02-20 VITALS — BP 124/60 | HR 66 | Ht 71.0 in | Wt 186.0 lb

## 2022-02-20 DIAGNOSIS — E785 Hyperlipidemia, unspecified: Secondary | ICD-10-CM

## 2022-02-20 DIAGNOSIS — I2510 Atherosclerotic heart disease of native coronary artery without angina pectoris: Secondary | ICD-10-CM

## 2022-02-20 DIAGNOSIS — I5032 Chronic diastolic (congestive) heart failure: Secondary | ICD-10-CM

## 2022-02-20 DIAGNOSIS — I779 Disorder of arteries and arterioles, unspecified: Secondary | ICD-10-CM

## 2022-02-20 DIAGNOSIS — Z79899 Other long term (current) drug therapy: Secondary | ICD-10-CM | POA: Diagnosis not present

## 2022-02-20 DIAGNOSIS — I483 Typical atrial flutter: Secondary | ICD-10-CM

## 2022-02-20 DIAGNOSIS — Z952 Presence of prosthetic heart valve: Secondary | ICD-10-CM | POA: Diagnosis not present

## 2022-02-20 LAB — BASIC METABOLIC PANEL
Anion gap: 11 (ref 5–15)
BUN: 50 mg/dL — ABNORMAL HIGH (ref 8–23)
CO2: 31 mmol/L (ref 22–32)
Calcium: 9.5 mg/dL (ref 8.9–10.3)
Chloride: 94 mmol/L — ABNORMAL LOW (ref 98–111)
Creatinine, Ser: 1.62 mg/dL — ABNORMAL HIGH (ref 0.61–1.24)
GFR, Estimated: 39 mL/min — ABNORMAL LOW (ref >60)
Glucose, Bld: 141 mg/dL — ABNORMAL HIGH (ref 70–99)
Potassium: 3.6 mmol/L (ref 3.5–5.1)
Sodium: 136 mmol/L (ref 135–145)

## 2022-02-20 LAB — CBC
HCT: 38.6 % — ABNORMAL LOW (ref 39.0–52.0)
Hemoglobin: 13.1 g/dL (ref 13.0–17.0)
MCH: 34.9 pg — ABNORMAL HIGH (ref 26.0–34.0)
MCHC: 33.9 g/dL (ref 30.0–36.0)
MCV: 102.9 fL — ABNORMAL HIGH (ref 80.0–100.0)
Platelets: 197 10*3/uL (ref 150–400)
RBC: 3.75 MIL/uL — ABNORMAL LOW (ref 4.22–5.81)
RDW: 14 % (ref 11.5–15.5)
WBC: 7.1 10*3/uL (ref 4.0–10.5)
nRBC: 0 % (ref 0.0–0.2)

## 2022-02-20 MED ORDER — TORSEMIDE 20 MG PO TABS: ORAL_TABLET | ORAL | 0 refills | Status: DC

## 2022-02-20 NOTE — Patient Instructions (Signed)
Medication Instructions:  Your physician recommends that you continue on your current medications as directed. Please refer to the Current Medication list given to you today.  You have been given samples of Eliquis 2.5 mg Today. QPR#[id number], Exp: 09/2022.   *If you need a refill on your cardiac medications before your next appointment, please call your pharmacy*   Lab Work: Your physician recommends that you return for lab work in: Today ( BMET, CBC)   If you have labs (blood work) drawn today and your tests are completely normal, you will receive your results only by: MyChart Message (if you have MyChart) OR A paper copy in the mail If you have any lab test that is abnormal or we need to change your treatment, we will call you to review the results.   Testing/Procedures: NONE    Follow-Up: At [facility], you and your health needs are our priority.  As part of our continuing mission to provide you with exceptional heart care, we have created designated Provider Care Teams.  These Care Teams include your primary Cardiologist (physician) and Advanced Practice Providers (APPs -  Physician Assistants and Nurse Practitioners) who all work together to provide you with the care you need, when you need it.  We recommend signing up for the patient portal called "MyChart".  Sign up information is provided on this After Visit Summary.  MyChart is used to connect with patients for Virtual Visits (Telemedicine).  Patients are able to view lab/test results, encounter notes, upcoming appointments, etc.  Non-urgent messages can be sent to your provider as well.   To learn more about what you can do with MyChart, go to [email or web address].    Your next appointment:   4 -5 month(s)  The format for your next appointment:   In Person  Provider:   [name], MD    Other Instructions Thank you for choosing [facility]!    Important Information About  [place]

## 2022-02-20 NOTE — Progress Notes (Addendum)
Cardiology Office Note    Date:  02/20/2022   ID:  ISHMAIL MCMANAMON, DOB [birth year], MRN [id number]  PCP:  Redmond School, MD  Cardiologist: [name], MD    Chief Complaint  Patient presents with   Follow-up    3 month visit    History of Present Illness:    William Walker is a 88 y.o. male with past medical history of CAD (s/p prior stenting of LAD, cath in 11/2019 showing LM and 3-vessel CAD--> s/p CABG on 11/22/2019 with LIMA-LAD, SVG-dRCA and seq SVG-RI-OM1 and complicated by post-cardiotomy shock), HFpEF, persistent atrial flutter, severe AS (s/p bioprosthetic AVR on 11/22/2019), carotid artery stenosis (known LICA occlusion), HTN, HLD and hypothyroidism who presents to the office today for 67-monthfollow-up.   He was examined by myself in 11/2021 and denied any recent chest pain or dyspnea on exertion but did report worsening lower extremity edema and orthopnea in the setting of a 5 pound weight gain. He was currently taking Torsemide 20 mg daily along with Metolazone 2.5 mg daily and it was recommended to increase Torsemide to 40 mg daily for 5 days then resume prior dosing if weight returned to baseline. Follow-up labs did show his creatinine had increased to 2.02 (previously 1.87 in 04/2021), therefore Torsemide was reduced to 40 mg daily alternating with 20 mg daily. Repeat labs were obtained later that month and creatinine had improved to 1.94 following the dose adjustment.  In talking with the patient and his son today, he reports things have overall been stable from a cardiac perspective since his last visit. Says his breathing has been stable with no specific orthopnea, PND or pitting edema. No recent exertional chest pain or palpitations. Still enjoys mowing his yard with the riding mower and using his tractor. He remains on Eliquis for anticoagulation with no reported melena, hematochezia or hematuria. While his weight is at 186 lbs on the office scales today, he says it has  been in the 170's when checked at home. He reports having a good appetite and also tries to stay hydrated.   Past Medical History:  Diagnosis Date   Aortic stenosis    a. s/p AVR in 11/2019   Arthritis    Coronary atherosclerosis of native coronary artery    a. s/p prior stenting of LAD b. cath in 11/2019 showing LM and 3-vessel CAD--> s/p CABG on 11/22/2019 with LIMA-LAD, SVG-dRCA and seq SVG-RI-OM1 and complicated by post-cardiotomy shock   CVD (cerebrovascular disease)    Essential hypertension    GERD (gastroesophageal reflux disease)    History of kidney stones    Hyperlipidemia    MI (myocardial infarction) ([facility]    October 1999   Skin cancer    Type 2 diabetes mellitus with diabetic neuropathy ([facility]     Past Surgical History:  Procedure Laterality Date   AORTIC VALVE REPLACEMENT N/A 11/22/2019   Procedure: AORTIC VALVE REPLACEMENT (AVR) USING INSPIRIS 23MM;  Surgeon: GGrace Isaac MD;  [facility]  Service: Open Heart Surgery;  Laterality: N/A;   Arch cerebral ateriogram  12/11/2005   TArvilla MeresEarly MD   CATARACT EXTRACTION, BILATERAL     CORONARY ARTERY BYPASS GRAFT N/A 11/22/2019   Procedure: CORONARY ARTERY BYPASS GRAFTING (CABG), ON PUMP, TIMES FOUR , USING LEFT INTERNAL MAMMARY ARTERY AND ENDOSCOPICALLY HARVESTED RIGHT GREATER SAPHENOUS VEIN;  Surgeon: GGrace Isaac MD;  [facility]  Service: Open Heart Surgery;  Laterality: N/A;  SEQUENTIAL RAMUS INTERMEDIATE  TO OM1 SAPHENOUS VEIN TO DISTAL PDA LIMA TO LAD   INGUINAL HERNIA REPAIR Left 12/09/2012   Procedure: HERNIA REPAIR INGUINAL with mesh;  Surgeon: [name], MD;  Location: [facility];  Service: General;  Laterality: Left;   INGUINAL HERNIA REPAIR Right 02/15/2021   Procedure: OPEN RIGHT INGUINAL HERNIA REPAIR WITH MESH;  Surgeon: Armandina Gemma, MD;  Location: [facility];  Service: General;  Laterality: Right;   INSERTION OF MESH Left 12/09/2012   Procedure: INSERTION OF MESH;  Surgeon: [name],  MD;  Location: [facility];  Service: General;  Laterality: Left;   RIGHT/LEFT HEART CATH AND CORONARY ANGIOGRAPHY N/A 10/07/2017   Procedure: RIGHT/LEFT HEART CATH AND CORONARY ANGIOGRAPHY;  Surgeon: [name], MD;  Location: [facility];  Service: Cardiovascular;  Laterality: N/A;   RIGHT/LEFT HEART CATH AND CORONARY ANGIOGRAPHY N/A 11/16/2019   Procedure: RIGHT/LEFT HEART CATH AND CORONARY ANGIOGRAPHY;  Surgeon: Lorretta Harp, MD;  Location: [facility];  Service: Cardiovascular;  Laterality: N/A;   TEE WITHOUT CARDIOVERSION N/A 11/22/2019   Procedure: TRANSESOPHAGEAL ECHOCARDIOGRAM (TEE);  Surgeon: [name], MD;  Location: [facility];  Service: Open Heart Surgery;  Laterality: N/A;    Current Medications: Outpatient Medications Prior to Visit  Medication Sig Dispense Refill   apixaban (ELIQUIS) 2.5 MG TABS tablet TAKE 1 TABLET BY MOUTH TWICE  DAILY 180 tablet 1   clindamycin (CLEOCIN) 300 MG capsule Take 900 mg by mouth See admin instructions. Prior to dental work     glipiZIDE (GLUCOTROL) 5 MG tablet Take 1 tablet (5 mg total) by mouth daily before breakfast. 30 tablet 0   levothyroxine (SYNTHROID) 75 MCG tablet Take 75 mcg by mouth daily before breakfast.     metolazone (ZAROXOLYN) 2.5 MG tablet Take 2.5 mg by mouth daily.     metoprolol succinate (TOPROL-XL) 25 MG 24 hr tablet TAKE ONE-HALF TABLET BY  MOUTH DAILY 45 tablet 3   Multiple Vitamin (MULTIVITAMIN WITH MINERALS) TABS tablet Take 1 tablet by mouth daily.     pantoprazole (PROTONIX) 40 MG tablet Take 1 tablet (40 mg total) by mouth daily. 30 tablet 0   Polyethyl Glycol-Propyl Glycol (SYSTANE) 0.4-0.3 % SOLN Place 1 drop into both eyes daily as needed (dry eyes).     potassium chloride SA (KLOR-CON M) 20 MEQ tablet Take 1.5 tablets (30 mEq total) by mouth daily for 5 days, THEN 1 tablet (20 mEq total) daily. 90 tablet 3   pravastatin (PRAVACHOL) 40 MG tablet TAKE 1 TABLET BY MOUTH IN  THE EVENING 90 tablet 3    torsemide (DEMADEX) 20 MG tablet Take 2 tablets (40 mg total) by mouth daily for 5 days, THEN 1 tablet (20 mg total) daily. 370 tablet 0   docusate sodium (COLACE) 100 MG capsule Take 2 capsules (200 mg total) by mouth daily. 10 capsule 0   No facility-administered medications prior to visit.     Allergies:   Patient has no known allergies.   Social History   Socioeconomic History   Marital status: Married    Spouse name: Not on file   Number of children: Not on file   Years of education: Not on file   Highest education level: Not on file  Occupational History   Not on file  Tobacco Use   Smoking status: Former    Types: Pipe, Cigars    Quit date: 12/03/1948    Years since quitting: 73.2   Smokeless tobacco: Current  Types: Chew   Tobacco comments:    has been chewing tobacco for 60-70 years  Vaping Use   Vaping Use: Never used  Substance and Sexual Activity   Alcohol use: No   Drug use: No   Sexual activity: Not on file  Other Topics Concern   Not on file  Social History Narrative   Not on file   Social Determinants of Health   Financial Resource Strain: Low Risk  (02/19/2022)   Overall Financial Resource Strain (CARDIA)    Difficulty of Paying Living Expenses: Not very hard  Food Insecurity: Not on file  Transportation Needs: No Transportation Needs (02/19/2022)   PRAPARE - Hydrologist (Medical): No    Lack of Transportation (Non-Medical): No  Physical Activity: Not on file  Stress: Not on file  Social Connections: Not on file     Family History:  The patient's family history includes Coronary artery disease in his father.   Review of Systems:    Please see the history of present illness.     All other systems reviewed and are otherwise negative except as noted above.   Physical Exam:    VS:  BP 124/60   Pulse 66   Ht '5\' 11"'$  (1.803 m)   Wt 186 lb (84.4 kg)   SpO2 98%   BMI 25.94 kg/m    General: Pleasant elderly male  appearing in no acute distress. Head: Normocephalic, atraumatic. Neck: No carotid bruits. JVD not elevated.  Lungs: Respirations regular and unlabored, without wheezes or rales.  Heart: Irregularly irregular. No S3 or S4.  2/6 SEM along RUSB.  Abdomen: Appears non-distended. No obvious abdominal masses. Msk:  Strength and tone appear normal for age. No obvious joint deformities or effusions. Extremities: No clubbing or cyanosis. No pitting edema.  Distal pedal pulses are 2+ bilaterally. Neuro: Alert and oriented X 3. Moves all extremities spontaneously. No focal deficits noted. Psych:  Responds to questions appropriately with a normal affect. Skin: No rashes or lesions noted  Wt Readings from Last 3 Encounters:  02/20/22 186 lb (84.4 kg)  11/07/21 196 lb 12.8 oz (89.3 kg)  04/23/21 193 lb 6.4 oz (87.7 kg)     Studies/Labs Reviewed:   EKG:  EKG is ordered today. The ekg ordered today demonstrates rate-controlled atrial fibrillation, HR 66 with RBBB.   Recent Labs: 11/16/2021: BNP 455.3 02/20/2022: BUN 50; Creatinine, Ser 1.62; Hemoglobin 13.1; Platelets 197; Potassium 3.6; Sodium 136   Lipid Panel    Component Value Date/Time   CHOL 166 11/16/2019 0026   CHOL 179 05/05/2018 1001   TRIG 88 11/16/2019 0026   TRIG 134 05/05/2018 1001   HDL 47 11/16/2019 0026   HDL 54 05/05/2018 1001   CHOLHDL 3.5 11/16/2019 0026   VLDL 18 11/16/2019 0026   LDLCALC 101 (H) 11/16/2019 0026    Additional studies/ records that were reviewed today include:   R/LHC: 11/2019 Mid LAD to Dist LAD lesion is 20% stenosed. Ramus lesion is 95% stenosed. Ost LAD lesion is 99% stenosed. Ost LM to Mid LM lesion is 75% stenosed. Prox Cx to Mid Cx lesion is 50% stenosed. 2nd Diag lesion is 90% stenosed. Prox RCA lesion is 75% stenosed. Hemodynamic findings consistent with aortic valve stenosis. IMPRESSION: Mr. Mundis has had progression of his CAD.  His mid LAD stent is patent.  He has left main/three-vessel  disease.  I believe he has at least 75% left main with a 99% ostial  LAD 95% proximal to mid ramus branch stenosis as well as progression of disease in his dominant RCA.  He has severe aortic stenosis.  I do not think he is a percutaneous revascularization candidate.  Mynx closure devices were used to hemostatically sealed the right common femoral artery and venous puncture sites.  The patient left lab in stable condition.  Given his left main, severe CAD and the fact that these had chest pain with non-STEMI I am upgrading him to [facility] for closer clinical observation.  Echocardiogram: 11/2020 IMPRESSIONS     1. Left ventricular ejection fraction, by estimation, is 55 to 60%. The  left ventricle has normal function. Left ventricular endocardial border  not optimally defined to evaluate regional wall motion. There is moderate  left ventricular hypertrophy. Left  ventricular diastolic parameters are consistent with Grade III diastolic  dysfunction (restrictive). Elevated left atrial pressure.   2. Grossly RV appears moderately enlarged with mildly decreased systolic  function. . Right ventricular systolic function was not well visualized.  The right ventricular size is not well visualized.   3. Left atrial size was severely dilated.   4. The mitral valve is normal in structure. Mild mitral valve  regurgitation. No evidence of mitral stenosis.   5. Tricuspid valve regurgitation is moderate.   6. 23 mm Inspiris Resilia tissue valve is in the aortic valve position. .  The aortic valve has been repaired/replaced. Aortic valve regurgitation is  mild.   7. The inferior vena cava is dilated in size with >50% respiratory  variability, suggesting right atrial pressure of 8 mmHg.   Carotid Dopplers: 11/2021 IMPRESSION: 1. Chronic occlusion of the left internal carotid and vertebral arteries. 2. Greater than 70% stenosis of the right internal carotid artery with similar peak systolic velocity compared to  prior exam.  Assessment:    1. Coronary artery disease involving native coronary artery of native heart without angina pectoris   2. Chronic diastolic heart failure ([place])   3. Typical atrial flutter ([place])   4. S/P AVR   5. Medication management   6. Hyperlipidemia LDL goal <70   7. Bilateral carotid artery disease, unspecified type ([place])      Plan:   In order of problems listed above:  1. CAD - He underwent CABG in 11/2019 with LIMA-LAD, SVG-dRCA and seq SVG-RI-OM1. No recent anginal symptoms.  - Continue Toprol-XL 12.'5mg'$  daily and Pravastatin '40mg'$  daily. No longer on ASA given the need for Eliquis.   2. HFpEF - Echocardiogram in 11/2020 showed his EF had improved to 55 to 60% but was noted to have grade 3 diastolic dysfunction and RV function was mildly reduced. He appears euvolemic on examination today and his weight has been stable on his home scales. Continue current diuretic therapy with Metolazone 2.5 mg daily and Torsemide 20 mg daily alternating with 40 mg daily. Will recheck a BMET.   3. Persistent Atrial Flutter - He denies any recent palpitations and heart rate is well controlled in the 60's today. Will continue Toprol-XL 12.5 mg daily. He reports his heart rate is intermittently in the 50's at home and I encouraged him to make [place] aware if this becomes a more persistent issue as we may need to stop Toprol-XL altogether. - Continue Eliquis 2.5 mg twice daily for anticoagulation. Will recheck labs today to include a CBC and BMET. He has been on the reduced dose of Eliquis given his age and renal function.   4. Aortic Stenosis - He underwent  bioprosthetic AVR in 11/2019. Echocardiogram in 11/2020 showed his aortic valve was functioning normally with only mild regurgitation.  5. HLD - Followed by his PCP and we will request a copy of his most recent FLP. He remains on Pravastatin 40 mg daily.  6. Carotid Artery Stenosis - Followed by Vascular Surgery and dopplers in 11/2021  showed the known chronic occlusion along his LICA and greater than 70% stenosis along the RICA.   Medication Adjustments/Labs and Tests Ordered: Current medicines are reviewed at length with the patient today.  Concerns regarding medicines are outlined above.  Medication changes, Labs and Tests ordered today are listed in the Patient Instructions below. Patient Instructions  Medication Instructions:  Your physician recommends that you continue on your current medications as directed. Please refer to the Current Medication list given to you today.  You have been given samples of Eliquis 2.5 mg Today. [id number], Exp: 09/2022.   *If you need a refill on your cardiac medications before your next appointment, please call your pharmacy*   Lab Work: Your physician recommends that you return for lab work in: Today ( BMET, CBC)   If you have labs (blood work) drawn today and your tests are completely normal, you will receive your results only by: MyChart Message (if you have MyChart) OR A paper copy in the mail If you have any lab test that is abnormal or we need to change your treatment, we will call you to review the results.   Testing/Procedures: NONE    Follow-Up: At [facility], you and your health needs are our priority.  As part of our continuing mission to provide you with exceptional heart care, we have created designated Provider Care Teams.  These Care Teams include your primary Cardiologist (physician) and Advanced Practice Providers (APPs -  Physician Assistants and Nurse Practitioners) who all work together to provide you with the care you need, when you need it.  We recommend signing up for the patient portal called "MyChart".  Sign up information is provided on this After Visit Summary.  MyChart is used to connect with patients for Virtual Visits (Telemedicine).  Patients are able to view lab/test results, encounter notes, upcoming appointments, etc.  Non-urgent  messages can be sent to your provider as well.   To learn more about what you can do with MyChart, go to [email or web address].    Your next appointment:   4 -5 month(s)  The format for your next appointment:   In Person  Provider:   [name], MD    Other Instructions Thank you for choosing [facility]!    Important Information About Sugar         Signed, Erma Heritage, PA-C  02/20/2022 4:34 PM    [facility] [address] [place], [place] [address] Phone: [phone] Fax: [phone]

## 2022-03-18 ENCOUNTER — Other Ambulatory Visit: Payer: Self-pay

## 2022-03-18 MED ORDER — POTASSIUM CHLORIDE CRYS ER 20 MEQ PO TBCR: 30.0000 meq | EXTENDED_RELEASE_TABLET | Freq: Every day | ORAL | 3 refills | Status: DC

## 2022-06-13 ENCOUNTER — Other Ambulatory Visit: Payer: Self-pay | Admitting: Cardiology

## 2022-06-13 DIAGNOSIS — I483 Typical atrial flutter: Secondary | ICD-10-CM

## 2022-06-14 NOTE — Telephone Encounter (Signed)
Prescription refill request for Eliquis received. Indication: Atrial Flutter  Last office visit: 02/20/22 Ahmed Prima)  Scr: 1.62 (02/20/22)  Age: 89 Weight: 84.4kg  Appropriate dose and refill sent to requested pharmacy.

## 2022-07-11 ENCOUNTER — Other Ambulatory Visit: Payer: Self-pay | Admitting: Student

## 2022-08-16 ENCOUNTER — Ambulatory Visit: Payer: Medicare Other | Attending: Cardiology | Admitting: Cardiology

## 2022-08-16 ENCOUNTER — Encounter: Payer: Self-pay | Admitting: Cardiology

## 2022-08-16 VITALS — BP 150/72 | HR 68 | Ht 70.0 in | Wt 196.4 lb

## 2022-08-16 DIAGNOSIS — I25119 Atherosclerotic heart disease of native coronary artery with unspecified angina pectoris: Secondary | ICD-10-CM

## 2022-08-16 DIAGNOSIS — Z953 Presence of xenogenic heart valve: Secondary | ICD-10-CM

## 2022-08-16 DIAGNOSIS — I5032 Chronic diastolic (congestive) heart failure: Secondary | ICD-10-CM

## 2022-08-16 DIAGNOSIS — I779 Disorder of arteries and arterioles, unspecified: Secondary | ICD-10-CM

## 2022-08-16 DIAGNOSIS — I6523 Occlusion and stenosis of bilateral carotid arteries: Secondary | ICD-10-CM | POA: Diagnosis not present

## 2022-08-16 NOTE — Patient Instructions (Signed)
Medication Instructions:   Your physician recommends that you continue on your current medications as directed. Please refer to the Current Medication list given to you today.  Labwork: None today  Testing/Procedures: Your physician has requested that you have a carotid duplex IN 6 MONTHS. This test is an ultrasound of the carotid arteries in your neck. It looks at blood flow through these arteries that supply the brain with blood. Allow one hour for this exam. There are no restrictions or special instructions.   Follow-Up: 6 months after Ultrasound  Any Other Special Instructions Will Be Listed Below (If Applicable).  If you need a refill on your cardiac medications before your next appointment, please call your pharmacy.

## 2022-08-16 NOTE — Progress Notes (Signed)
Cardiology Office Note  Date: 08/16/2022   ID: William Walker, DOB [birth year], MRN [id number]  History of Present Illness: William Walker is a 89 y.o. male last seen in September 2023 by Ms. Strader PA-C, I reviewed the note.  He is here today with his daughter for follow-up visit.  Overall doing reasonably well.  He does not describe any angina, has had fluctuating leg edema and his weight is up compared to last visit, but he does not have the degree of fluid retention that he has had previously.  We discussed self adjusting Demadex based on leg swelling and weight.  He also uses potassium supplement and Zaroxolyn as before.  He does not report any spontaneous bleeding problems on Eliquis.  Heart rate controlled on Toprol-XL and he does not voice any sense of palpitations.  He has significant carotid artery disease as noted below, we plan on follow-up carotid Dopplers for his next visit in 6 months.  He has seen Dr. Donnetta Hutching and is being managed medically at this time.  Continues on Pravachol with last LDL 71.  He will be having lab work with PCP in the near future.  Physical Exam: VS:  BP (!) 150/72   Pulse 68   Ht 5\' 10"  (1.778 m)   Wt 196 lb 6.4 oz (89.1 kg)   SpO2 98%   BMI 28.18 kg/m , BMI Body mass index is 28.18 kg/m.  Wt Readings from Last 3 Encounters:  08/16/22 196 lb 6.4 oz (89.1 kg)  02/20/22 186 lb (84.4 kg)  11/07/21 196 lb 12.8 oz (89.3 kg)    General: Patient appears comfortable at rest. HEENT: Conjunctiva and lids normal. Neck: Supple, no elevated JVP, carotid bruits. Lungs: Clear to auscultation, nonlabored breathing at rest. Cardiac: Regular rate and rhythm, no S3, 2/6 systolic murmur. Extremities: 2+ lower leg edema.  ECG:  An ECG dated 02/20/2022 was personally reviewed today and demonstrated:  Atypical atrial flutter with 2:1 block, IVCD and repolarization abnormalities .  Labwork: March 2022: Cholesterol 143, triglycerides 123, HDL 50, LDL 71 11/16/2021:  BNP 455.3 02/20/2022: BUN 50; Creatinine, Ser 1.62; Hemoglobin 13.1; Platelets 197; Potassium 3.6; Sodium 136   Other Studies Reviewed Today:  No interval cardiac testing for review today.  Assessment and Plan:  1.  Multivessel CAD with remote stent intervention to the LAD and more recently CABG in June 2021 with LIMA to LAD, SVG to RCA, and SVG to ramus intermedius and first obtuse marginal.  He reports no active angina at this time.  Not on aspirin given use of Eliquis.  Continue Pravachol.  2.  History of severe degenerative calcific aortic stenosis status post 23 mm Inspiris bioprosthetic AVR at the time of CABG in June 2021.  Echocardiogram in June 2022 revealed mean AV gradient 6 mmHg with mild aortic regurgitation.  No indication for reimaging at this time.  3.  Persistent typical atrial flutter with CHA2DS2-VASc score of 6.  Asymptomatic in terms of palpitations and heart rate controlled on Toprol-XL.  Continue Eliquis at present dose.  4.  HFpEF, LVEF 55 to 60% with restrictive diastolic filling pattern and mildly decreased RV contraction by echocardiogram in June 2022.  Continue Demadex with potassium supplement along with metolazone.  5.  CKD stage IIIb.  Last creatinine 1.62.  He will have repeat lab work this year with PCP.  6.  Asymptomatic carotid artery disease, occluded LICA and greater than 70% RICA by carotid Dopplers in June 2023.  He has followed with Dr. Donnetta Hutching.  Repeat carotid Dopplers for 52-month visit.  Disposition:  Follow up  6 months.  Signed, Satira Sark, M.D., F.A.C.C.

## 2022-09-04 ENCOUNTER — Other Ambulatory Visit (HOSPITAL_COMMUNITY): Payer: Self-pay | Admitting: Internal Medicine

## 2022-09-04 DIAGNOSIS — I700 Atherosclerosis of aorta: Secondary | ICD-10-CM | POA: Diagnosis not present

## 2022-09-04 DIAGNOSIS — G9332 Myalgic encephalomyelitis/chronic fatigue syndrome: Secondary | ICD-10-CM | POA: Diagnosis not present

## 2022-09-04 DIAGNOSIS — Z0001 Encounter for general adult medical examination with abnormal findings: Secondary | ICD-10-CM | POA: Diagnosis not present

## 2022-09-04 DIAGNOSIS — E118 Type 2 diabetes mellitus with unspecified complications: Secondary | ICD-10-CM | POA: Diagnosis not present

## 2022-09-04 DIAGNOSIS — M79604 Pain in right leg: Secondary | ICD-10-CM

## 2022-09-04 DIAGNOSIS — E782 Mixed hyperlipidemia: Secondary | ICD-10-CM | POA: Diagnosis not present

## 2022-09-04 DIAGNOSIS — E039 Hypothyroidism, unspecified: Secondary | ICD-10-CM | POA: Diagnosis not present

## 2022-09-04 DIAGNOSIS — E1165 Type 2 diabetes mellitus with hyperglycemia: Secondary | ICD-10-CM | POA: Diagnosis not present

## 2022-09-04 DIAGNOSIS — E1142 Type 2 diabetes mellitus with diabetic polyneuropathy: Secondary | ICD-10-CM | POA: Diagnosis not present

## 2022-09-04 DIAGNOSIS — D518 Other vitamin B12 deficiency anemias: Secondary | ICD-10-CM | POA: Diagnosis not present

## 2022-09-04 DIAGNOSIS — I25709 Atherosclerosis of coronary artery bypass graft(s), unspecified, with unspecified angina pectoris: Secondary | ICD-10-CM | POA: Diagnosis not present

## 2022-09-04 DIAGNOSIS — E559 Vitamin D deficiency, unspecified: Secondary | ICD-10-CM | POA: Diagnosis not present

## 2022-09-04 DIAGNOSIS — H900 Conductive hearing loss, bilateral: Secondary | ICD-10-CM | POA: Diagnosis not present

## 2022-09-06 ENCOUNTER — Other Ambulatory Visit: Payer: Self-pay | Admitting: Cardiology

## 2022-09-11 ENCOUNTER — Ambulatory Visit (HOSPITAL_COMMUNITY)
Admission: RE | Admit: 2022-09-11 | Discharge: 2022-09-11 | Disposition: A | Discharge status: Home / Self Care | Payer: Medicare Other | Source: Ambulatory Visit | Attending: Internal Medicine | Admitting: Internal Medicine

## 2022-09-11 DIAGNOSIS — M79605 Pain in left leg: Secondary | ICD-10-CM | POA: Insufficient documentation

## 2022-09-11 DIAGNOSIS — M79604 Pain in right leg: Secondary | ICD-10-CM | POA: Insufficient documentation

## 2022-10-13 ENCOUNTER — Other Ambulatory Visit: Payer: Self-pay | Admitting: Cardiology

## 2022-10-18 ENCOUNTER — Encounter: Payer: Self-pay | Admitting: Urology

## 2022-10-18 ENCOUNTER — Ambulatory Visit: Payer: Medicare Other | Admitting: Urology

## 2022-10-18 VITALS — BP 147/68 | HR 68

## 2022-10-18 DIAGNOSIS — N401 Enlarged prostate with lower urinary tract symptoms: Secondary | ICD-10-CM | POA: Diagnosis not present

## 2022-10-18 DIAGNOSIS — R972 Elevated prostate specific antigen [PSA]: Secondary | ICD-10-CM

## 2022-10-18 DIAGNOSIS — N138 Other obstructive and reflux uropathy: Secondary | ICD-10-CM | POA: Diagnosis not present

## 2022-10-18 DIAGNOSIS — R351 Nocturia: Secondary | ICD-10-CM | POA: Diagnosis not present

## 2022-10-18 MED ORDER — ALFUZOSIN HCL ER 10 MG PO TB24: 10.0000 mg | ORAL_TABLET | Freq: Every day | ORAL | 11 refills | Status: DC

## 2022-10-18 NOTE — Progress Notes (Unsigned)
10/18/2022 11:11 AM   Arvil Chaco 02/16/1927 [id number]  Referring provider: Elfredia Nevins, MD [address] [place],  [place] [address]  Chief Complaint  Patient presents with   Elevated PSA    HPI: Mr William Walker is a 89yo here for evaluation of elevated PSA and BPH. PSA was 10.1 this year and 9 last year. IPSS 12 QOL 3. Nocturia 5x. Urine stream fair. Intermittent straining to urinate. He takes torsemide.    PMH: Past Medical History:  Diagnosis Date   Aortic stenosis    a. s/p AVR in 11/2019   Arthritis    Coronary atherosclerosis of native coronary artery    a. s/p prior stenting of LAD b. cath in 11/2019 showing LM and 3-vessel CAD--> s/p CABG on 11/22/2019 with LIMA-LAD, SVG-dRCA and seq SVG-RI-OM1 and complicated by post-cardiotomy shock   CVD (cerebrovascular disease)    Essential hypertension    GERD (gastroesophageal reflux disease)    History of kidney stones    Hyperlipidemia    MI (myocardial infarction) [facility])    October 1999   Skin cancer    Type 2 diabetes mellitus with diabetic neuropathy [facility])     Surgical History: Past Surgical History:  Procedure Laterality Date   AORTIC VALVE REPLACEMENT N/A 11/22/2019   Procedure: AORTIC VALVE REPLACEMENT (AVR) USING INSPIRIS ;  Surgeon: Delight Ovens, MD;  Location: [facility] OR;  Service: Open Heart Surgery;  Laterality: N/A;   Arch cerebral ateriogram  12/11/2005   Kristen Loader Early MD   CATARACT EXTRACTION, BILATERAL     CORONARY ARTERY BYPASS GRAFT N/A 11/22/2019   Procedure: CORONARY ARTERY BYPASS GRAFTING (CABG), ON PUMP, TIMES FOUR , USING LEFT INTERNAL MAMMARY ARTERY AND ENDOSCOPICALLY HARVESTED RIGHT GREATER SAPHENOUS VEIN;  Surgeon: Delight Ovens, MD;  Location: [facility] OR;  Service: Open Heart Surgery;  Laterality: N/A;  SEQUENTIAL RAMUS INTERMEDIATE TO OM1 SAPHENOUS VEIN TO DISTAL PDA LIMA TO LAD   INGUINAL HERNIA REPAIR Left 12/09/2012   Procedure: HERNIA REPAIR INGUINAL with mesh;  Surgeon: Dalia Heading, MD;  Location: [facility] ORS;  Service: General;  Laterality: Left;   INGUINAL HERNIA REPAIR Right 02/15/2021   Procedure: OPEN RIGHT INGUINAL HERNIA REPAIR WITH MESH;  Surgeon: Darnell Level, MD;  Location: [facility] ORS;  Service: General;  Laterality: Right;   INSERTION OF MESH Left 12/09/2012   Procedure: INSERTION OF MESH;  Surgeon: Dalia Heading, MD;  Location: [facility] ORS;  Service: General;  Laterality: Left;   RIGHT/LEFT HEART CATH AND CORONARY ANGIOGRAPHY N/A 10/07/2017   Procedure: RIGHT/LEFT HEART CATH AND CORONARY ANGIOGRAPHY;  Surgeon: Tonny Bollman, MD;  Location: [facility] INVASIVE CV LAB;  Service: Cardiovascular;  Laterality: N/A;   RIGHT/LEFT HEART CATH AND CORONARY ANGIOGRAPHY N/A 11/16/2019   Procedure: RIGHT/LEFT HEART CATH AND CORONARY ANGIOGRAPHY;  Surgeon: Runell Gess, MD;  Location: [facility] INVASIVE CV LAB;  Service: Cardiovascular;  Laterality: N/A;   TEE WITHOUT CARDIOVERSION N/A 11/22/2019   Procedure: TRANSESOPHAGEAL ECHOCARDIOGRAM (TEE);  Surgeon: Delight Ovens, MD;  Location: [facility] OR;  Service: Open Heart Surgery;  Laterality: N/A;    Home Medications:  Allergies as of 10/18/2022   No Known Allergies      Medication List        Accurate as of Oct 18, 2022 11:11 AM. If you have any questions, ask your nurse or doctor.          clindamycin 300 MG capsule Commonly known as: CLEOCIN Take 900 mg by mouth See admin  instructions. Prior to dental work   Eliquis 2.5 MG Tabs tablet Generic drug: apixaban TAKE 1 TABLET BY MOUTH TWICE  DAILY   glipiZIDE 5 MG tablet Commonly known as: GLUCOTROL Take 1 tablet (5 mg total) by mouth daily before breakfast.   levothyroxine 75 MCG tablet Commonly known as: SYNTHROID Take 75 mcg by mouth daily before breakfast.   metolazone 2.5 MG tablet Commonly known as: ZAROXOLYN Take 2.5 mg by mouth daily.   metoprolol succinate 25 MG 24 hr tablet Commonly known as: TOPROL-XL TAKE ONE-HALF TABLET BY MOUTH  DAILY   multivitamin with  minerals Tabs tablet Take 1 tablet by mouth daily.   OneTouch Ultra test strip Generic drug: glucose blood   pantoprazole 40 MG tablet Commonly known as: PROTONIX Take 1 tablet (40 mg total) by mouth daily.   potassium chloride SA 20 MEQ tablet Commonly known as: KLOR-CON M Take 1.5 tablets (30 mEq total) by mouth daily.   pravastatin 40 MG tablet Commonly known as: PRAVACHOL TAKE 1 TABLET BY MOUTH IN THE  EVENING   Systane 0.4-0.3 % Soln Generic drug: Polyethyl Glycol-Propyl Glycol Place 1 drop into both eyes daily as needed (dry eyes).   torsemide 20 MG tablet Commonly known as: DEMADEX TAKE 2 TABLETS BY MOUTH DAILY  ALTERNATING WITH 1 TABLET BY  MOUTH DAILY   triamcinolone cream 0.1 % Commonly known as: KENALOG SMARTSIG:Topical 1-2 Times Daily PRN        Allergies: No Known Allergies  Family History: Family History  Problem Relation Age of Onset   Coronary artery disease Father     Social History:  reports that he quit smoking about 73 years ago. His smoking use included pipe and cigars. His smokeless tobacco use includes chew. He reports that he does not drink alcohol and does not use drugs.  ROS: All other review of systems were reviewed and are negative except what is noted above in HPI  Physical Exam: BP (!) 147/68   Pulse 68   Constitutional:  Alert and oriented, No acute distress. HEENT: [place] AT, moist mucus membranes.  Trachea midline, no masses. Cardiovascular: No clubbing, cyanosis, or edema. Respiratory: Normal respiratory effort, no increased work of breathing. GI: Abdomen is soft, nontender, nondistended, no abdominal masses GU: No CVA tenderness.  Lymph: No cervical or inguinal lymphadenopathy. Skin: No rashes, bruises or suspicious lesions. Neurologic: Grossly intact, no focal deficits, moving all 4 extremities. Psychiatric: Normal mood and affect.  Laboratory Data: Lab Results  Component Value Date   WBC 7.1 02/20/2022   HGB 13.1  02/20/2022   HCT 38.6 (L) 02/20/2022   MCV 102.9 (H) 02/20/2022   PLT 197 02/20/2022    Lab Results  Component Value Date   CREATININE 1.62 (H) 02/20/2022    No results found for: "PSA"  No results found for: "TESTOSTERONE"  Lab Results  Component Value Date   HGBA1C 7.4 (H) 12/28/2020    Urinalysis    Component Value Date/Time   COLORURINE YELLOW 11/21/2019 0923   APPEARANCEUR HAZY (A) 11/21/2019 0923   LABSPEC 1.017 11/21/2019 0923   PHURINE 5.0 11/21/2019 0923   GLUCOSEU NEGATIVE 11/21/2019 0923   HGBUR NEGATIVE 11/21/2019 0923   BILIRUBINUR NEGATIVE 11/21/2019 0923   KETONESUR 5 (A) 11/21/2019 0923   PROTEINUR NEGATIVE 11/21/2019 0923   NITRITE NEGATIVE 11/21/2019 0923   LEUKOCYTESUR LARGE (A) 11/21/2019 0923    Lab Results  Component Value Date   BACTERIA RARE (A) 11/21/2019    Pertinent Imaging:  No results found for this or any previous visit.  No results found for this or any previous visit.  No results found for this or any previous visit.  No results found for this or any previous visit.  Results for orders placed during the hospital encounter of 10/05/04  US Renal  Narrative Clinical Data: Sepsis. ULTRASOUND OF THE KIDNEYS: Scans through the kidneys were performed. No hydronephrosis is seen. The right kidney measures 10.9 cm sagittally with the left kidney measuring 12.5 cm. There is a simple cyst in the lower pole of 2.9 x 2.4 x 2.8 cm. The urinary bladder is decompressed with a Foley catheter present.  Impression No hydronephrosis. 2.9 cm left lower pole renal cyst.  Provider: Bonnita Hollow  No valid procedures specified. No results found for this or any previous visit.  No results found for this or any previous visit.   Assessment & Plan:    1. Elevated PSA -followup 6 mon ths with PSA - Urinalysis, Routine w reflex microscopic  2. Benign prostatic hyperplasia with urinary obstruction Uroxatral 10mg  qhs  3.  Nocturia -uroxatral 10mg  qhs   No follow-ups on file.  Wilkie Aye, MD  [facility] [place]

## 2022-10-22 ENCOUNTER — Encounter: Payer: Self-pay | Admitting: Urology

## 2022-10-22 NOTE — Patient Instructions (Signed)

## 2022-12-28 ENCOUNTER — Other Ambulatory Visit: Payer: Self-pay | Admitting: Student

## 2023-02-20 ENCOUNTER — Ambulatory Visit (HOSPITAL_COMMUNITY)
Admission: RE | Admit: 2023-02-20 | Discharge: 2023-02-20 | Disposition: A | Discharge status: Home / Self Care | Payer: Medicare Other | Source: Ambulatory Visit | Attending: Cardiology | Admitting: Cardiology

## 2023-02-20 DIAGNOSIS — I6523 Occlusion and stenosis of bilateral carotid arteries: Secondary | ICD-10-CM | POA: Diagnosis not present

## 2023-02-20 DIAGNOSIS — I779 Disorder of arteries and arterioles, unspecified: Secondary | ICD-10-CM | POA: Insufficient documentation

## 2023-02-21 ENCOUNTER — Other Ambulatory Visit (HOSPITAL_COMMUNITY)
Admission: RE | Admit: 2023-02-21 | Discharge: 2023-02-21 | Disposition: A | Discharge status: Home / Self Care | Payer: Medicare Other | Source: Ambulatory Visit | Attending: Cardiology | Admitting: Cardiology

## 2023-02-21 ENCOUNTER — Encounter: Payer: Self-pay | Admitting: Cardiology

## 2023-02-21 ENCOUNTER — Ambulatory Visit: Payer: Medicare Other | Attending: Cardiology | Admitting: Cardiology

## 2023-02-21 VITALS — BP 124/68 | HR 62 | Ht 70.0 in | Wt 205.4 lb

## 2023-02-21 DIAGNOSIS — I25119 Atherosclerotic heart disease of native coronary artery with unspecified angina pectoris: Secondary | ICD-10-CM | POA: Diagnosis not present

## 2023-02-21 DIAGNOSIS — I5032 Chronic diastolic (congestive) heart failure: Secondary | ICD-10-CM | POA: Diagnosis not present

## 2023-02-21 DIAGNOSIS — I483 Typical atrial flutter: Secondary | ICD-10-CM | POA: Diagnosis not present

## 2023-02-21 DIAGNOSIS — Z953 Presence of xenogenic heart valve: Secondary | ICD-10-CM | POA: Diagnosis not present

## 2023-02-21 DIAGNOSIS — I2510 Atherosclerotic heart disease of native coronary artery without angina pectoris: Secondary | ICD-10-CM

## 2023-02-21 DIAGNOSIS — I6523 Occlusion and stenosis of bilateral carotid arteries: Secondary | ICD-10-CM | POA: Diagnosis not present

## 2023-02-21 LAB — BASIC METABOLIC PANEL
Anion gap: 13 (ref 5–15)
BUN: 81 mg/dL — ABNORMAL HIGH (ref 8–23)
CO2: 28 mmol/L (ref 22–32)
Calcium: 9.3 mg/dL (ref 8.9–10.3)
Chloride: 93 mmol/L — ABNORMAL LOW (ref 98–111)
Creatinine, Ser: 2.36 mg/dL — ABNORMAL HIGH (ref 0.61–1.24)
GFR, Estimated: 25 mL/min — ABNORMAL LOW (ref >60)
Glucose, Bld: 102 mg/dL — ABNORMAL HIGH (ref 70–99)
Potassium: 3.5 mmol/L (ref 3.5–5.1)
Sodium: 134 mmol/L — ABNORMAL LOW (ref 135–145)

## 2023-02-21 LAB — CBC
HCT: 33.6 % — ABNORMAL LOW (ref 39.0–52.0)
Hemoglobin: 11.3 g/dL — ABNORMAL LOW (ref 13.0–17.0)
MCH: 35.1 pg — ABNORMAL HIGH (ref 26.0–34.0)
MCHC: 33.6 g/dL (ref 30.0–36.0)
MCV: 104.3 fL — ABNORMAL HIGH (ref 80.0–100.0)
Platelets: 116 10*3/uL — ABNORMAL LOW (ref 150–400)
RBC: 3.22 MIL/uL — ABNORMAL LOW (ref 4.22–5.81)
RDW: 15.9 % — ABNORMAL HIGH (ref 11.5–15.5)
WBC: 4.8 10*3/uL (ref 4.0–10.5)
nRBC: 0 % (ref 0.0–0.2)

## 2023-02-21 NOTE — Patient Instructions (Signed)
Medication Instructions:   Your physician recommends that you continue on your current medications as directed. Please refer to the Current Medication list given to you today.   Labwork: Cbc,bmet today  Testing/Procedures:  None today  Follow-Up: 6 months  Any Other Special Instructions Will Be Listed Below (If Applicable).  If you need a refill on your cardiac medications before your next appointment, please call your pharmacy.

## 2023-02-21 NOTE — Progress Notes (Signed)
Cardiology Office Note  Date: 02/21/2023   ID: Dontre, Fasciano [birth year], MRN [id number]  History of Present Illness: William Walker is a 89 y.o. male last seen in March.  He is here today with his daughter for a follow-up visit.  In a wheelchair on which he has been fairly dependent for mobility around his house.  They are trying to get a wheelchair access built and asked me for a prescription to help this get taken care of through the [facility] system.  He does not report any angina or palpitations, has been more short of breath recently.  No cough but some upper airway congestion.  He is using an antihistamine.  Leg edema fluctuates as before and he continues on stable diuretic regimen with potassium supplement.  Follow-up carotid Doppler results are pending at this time.  He does not plan on specific intervention/revascularization and we will most likely discontinue surveillance.  I reviewed his medications, he denies any spontaneous bleeding problems on Eliquis.  Also continues on Toprol-XL with good heart rate control.  ECG today shows atrial flutter with 3:1 block, IVCD, nonspecific ST-T changes.  Physical Exam: VS:  BP 124/68 (BP Location: Left Arm, Patient Position: Sitting, Cuff Size: Normal)   Pulse 62   Ht 5\' 10"  (1.778 m)   Wt 205 lb 6.4 oz (93.2 kg)   SpO2 94%   BMI 29.47 kg/m , BMI Body mass index is 29.47 kg/m.  Wt Readings from Last 3 Encounters:  02/21/23 205 lb 6.4 oz (93.2 kg)  08/16/22 196 lb 6.4 oz (89.1 kg)  02/20/22 186 lb (84.4 kg)    General: Patient appears comfortable at rest. HEENT: Conjunctiva and lids normal. Neck: Supple, no elevated JVP or carotid bruits. Lungs: Clear to auscultation, nonlabored breathing at rest. Cardiac: Regular rate and rhythm, no S3, 2/6 systolic murmur. Extremities: Mild lower leg edema.  ECG:  An ECG dated 02/20/2022 was personally reviewed today and demonstrated:  Atypical atrial flutter with 2:1 block, IVCD and  repolarization abnormalities.  Labwork:  April 2024: Hemoglobin 12.6, platelets 183, BUN 56, creatinine 1.89, potassium 3.9, AST 28, ALT 22, cholesterol 142, triglycerides 111, HDL 48, LDL 74  Other Studies Reviewed Today:  No interval cardiac testing for review.  Assessment and Plan:  1.  Multivessel CAD with remote stent intervention to the LAD and more recently CABG in June 2021 with LIMA to LAD, SVG to RCA, and SVG to ramus intermedius and first obtuse marginal.  He does not report any angina with low-level activity.  Continue Pravachol, not on aspirin given concurrent use of Eliquis.   2.  History of severe degenerative calcific aortic stenosis status post 23 mm Inspiris bioprosthetic AVR at the time of CABG in June 2021.  Echocardiogram in June 2022 revealed mean AV gradient 6 mmHg with mild aortic regurgitation.  No significant change in cardiac exam.   3.  Persistent typical atrial flutter with CHA2DS2-VASc score of 6.  Confirmed by ECG today and asymptomatic.  Continue Toprol-XL and Eliquis currently 2.5 mg twice daily.  Check CBC and BMET.   4.  HFpEF, LVEF 55 to 60% with restrictive diastolic filling pattern and mildly decreased RV contraction by echocardiogram in June 2022.  Continue Demadex with potassium supplement.  Also Zaroxolyn.   5.  CKD stage IIIb.  Creatinine 1.89 in April.   6.  Asymptomatic carotid artery disease, occluded LICA and greater than 70% RICA by carotid Dopplers in June 2023.  Follow-up study pending results.  He does not plan on further intervention we will most likely stop surveillance going forward.  Disposition:  Follow up  6 months.  Signed, Jonelle Sidle, M.D., F.A.C.C. [facility]

## 2023-02-26 ENCOUNTER — Other Ambulatory Visit: Payer: Self-pay | Admitting: Cardiology

## 2023-02-26 DIAGNOSIS — I483 Typical atrial flutter: Secondary | ICD-10-CM

## 2023-02-27 NOTE — Telephone Encounter (Signed)
Prescription refill request for Eliquis received. Indication: A Flutter Last office visit: 02/21/23  Ival Bible MD Scr: 2.36 on 02/21/23  Epic Age: 89 Weight: 93.2kg  Based on above findings Eliquis 2.5mg  twice daily is the appropriate dose.  Refill approved.

## 2023-04-02 ENCOUNTER — Other Ambulatory Visit: Payer: Self-pay | Admitting: Student

## 2023-04-22 ENCOUNTER — Other Ambulatory Visit: Payer: Medicare Other

## 2023-04-22 DIAGNOSIS — R972 Elevated prostate specific antigen [PSA]: Secondary | ICD-10-CM

## 2023-04-23 LAB — PSA: Prostate Specific Ag, Serum: 8.3 ng/mL — ABNORMAL HIGH (ref 0.0–4.0)

## 2023-04-30 ENCOUNTER — Ambulatory Visit: Payer: Medicare Other | Admitting: Urology

## 2023-04-30 VITALS — BP 125/67 | HR 65

## 2023-04-30 DIAGNOSIS — N138 Other obstructive and reflux uropathy: Secondary | ICD-10-CM

## 2023-04-30 DIAGNOSIS — R972 Elevated prostate specific antigen [PSA]: Secondary | ICD-10-CM

## 2023-04-30 DIAGNOSIS — N401 Enlarged prostate with lower urinary tract symptoms: Secondary | ICD-10-CM | POA: Diagnosis not present

## 2023-04-30 DIAGNOSIS — R351 Nocturia: Secondary | ICD-10-CM | POA: Diagnosis not present

## 2023-04-30 MED ORDER — GEMTESA 75 MG PO TABS: 1.0000 | ORAL_TABLET | Freq: Every day | ORAL | Status: DC

## 2023-04-30 NOTE — Progress Notes (Signed)
04/30/2023 10:05 AM   William Walker 12/05/1926 [id number]  Referring provider: Elfredia Nevins, MD [address] [place],  [place] [address]  Followup nocturia and elevated    HPI: William Walker is a 89yo here for followup for elevated PSa and nocturia. Nocturia 5-9x. Daytime frequency is dependent on his torsemide. IPSS 15 QOl 4 on uroxatral 10mg . Urine stream strong. PSA decreased to 8.3.    PMH: Past Medical History:  Diagnosis Date   Aortic stenosis    a. s/p AVR in 11/2019   Arthritis    Coronary atherosclerosis of native coronary artery    a. s/p prior stenting of LAD b. cath in 11/2019 showing LM and 3-vessel CAD--> s/p CABG on 11/22/2019 with LIMA-LAD, SVG-dRCA and seq SVG-RI-OM1 and complicated by post-cardiotomy shock   CVD (cerebrovascular disease)    Essential hypertension    GERD (gastroesophageal reflux disease)    History of kidney stones    Hyperlipidemia    MI (myocardial infarction) [facility])    October 1999   Skin cancer    Type 2 diabetes mellitus with diabetic neuropathy [facility])     Surgical History: Past Surgical History:  Procedure Laterality Date   AORTIC VALVE REPLACEMENT N/A 11/22/2019   Procedure: AORTIC VALVE REPLACEMENT (AVR) USING INSPIRIS ;  Surgeon: Delight Ovens, MD;  Location: [facility] OR;  Service: Open Heart Surgery;  Laterality: N/A;   Arch cerebral ateriogram  12/11/2005   Kristen Loader Early MD   CATARACT EXTRACTION, BILATERAL     CORONARY ARTERY BYPASS GRAFT N/A 11/22/2019   Procedure: CORONARY ARTERY BYPASS GRAFTING (CABG), ON PUMP, TIMES FOUR , USING LEFT INTERNAL MAMMARY ARTERY AND ENDOSCOPICALLY HARVESTED RIGHT GREATER SAPHENOUS VEIN;  Surgeon: Delight Ovens, MD;  Location: [facility] OR;  Service: Open Heart Surgery;  Laterality: N/A;  SEQUENTIAL RAMUS INTERMEDIATE TO OM1 SAPHENOUS VEIN TO DISTAL PDA LIMA TO LAD   INGUINAL HERNIA REPAIR Left 12/09/2012   Procedure: HERNIA REPAIR INGUINAL with mesh;  Surgeon: Dalia Heading, MD;  Location: [facility]  ORS;  Service: General;  Laterality: Left;   INGUINAL HERNIA REPAIR Right 02/15/2021   Procedure: OPEN RIGHT INGUINAL HERNIA REPAIR WITH MESH;  Surgeon: Darnell Level, MD;  Location: [facility] ORS;  Service: General;  Laterality: Right;   INSERTION OF MESH Left 12/09/2012   Procedure: INSERTION OF MESH;  Surgeon: Dalia Heading, MD;  Location: [facility] ORS;  Service: General;  Laterality: Left;   RIGHT/LEFT HEART CATH AND CORONARY ANGIOGRAPHY N/A 10/07/2017   Procedure: RIGHT/LEFT HEART CATH AND CORONARY ANGIOGRAPHY;  Surgeon: Tonny Bollman, MD;  Location: [facility] INVASIVE CV LAB;  Service: Cardiovascular;  Laterality: N/A;   RIGHT/LEFT HEART CATH AND CORONARY ANGIOGRAPHY N/A 11/16/2019   Procedure: RIGHT/LEFT HEART CATH AND CORONARY ANGIOGRAPHY;  Surgeon: Runell Gess, MD;  Location: [facility] INVASIVE CV LAB;  Service: Cardiovascular;  Laterality: N/A;   TEE WITHOUT CARDIOVERSION N/A 11/22/2019   Procedure: TRANSESOPHAGEAL ECHOCARDIOGRAM (TEE);  Surgeon: Delight Ovens, MD;  Location: [facility] OR;  Service: Open Heart Surgery;  Laterality: N/A;    Home Medications:  Allergies as of 04/30/2023   No Known Allergies      Medication List        Accurate as of April 30, 2023 10:05 AM. If you have any questions, ask your nurse or doctor.          alfuzosin 10 MG 24 hr tablet Commonly known as: UROXATRAL Take 1 tablet (10 mg total) by mouth at bedtime.   clindamycin  300 MG capsule Commonly known as: CLEOCIN Take 900 mg by mouth See admin instructions. Prior to dental work   Eliquis 2.5 MG Tabs tablet Generic drug: apixaban TAKE 1 TABLET BY MOUTH TWICE  DAILY   glipiZIDE 5 MG tablet Commonly known as: GLUCOTROL Take 1 tablet (5 mg total) by mouth daily before breakfast.   levothyroxine 75 MCG tablet Commonly known as: SYNTHROID Take 75 mcg by mouth daily before breakfast.   metolazone 2.5 MG tablet Commonly known as: ZAROXOLYN Take 2.5 mg by mouth daily.   metoprolol succinate 25 MG 24 hr  tablet Commonly known as: TOPROL-XL TAKE ONE-HALF TABLET BY MOUTH  DAILY   multivitamin with minerals Tabs tablet Take 1 tablet by mouth daily.   OneTouch Ultra test strip Generic drug: glucose blood   pantoprazole 40 MG tablet Commonly known as: PROTONIX Take 1 tablet (40 mg total) by mouth daily.   potassium chloride SA 20 MEQ tablet Commonly known as: KLOR-CON M TAKE 1 AND 1/2 TABLETS BY MOUTH  DAILY   pravastatin 40 MG tablet Commonly known as: PRAVACHOL TAKE 1 TABLET BY MOUTH IN THE  EVENING   Systane 0.4-0.3 % Soln Generic drug: Polyethyl Glycol-Propyl Glycol Place 1 drop into both eyes daily as needed (dry eyes).   torsemide 20 MG tablet Commonly known as: DEMADEX TAKE 2 TABLETS BY MOUTH DAILY  ALTERNATING WITH 1 TABLET BY  MOUTH DAILY   triamcinolone cream 0.1 % Commonly known as: KENALOG SMARTSIG:Topical 1-2 Times Daily PRN        Allergies: No Known Allergies  Family History: Family History  Problem Relation Age of Onset   Coronary artery disease Father     Social History:  reports that he quit smoking about 74 years ago. His smoking use included pipe and cigars. His smokeless tobacco use includes chew. He reports that he does not drink alcohol and does not use drugs.  ROS: All other review of systems were reviewed and are negative except what is noted above in HPI  Physical Exam: BP 125/67   Pulse 65   Constitutional:  Alert and oriented, No acute distress. HEENT: [place] AT, moist mucus membranes.  Trachea midline, no masses. Cardiovascular: No clubbing, cyanosis, or edema. Respiratory: Normal respiratory effort, no increased work of breathing. GI: Abdomen is soft, nontender, nondistended, no abdominal masses GU: No CVA tenderness.  Lymph: No cervical or inguinal lymphadenopathy. Skin: No rashes, bruises or suspicious lesions. Neurologic: Grossly intact, no focal deficits, moving all 4 extremities. Psychiatric: Normal mood and  affect.  Laboratory Data: Lab Results  Component Value Date   WBC 4.8 02/21/2023   HGB 11.3 (L) 02/21/2023   HCT 33.6 (L) 02/21/2023   MCV 104.3 (H) 02/21/2023   PLT 116 (L) 02/21/2023    Lab Results  Component Value Date   CREATININE 2.36 (H) 02/21/2023    No results found for: "PSA"  No results found for: "TESTOSTERONE"  Lab Results  Component Value Date   HGBA1C 7.4 (H) 12/28/2020    Urinalysis    Component Value Date/Time   COLORURINE YELLOW 11/21/2019 0923   APPEARANCEUR HAZY (A) 11/21/2019 0923   LABSPEC 1.017 11/21/2019 0923   PHURINE 5.0 11/21/2019 0923   GLUCOSEU NEGATIVE 11/21/2019 0923   HGBUR NEGATIVE 11/21/2019 0923   BILIRUBINUR NEGATIVE 11/21/2019 0923   KETONESUR 5 (A) 11/21/2019 0923   PROTEINUR NEGATIVE 11/21/2019 0923   NITRITE NEGATIVE 11/21/2019 0923   LEUKOCYTESUR LARGE (A) 11/21/2019 0923    Lab Results  Component Value Date   BACTERIA RARE (A) 11/21/2019    Pertinent Imaging:  No results found for this or any previous visit.  No results found for this or any previous visit.  No results found for this or any previous visit.  No results found for this or any previous visit.  Results for orders placed during the hospital encounter of 10/05/04  US Renal  Narrative Clinical Data: Sepsis. ULTRASOUND OF THE KIDNEYS: Scans through the kidneys were performed. No hydronephrosis is seen. The right kidney measures 10.9 cm sagittally with the left kidney measuring 12.5 cm. There is a simple cyst in the lower pole of 2.9 x 2.4 x 2.8 cm. The urinary bladder is decompressed with a Foley catheter present.  Impression No hydronephrosis. 2.9 cm left lower pole renal cyst.  Provider: Bonnita Hollow  No valid procedures specified. No results found for this or any previous visit.  No results found for this or any previous visit.   Assessment & Plan:    1. Elevated PSA 1 year with PSA - Urinalysis, Routine w reflex microscopic  2.  Nocturia Gemtesa 75mg  daily  3. Benign prostatic hyperplasia with urinary obstruction Gemtesa 75mg  daily   No follow-ups on file.  Wilkie Aye, MD  [facility] [place]

## 2023-05-02 ENCOUNTER — Ambulatory Visit: Payer: Medicare Other | Admitting: Urology

## 2023-05-03 ENCOUNTER — Encounter: Payer: Self-pay | Admitting: Urology

## 2023-05-03 NOTE — Patient Instructions (Signed)

## 2023-06-03 ENCOUNTER — Other Ambulatory Visit: Payer: Self-pay | Admitting: Cardiology

## 2023-06-11 ENCOUNTER — Encounter: Payer: Self-pay | Admitting: Urology

## 2023-06-24 ENCOUNTER — Other Ambulatory Visit: Payer: Self-pay

## 2023-06-24 MED ORDER — MIRABEGRON ER 25 MG PO TB24: 25.0000 mg | ORAL_TABLET | Freq: Every day | ORAL | 0 refills | Status: DC

## 2023-07-08 ENCOUNTER — Other Ambulatory Visit: Payer: Self-pay | Admitting: Cardiology

## 2023-09-29 DIAGNOSIS — Z0001 Encounter for general adult medical examination with abnormal findings: Secondary | ICD-10-CM | POA: Diagnosis not present

## 2023-09-29 DIAGNOSIS — E1142 Type 2 diabetes mellitus with diabetic polyneuropathy: Secondary | ICD-10-CM | POA: Diagnosis not present

## 2023-09-29 DIAGNOSIS — I25709 Atherosclerosis of coronary artery bypass graft(s), unspecified, with unspecified angina pectoris: Secondary | ICD-10-CM | POA: Diagnosis not present

## 2023-09-29 DIAGNOSIS — I700 Atherosclerosis of aorta: Secondary | ICD-10-CM | POA: Diagnosis not present

## 2023-09-29 DIAGNOSIS — H900 Conductive hearing loss, bilateral: Secondary | ICD-10-CM | POA: Diagnosis not present

## 2023-09-29 DIAGNOSIS — E559 Vitamin D deficiency, unspecified: Secondary | ICD-10-CM | POA: Diagnosis not present

## 2023-09-29 DIAGNOSIS — E1165 Type 2 diabetes mellitus with hyperglycemia: Secondary | ICD-10-CM | POA: Diagnosis not present

## 2023-09-29 DIAGNOSIS — E782 Mixed hyperlipidemia: Secondary | ICD-10-CM | POA: Diagnosis not present

## 2023-09-29 DIAGNOSIS — I351 Nonrheumatic aortic (valve) insufficiency: Secondary | ICD-10-CM | POA: Diagnosis not present

## 2023-10-07 ENCOUNTER — Other Ambulatory Visit: Payer: Self-pay | Admitting: Cardiology

## 2023-10-07 DIAGNOSIS — I483 Typical atrial flutter: Secondary | ICD-10-CM

## 2023-10-08 NOTE — Telephone Encounter (Signed)
 Prescription refill request for Eliquis  received. Indication: A Flutter Last office visit: 02/21/23  Anselm Basset MD Scr: 2.36 on 02/21/23  Epic Age: 89 Weight: 93.2kg  Based on above findings Eliquis  2.5mg  twice daily is the appropriate dose.  Refill approved.

## 2023-10-29 ENCOUNTER — Other Ambulatory Visit (HOSPITAL_COMMUNITY): Payer: Self-pay | Admitting: Nephrology

## 2023-10-29 DIAGNOSIS — I10 Essential (primary) hypertension: Secondary | ICD-10-CM

## 2023-10-29 DIAGNOSIS — N184 Chronic kidney disease, stage 4 (severe): Secondary | ICD-10-CM

## 2023-10-29 DIAGNOSIS — E1122 Type 2 diabetes mellitus with diabetic chronic kidney disease: Secondary | ICD-10-CM

## 2023-10-31 ENCOUNTER — Ambulatory Visit: Payer: Medicare Other | Admitting: Urology

## 2023-11-06 ENCOUNTER — Ambulatory Visit (HOSPITAL_COMMUNITY)
Admission: RE | Admit: 2023-11-06 | Discharge: 2023-11-06 | Disposition: A | Discharge status: Home / Self Care | Source: Ambulatory Visit | Attending: Nephrology | Admitting: Nephrology

## 2023-11-06 DIAGNOSIS — N184 Chronic kidney disease, stage 4 (severe): Secondary | ICD-10-CM | POA: Insufficient documentation

## 2023-11-06 DIAGNOSIS — E1122 Type 2 diabetes mellitus with diabetic chronic kidney disease: Secondary | ICD-10-CM | POA: Diagnosis not present

## 2023-11-06 DIAGNOSIS — I10 Essential (primary) hypertension: Secondary | ICD-10-CM | POA: Insufficient documentation

## 2023-11-12 DIAGNOSIS — E039 Hypothyroidism, unspecified: Secondary | ICD-10-CM | POA: Diagnosis not present

## 2023-11-12 DIAGNOSIS — N289 Disorder of kidney and ureter, unspecified: Secondary | ICD-10-CM | POA: Diagnosis not present

## 2023-12-13 ENCOUNTER — Other Ambulatory Visit: Payer: Self-pay | Admitting: Cardiology

## 2023-12-24 DIAGNOSIS — N289 Disorder of kidney and ureter, unspecified: Secondary | ICD-10-CM | POA: Diagnosis not present

## 2023-12-24 DIAGNOSIS — E039 Hypothyroidism, unspecified: Secondary | ICD-10-CM | POA: Diagnosis not present

## 2023-12-30 DIAGNOSIS — R809 Proteinuria, unspecified: Secondary | ICD-10-CM | POA: Diagnosis not present

## 2023-12-30 DIAGNOSIS — E1122 Type 2 diabetes mellitus with diabetic chronic kidney disease: Secondary | ICD-10-CM | POA: Diagnosis not present

## 2023-12-30 DIAGNOSIS — N184 Chronic kidney disease, stage 4 (severe): Secondary | ICD-10-CM | POA: Diagnosis not present

## 2023-12-30 DIAGNOSIS — D631 Anemia in chronic kidney disease: Secondary | ICD-10-CM | POA: Diagnosis not present

## 2024-01-10 ENCOUNTER — Encounter (HOSPITAL_COMMUNITY): Payer: Self-pay | Admitting: Emergency Medicine

## 2024-01-10 ENCOUNTER — Other Ambulatory Visit: Payer: Self-pay

## 2024-01-10 ENCOUNTER — Emergency Department (HOSPITAL_COMMUNITY)

## 2024-01-10 ENCOUNTER — Emergency Department (HOSPITAL_COMMUNITY)
Admission: EM | Admit: 2024-01-10 | Discharge: 2024-01-10 | Disposition: A | Discharge status: Home / Self Care | Attending: Emergency Medicine | Admitting: Emergency Medicine

## 2024-01-10 DIAGNOSIS — R600 Localized edema: Secondary | ICD-10-CM | POA: Diagnosis not present

## 2024-01-10 DIAGNOSIS — R0602 Shortness of breath: Secondary | ICD-10-CM | POA: Insufficient documentation

## 2024-01-10 DIAGNOSIS — N3001 Acute cystitis with hematuria: Secondary | ICD-10-CM | POA: Diagnosis not present

## 2024-01-10 DIAGNOSIS — I6782 Cerebral ischemia: Secondary | ICD-10-CM | POA: Diagnosis not present

## 2024-01-10 DIAGNOSIS — R609 Edema, unspecified: Secondary | ICD-10-CM | POA: Diagnosis not present

## 2024-01-10 DIAGNOSIS — Z7984 Long term (current) use of oral hypoglycemic drugs: Secondary | ICD-10-CM | POA: Diagnosis not present

## 2024-01-10 DIAGNOSIS — R0989 Other specified symptoms and signs involving the circulatory and respiratory systems: Secondary | ICD-10-CM | POA: Diagnosis not present

## 2024-01-10 DIAGNOSIS — R413 Other amnesia: Secondary | ICD-10-CM | POA: Diagnosis not present

## 2024-01-10 DIAGNOSIS — R918 Other nonspecific abnormal finding of lung field: Secondary | ICD-10-CM | POA: Diagnosis not present

## 2024-01-10 DIAGNOSIS — Z7901 Long term (current) use of anticoagulants: Secondary | ICD-10-CM | POA: Diagnosis not present

## 2024-01-10 DIAGNOSIS — R531 Weakness: Secondary | ICD-10-CM | POA: Diagnosis not present

## 2024-01-10 DIAGNOSIS — Z79899 Other long term (current) drug therapy: Secondary | ICD-10-CM | POA: Diagnosis not present

## 2024-01-10 DIAGNOSIS — M7989 Other specified soft tissue disorders: Secondary | ICD-10-CM | POA: Diagnosis present

## 2024-01-10 LAB — CBC
HCT: 36.5 % — ABNORMAL LOW (ref 39.0–52.0)
Hemoglobin: 12.4 g/dL — ABNORMAL LOW (ref 13.0–17.0)
MCH: 35.2 pg — ABNORMAL HIGH (ref 26.0–34.0)
MCHC: 34 g/dL (ref 30.0–36.0)
MCV: 103.7 fL — ABNORMAL HIGH (ref 80.0–100.0)
Platelets: 129 K/uL — ABNORMAL LOW (ref 150–400)
RBC: 3.52 MIL/uL — ABNORMAL LOW (ref 4.22–5.81)
RDW: 15.1 % (ref 11.5–15.5)
WBC: 5.6 K/uL (ref 4.0–10.5)
nRBC: 0 % (ref 0.0–0.2)

## 2024-01-10 LAB — URINALYSIS, W/ REFLEX TO CULTURE (INFECTION SUSPECTED)
Bacteria, UA: NONE SEEN
Bilirubin Urine: NEGATIVE
Glucose, UA: NEGATIVE mg/dL
Hgb urine dipstick: NEGATIVE
Ketones, ur: NEGATIVE mg/dL
Nitrite: NEGATIVE
Protein, ur: NEGATIVE mg/dL
Specific Gravity, Urine: 1.009 (ref 1.005–1.030)
pH: 6 (ref 5.0–8.0)

## 2024-01-10 LAB — BASIC METABOLIC PANEL WITH GFR
Anion gap: 13 (ref 5–15)
BUN: 73 mg/dL — ABNORMAL HIGH (ref 8–23)
CO2: 26 mmol/L (ref 22–32)
Calcium: 9.2 mg/dL (ref 8.9–10.3)
Chloride: 95 mmol/L — ABNORMAL LOW (ref 98–111)
Creatinine, Ser: 1.86 mg/dL — ABNORMAL HIGH (ref 0.61–1.24)
GFR, Estimated: 33 mL/min — ABNORMAL LOW (ref >60)
Glucose, Bld: 137 mg/dL — ABNORMAL HIGH (ref 70–99)
Potassium: 3.5 mmol/L (ref 3.5–5.1)
Sodium: 134 mmol/L — ABNORMAL LOW (ref 135–145)

## 2024-01-10 LAB — HEPATIC FUNCTION PANEL
ALT: 22 U/L (ref 0–44)
AST: 28 U/L (ref 15–41)
Albumin: 4.1 g/dL (ref 3.5–5.0)
Alkaline Phosphatase: 120 U/L (ref 38–126)
Bilirubin, Direct: 0.3 mg/dL — ABNORMAL HIGH (ref 0.0–0.2)
Indirect Bilirubin: 1.2 mg/dL — ABNORMAL HIGH (ref 0.3–0.9)
Total Bilirubin: 1.5 mg/dL — ABNORMAL HIGH (ref 0.0–1.2)
Total Protein: 7 g/dL (ref 6.5–8.1)

## 2024-01-10 LAB — BRAIN NATRIURETIC PEPTIDE: B Natriuretic Peptide: 544 pg/mL — ABNORMAL HIGH (ref 0.0–100.0)

## 2024-01-10 MED ORDER — SODIUM CHLORIDE 0.9 % IV SOLN
2.0000 g | Freq: Once | INTRAVENOUS | Status: AC
  Administered 2024-01-10: 2 g via INTRAVENOUS
  Filled 2024-01-10: qty 20

## 2024-01-10 MED ORDER — CEPHALEXIN 500 MG PO CAPS: 500.0000 mg | ORAL_CAPSULE | Freq: Four times a day (QID) | ORAL | 0 refills | Status: DC

## 2024-01-10 NOTE — Discharge Instructions (Signed)
 Given the torsemide  pill, once every other day along with the potassium once every other day.  Follow-up with your family doctor next week to check your fluid level and see how the urinary tract infection is doing

## 2024-01-10 NOTE — ED Provider Notes (Signed)
 [place] [facility] AT [facility] Provider Note   CSN: [id number] Arrival date & time: 01/10/24  9241     Patient presents with: Shortness of Breath and Leg Swelling   William Walker is a 89 y.o. male.   Patient awoke with some confusion and weakness according to his daughter.  Patient is back to his normal mental capacity now   Weakness Severity:  Mild Onset quality:  Sudden Timing:  Intermittent Chronicity:  New Context: not alcohol use   Relieved by:  Nothing Worsened by:  Nothing Ineffective treatments:  None tried Associated symptoms: no abdominal pain, no chest pain, no cough, no diarrhea, no frequency, no headaches and no seizures        Prior to Admission medications   Medication Sig Start Date End Date Taking? Authorizing Provider  apixaban  (ELIQUIS ) 2.5 MG TABS tablet TAKE 1 TABLET BY MOUTH TWICE  DAILY 10/08/23  Yes Debera Jayson MATSU, MD  cephALEXin  (KEFLEX ) 500 MG capsule Take 1 capsule (500 mg total) by mouth 4 (four) times daily. 01/10/24  Yes Suzette Pac, MD  glipiZIDE  (GLUCOTROL ) 5 MG tablet Take 1 tablet (5 mg total) by mouth daily before breakfast. Patient taking differently: Take 5 mg by mouth 2 (two) times daily before a meal. 12/15/19  Yes Angiulli, Toribio PARAS, PA-C  alfuzosin  (UROXATRAL ) 10 MG 24 hr tablet Take 1 tablet (10 mg total) by mouth at bedtime. 10/18/22   McKenzie, Belvie CROME, MD  clindamycin (CLEOCIN) 300 MG capsule Take 900 mg by mouth See admin instructions. Prior to dental work    [provider]  levothyroxine  (SYNTHROID ) 75 MCG tablet Take 125 mcg by mouth daily before breakfast.    [provider]  metolazone (ZAROXOLYN) 2.5 MG tablet Take 2.5 mg by mouth daily.    [provider]  metoprolol  succinate (TOPROL -XL) 25 MG 24 hr tablet TAKE ONE-HALF TABLET BY MOUTH  DAILY 06/05/23   Debera Jayson MATSU, MD  mirabegron  ER (MYRBETRIQ ) 25 MG TB24 tablet Take 1 tablet (25 mg total) by mouth daily.  06/24/23   McKenzie, Belvie CROME, MD  Multiple Vitamin (MULTIVITAMIN WITH MINERALS) TABS tablet Take 1 tablet by mouth daily. 12/08/19   Viviane Lemond BRAVO, PA-C  ONETOUCH ULTRA test strip  06/26/22   [provider]  pantoprazole  (PROTONIX ) 40 MG tablet Take 1 tablet (40 mg total) by mouth daily. 12/15/19   Angiulli, Toribio PARAS, PA-C  Polyethyl Glycol-Propyl Glycol (SYSTANE) 0.4-0.3 % SOLN Place 1 drop into both eyes daily as needed (dry eyes).    [provider]  potassium chloride  SA (KLOR-CON  M) 20 MEQ tablet TAKE 1 AND 1/2 TABLETS BY MOUTH  DAILY Patient taking differently: Take 20 mEq by mouth daily. 07/08/23   Debera Jayson MATSU, MD  pravastatin  (PRAVACHOL ) 40 MG tablet TAKE 1 TABLET BY MOUTH IN THE  EVENING 07/08/23   Debera Jayson MATSU, MD  torsemide  (DEMADEX ) 20 MG tablet TAKE 2 TABLETS BY MOUTH DAILY  ALTERNATING WITH 1 TABLET BY  MOUTH DAILY Patient taking differently: Take 20 mg by mouth daily. 12/16/23   Debera Jayson MATSU, MD  triamcinolone cream (KENALOG) 0.1 % SMARTSIG:Topical 1-2 Times Daily PRN 09/08/22   [provider]    Allergies: Januvia [sitagliptin]    Review of Systems  Constitutional:  Negative for appetite change and fatigue.  HENT:  Negative for congestion, ear discharge and sinus pressure.   Eyes:  Negative for discharge.  Respiratory:  Negative for cough.  Cardiovascular:  Negative for chest pain.  Gastrointestinal:  Negative for abdominal pain and diarrhea.  Genitourinary:  Negative for frequency and hematuria.  Musculoskeletal:  Negative for back pain.  Skin:  Negative for rash.  Neurological:  Positive for weakness. Negative for seizures and headaches.  Psychiatric/Behavioral:  Negative for hallucinations.     Updated Vital Signs BP (!) 149/78   Pulse (!) 59   Temp 98.1 F (36.7 C) (Oral)   Resp 15   Ht 5' 10 (1.778 m)   Wt 86.2 kg   SpO2 93%   BMI 27.26 kg/m   Physical Exam Vitals and nursing note reviewed.  Constitutional:       Appearance: He is well-developed.  HENT:     Head: Normocephalic.     Nose: Nose normal.  Eyes:     General: No scleral icterus.    Conjunctiva/sclera: Conjunctivae normal.  Neck:     Thyroid : No thyromegaly.  Cardiovascular:     Rate and Rhythm: Normal rate and regular rhythm.     Heart sounds: No murmur heard.    No friction rub. No gallop.  Pulmonary:     Breath sounds: No stridor. No wheezing or rales.  Chest:     Chest wall: No tenderness.  Abdominal:     General: There is no distension.     Tenderness: There is no abdominal tenderness. There is no rebound.  Musculoskeletal:        General: Normal range of motion.     Cervical back: Neck supple.  Lymphadenopathy:     Cervical: No cervical adenopathy.  Skin:    Findings: No erythema or rash.  Neurological:     Mental Status: He is alert and oriented to person, place, and time.     Motor: No abnormal muscle tone.     Coordination: Coordination normal.  Psychiatric:        Behavior: Behavior normal.     (all labs ordered are listed, but only abnormal results are displayed) Labs Reviewed  BASIC METABOLIC PANEL WITH GFR - Abnormal; Notable for the following components:      Result Value   Sodium 134 (*)    Chloride 95 (*)    Glucose, Bld 137 (*)    BUN 73 (*)    Creatinine, Ser 1.86 (*)    GFR, Estimated 33 (*)    All other components within normal limits  CBC - Abnormal; Notable for the following components:   RBC 3.52 (*)    Hemoglobin 12.4 (*)    HCT 36.5 (*)    MCV 103.7 (*)    MCH 35.2 (*)    Platelets 129 (*)    All other components within normal limits  BRAIN NATRIURETIC PEPTIDE - Abnormal; Notable for the following components:   B Natriuretic Peptide 544.0 (*)    All other components within normal limits  HEPATIC FUNCTION PANEL - Abnormal; Notable for the following components:   Total Bilirubin 1.5 (*)    Bilirubin, Direct 0.3 (*)    Indirect Bilirubin 1.2 (*)    All other components within normal  limits  URINALYSIS, W/ REFLEX TO CULTURE (INFECTION SUSPECTED) - Abnormal; Notable for the following components:   Leukocytes,Ua MODERATE (*)    All other components within normal limits  URINE CULTURE    EKG: EKG Interpretation Date/Time:  Saturday January 10 2024 08:18:29 EDT Ventricular Rate:  61 PR Interval:  148 QRS Duration:  136 QT Interval:  472 QTC Calculation: 476  R Axis:   -33  Text Interpretation: Ectopic atrial rhythm Left bundle branch block Confirmed by Suzette Pac [phone]) on 01/10/2024 10:57:00 AM  Radiology: CT Head Wo Contrast Result Date: 01/10/2024 CLINICAL DATA:  Memory loss. EXAM: CT HEAD WITHOUT CONTRAST TECHNIQUE: Contiguous axial images were obtained from the base of the skull through the vertex without intravenous contrast. RADIATION DOSE REDUCTION: This exam was performed according to the departmental dose-optimization program which includes automated exposure control, adjustment of the mA and/or kV according to patient size and/or use of iterative reconstruction technique. COMPARISON:  06/07/2018 FINDINGS: Brain: There is no evidence for acute hemorrhage, hydrocephalus, mass lesion, or abnormal extra-axial fluid collection. No definite CT evidence for acute infarction. Diffuse loss of parenchymal volume is consistent with atrophy. Patchy low attenuation in the deep hemispheric and periventricular white matter is nonspecific, but likely reflects chronic microvascular ischemic demyelination. Vascular: No hyperdense vessel or unexpected calcification. Skull: Normal. Negative for fracture or focal lesion. Sinuses/Orbits: The visualized paranasal sinuses and mastoid air cells are clear. Visualized portions of the globes and intraorbital fat are unremarkable. Other: None. IMPRESSION: 1. No acute intracranial abnormality. 2. Atrophy with chronic small vessel ischemic disease. Electronically Signed   By: Camellia Candle M.D.   On: 01/10/2024 09:08   DG Chest Port 1  View Result Date: 01/10/2024 CLINICAL DATA:  Shortness of breath. EXAM: PORTABLE CHEST 1 VIEW COMPARISON:  12/30/2019 FINDINGS: The cardiopericardial silhouette is within normal limits for size. There is pulmonary vascular congestion without overt pulmonary edema. Bibasilar atelectasis or infiltrate noted,, left greater than right with small left pleural effusion. Bones are diffusely demineralized. Telemetry leads overlie the chest. IMPRESSION: Bibasilar atelectasis or infiltrate, left greater than right with small left pleural effusion. Electronically Signed   By: Camellia Candle M.D.   On: 01/10/2024 08:59     Procedures   Medications Ordered in the ED  cefTRIAXone  (ROCEPHIN ) 2 g in sodium chloride  0.9 % 100 mL IVPB (0 g Intravenous Stopped 01/10/24 1125)                                    Medical Decision Making Amount and/or Complexity of Data Reviewed Labs: ordered. Radiology: ordered.  Risk Prescription drug management.   Patient with urinary tract infection.  He will be sent home with antibiotics.  He also will start back on his diuretic so he is taking 1 pill every other day with his potassium.  Patient will follow-up with PCP     Final diagnoses:  Peripheral edema  Leg edema  Acute cystitis with hematuria    ED Discharge Orders          Ordered    cephALEXin  (KEFLEX ) 500 MG capsule  4 times daily        01/10/24 1104               Suzette Pac, MD 01/10/24 1720

## 2024-01-10 NOTE — ED Triage Notes (Signed)
 Pt arrived via [facility] from home with c/o SOB x 2 days and bilateral edema to legs increasing from feet. Pt is on Eliquis . Pt stopped diuretics d/t kidney function but has take it Sat, [place], Fri. Daughter at bedside and POA. 20 g LFA from [facility].

## 2024-01-12 LAB — URINE CULTURE

## 2024-01-14 DIAGNOSIS — E113293 Type 2 diabetes mellitus with mild nonproliferative diabetic retinopathy without macular edema, bilateral: Secondary | ICD-10-CM | POA: Diagnosis not present

## 2024-01-16 DIAGNOSIS — I5033 Acute on chronic diastolic (congestive) heart failure: Secondary | ICD-10-CM | POA: Diagnosis not present

## 2024-01-16 DIAGNOSIS — N289 Disorder of kidney and ureter, unspecified: Secondary | ICD-10-CM | POA: Diagnosis not present

## 2024-01-23 DIAGNOSIS — N289 Disorder of kidney and ureter, unspecified: Secondary | ICD-10-CM | POA: Diagnosis not present

## 2024-01-23 DIAGNOSIS — I5033 Acute on chronic diastolic (congestive) heart failure: Secondary | ICD-10-CM | POA: Diagnosis not present

## 2024-02-15 ENCOUNTER — Emergency Department (HOSPITAL_COMMUNITY)

## 2024-02-15 ENCOUNTER — Emergency Department (HOSPITAL_COMMUNITY)
Admission: EM | Admit: 2024-02-15 | Discharge: 2024-02-15 | Disposition: A | Discharge status: Home / Self Care | Attending: Emergency Medicine | Admitting: Emergency Medicine

## 2024-02-15 ENCOUNTER — Other Ambulatory Visit: Payer: Self-pay | Admitting: Cardiology

## 2024-02-15 ENCOUNTER — Other Ambulatory Visit: Payer: Self-pay

## 2024-02-15 ENCOUNTER — Encounter (HOSPITAL_COMMUNITY): Payer: Self-pay | Admitting: *Deleted

## 2024-02-15 DIAGNOSIS — E1122 Type 2 diabetes mellitus with diabetic chronic kidney disease: Secondary | ICD-10-CM | POA: Diagnosis not present

## 2024-02-15 DIAGNOSIS — L03113 Cellulitis of right upper limb: Secondary | ICD-10-CM | POA: Diagnosis not present

## 2024-02-15 DIAGNOSIS — Z7901 Long term (current) use of anticoagulants: Secondary | ICD-10-CM | POA: Insufficient documentation

## 2024-02-15 DIAGNOSIS — Z7984 Long term (current) use of oral hypoglycemic drugs: Secondary | ICD-10-CM | POA: Diagnosis not present

## 2024-02-15 DIAGNOSIS — Z79899 Other long term (current) drug therapy: Secondary | ICD-10-CM | POA: Diagnosis not present

## 2024-02-15 DIAGNOSIS — R0989 Other specified symptoms and signs involving the circulatory and respiratory systems: Secondary | ICD-10-CM | POA: Diagnosis not present

## 2024-02-15 DIAGNOSIS — R2231 Localized swelling, mass and lump, right upper limb: Secondary | ICD-10-CM | POA: Diagnosis present

## 2024-02-15 DIAGNOSIS — I2510 Atherosclerotic heart disease of native coronary artery without angina pectoris: Secondary | ICD-10-CM | POA: Insufficient documentation

## 2024-02-15 DIAGNOSIS — M7989 Other specified soft tissue disorders: Secondary | ICD-10-CM | POA: Diagnosis not present

## 2024-02-15 DIAGNOSIS — I130 Hypertensive heart and chronic kidney disease with heart failure and stage 1 through stage 4 chronic kidney disease, or unspecified chronic kidney disease: Secondary | ICD-10-CM | POA: Insufficient documentation

## 2024-02-15 DIAGNOSIS — R0602 Shortness of breath: Secondary | ICD-10-CM | POA: Diagnosis not present

## 2024-02-15 DIAGNOSIS — I509 Heart failure, unspecified: Secondary | ICD-10-CM | POA: Diagnosis not present

## 2024-02-15 DIAGNOSIS — N189 Chronic kidney disease, unspecified: Secondary | ICD-10-CM | POA: Insufficient documentation

## 2024-02-15 DIAGNOSIS — R918 Other nonspecific abnormal finding of lung field: Secondary | ICD-10-CM | POA: Diagnosis not present

## 2024-02-15 DIAGNOSIS — I517 Cardiomegaly: Secondary | ICD-10-CM | POA: Diagnosis not present

## 2024-02-15 LAB — CBC WITH DIFFERENTIAL/PLATELET
Abs Immature Granulocytes: 0.02 K/uL (ref 0.00–0.07)
Basophils Absolute: 0 K/uL (ref 0.0–0.1)
Basophils Relative: 0 %
Eosinophils Absolute: 0.1 K/uL (ref 0.0–0.5)
Eosinophils Relative: 1 %
HCT: 37.3 % — ABNORMAL LOW (ref 39.0–52.0)
Hemoglobin: 12.7 g/dL — ABNORMAL LOW (ref 13.0–17.0)
Immature Granulocytes: 0 %
Lymphocytes Relative: 18 %
Lymphs Abs: 1 K/uL (ref 0.7–4.0)
MCH: 34.6 pg — ABNORMAL HIGH (ref 26.0–34.0)
MCHC: 34 g/dL (ref 30.0–36.0)
MCV: 101.6 fL — ABNORMAL HIGH (ref 80.0–100.0)
Monocytes Absolute: 0.6 K/uL (ref 0.1–1.0)
Monocytes Relative: 11 %
Neutro Abs: 3.9 K/uL (ref 1.7–7.7)
Neutrophils Relative %: 70 %
Platelets: 152 K/uL (ref 150–400)
RBC: 3.67 MIL/uL — ABNORMAL LOW (ref 4.22–5.81)
RDW: 15.2 % (ref 11.5–15.5)
WBC: 5.6 K/uL (ref 4.0–10.5)
nRBC: 0 % (ref 0.0–0.2)

## 2024-02-15 LAB — BASIC METABOLIC PANEL WITH GFR
Anion gap: 17 — ABNORMAL HIGH (ref 5–15)
BUN: 108 mg/dL — ABNORMAL HIGH (ref 8–23)
CO2: 28 mmol/L (ref 22–32)
Calcium: 9.6 mg/dL (ref 8.9–10.3)
Chloride: 86 mmol/L — ABNORMAL LOW (ref 98–111)
Creatinine, Ser: 2.26 mg/dL — ABNORMAL HIGH (ref 0.61–1.24)
GFR, Estimated: 26 mL/min — ABNORMAL LOW (ref >60)
Glucose, Bld: 213 mg/dL — ABNORMAL HIGH (ref 70–99)
Potassium: 3.5 mmol/L (ref 3.5–5.1)
Sodium: 131 mmol/L — ABNORMAL LOW (ref 135–145)

## 2024-02-15 LAB — BRAIN NATRIURETIC PEPTIDE: B Natriuretic Peptide: 531 pg/mL — ABNORMAL HIGH (ref 0.0–100.0)

## 2024-02-15 MED ORDER — DOXYCYCLINE HYCLATE 100 MG PO TABS
100.0000 mg | ORAL_TABLET | Freq: Once | ORAL | Status: AC
  Administered 2024-02-15: 100 mg via ORAL
  Filled 2024-02-15: qty 1

## 2024-02-15 MED ORDER — DOXYCYCLINE HYCLATE 100 MG PO CAPS: 100.0000 mg | ORAL_CAPSULE | Freq: Two times a day (BID) | ORAL | 0 refills | Status: DC

## 2024-02-15 NOTE — Discharge Instructions (Signed)
 You are being placed on an antibiotic to treat you for a possible skin infection of your right arm, but with the degree of swelling we also want to make sure you do not have a blood clot in the blood vessels of that arm.  I have ordered an ultrasound of your arm.  Call the scheduling phone number first thing tomorrow morning and schedule a time to come back to have this completed.  You can come to our emergency room but do not register as an emergency room patient, let them know you are here for your scheduled arm ultrasound.  You will be advised of your ultrasound results by 1 of us  before you leave tomorrow.  If your ultrasound is negative I recommend completing the entire course of your antibiotics and advise close follow-up with your primary provider for recheck of your symptoms if they are not improving with the antibiotic.

## 2024-02-15 NOTE — ED Triage Notes (Signed)
 Pt with right arm swelling for a week, pt believes he was bit by a spider. Right hand swelling for longer. Redness noted.

## 2024-02-16 ENCOUNTER — Ambulatory Visit (HOSPITAL_COMMUNITY)
Admission: RE | Admit: 2024-02-16 | Discharge: 2024-02-16 | Disposition: A | Discharge status: Home / Self Care | Source: Ambulatory Visit | Attending: Emergency Medicine | Admitting: Emergency Medicine

## 2024-02-16 DIAGNOSIS — R600 Localized edema: Secondary | ICD-10-CM | POA: Insufficient documentation

## 2024-02-16 DIAGNOSIS — M7989 Other specified soft tissue disorders: Secondary | ICD-10-CM | POA: Diagnosis not present

## 2024-02-16 NOTE — ED Provider Notes (Signed)
 [place] [facility] DEPARTMENT AT [facility] Provider Note   CSN: [id number] Arrival date & time: 02/15/24  1420     Patient presents with: Arm Swelling   William Walker is a 89 y.o. male with a history including hypertension, CAD, peripheral vascular disease, type 2 diabetes, CHF, chronic kidney disease presenting for evaluation of swelling and redness of his right arm.  He initially noted a lesion on his upper arm which he suspected to be a spider bite which he woke with.  Since then he has had increasing swelling and redness distal to this bite site.  He also endorses weeping of clear fluid from the area most edematous at his volar proximal forearm.  He denies fevers or chills.  He does endorse swelling in his right hand but states this is more of a chronic issue and not significantly worsened with this new complaint.  He denies chest pain, he does endorse shortness of breath, but not necessarily worse than his baseline.  He has had no treatment for his arm complaints prior to arrival.   The history is provided by the patient and a relative (son at bedside).       Prior to Admission medications   Medication Sig Start Date End Date Taking? Authorizing Provider  doxycycline  (VIBRAMYCIN ) 100 MG capsule Take 1 capsule (100 mg total) by mouth 2 (two) times daily. 02/15/24  Yes Erasto Sleight, PA-C  apixaban  (ELIQUIS ) 2.5 MG TABS tablet TAKE 1 TABLET BY MOUTH TWICE  DAILY 10/08/23   Debera Jayson MATSU, MD  clindamycin (CLEOCIN) 300 MG capsule Take 900 mg by mouth See admin instructions. Prior to dental work    [provider]  glipiZIDE  (GLUCOTROL ) 5 MG tablet Take 1 tablet (5 mg total) by mouth daily before breakfast. Patient taking differently: Take 5 mg by mouth 2 (two) times daily before a meal. 12/15/19   Angiulli, Toribio PARAS, PA-C  levothyroxine  (SYNTHROID ) 75 MCG tablet Take 125 mcg by mouth daily before breakfast.    [provider]  metolazone (ZAROXOLYN) 2.5 MG  tablet Take 2.5 mg by mouth daily.    [provider]  metoprolol  succinate (TOPROL -XL) 25 MG 24 hr tablet TAKE ONE-HALF TABLET BY MOUTH  DAILY 06/05/23   Debera Jayson MATSU, MD  Multiple Vitamin (MULTIVITAMIN WITH MINERALS) TABS tablet Take 1 tablet by mouth daily. 12/08/19   Gold, Wayne E, PA-C  pantoprazole  (PROTONIX ) 40 MG tablet Take 1 tablet (40 mg total) by mouth daily. 12/15/19   Angiulli, Toribio PARAS, PA-C  Polyethyl Glycol-Propyl Glycol (SYSTANE) 0.4-0.3 % SOLN Place 1 drop into both eyes daily as needed (dry eyes).    [provider]  potassium chloride  SA (KLOR-CON  M) 20 MEQ tablet TAKE 1 AND 1/2 TABLETS BY MOUTH  DAILY Patient taking differently: Take 20 mEq by mouth daily. 07/08/23   Debera Jayson MATSU, MD  pravastatin  (PRAVACHOL ) 40 MG tablet TAKE 1 TABLET BY MOUTH IN THE  EVENING 07/08/23   Debera Jayson MATSU, MD  torsemide  (DEMADEX ) 20 MG tablet TAKE 2 TABLETS BY MOUTH DAILY  ALTERNATING WITH 1 TABLET BY  MOUTH DAILY Patient taking differently: Take 20 mg by mouth daily. 12/16/23   Debera Jayson MATSU, MD  triamcinolone cream (KENALOG) 0.1 % SMARTSIG:Topical 1-2 Times Daily PRN 09/08/22   [provider]    Allergies: Januvia [sitagliptin]    Review of Systems  Constitutional:  Negative for chills and fever.  HENT:  Negative for congestion and sore throat.  Eyes: Negative.   Respiratory:  Positive for shortness of breath. Negative for chest tightness.   Cardiovascular:  Negative for chest pain.  Gastrointestinal:  Negative for abdominal pain and nausea.  Genitourinary: Negative.   Musculoskeletal:  Negative for arthralgias, joint swelling and neck pain.  Skin:  Positive for color change and wound. Negative for rash.  Neurological:  Negative for dizziness, weakness, light-headedness, numbness and headaches.  Psychiatric/Behavioral: Negative.      Updated Vital Signs BP (!) 155/65   Pulse 62   Temp 98 F (36.7 C)   Resp 17   Ht 5' 10 (1.778 m)   Wt 86.2  kg   SpO2 97%   BMI 27.27 kg/m   Physical Exam Vitals and nursing note reviewed.  Constitutional:      Appearance: He is well-developed.  HENT:     Head: Normocephalic and atraumatic.  Eyes:     Conjunctiva/sclera: Conjunctivae normal.  Cardiovascular:     Rate and Rhythm: Normal rate and regular rhythm.     Heart sounds: Normal heart sounds.     Comments: Radial pulses are intact. Pulmonary:     Effort: Pulmonary effort is normal.     Breath sounds: Normal breath sounds. No wheezing, rhonchi or rales.  Abdominal:     General: Bowel sounds are normal.     Palpations: Abdomen is soft.     Tenderness: There is no abdominal tenderness.  Musculoskeletal:        General: Swelling present. No tenderness. Normal range of motion.     Cervical back: Normal range of motion.  Skin:    General: Skin is warm and dry.     Findings: Erythema present.     Comments: Right arm erythema which i abruptly ends mid upper arm, suggesting possible changes from sun exposure as pattern is consistent with a short sleeve transition line, however left arm does not have the same significant transition line.  There is an abrasion/skin tear like lesion approximately 1 cm in diameter at his right lateral distal upper arm which he feels is the spider bite site.  He has soft edema of the forearm with fine traces of serous leakage through the skin.  Nontender, no induration or localizing fluctuance.  He has full range of motion of the elbow without pain.  Neurological:     Mental Status: He is alert.     (all labs ordered are listed, but only abnormal results are displayed) Labs Reviewed  CBC WITH DIFFERENTIAL/PLATELET - Abnormal; Notable for the following components:      Result Value   RBC 3.67 (*)    Hemoglobin 12.7 (*)    HCT 37.3 (*)    MCV 101.6 (*)    MCH 34.6 (*)    All other components within normal limits  BASIC METABOLIC PANEL WITH GFR - Abnormal; Notable for the following components:   Sodium  131 (*)    Chloride 86 (*)    Glucose, Bld 213 (*)    BUN 108 (*)    Creatinine, Ser 2.26 (*)    GFR, Estimated 26 (*)    Anion gap 17 (*)    All other components within normal limits  BRAIN NATRIURETIC PEPTIDE - Abnormal; Notable for the following components:   B Natriuretic Peptide 531.0 (*)    All other components within normal limits    EKG: EKG Interpretation Date/Time:  Sunday February 15 2024 18:54:04 EDT Ventricular Rate:  59 PR Interval:  107 QRS Duration:  143 QT Interval:  480 QTC Calculation: 476 R Axis:   73  Text Interpretation: Sinus or ectopic atrial rhythm Short PR interval Nonspecific intraventricular conduction delay Borderline T abnormalities, lateral leads Confirmed by Suzette Pac [phone]) on 02/15/2024 6:58:36 PM  Radiology: US  Venous Img Upper Uni Right Result Date: 02/16/2024 CLINICAL DATA:  Right upper extremity swelling EXAM: Right UPPER EXTREMITY VENOUS DOPPLER ULTRASOUND TECHNIQUE: Gray-scale sonography with graded compression, as well as color Doppler and duplex ultrasound were performed to evaluate the upper extremity deep venous system from the level of the subclavian vein and including the jugular, axillary, basilic, radial, ulnar and upper cephalic vein. Spectral Doppler was utilized to evaluate flow at rest and with distal augmentation maneuvers. COMPARISON:  None Available. FINDINGS: Contralateral Subclavian Vein: Respiratory phasicity is normal and symmetric with the symptomatic side. No evidence of thrombus. Normal compressibility. Internal Jugular Vein: No evidence of thrombus. Normal compressibility, respiratory phasicity and response to augmentation. Subclavian Vein: No evidence of thrombus. Normal compressibility, respiratory phasicity and response to augmentation. Axillary Vein: No evidence of thrombus. Normal compressibility, respiratory phasicity and response to augmentation. Cephalic Vein: No evidence of thrombus. Normal compressibility,  respiratory phasicity and response to augmentation. Basilic Vein: No evidence of thrombus. Normal compressibility, respiratory phasicity and response to augmentation. Brachial Veins: No evidence of thrombus. Normal compressibility, respiratory phasicity and response to augmentation. Radial Veins: No evidence of thrombus. Normal compressibility, respiratory phasicity and response to augmentation. Ulnar Veins: No evidence of thrombus. Normal compressibility, respiratory phasicity and response to augmentation. Venous Reflux:  None visualized. Other Findings: Diffuse soft tissue swelling throughout the right upper extremity, predominantly distal to the elbow. IMPRESSION: No evidence of DVT within the right upper extremity. Soft tissue edema noted. Electronically Signed   By: Megan  Zare M.D.   On: 02/16/2024 12:56   DG Chest Portable 1 View Result Date: 02/15/2024 CLINICAL DATA:  Shortness of breath. EXAM: PORTABLE CHEST 1 VIEW COMPARISON:  Chest radiograph dated 01/10/2024. FINDINGS: Cardiomegaly with vascular congestion. Small left and possibly trace right pleural effusions. There are bibasilar atelectasis. Pneumonia is not excluded. No pneumothorax. Median sternotomy wires and mechanical cardiac valve. No acute osseous pathology. IMPRESSION: Cardiomegaly with vascular congestion and small left and possibly trace right pleural effusions. Electronically Signed   By: Vanetta Chou M.D.   On: 02/15/2024 19:03     Procedures   Medications Ordered in the ED  doxycycline  (VIBRA -TABS) tablet 100 mg (100 mg Oral Given 02/15/24 1938)                                    Medical Decision Making Patient presenting with a 1 week history of right upper arm edema and erythema.  This could represent a cellulitis and is therefore being covered with antibiotics, first dose of doxycycline  was given here.  Discussed returning for an ultrasound of the arm to rule out DVT given the degree of edema in the arm.  Patient and  son understand plan, will call in the morning for an appointment time.  Labs and imaging are otherwise relatively stable.  X-ray and labs obtained to rule out significant leukocytosis and to assess for any acute decompensation regarding CHF as he had complaints of shortness of breath.  However he is comfortable with his breathing here, no tachypnea, no hypoxia.  Labs as outlined below are stable.  Amount and/or Complexity of Data Reviewed Labs: ordered.  Details: BNP is elevated at 531 which is consistent with his baseline.  Be met was also obtained and reviewed, glucose today is 213, he has a BUN of 108 and a creatinine of 2.26, this is near his baseline with his chronic kidney disease. Radiology: ordered.    Details: Chest x-ray reviewed, stable. ECG/medicine tests: ordered.    Details: Rate 59 sinus rhythm, short PR, nonspecific IVCD, essentially unchanged.  Risk Prescription drug management.        Final diagnoses:  Swelling of arm  Cellulitis of right upper extremity    ED Discharge Orders          Ordered    doxycycline  (VIBRAMYCIN ) 100 MG capsule  2 times daily        02/15/24 2020    US  Venous Img Upper Uni Right        02/15/24 2021               Birdena Clarity, PA-C 02/16/24 1331    Suzette Pac, MD 02/16/24 [phone]

## 2024-03-01 ENCOUNTER — Encounter: Payer: Self-pay | Admitting: Cardiology

## 2024-03-01 ENCOUNTER — Ambulatory Visit: Attending: Cardiology | Admitting: Cardiology

## 2024-03-01 VITALS — BP 126/60 | HR 58 | Ht 71.0 in | Wt 198.0 lb

## 2024-03-01 DIAGNOSIS — Z953 Presence of xenogenic heart valve: Secondary | ICD-10-CM | POA: Diagnosis not present

## 2024-03-01 DIAGNOSIS — I5032 Chronic diastolic (congestive) heart failure: Secondary | ICD-10-CM

## 2024-03-01 DIAGNOSIS — I6523 Occlusion and stenosis of bilateral carotid arteries: Secondary | ICD-10-CM | POA: Diagnosis not present

## 2024-03-01 DIAGNOSIS — I25119 Atherosclerotic heart disease of native coronary artery with unspecified angina pectoris: Secondary | ICD-10-CM | POA: Diagnosis not present

## 2024-03-01 DIAGNOSIS — I483 Typical atrial flutter: Secondary | ICD-10-CM | POA: Diagnosis not present

## 2024-03-01 NOTE — Progress Notes (Signed)
 Cardiology Office Note  Date: 03/01/2024   ID: William Walker, DOB 10-Dec-1926, MRN [id number]  History of Present Illness: William Walker is a 89 y.o. male last seen in September 2024.  He is here today with his daughter for a follow-up visit.  He has had recent treatment for UTI and also had a spider bite on his right arm with transient swelling.  I went over his medications.  At this point he is taking Demadex  20 mg daily with potassium supplement, also Zaroxolyn 2.5 mg daily.  Without diuretics he has significant fluid retention.  Recent lab work shows creatinine 2.26 with GFR 26.  I reviewed his ECG today which shows typical atrial flutter with 3:1 AV block and IVCD.  He remains on Eliquis  2.5 mg twice daily for stroke prophylaxis.  Also Toprol -XL 12.5 mg daily for now.  Physical Exam: VS:  BP 126/60 (BP Location: Right Arm, Cuff Size: Normal)   Pulse (!) 58   Ht 5' 11 (1.803 m)   Wt 198 lb (89.8 kg)   SpO2 (!) 58%   BMI 27.62 kg/m , BMI Body mass index is 27.62 kg/m.  Wt Readings from Last 3 Encounters:  03/01/24 198 lb (89.8 kg)  02/15/24 190 lb 0.6 oz (86.2 kg)  01/10/24 190 lb (86.2 kg)    General: Patient appears comfortable at rest. HEENT: Conjunctiva and lids normal. Neck: Supple, no elevated JVP or carotid bruits. Lungs: Clear to auscultation, nonlabored breathing at rest. Cardiac: Regular rate and rhythm, no S3, 2/6 systolic murmur, no pericardial rub.  ECG:  An ECG dated 02/15/2024 was personally reviewed today and demonstrated:  Typical atrial flutter with 3:1 AV block, incomplete right bundle branch block.  Labwork: 01/10/2024: ALT 22; AST 28 02/15/2024: B Natriuretic Peptide 531.0; BUN 108; Creatinine, Ser 2.26; Hemoglobin 12.7; Platelets 152; Potassium 3.5; Sodium 131     Component Value Date/Time   CHOL 166 11/16/2019 0026   CHOL 179 05/05/2018 1001   TRIG 88 11/16/2019 0026   TRIG 134 05/05/2018 1001   HDL 47 11/16/2019 0026   HDL 54 05/05/2018 1001    CHOLHDL 3.5 11/16/2019 0026   VLDL 18 11/16/2019 0026   LDLCALC 101 (H) 11/16/2019 0026   Other Studies Reviewed Today:  No interval cardiac testing for review today.  Assessment and Plan:  1.  Multivessel CAD with remote stent intervention to the LAD and more recently CABG in June 2021 with LIMA to LAD, SVG to RCA, and SVG to ramus intermedius and first obtuse marginal.  No angina at low-level activity.  Continue Pravachol  40 mg daily.   2.  History of severe degenerative calcific aortic stenosis status post 23 mm Inspiris bioprosthetic AVR at the time of CABG in June 2021.  Echocardiogram in June 2022 revealed mean AV gradient 6 mmHg with mild aortic regurgitation.  Continue observation for now, no change in cardiac murmur.   3.  Persistent typical atrial flutter with variable AV block and CHA2DS2-VASc score of 6.  He is asymptomatic.  Continue to track heart rate, he remains on Toprol -XL 12.5 mg daily, may ultimately need to discontinue this.  Otherwise on Eliquis  2.5 mg twice daily for stroke prophylaxis.   4.  HFpEF, LVEF 55 to 60% with restrictive diastolic filling pattern and mildly decreased RV contraction by echocardiogram in June 2022.  Continue Demadex  20 mg daily with potassium supplement, also Zaroxolyn 2.5 mg daily.   5.  CKD stage IV.  Creatinine 2.26 with  GFR 26.   6.  Asymptomatic carotid artery disease, occluded LICA and 70 to 99% RICA stenosis by carotid Dopplers in September 2024.  He declines further intervention and surveillance imaging per discussion.  Continue Pravachol  40 mg daily.  Disposition:  Follow up 3 months.  Signed, Jayson JUDITHANN Sierras, M.D., F.A.C.C. [facility]

## 2024-03-01 NOTE — Patient Instructions (Signed)
Medication Instructions:  ?Your physician recommends that you continue on your current medications as directed. Please refer to the Current Medication list given to you today. ? ? ?Labwork: ?None today ? ?Testing/Procedures: ?None today ? ?Follow-Up: ?3 months ? ?Any Other Special Instructions Will Be Listed Below (If Applicable). ? ?If you need a refill on your cardiac medications before your next appointment, please call your pharmacy. ? ?

## 2024-03-02 ENCOUNTER — Telehealth: Payer: Self-pay | Admitting: Cardiology

## 2024-03-02 NOTE — Telephone Encounter (Signed)
 I spoke with Shona at [facility] and she stated William Walker has an open PT with patient since 02/23/24 and they wanted verbal permission to continue.Dr.mcDowell approves, Shona will relay this to William Walker

## 2024-03-02 NOTE — Telephone Encounter (Signed)
 Era with [facility] called in requesting a referral be put in Epic for physical therapy. She states the pt told her Dr. Debera was okay with it yesterday. Please advise.

## 2024-03-04 ENCOUNTER — Encounter: Payer: Self-pay | Admitting: Cardiology

## 2024-03-12 ENCOUNTER — Telehealth: Payer: Self-pay | Admitting: Cardiology

## 2024-03-12 NOTE — Telephone Encounter (Signed)
 Amy notified

## 2024-03-12 NOTE — Telephone Encounter (Signed)
 Physical Therapist with [facility] needs verbal approval for 2x week for 4 weeks, 1x week 1 week. She would like to know if they can add occupational therapy for ADLs as well. Please advise.

## 2024-03-13 ENCOUNTER — Other Ambulatory Visit: Payer: Self-pay | Admitting: Cardiology

## 2024-03-23 ENCOUNTER — Ambulatory Visit: Admitting: Physician Assistant

## 2024-03-23 ENCOUNTER — Ambulatory Visit (HOSPITAL_COMMUNITY)
Admission: RE | Admit: 2024-03-23 | Discharge: 2024-03-23 | Disposition: A | Source: Ambulatory Visit | Attending: Physician Assistant

## 2024-03-23 VITALS — BP 131/76 | HR 64 | Temp 98.1°F | Ht 71.0 in | Wt 201.2 lb

## 2024-03-23 DIAGNOSIS — R350 Frequency of micturition: Secondary | ICD-10-CM

## 2024-03-23 DIAGNOSIS — Z23 Encounter for immunization: Secondary | ICD-10-CM | POA: Diagnosis not present

## 2024-03-23 DIAGNOSIS — E1165 Type 2 diabetes mellitus with hyperglycemia: Secondary | ICD-10-CM

## 2024-03-23 DIAGNOSIS — N401 Enlarged prostate with lower urinary tract symptoms: Secondary | ICD-10-CM | POA: Diagnosis not present

## 2024-03-23 DIAGNOSIS — I10 Essential (primary) hypertension: Secondary | ICD-10-CM

## 2024-03-23 DIAGNOSIS — E785 Hyperlipidemia, unspecified: Secondary | ICD-10-CM

## 2024-03-23 DIAGNOSIS — S99921A Unspecified injury of right foot, initial encounter: Secondary | ICD-10-CM | POA: Insufficient documentation

## 2024-03-23 DIAGNOSIS — G4720 Circadian rhythm sleep disorder, unspecified type: Secondary | ICD-10-CM | POA: Diagnosis not present

## 2024-03-23 DIAGNOSIS — Z7689 Persons encountering health services in other specified circumstances: Secondary | ICD-10-CM

## 2024-03-23 DIAGNOSIS — L03116 Cellulitis of left lower limb: Secondary | ICD-10-CM | POA: Diagnosis not present

## 2024-03-23 DIAGNOSIS — L03115 Cellulitis of right lower limb: Secondary | ICD-10-CM

## 2024-03-23 DIAGNOSIS — Z7984 Long term (current) use of oral hypoglycemic drugs: Secondary | ICD-10-CM

## 2024-03-23 DIAGNOSIS — R531 Weakness: Secondary | ICD-10-CM | POA: Diagnosis not present

## 2024-03-23 DIAGNOSIS — R5381 Other malaise: Secondary | ICD-10-CM

## 2024-03-23 MED ORDER — CEPHALEXIN 500 MG PO CAPS: 500.0000 mg | ORAL_CAPSULE | Freq: Three times a day (TID) | ORAL | 0 refills | Status: DC

## 2024-03-23 MED ORDER — TRAZODONE HCL 50 MG PO TABS: 25.0000 mg | ORAL_TABLET | Freq: Every evening | ORAL | 3 refills | Status: AC | PRN

## 2024-03-23 MED ORDER — TAMSULOSIN HCL 0.4 MG PO CAPS: 0.4000 mg | ORAL_CAPSULE | Freq: Every day | ORAL | 3 refills | Status: DC

## 2024-03-23 NOTE — Progress Notes (Signed)
 New Patient Office Visit  Subjective    Patient ID: William Walker, male    DOB: 1927-01-12  Age: 89 y.o. MRN: [id number]  CC:  Chief Complaint  Patient presents with   Foot Injury    Pt. Fell last week. Foot is swollen, red. Pt. Not sleeping at all at night. Gets into bed at 10:30 but will not fall asleep til 4:00 a.m. Pt. Daughter is requesting a hospital bed for Father and Mother.  Pt. Has excessive pee throughout the day and night.     HPI William Walker presents to establish care ***  Outpatient Encounter Medications as of 03/23/2024  Medication Sig   cephALEXin  (KEFLEX ) 500 MG capsule Take 1 capsule (500 mg total) by mouth 3 (three) times daily for 10 days.   tamsulosin (FLOMAX) 0.4 MG CAPS capsule Take 1 capsule (0.4 mg total) by mouth daily.   traZODone (DESYREL) 50 MG tablet Take 0.5-1 tablets (25-50 mg total) by mouth at bedtime as needed for sleep.   acetaminophen  (TYLENOL ) 325 MG tablet Take 650 mg by mouth every 6 (six) hours as needed.   apixaban  (ELIQUIS ) 2.5 MG TABS tablet TAKE 1 TABLET BY MOUTH TWICE  DAILY   clindamycin (CLEOCIN) 300 MG capsule Take 900 mg by mouth See admin instructions. Prior to dental work   glipiZIDE  (GLUCOTROL ) 5 MG tablet Take 5 mg by mouth 2 (two) times daily before a meal.   hydrOXYzine (ATARAX) 10 MG tablet Take 10 mg by mouth at bedtime.   ibuprofen (ADVIL) 200 MG tablet Take 200 mg by mouth every 6 (six) hours as needed.   levothyroxine  (SYNTHROID ) 75 MCG tablet Take 125 mcg by mouth daily before breakfast.   loratadine (CLARITIN) 10 MG tablet Take 10 mg by mouth daily.   metolazone (ZAROXOLYN) 2.5 MG tablet Take 2.5 mg by mouth daily.   metoprolol  succinate (TOPROL -XL) 25 MG 24 hr tablet TAKE ONE-HALF TABLET BY MOUTH  DAILY   Multiple Vitamin (MULTIVITAMIN WITH MINERALS) TABS tablet Take 1 tablet by mouth daily.   Multiple Vitamins-Minerals (PRESERVISION AREDS) TABS Take by mouth.   pantoprazole  (PROTONIX ) 40 MG tablet Take 1 tablet  (40 mg total) by mouth daily.   Polyethyl Glycol-Propyl Glycol (SYSTANE) 0.4-0.3 % SOLN Place 1 drop into both eyes daily as needed (dry eyes).   potassium chloride  SA (KLOR-CON  M) 20 MEQ tablet Take 20 mEq by mouth daily.   pravastatin  (PRAVACHOL ) 40 MG tablet TAKE 1 TABLET BY MOUTH IN THE  EVENING   torsemide  (DEMADEX ) 20 MG tablet TAKE 2 TABLETS BY MOUTH DAILY  ALTERNATING WITH 1 TABLET BY  MOUTH DAILY   [DISCONTINUED] doxycycline  (VIBRAMYCIN ) 100 MG capsule Take 1 capsule (100 mg total) by mouth 2 (two) times daily.   [DISCONTINUED] GEMTESA  75 MG TABS Take 1 tablet by mouth daily.   [DISCONTINUED] triamcinolone cream (KENALOG) 0.1 % SMARTSIG:Topical 1-2 Times Daily PRN (Patient not taking: Reported on 03/01/2024)   No facility-administered encounter medications on file as of 03/23/2024.    Past Medical History:  Diagnosis Date   Aortic stenosis    a. s/p AVR in 11/2019   Arthritis    Coronary atherosclerosis of native coronary artery    a. s/p prior stenting of LAD b. cath in 11/2019 showing LM and 3-vessel CAD--> s/p CABG on 11/22/2019 with LIMA-LAD, SVG-dRCA and seq SVG-RI-OM1 and complicated by post-cardiotomy shock   CVD (cerebrovascular disease)    Essential hypertension    GERD (gastroesophageal reflux disease)  History of kidney stones    Hyperlipidemia    MI (myocardial infarction) [facility])    October 1999   Skin cancer    Type 2 diabetes mellitus with diabetic neuropathy [facility])     Past Surgical History:  Procedure Laterality Date   AORTIC VALVE REPLACEMENT N/A 11/22/2019   Procedure: AORTIC VALVE REPLACEMENT (AVR) USING INSPIRIS ;  Surgeon: Army Dallas NOVAK, MD;  Location: [facility] OR;  Service: Open Heart Surgery;  Laterality: N/A;   Arch cerebral ateriogram  12/11/2005   Krystal MANO Early MD   CATARACT EXTRACTION, BILATERAL     CORONARY ARTERY BYPASS GRAFT N/A 11/22/2019   Procedure: CORONARY ARTERY BYPASS GRAFTING (CABG), ON PUMP, TIMES FOUR , USING LEFT INTERNAL MAMMARY  ARTERY AND ENDOSCOPICALLY HARVESTED RIGHT GREATER SAPHENOUS VEIN;  Surgeon: Army Dallas NOVAK, MD;  Location: [facility] OR;  Service: Open Heart Surgery;  Laterality: N/A;  SEQUENTIAL RAMUS INTERMEDIATE TO OM1 SAPHENOUS VEIN TO DISTAL PDA LIMA TO LAD   INGUINAL HERNIA REPAIR Left 12/09/2012   Procedure: HERNIA REPAIR INGUINAL with mesh;  Surgeon: Oneil DELENA Budge, MD;  Location: [facility] ORS;  Service: General;  Laterality: Left;   INGUINAL HERNIA REPAIR Right 02/15/2021   Procedure: OPEN RIGHT INGUINAL HERNIA REPAIR WITH MESH;  Surgeon: Eletha Krystal, MD;  Location: [facility] ORS;  Service: General;  Laterality: Right;   INSERTION OF MESH Left 12/09/2012   Procedure: INSERTION OF MESH;  Surgeon: Oneil DELENA Budge, MD;  Location: [facility] ORS;  Service: General;  Laterality: Left;   RIGHT/LEFT HEART CATH AND CORONARY ANGIOGRAPHY N/A 10/07/2017   Procedure: RIGHT/LEFT HEART CATH AND CORONARY ANGIOGRAPHY;  Surgeon: Wonda Sharper, MD;  Location: [facility] INVASIVE CV LAB;  Service: Cardiovascular;  Laterality: N/A;   RIGHT/LEFT HEART CATH AND CORONARY ANGIOGRAPHY N/A 11/16/2019   Procedure: RIGHT/LEFT HEART CATH AND CORONARY ANGIOGRAPHY;  Surgeon: Court Dorn PARAS, MD;  Location: [facility] INVASIVE CV LAB;  Service: Cardiovascular;  Laterality: N/A;   TEE WITHOUT CARDIOVERSION N/A 11/22/2019   Procedure: TRANSESOPHAGEAL ECHOCARDIOGRAM (TEE);  Surgeon: Army Dallas NOVAK, MD;  Location: [facility] OR;  Service: Open Heart Surgery;  Laterality: N/A;    Family History  Problem Relation Age of Onset   Coronary artery disease Father     Social History   Socioeconomic History   Marital status: Married    Spouse name: Not on file   Number of children: Not on file   Years of education: Not on file   Highest education level: 12th grade  Occupational History   Not on file  Tobacco Use   Smoking status: Former    Types: Pipe, Cigars    Quit date: 12/03/1948    Years since quitting: 75.3   Smokeless tobacco: Current    Types: Chew   Tobacco comments:     has been chewing tobacco for 60-70 years  Vaping Use   Vaping status: Never Used  Substance and Sexual Activity   Alcohol use: No   Drug use: No   Sexual activity: Not on file  Other Topics Concern   Not on file  Social History Narrative   Not on file   Social Drivers of Health   Financial Resource Strain: Low Risk  (03/23/2024)   Overall Financial Resource Strain (CARDIA)    Difficulty of Paying Living Expenses: Not very hard  Food Insecurity: No Food Insecurity (03/23/2024)   Hunger Vital Sign    Worried About Running Out of Food in the Last Year: Never true    Ran Out  of Food in the Last Year: Never true  Transportation Needs: No Transportation Needs (03/23/2024)   PRAPARE - Administrator, Civil Service (Medical): No    Lack of Transportation (Non-Medical): No  Physical Activity: Inactive (03/23/2024)   Exercise Vital Sign    Days of Exercise per Week: 0 days    Minutes of Exercise per Session: Not on file  Stress: Stress Concern Present (03/23/2024)   [employer] of Occupational Health - Occupational Stress Questionnaire    Feeling of Stress: Very much  Social Connections: Moderately Isolated (03/23/2024)   Social Connection and Isolation Panel    Frequency of Communication with Friends and Family: Patient declined    Frequency of Social Gatherings with Friends and Family: More than three times a week    Attends Religious Services: Patient declined    Database administrator or Organizations: No    Attends Engineer, structural: Not on file    Marital Status: Married  Catering manager Violence: Not on file    Review of Systems  Unable to perform ROS: Age (History provided by daughter, who is his caregiver)  Constitutional:  Negative for fever.  Musculoskeletal:  Positive for falls.      Objective    BP 131/76 (BP Location: Left Arm, Patient Position: Sitting)   Pulse 64   Temp 98.1 F (36.7 C)   Ht 5' 11 (1.803 m)   Wt 201 lb  4 oz (91.3 kg)   BMI 28.07 kg/m   Physical Exam Constitutional:      General: He is not in acute distress.    Appearance: Normal appearance.  HENT:     Head: Normocephalic and atraumatic.  Cardiovascular:     Rate and Rhythm: Normal rate and regular rhythm.     Heart sounds: Normal heart sounds. No murmur heard. Pulmonary:     Effort: Pulmonary effort is normal.     Breath sounds: Normal breath sounds. No wheezing, rhonchi or rales.  Musculoskeletal:     Right lower leg: Edema present.     Left lower leg: Edema present.  Feet:     Right foot:     Skin integrity: Erythema and warmth present. No ulcer or blister.     Toenail Condition: Right toenails are abnormally thick and long. Fungal disease present.    Left foot:     Skin integrity: Blister (2nd toe, medailly) present. No ulcer.     Toenail Condition: Left toenails are abnormally thick and long. Fungal disease present.    Comments: Severe vascular insufficiency. Discoloration bilaterally. Right foot with increased erythema and swelling. Skin:    General: Skin is warm and dry.  Neurological:     General: No focal deficit present.     Mental Status: He is alert and oriented to person, place, and time.  Psychiatric:        Mood and Affect: Mood normal.        Behavior: Behavior normal.       Assessment & Plan:  Encounter to establish care  Debility  Right foot injury, initial encounter -     Cephalexin ; Take 1 capsule (500 mg total) by mouth 3 (three) times daily for 10 days.  Dispense: 30 capsule; Refill: 0 -     DG Foot Complete Right  Hyperlipidemia with target LDL less than 70 -     Lipid panel  Essential hypertension  Controlled type 2 diabetes mellitus with hyperglycemia, without long-term current use of  insulin  (HCC) -     Hemoglobin A1c  Benign prostatic hyperplasia with urinary frequency -     Tamsulosin HCl; Take 1 capsule (0.4 mg total) by mouth daily.  Dispense: 30 capsule; Refill: 3  Cellulitis  of right foot -     Cephalexin ; Take 1 capsule (500 mg total) by mouth 3 (three) times daily for 10 days.  Dispense: 30 capsule; Refill: 0  Sleep-wake disorder -     traZODone HCl; Take 0.5-1 tablets (25-50 mg total) by mouth at bedtime as needed for sleep.  Dispense: 30 tablet; Refill: 3  Immunization due -     Flu vaccine HIGH DOSE PF(Fluzone Trivalent) -     Pneumococcal conjugate vaccine 20-valent    Return in about 3 months (around 06/23/2024).   Charmaine Caryle Helgeson, PA-C

## 2024-03-24 ENCOUNTER — Ambulatory Visit: Payer: Self-pay | Admitting: Physician Assistant

## 2024-03-24 DIAGNOSIS — G4720 Circadian rhythm sleep disorder, unspecified type: Secondary | ICD-10-CM | POA: Insufficient documentation

## 2024-03-24 LAB — HEMOGLOBIN A1C
Est. average glucose Bld gHb Est-mCnc: 177 mg/dL
Hgb A1c MFr Bld: 7.8 % — ABNORMAL HIGH (ref 4.8–5.6)

## 2024-03-24 LAB — LIPID PANEL
Chol/HDL Ratio: 2.2 ratio (ref 0.0–5.0)
Cholesterol, Total: 98 mg/dL — ABNORMAL LOW (ref 100–199)
HDL: 44 mg/dL (ref >39)
LDL Chol Calc (NIH): 39 mg/dL (ref 0–99)
Triglycerides: 67 mg/dL (ref 0–149)
VLDL Cholesterol Cal: 15 mg/dL (ref 5–40)

## 2024-03-24 NOTE — Assessment & Plan Note (Signed)
 131/76 Controlled. Continue current medications. No change in management. Discussed DASH diet and dietary sodium restrictions.

## 2024-03-24 NOTE — Assessment & Plan Note (Signed)
 Insomnia and circadian rhythm disturbance with agitation and restlessness at night. Reverse cycling present. - Prescribe trazodone 25-50 mg at bedtime, starting with 25 mg, may increase to 50 mg as necessary.

## 2024-03-24 NOTE — Assessment & Plan Note (Signed)
 Patient presents today with increased right foot swelling and erythema following a fall from bed less than a week ago. X-rays negative for fracture or acute bony abnormality. Treating as cellulitis do to reports of increased pain and physical exam findings. Keflex  tid for 10 days. Monitor for fevers, increased pain, or persistent symptoms despite antibiotic therapy.

## 2024-03-24 NOTE — Assessment & Plan Note (Signed)
 Urinary incontinence with frequent urination and low volume. Benign prostatic hyperplasia present. Gemtesa  ineffective. - Discontinue Gemtesa . - Prescribe Flomax once daily. - Consider referral to urology for indwelling catheter if needed.

## 2024-03-25 ENCOUNTER — Emergency Department (HOSPITAL_COMMUNITY)

## 2024-03-25 ENCOUNTER — Encounter (HOSPITAL_COMMUNITY): Payer: Self-pay | Admitting: Family Medicine

## 2024-03-25 ENCOUNTER — Inpatient Hospital Stay (HOSPITAL_COMMUNITY)
Admission: EM | Admit: 2024-03-25 | Discharge: 2024-03-30 | DRG: 603 | Disposition: A | Discharge status: Skilled Nursing Facility | Attending: Internal Medicine | Admitting: Internal Medicine

## 2024-03-25 ENCOUNTER — Other Ambulatory Visit: Payer: Self-pay

## 2024-03-25 DIAGNOSIS — Z79899 Other long term (current) drug therapy: Secondary | ICD-10-CM

## 2024-03-25 DIAGNOSIS — Z79890 Hormone replacement therapy (postmenopausal): Secondary | ICD-10-CM | POA: Diagnosis not present

## 2024-03-25 DIAGNOSIS — Z8249 Family history of ischemic heart disease and other diseases of the circulatory system: Secondary | ICD-10-CM

## 2024-03-25 DIAGNOSIS — Z7901 Long term (current) use of anticoagulants: Secondary | ICD-10-CM | POA: Diagnosis not present

## 2024-03-25 DIAGNOSIS — Z1152 Encounter for screening for COVID-19: Secondary | ICD-10-CM

## 2024-03-25 DIAGNOSIS — N401 Enlarged prostate with lower urinary tract symptoms: Secondary | ICD-10-CM

## 2024-03-25 DIAGNOSIS — Z85828 Personal history of other malignant neoplasm of skin: Secondary | ICD-10-CM | POA: Diagnosis not present

## 2024-03-25 DIAGNOSIS — E785 Hyperlipidemia, unspecified: Secondary | ICD-10-CM | POA: Diagnosis present

## 2024-03-25 DIAGNOSIS — Z951 Presence of aortocoronary bypass graft: Secondary | ICD-10-CM

## 2024-03-25 DIAGNOSIS — K219 Gastro-esophageal reflux disease without esophagitis: Secondary | ICD-10-CM | POA: Diagnosis present

## 2024-03-25 DIAGNOSIS — I5032 Chronic diastolic (congestive) heart failure: Secondary | ICD-10-CM | POA: Diagnosis present

## 2024-03-25 DIAGNOSIS — Z7984 Long term (current) use of oral hypoglycemic drugs: Secondary | ICD-10-CM | POA: Diagnosis not present

## 2024-03-25 DIAGNOSIS — R531 Weakness: Principal | ICD-10-CM

## 2024-03-25 DIAGNOSIS — I2489 Other forms of acute ischemic heart disease: Secondary | ICD-10-CM | POA: Diagnosis present

## 2024-03-25 DIAGNOSIS — L03115 Cellulitis of right lower limb: Principal | ICD-10-CM | POA: Diagnosis present

## 2024-03-25 DIAGNOSIS — R296 Repeated falls: Secondary | ICD-10-CM | POA: Diagnosis present

## 2024-03-25 DIAGNOSIS — I110 Hypertensive heart disease with heart failure: Secondary | ICD-10-CM | POA: Diagnosis present

## 2024-03-25 DIAGNOSIS — L03116 Cellulitis of left lower limb: Principal | ICD-10-CM | POA: Diagnosis present

## 2024-03-25 DIAGNOSIS — F17220 Nicotine dependence, chewing tobacco, uncomplicated: Secondary | ICD-10-CM | POA: Diagnosis present

## 2024-03-25 DIAGNOSIS — R32 Unspecified urinary incontinence: Secondary | ICD-10-CM | POA: Diagnosis present

## 2024-03-25 DIAGNOSIS — N400 Enlarged prostate without lower urinary tract symptoms: Secondary | ICD-10-CM | POA: Diagnosis present

## 2024-03-25 DIAGNOSIS — I447 Left bundle-branch block, unspecified: Secondary | ICD-10-CM | POA: Diagnosis present

## 2024-03-25 DIAGNOSIS — R7989 Other specified abnormal findings of blood chemistry: Secondary | ICD-10-CM | POA: Diagnosis not present

## 2024-03-25 DIAGNOSIS — E039 Hypothyroidism, unspecified: Secondary | ICD-10-CM | POA: Diagnosis present

## 2024-03-25 DIAGNOSIS — N390 Urinary tract infection, site not specified: Secondary | ICD-10-CM | POA: Diagnosis present

## 2024-03-25 DIAGNOSIS — Z955 Presence of coronary angioplasty implant and graft: Secondary | ICD-10-CM | POA: Diagnosis not present

## 2024-03-25 DIAGNOSIS — I252 Old myocardial infarction: Secondary | ICD-10-CM

## 2024-03-25 DIAGNOSIS — Z953 Presence of xenogenic heart valve: Secondary | ICD-10-CM | POA: Diagnosis not present

## 2024-03-25 DIAGNOSIS — E1142 Type 2 diabetes mellitus with diabetic polyneuropathy: Secondary | ICD-10-CM | POA: Diagnosis present

## 2024-03-25 DIAGNOSIS — I2510 Atherosclerotic heart disease of native coronary artery without angina pectoris: Secondary | ICD-10-CM | POA: Diagnosis present

## 2024-03-25 LAB — CBC
HCT: 32 % — ABNORMAL LOW (ref 39.0–52.0)
Hemoglobin: 10.7 g/dL — ABNORMAL LOW (ref 13.0–17.0)
MCH: 34.7 pg — ABNORMAL HIGH (ref 26.0–34.0)
MCHC: 33.4 g/dL (ref 30.0–36.0)
MCV: 103.9 fL — ABNORMAL HIGH (ref 80.0–100.0)
Platelets: 131 K/uL — ABNORMAL LOW (ref 150–400)
RBC: 3.08 MIL/uL — ABNORMAL LOW (ref 4.22–5.81)
RDW: 16.4 % — ABNORMAL HIGH (ref 11.5–15.5)
WBC: 6.1 K/uL (ref 4.0–10.5)
nRBC: 0 % (ref 0.0–0.2)

## 2024-03-25 LAB — URINALYSIS, W/ REFLEX TO CULTURE (INFECTION SUSPECTED)
Bacteria, UA: NONE SEEN
Bilirubin Urine: NEGATIVE
Glucose, UA: NEGATIVE mg/dL
Ketones, ur: NEGATIVE mg/dL
Nitrite: NEGATIVE
Protein, ur: NEGATIVE mg/dL
Specific Gravity, Urine: 1.011 (ref 1.005–1.030)
pH: 6 (ref 5.0–8.0)

## 2024-03-25 LAB — RESP PANEL BY RT-PCR (RSV, FLU A&B, COVID)  RVPGX2
Influenza A by PCR: NEGATIVE
Influenza B by PCR: NEGATIVE
Resp Syncytial Virus by PCR: NEGATIVE
SARS Coronavirus 2 by RT PCR: NEGATIVE

## 2024-03-25 LAB — COMPREHENSIVE METABOLIC PANEL WITH GFR
ALT: 33 U/L (ref 0–44)
AST: 40 U/L (ref 15–41)
Albumin: 4.2 g/dL (ref 3.5–5.0)
Alkaline Phosphatase: 164 U/L — ABNORMAL HIGH (ref 38–126)
Anion gap: 15 (ref 5–15)
BUN: 106 mg/dL — ABNORMAL HIGH (ref 8–23)
CO2: 30 mmol/L (ref 22–32)
Calcium: 9.4 mg/dL (ref 8.9–10.3)
Chloride: 91 mmol/L — ABNORMAL LOW (ref 98–111)
Creatinine, Ser: 2.06 mg/dL — ABNORMAL HIGH (ref 0.61–1.24)
GFR, Estimated: 29 mL/min — ABNORMAL LOW (ref >60)
Glucose, Bld: 181 mg/dL — ABNORMAL HIGH (ref 70–99)
Potassium: 3.9 mmol/L (ref 3.5–5.1)
Sodium: 136 mmol/L (ref 135–145)
Total Bilirubin: 1.1 mg/dL (ref 0.0–1.2)
Total Protein: 7 g/dL (ref 6.5–8.1)

## 2024-03-25 LAB — LACTIC ACID, PLASMA: Lactic Acid, Venous: 1.8 mmol/L (ref 0.5–1.9)

## 2024-03-25 LAB — TROPONIN T, HIGH SENSITIVITY
Troponin T High Sensitivity: 151 ng/L (ref 0–19)
Troponin T High Sensitivity: 138 ng/L (ref 0–19)
Troponin T High Sensitivity: 129 ng/L (ref 0–19)

## 2024-03-25 LAB — CBG MONITORING, ED: Glucose-Capillary: 198 mg/dL — ABNORMAL HIGH (ref 70–99)

## 2024-03-25 MED ORDER — PANTOPRAZOLE SODIUM 40 MG PO TBEC
40.0000 mg | DELAYED_RELEASE_TABLET | Freq: Every day | ORAL | Status: DC
  Administered 2024-03-26 – 2024-03-30 (×5): 40 mg via ORAL
  Filled 2024-03-25 (×5): qty 1

## 2024-03-25 MED ORDER — POLYVINYL ALCOHOL 1.4 % OP SOLN: 1.0000 [drp] | Freq: Every day | OPHTHALMIC | Status: DC | PRN

## 2024-03-25 MED ORDER — APIXABAN 2.5 MG PO TABS: 2.5000 mg | ORAL_TABLET | Freq: Two times a day (BID) | ORAL | Status: DC

## 2024-03-25 MED ORDER — APIXABAN 2.5 MG PO TABS
2.5000 mg | ORAL_TABLET | Freq: Two times a day (BID) | ORAL | Status: DC
  Administered 2024-03-26 – 2024-03-30 (×9): 2.5 mg via ORAL
  Filled 2024-03-25 (×9): qty 1

## 2024-03-25 MED ORDER — ONDANSETRON HCL 4 MG/2ML IJ SOLN: 4.0000 mg | Freq: Four times a day (QID) | INTRAMUSCULAR | Status: DC | PRN

## 2024-03-25 MED ORDER — VANCOMYCIN HCL IN DEXTROSE 1-5 GM/200ML-% IV SOLN
1000.0000 mg | Freq: Once | INTRAVENOUS | Status: AC
  Administered 2024-03-25: 1000 mg via INTRAVENOUS
  Filled 2024-03-25: qty 200

## 2024-03-25 MED ORDER — ACETAMINOPHEN 325 MG PO TABS: 650.0000 mg | ORAL_TABLET | Freq: Four times a day (QID) | ORAL | Status: DC | PRN

## 2024-03-25 MED ORDER — ADULT MULTIVITAMIN W/MINERALS CH
1.0000 | ORAL_TABLET | Freq: Every day | ORAL | Status: DC
  Administered 2024-03-26 – 2024-03-30 (×5): 1 via ORAL
  Filled 2024-03-25 (×5): qty 1

## 2024-03-25 MED ORDER — SODIUM CHLORIDE 0.9 % IV SOLN
1.0000 g | Freq: Once | INTRAVENOUS | Status: AC
  Administered 2024-03-25: 1 g via INTRAVENOUS
  Filled 2024-03-25: qty 10

## 2024-03-25 MED ORDER — TRAZODONE HCL 50 MG PO TABS
25.0000 mg | ORAL_TABLET | Freq: Every evening | ORAL | Status: DC | PRN
  Administered 2024-03-26 – 2024-03-28 (×3): 50 mg via ORAL
  Filled 2024-03-25 (×3): qty 1

## 2024-03-25 MED ORDER — TAMSULOSIN HCL 0.4 MG PO CAPS
0.4000 mg | ORAL_CAPSULE | Freq: Every day | ORAL | Status: DC
  Administered 2024-03-25 – 2024-03-29 (×5): 0.4 mg via ORAL
  Filled 2024-03-25 (×5): qty 1

## 2024-03-25 MED ORDER — PRESERVISION AREDS PO TABS: 1.0000 | ORAL_TABLET | Freq: Every day | ORAL | Status: DC

## 2024-03-25 MED ORDER — TORSEMIDE 20 MG PO TABS: 20.0000 mg | ORAL_TABLET | ORAL | Status: DC

## 2024-03-25 MED ORDER — VANCOMYCIN HCL 1500 MG/300ML IV SOLN
1500.0000 mg | INTRAVENOUS | Status: DC
  Administered 2024-03-27 – 2024-03-29 (×2): 1500 mg via INTRAVENOUS
  Filled 2024-03-25 (×2): qty 300

## 2024-03-25 MED ORDER — POLYETHYL GLYCOL-PROPYL GLYCOL 0.4-0.3 % OP SOLN: 1.0000 [drp] | Freq: Every day | OPHTHALMIC | Status: DC | PRN

## 2024-03-25 MED ORDER — POTASSIUM CHLORIDE CRYS ER 20 MEQ PO TBCR
20.0000 meq | EXTENDED_RELEASE_TABLET | Freq: Every day | ORAL | Status: DC
  Administered 2024-03-26 – 2024-03-30 (×5): 20 meq via ORAL
  Filled 2024-03-25 (×5): qty 1

## 2024-03-25 MED ORDER — VITAMIN C 500 MG PO TABS
500.0000 mg | ORAL_TABLET | Freq: Every day | ORAL | Status: DC
  Administered 2024-03-26 – 2024-03-30 (×5): 500 mg via ORAL
  Filled 2024-03-25 (×5): qty 1

## 2024-03-25 MED ORDER — SODIUM CHLORIDE 0.9 % IV SOLN
2.0000 g | INTRAVENOUS | Status: DC
  Administered 2024-03-26 – 2024-03-30 (×5): 2 g via INTRAVENOUS
  Filled 2024-03-25 (×6): qty 12.5

## 2024-03-25 MED ORDER — PRAVASTATIN SODIUM 40 MG PO TABS
40.0000 mg | ORAL_TABLET | Freq: Every day | ORAL | Status: DC
  Administered 2024-03-25 – 2024-03-29 (×5): 40 mg via ORAL
  Filled 2024-03-25 (×5): qty 1

## 2024-03-25 MED ORDER — ENOXAPARIN SODIUM 40 MG/0.4ML IJ SOSY: 40.0000 mg | PREFILLED_SYRINGE | INTRAMUSCULAR | Status: DC

## 2024-03-25 MED ORDER — METOLAZONE 5 MG PO TABS
2.5000 mg | ORAL_TABLET | Freq: Every day | ORAL | Status: DC
  Administered 2024-03-26 – 2024-03-30 (×5): 2.5 mg via ORAL
  Filled 2024-03-25 (×5): qty 1

## 2024-03-25 MED ORDER — METOPROLOL SUCCINATE ER 25 MG PO TB24
12.5000 mg | ORAL_TABLET | Freq: Every day | ORAL | Status: DC
  Administered 2024-03-26 – 2024-03-30 (×5): 12.5 mg via ORAL
  Filled 2024-03-25 (×5): qty 1

## 2024-03-25 MED ORDER — SODIUM CHLORIDE 0.9 % IV SOLN: INTRAVENOUS | Status: DC

## 2024-03-25 MED ORDER — ACETAMINOPHEN 650 MG RE SUPP: 650.0000 mg | Freq: Four times a day (QID) | RECTAL | Status: DC | PRN

## 2024-03-25 MED ORDER — ENOXAPARIN SODIUM 30 MG/0.3ML IJ SOSY
30.0000 mg | PREFILLED_SYRINGE | INTRAMUSCULAR | Status: DC
  Administered 2024-03-25: 30 mg via SUBCUTANEOUS
  Filled 2024-03-25: qty 0.3

## 2024-03-25 MED ORDER — TORSEMIDE 20 MG PO TABS
40.0000 mg | ORAL_TABLET | ORAL | Status: DC
Start: 2024-03-26 — End: 2024-03-26

## 2024-03-25 MED ORDER — LORATADINE 10 MG PO TABS
10.0000 mg | ORAL_TABLET | Freq: Every day | ORAL | Status: DC
  Administered 2024-03-25 – 2024-03-29 (×5): 10 mg via ORAL
  Filled 2024-03-25 (×5): qty 1

## 2024-03-25 MED ORDER — ONDANSETRON HCL 4 MG PO TABS: 4.0000 mg | ORAL_TABLET | Freq: Four times a day (QID) | ORAL | Status: DC | PRN

## 2024-03-25 MED ORDER — HYDROXYZINE HCL 10 MG PO TABS
10.0000 mg | ORAL_TABLET | Freq: Every day | ORAL | Status: DC
  Administered 2024-03-25 – 2024-03-29 (×5): 10 mg via ORAL
  Filled 2024-03-25 (×5): qty 1

## 2024-03-25 MED ORDER — TRAZODONE HCL 50 MG PO TABS: 25.0000 mg | ORAL_TABLET | Freq: Every evening | ORAL | Status: DC | PRN

## 2024-03-25 MED ORDER — VANCOMYCIN HCL IN DEXTROSE 1-5 GM/200ML-% IV SOLN: 1000.0000 mg | Freq: Once | INTRAVENOUS | Status: DC

## 2024-03-25 MED ORDER — SENNOSIDES-DOCUSATE SODIUM 8.6-50 MG PO TABS: 1.0000 | ORAL_TABLET | Freq: Every evening | ORAL | Status: DC | PRN

## 2024-03-25 MED ORDER — PROSIGHT PO TABS
1.0000 | ORAL_TABLET | Freq: Every day | ORAL | Status: DC
  Administered 2024-03-26 – 2024-03-30 (×5): 1 via ORAL
  Filled 2024-03-25 (×5): qty 1

## 2024-03-25 NOTE — Progress Notes (Signed)
 Erminio Cone NP notified of troponin of 129

## 2024-03-25 NOTE — Consult Note (Signed)
 Pharmacy Antibiotic Note  DOREN KASPAR is a 89 y.o. male admitted on 03/25/2024 with weakness.  Pharmacy has been consulted for Vancomycin  and Cefepime dosing.  Plan: Vancomycin  2000 mg total as loading dose, followed by: Vancomycin  1500 IV Q 48 hrs. Goal AUC 400-550. Expected AUC: 480.2 SCr used: 2.06(near baseline), Vd used: 0.72  Cefepime 2g Q 24 hours   Height: 6' (182.9 cm) Weight: 90.6 kg (199 lb 11.8 oz) IBW/kg (Calculated) : 77.6  Temp (24hrs), Avg:97.8 F (36.6 C), Min:97.8 F (36.6 C), Max:97.8 F (36.6 C)  Recent Labs  Lab 03/25/24 1729  WBC 6.1  CREATININE 2.06*  LATICACIDVEN 1.8    Estimated Creatinine Clearance: 23.5 mL/min (A) (by C-G formula based on SCr of 2.06 mg/dL (H)).    Allergies  Allergen Reactions   Januvia [Sitagliptin] Other (See Comments)    Unknown    Penicillins Other (See Comments)    Unknown reaction    Antimicrobials this admission: Vancomycin  10/23 >>  Cefepime 10/23 >>  Ceftriaxone  10/23 x1  Dose adjustments this admission: N/A  Microbiology results: 10/23 BCx: collected 10/23 UCx: pending    Thank you for allowing pharmacy to be a part of this patient's care.  Jru Pense A Keona Sheffler 03/25/2024 8:57 PM

## 2024-03-25 NOTE — ED Triage Notes (Signed)
 Pt arrived [facility] from home with c/o weakness that started a day or two ago. Pt fell Sat and is on eliquis . Bruising noted to right shoulder.

## 2024-03-25 NOTE — ED Notes (Signed)
 Informed family at bedside and pt of need for urine sample, family stated pt can sometimes tell when he needs to void.

## 2024-03-25 NOTE — H&P (Incomplete)
 [place]   PATIENT NAME: William Walker    MR#:  [id number]  DATE OF BIRTH:  November 06, 1926  DATE OF ADMISSION:  03/25/2024  PRIMARY CARE PHYSICIAN: Grooms, [place], [place]   Patient is coming from: Home  REQUESTING/REFERRING PHYSICIAN: Towana Sharper, MD  CHIEF COMPLAINT:   Chief Complaint  Patient presents with   Weakness    HISTORY OF PRESENT ILLNESS:  William Walker is a 89 y.o. Caucasian male with medical history significant for essential hypertension, coronary artery disease, urolithiasis, dyslipidemia, type diabetes mellitus with diabetic neuropathy who presented to the emergency room with acute onset of generalized weakness and worsening right foot and leg swelling with erythema with tenderness since Tuesday.  She was given outpatient antibiotic therapy with p.o. Keflex  which she has been taking without significant improvement.  She admits to urinary frequency and urgency without dysuria or hematuria or flank pain.  No cough or wheezing or dyspnea.  She has no chest pain or palpitations.  The patient has been having recurrent falls.  ED Course: When she came to the ER, vital signs were within normal.  Labs revealed low chloride of 91 and blood glucose 181 with BUN of 106 and creatinine 2.06.  Alk phos was 164 high sensitive troponin I was 151 and later 138 and 129.  Potassium was 1.8.  CBC showed hemoglobin 10.7 and hematocrit 32 with platelets 131.  Respiratory panel came back negative.  UA showed 11-20 WBCs and 21-50 RBCs. EKG as reviewed by me : EKG showed sinus rhythm with a rate of 61 with left bundle branch block. Imaging: Portable chest x-ray showed the following: 1. Hazy bilateral perihilar and bibasilar opacification likely due to interstitial edema with bilateral effusions/bibasilar atelectasis. Infection in the lung bases is possible. 2. Stable cardiomegaly.  The patient was given IV vancomycin  and Rocephin .  He will be admitted to a telemetry bed for further  evaluation and management. PAST MEDICAL HISTORY:   Past Medical History:  Diagnosis Date   Aortic stenosis    a. s/p AVR in 11/2019   Arthritis    Coronary atherosclerosis of native coronary artery    a. s/p prior stenting of LAD b. cath in 11/2019 showing LM and 3-vessel CAD--> s/p CABG on 11/22/2019 with LIMA-LAD, SVG-dRCA and seq SVG-RI-OM1 and complicated by post-cardiotomy shock   CVD (cerebrovascular disease)    Essential hypertension    GERD (gastroesophageal reflux disease)    History of kidney stones    Hyperlipidemia    Hypothyroidism 03/26/2024   MI (myocardial infarction) [facility])    October 1999   Skin cancer    Type 2 diabetes mellitus with diabetic neuropathy (HCC)     PAST SURGICAL HISTORY:   Past Surgical History:  Procedure Laterality Date   AORTIC VALVE REPLACEMENT N/A 11/22/2019   Procedure: AORTIC VALVE REPLACEMENT (AVR) USING INSPIRIS ;  Surgeon: Army Dallas NOVAK, MD;  Location: [facility] OR;  Service: Open Heart Surgery;  Laterality: N/A;   Arch cerebral ateriogram  12/11/2005   Krystal MANO Early MD   CATARACT EXTRACTION, BILATERAL     CORONARY ARTERY BYPASS GRAFT N/A 11/22/2019   Procedure: CORONARY ARTERY BYPASS GRAFTING (CABG), ON PUMP, TIMES FOUR , USING LEFT INTERNAL MAMMARY ARTERY AND ENDOSCOPICALLY HARVESTED RIGHT GREATER SAPHENOUS VEIN;  Surgeon: Army Dallas NOVAK, MD;  Location: [facility] OR;  Service: Open Heart Surgery;  Laterality: N/A;  SEQUENTIAL RAMUS INTERMEDIATE TO OM1 SAPHENOUS VEIN TO DISTAL PDA LIMA TO LAD   INGUINAL  HERNIA REPAIR Left 12/09/2012   Procedure: HERNIA REPAIR INGUINAL with mesh;  Surgeon: Oneil DELENA Budge, MD;  Location: [facility] ORS;  Service: General;  Laterality: Left;   INGUINAL HERNIA REPAIR Right 02/15/2021   Procedure: OPEN RIGHT INGUINAL HERNIA REPAIR WITH MESH;  Surgeon: Eletha Boas, MD;  Location: [facility] ORS;  Service: General;  Laterality: Right;   INSERTION OF MESH Left 12/09/2012   Procedure: INSERTION OF MESH;  Surgeon: Oneil DELENA Budge, MD;   Location: [facility] ORS;  Service: General;  Laterality: Left;   RIGHT/LEFT HEART CATH AND CORONARY ANGIOGRAPHY N/A 10/07/2017   Procedure: RIGHT/LEFT HEART CATH AND CORONARY ANGIOGRAPHY;  Surgeon: Wonda Sharper, MD;  Location: [facility] INVASIVE CV LAB;  Service: Cardiovascular;  Laterality: N/A;   RIGHT/LEFT HEART CATH AND CORONARY ANGIOGRAPHY N/A 11/16/2019   Procedure: RIGHT/LEFT HEART CATH AND CORONARY ANGIOGRAPHY;  Surgeon: Court Dorn PARAS, MD;  Location: [facility] INVASIVE CV LAB;  Service: Cardiovascular;  Laterality: N/A;   TEE WITHOUT CARDIOVERSION N/A 11/22/2019   Procedure: TRANSESOPHAGEAL ECHOCARDIOGRAM (TEE);  Surgeon: Army Dallas NOVAK, MD;  Location: [facility] OR;  Service: Open Heart Surgery;  Laterality: N/A;    SOCIAL HISTORY:   Social History   Tobacco Use   Smoking status: Former    Types: Pipe, Cigars    Quit date: 12/03/1948    Years since quitting: 75.3   Smokeless tobacco: Current    Types: Chew   Tobacco comments:    has been chewing tobacco for 60-70 years  Substance Use Topics   Alcohol use: No    FAMILY HISTORY:   Family History  Problem Relation Age of Onset   Coronary artery disease Father     DRUG ALLERGIES:   Allergies  Allergen Reactions   Januvia [Sitagliptin] Other (See Comments)    Unknown    Penicillins Other (See Comments)    Unknown reaction    REVIEW OF SYSTEMS:   ROS As per history of present illness. All pertinent systems were reviewed above. Constitutional, HEENT, cardiovascular, respiratory, GI, GU, musculoskeletal, neuro, psychiatric, endocrine, integumentary and hematologic systems were reviewed and are otherwise negative/unremarkable except for positive findings mentioned above in the HPI.   MEDICATIONS AT HOME:   Prior to Admission medications   Medication Sig Start Date End Date Taking? Authorizing Provider  acetaminophen  (TYLENOL ) 650 MG CR tablet Take 650 mg by mouth every 8 (eight) hours as needed for pain.   Yes [provider]   apixaban  (ELIQUIS ) 2.5 MG TABS tablet TAKE 1 TABLET BY MOUTH TWICE  DAILY 10/08/23  Yes Debera Jayson MATSU, MD  ascorbic acid (VITAMIN C) 500 MG tablet Take 500 mg by mouth daily.   Yes [provider]  cephALEXin  (KEFLEX ) 500 MG capsule Take 1 capsule (500 mg total) by mouth 3 (three) times daily for 10 days. 03/23/24 04/02/24 Yes Grooms, Courtney, PA-C  clindamycin (CLEOCIN) 300 MG capsule Take 900 mg by mouth See admin instructions. Prior to dental work   Yes [provider]  glipiZIDE  (GLUCOTROL ) 5 MG tablet Take 5 mg by mouth 2 (two) times daily before a meal.   Yes [provider]  hydrOXYzine (ATARAX) 10 MG tablet Take 10 mg by mouth at bedtime.   Yes [provider]  ibuprofen (ADVIL) 200 MG tablet Take 200 mg by mouth every 6 (six) hours as needed for mild pain (pain score 1-3).   Yes [provider]  levothyroxine  (SYNTHROID ) 125 MCG tablet Take 125 mcg by mouth daily.   Yes [provider]  loratadine (CLARITIN) 10 MG tablet Take 10 mg by mouth at bedtime.   Yes [provider]  metolazone (ZAROXOLYN) 2.5 MG tablet Take 2.5 mg by mouth daily.   Yes [provider]  metoprolol  succinate (TOPROL -XL) 25 MG 24 hr tablet TAKE ONE-HALF TABLET BY MOUTH  DAILY 03/16/24  Yes Debera Jayson MATSU, MD  Multiple Vitamin (MULTIVITAMIN WITH MINERALS) TABS tablet Take 1 tablet by mouth daily. 12/08/19  Yes Gold, Wayne E, PA-C  Multiple Vitamins-Minerals (PRESERVISION AREDS) TABS Take 1 tablet by mouth in the morning and at bedtime.   Yes [provider]  pantoprazole  (PROTONIX ) 40 MG tablet Take 1 tablet (40 mg total) by mouth daily. 12/15/19  Yes Angiulli, Toribio PARAS, PA-C  Polyethyl Glycol-Propyl Glycol (SYSTANE) 0.4-0.3 % SOLN Place 1 drop into both eyes daily as needed (dry eyes).   Yes [provider]  potassium chloride  SA (KLOR-CON  M) 20 MEQ tablet Take 20 mEq by mouth daily.   Yes [provider]   pravastatin  (PRAVACHOL ) 40 MG tablet TAKE 1 TABLET BY MOUTH IN THE  EVENING 07/08/23  Yes Debera Jayson MATSU, MD  tamsulosin (FLOMAX) 0.4 MG CAPS capsule Take 1 capsule (0.4 mg total) by mouth daily. Patient taking differently: Take 0.4 mg by mouth daily after supper. 03/23/24  Yes Grooms, Courtney, PA-C  torsemide  (DEMADEX ) 20 MG tablet TAKE 2 TABLETS BY MOUTH DAILY  ALTERNATING WITH 1 TABLET BY  MOUTH DAILY Patient taking differently: Take 20-40 mg by mouth daily. TAKE 2 TABLETS BY MOUTH DAILY  ALTERNATING WITH 1 TABLET BY  MOUTH DAILY 02/17/24  Yes Debera Jayson MATSU, MD  traZODone (DESYREL) 50 MG tablet Take 0.5-1 tablets (25-50 mg total) by mouth at bedtime as needed for sleep. 03/23/24  Yes Grooms, Courtney, PA-C  Vibegron  (GEMTESA ) 75 MG TABS Take 1 tablet by mouth at bedtime.   Yes [provider]      VITAL SIGNS:  Blood pressure (!) 117/49, pulse (!) 58, temperature 97.9 F (36.6 C), temperature source Oral, resp. rate 16, height 6' (1.829 m), weight 90.6 kg, SpO2 93%.  PHYSICAL EXAMINATION:  Physical Exam  GENERAL:  89 y.o.-year-old Caucasian male patient lying in the bed with no acute distress.  EYES: Pupils equal, round, reactive to light and accommodation. No scleral icterus. Extraocular muscles intact.  HEENT: Head atraumatic, normocephalic. Oropharynx and nasopharynx clear.  NECK:  Supple, no jugular venous distention. No thyroid  enlargement, no tenderness.  LUNGS: Normal breath sounds bilaterally, no wheezing, rales,rhonchi or crepitation. No use of accessory muscles of respiration.  CARDIOVASCULAR: Regular rate and rhythm, S1, S2 normal. No murmurs, rubs, or gallops.  ABDOMEN: Soft, nondistended, nontender. Bowel sounds present. No organomegaly or mass.  EXTREMITIES: 1-2+ bilateral lower extremity pitting edema, with no cyanosis, or clubbing.  NEUROLOGIC: Cranial nerves II through XII are intact. Muscle strength 5/5 in all extremities. Sensation intact. Gait not  checked.  PSYCHIATRIC: The patient is alert and oriented x 3.  Normal affect and good eye contact. SKIN: Left leg skin abrasions and scabbing with contusion bilaterally leg and right foot erythema, induration, warmth and tenderness.     LABORATORY PANEL:   CBC Recent Labs  Lab 03/25/24 1729  WBC 6.1  HGB 10.7*  HCT 32.0*  PLT 131*   ------------------------------------------------------------------------------------------------------------------  Chemistries  Recent Labs  Lab 03/25/24 1729  NA 136  K 3.9  CL 91*  CO2 30  GLUCOSE 181*  BUN 106*  CREATININE 2.06*  CALCIUM  9.4  AST 40  ALT 33  ALKPHOS 164*  BILITOT 1.1   ------------------------------------------------------------------------------------------------------------------  Cardiac Enzymes No results for input(s): TROPONINI in the last 168 hours. ------------------------------------------------------------------------------------------------------------------  RADIOLOGY:  CT Head Wo Contrast Result Date: 03/25/2024 CLINICAL DATA:  Altered mental status. Weakness. Fall 5 days ago. Patient on Eliquis . EXAM: CT HEAD WITHOUT CONTRAST TECHNIQUE: Contiguous axial images were obtained from the base of the skull through the vertex without intravenous contrast. RADIATION DOSE REDUCTION: This exam was performed according to the departmental dose-optimization program which includes automated exposure control, adjustment of the mA and/or kV according to patient size and/or use of iterative reconstruction technique. COMPARISON:  01/10/2024 FINDINGS: Brain: Ventricles, cisterns and other CSF spaces are within normal. There is mild age related atrophic change present. There is mild chronic ischemic microvascular disease. Small old lacunar infarct over the head of left caudate nucleus. Vascular: No hyperdense vessel or unexpected calcification. Skull: Normal. Negative for fracture or focal lesion. Sinuses/Orbits: No acute  finding. Other: None. IMPRESSION: 1. No acute findings. 2. Mild age related atrophic change and chronic ischemic microvascular disease. Small old lacunar infarct over the head of the left caudate nucleus. Electronically Signed   By: Toribio Agreste M.D.   On: 03/25/2024 18:16   DG Chest 1 View Result Date: 03/25/2024 CLINICAL DATA:  Weakness. EXAM: CHEST  1 VIEW COMPARISON:  02/15/2024 FINDINGS: Patient slightly rotated to the left. Sternotomy wires unchanged. Prosthetic aortic valve unchanged. Lungs are adequately inflated with hazy bilateral perihilar and bibasilar opacification which may be due to interstitial edema with bilateral effusions/bibasilar atelectasis. Infection in the lung bases is possible. Mild stable cardiomegaly. Remainder of the exam is unchanged. IMPRESSION: 1. Hazy bilateral perihilar and bibasilar opacification likely due to interstitial edema with bilateral effusions/bibasilar atelectasis. Infection in the lung bases is possible. 2. Stable cardiomegaly. Electronically Signed   By: Toribio Agreste M.D.   On: 03/25/2024 17:41      IMPRESSION AND PLAN:  Assessment and Plan: * Generalized weakness - The patient will be admitted to the medical telemetry bed. - Physical therapy will be obtained given inability to ambulate. - Management otherwise as below.  Cellulitis of right lower extremity - The patient will be placed on IV vancomycin  and cefepime. - Warm compresses will be utilized. - Pain management will be provided.  Acute lower UTI - Will obtain urine culture and sensitivity. - This could be contributing to his generalized weakness. - This should be covered by IV cefepime.  Elevated troponin I level - This is likely secondary to demand ischemia and has been coming down.  Chronic diastolic CHF (congestive heart failure) (HCC) - The patient has no symptoms of clinical exacerbation. - Given chest x-ray findings we will place him on IV Lasix  though. - Will continue  Toprol -XL.  Dyslipidemia - Will continue statin therapy.  BPH (benign prostatic hyperplasia) - Will continue Flomax.  Status post aortic valve replacement with bioprosthetic valve - Will continue Eliquis .  GERD without esophagitis - Will continue PPI therapy.  Hypothyroidism - Will continue Synthroid .  Type 2 diabetes mellitus with peripheral neuropathy (HCC) The patient will be placed on supplement coverage with NovoLog . - Will continue Glucotrol .   DVT prophylaxis: Eliquis . Advanced Care Planning:  Code Status: full code. Family Communication:  The plan of care was discussed in details with the patient (and family). I answered all questions. The patient agreed to proceed with the above mentioned plan. Further management will depend upon hospital course. Disposition Plan: Back to previous  home environment Consults called: none. All the records are reviewed and case discussed with ED provider.  Status is: Inpatient  At the time of the admission, it appears that the appropriate admission status for this patient is inpatient.  This is judged to be reasonable and necessary in order to provide the required intensity of service to ensure the patient's safety given the presenting symptoms, physical exam findings and initial radiographic and laboratory data in the context of comorbid conditions.  The patient requires inpatient status due to high intensity of service, high risk of further deterioration and high frequency of surveillance required.  I certify that at the time of admission, it is my clinical judgment that the patient will require inpatient hospital care extending more than 2 midnights.                            Dispo: The patient is from: Home              Anticipated d/c is to: Home              Patient currently is not medically stable to d/c.              Difficult to place patient: No  Madison DELENA Peaches M.D on 03/26/2024 at 5:06 AM  [facility]   From 7 PM-7  AM, contact night-coverage [email or web address]  CC: Primary care physician; Grooms, Farina, PA-C

## 2024-03-25 NOTE — ED Provider Notes (Signed)
 [place] EMERGENCY DEPARTMENT AT [facility] Provider Note   CSN: [id number] Arrival date & time: 03/25/24  1600     Patient presents with: Weakness   William Walker is a 89 y.o. male.  He is brought in by ambulance from home.  Daughter is present who is giving most of the history.  She said he has been declining over the last week or so.  He usually can ambulate with a walker.  Clemens out of bed about 5 days ago.  Prior to that had a fall couple of weeks ago.  Saw his new primary care doctor few days ago started trazodone for sleep.  Put on Keflex  for possible cellulitis of right lower extremity.  Has having more difficulty ambulating and now cannot ambulate.  Also new confusion.  Baseline incontinence but is more frequent.  Dribbling fluid out of his mouth when he drinks.  Patient himself denies any complaints.  He is on chronic Eliquis .  {Add pertinent medical, surgical, social history, OB history to YEP:67052} The history is provided by the patient and a relative.  Weakness Severity:  Moderate Onset quality:  Gradual Duration:  5 days Timing:  Constant Progression:  Worsening Chronicity:  New Relieved by:  Nothing Worsened by:  Activity Ineffective treatments:  Rest Associated symptoms: difficulty walking, falls, frequency and stroke symptoms   Associated symptoms: no abdominal pain, no chest pain, no cough, no shortness of breath and no vomiting        Prior to Admission medications   Medication Sig Start Date End Date Taking? Authorizing Provider  acetaminophen  (TYLENOL ) 325 MG tablet Take 650 mg by mouth every 6 (six) hours as needed.    [provider]  apixaban  (ELIQUIS ) 2.5 MG TABS tablet TAKE 1 TABLET BY MOUTH TWICE  DAILY 10/08/23   Debera Jayson MATSU, MD  cephALEXin  (KEFLEX ) 500 MG capsule Take 1 capsule (500 mg total) by mouth 3 (three) times daily for 10 days. 03/23/24 04/02/24  Grooms, Courtney, PA-C  clindamycin (CLEOCIN) 300 MG capsule Take 900  mg by mouth See admin instructions. Prior to dental work    [provider]  glipiZIDE  (GLUCOTROL ) 5 MG tablet Take 5 mg by mouth 2 (two) times daily before a meal.    [provider]  hydrOXYzine (ATARAX) 10 MG tablet Take 10 mg by mouth at bedtime.    [provider]  ibuprofen (ADVIL) 200 MG tablet Take 200 mg by mouth every 6 (six) hours as needed.    [provider]  levothyroxine  (SYNTHROID ) 75 MCG tablet Take 125 mcg by mouth daily before breakfast.    [provider]  loratadine (CLARITIN) 10 MG tablet Take 10 mg by mouth daily.    [provider]  metolazone (ZAROXOLYN) 2.5 MG tablet Take 2.5 mg by mouth daily.    [provider]  metoprolol  succinate (TOPROL -XL) 25 MG 24 hr tablet TAKE ONE-HALF TABLET BY MOUTH  DAILY 03/16/24   McDowell, Samuel G, MD  Multiple Vitamin (MULTIVITAMIN WITH MINERALS) TABS tablet Take 1 tablet by mouth daily. 12/08/19   Gold, Wayne E, PA-C  Multiple Vitamins-Minerals (PRESERVISION AREDS) TABS Take by mouth.    [provider]  pantoprazole  (PROTONIX ) 40 MG tablet Take 1 tablet (40 mg total) by mouth daily. 12/15/19   Angiulli, Toribio PARAS, PA-C  Polyethyl Glycol-Propyl Glycol (SYSTANE) 0.4-0.3 % SOLN Place 1 drop into both eyes daily as needed (dry eyes).    [provider]  potassium  chloride SA (KLOR-CON  M) 20 MEQ tablet Take 20 mEq by mouth daily.    [provider]  pravastatin  (PRAVACHOL ) 40 MG tablet TAKE 1 TABLET BY MOUTH IN THE  EVENING 07/08/23   Debera Jayson MATSU, MD  tamsulosin (FLOMAX) 0.4 MG CAPS capsule Take 1 capsule (0.4 mg total) by mouth daily. 03/23/24   Grooms, Courtney, PA-C  torsemide  (DEMADEX ) 20 MG tablet TAKE 2 TABLETS BY MOUTH DAILY  ALTERNATING WITH 1 TABLET BY  MOUTH DAILY 02/17/24   Debera Jayson MATSU, MD  traZODone (DESYREL) 50 MG tablet Take 0.5-1 tablets (25-50 mg total) by mouth at bedtime as needed for sleep. 03/23/24   Grooms, Tabiona, PA-C     Allergies: Januvia [sitagliptin]    Review of Systems  Unable to perform ROS: Mental status change  Respiratory:  Negative for cough and shortness of breath.   Cardiovascular:  Negative for chest pain.  Gastrointestinal:  Negative for abdominal pain and vomiting.  Genitourinary:  Positive for frequency.  Musculoskeletal:  Positive for falls.  Neurological:  Positive for weakness.    Updated Vital Signs BP 136/64 (BP Location: Left Arm)   Pulse 61   Temp 97.8 F (36.6 C) (Axillary)   Resp 16   Ht 5' 11 (1.803 m)   Wt 92 kg   SpO2 95%   BMI 28.29 kg/m   Physical Exam Vitals and nursing note reviewed.  Constitutional:      General: He is not in acute distress.    Appearance: Normal appearance. He is well-developed.  HENT:     Head: Normocephalic and atraumatic.  Eyes:     Conjunctiva/sclera: Conjunctivae normal.  Cardiovascular:     Rate and Rhythm: Normal rate and regular rhythm.     Heart sounds: No murmur heard. Pulmonary:     Effort: Pulmonary effort is normal. No respiratory distress.     Breath sounds: Normal breath sounds.  Abdominal:     Palpations: Abdomen is soft.     Tenderness: There is no abdominal tenderness. There is no guarding or rebound.  Musculoskeletal:     Cervical back: Neck supple.     Right lower leg: Edema present.     Left lower leg: Edema present.     Comments: He has chronic state changes of both of his lower extremities.  He has got some healing wounds on his left shin.  Right foot and lower leg are more violaceous and swollen.  Skin:    General: Skin is warm and dry.     Capillary Refill: Capillary refill takes less than 2 seconds.  Neurological:     General: No focal deficit present.     Mental Status: He is alert. He is disoriented.     Motor: No weakness.        (all labs ordered are listed, but only abnormal results are displayed) Labs Reviewed  CBG MONITORING, ED - Abnormal; Notable for the following components:       Result Value   Glucose-Capillary 198 (*)    All other components within normal limits  COMPREHENSIVE METABOLIC PANEL WITH GFR  CBC  URINALYSIS, ROUTINE W REFLEX MICROSCOPIC    EKG: EKG Interpretation Date/Time:  Thursday March 25 2024 16:51:38 EDT Ventricular Rate:  61 PR Interval:  35 QRS Duration:  134 QT Interval:  374 QTC Calculation: 377 R Axis:   -42  Text Interpretation: Sinus rhythm vs flutter Short PR interval Left bundle branch block Confirmed by Towana Sharper [phone]) on 03/25/2024  4:59:13 PM  Radiology: No results found.  {Document cardiac monitor, telemetry assessment procedure when appropriate:32947} Procedures   Medications Ordered in the ED - No data to display    {Click here for ABCD2, HEART and other calculators REFRESH Note before signing:1}                              Medical Decision Making Amount and/or Complexity of Data Reviewed Labs: ordered. Radiology: ordered.   This patient complains of ***; this involves an extensive number of treatment Options and is a complaint that carries with it a high risk of complications and morbidity. The differential includes ***  I ordered, reviewed and interpreted labs, which included *** I ordered medication *** and reviewed PMP when indicated. I ordered imaging studies which included *** and I independently    visualized and interpreted imaging which showed *** Additional history obtained from *** Previous records obtained and reviewed *** I consulted *** and discussed lab and imaging findings and discussed disposition.  Cardiac monitoring reviewed, *** Social determinants considered, *** Critical Interventions: ***  After the interventions stated above, I reevaluated the patient and found *** Admission and further testing considered, ***   {Document critical care time when appropriate  Document review of labs and clinical decision tools ie CHADS2VASC2, etc  Document your independent review of  radiology images and any outside records  Document your discussion with family members, caretakers and with consultants  Document social determinants of health affecting pt's care  Document your decision making why or why not admission, treatments were needed:32947:::1}   Final diagnoses:  None    ED Discharge Orders     None

## 2024-03-26 ENCOUNTER — Encounter (HOSPITAL_COMMUNITY): Payer: Self-pay | Admitting: Family Medicine

## 2024-03-26 DIAGNOSIS — K219 Gastro-esophageal reflux disease without esophagitis: Secondary | ICD-10-CM | POA: Insufficient documentation

## 2024-03-26 DIAGNOSIS — Z953 Presence of xenogenic heart valve: Secondary | ICD-10-CM

## 2024-03-26 DIAGNOSIS — R531 Weakness: Secondary | ICD-10-CM | POA: Diagnosis not present

## 2024-03-26 DIAGNOSIS — R7989 Other specified abnormal findings of blood chemistry: Secondary | ICD-10-CM

## 2024-03-26 DIAGNOSIS — I5032 Chronic diastolic (congestive) heart failure: Secondary | ICD-10-CM | POA: Insufficient documentation

## 2024-03-26 DIAGNOSIS — N390 Urinary tract infection, site not specified: Secondary | ICD-10-CM

## 2024-03-26 DIAGNOSIS — L03115 Cellulitis of right lower limb: Secondary | ICD-10-CM

## 2024-03-26 DIAGNOSIS — N400 Enlarged prostate without lower urinary tract symptoms: Secondary | ICD-10-CM | POA: Insufficient documentation

## 2024-03-26 DIAGNOSIS — E785 Hyperlipidemia, unspecified: Secondary | ICD-10-CM | POA: Insufficient documentation

## 2024-03-26 DIAGNOSIS — E1142 Type 2 diabetes mellitus with diabetic polyneuropathy: Secondary | ICD-10-CM | POA: Insufficient documentation

## 2024-03-26 DIAGNOSIS — E039 Hypothyroidism, unspecified: Secondary | ICD-10-CM | POA: Insufficient documentation

## 2024-03-26 LAB — GLUCOSE, CAPILLARY
Glucose-Capillary: 139 mg/dL — ABNORMAL HIGH (ref 70–99)
Glucose-Capillary: 155 mg/dL — ABNORMAL HIGH (ref 70–99)
Glucose-Capillary: 193 mg/dL — ABNORMAL HIGH (ref 70–99)
Glucose-Capillary: 197 mg/dL — ABNORMAL HIGH (ref 70–99)

## 2024-03-26 LAB — BASIC METABOLIC PANEL WITH GFR
Anion gap: 14 (ref 5–15)
BUN: 100 mg/dL — ABNORMAL HIGH (ref 8–23)
CO2: 28 mmol/L (ref 22–32)
Calcium: 8.9 mg/dL (ref 8.9–10.3)
Chloride: 93 mmol/L — ABNORMAL LOW (ref 98–111)
Creatinine, Ser: 1.91 mg/dL — ABNORMAL HIGH (ref 0.61–1.24)
GFR, Estimated: 32 mL/min — ABNORMAL LOW (ref >60)
Glucose, Bld: 158 mg/dL — ABNORMAL HIGH (ref 70–99)
Potassium: 3.2 mmol/L — ABNORMAL LOW (ref 3.5–5.1)
Sodium: 136 mmol/L (ref 135–145)

## 2024-03-26 LAB — CBC
HCT: 29.1 % — ABNORMAL LOW (ref 39.0–52.0)
Hemoglobin: 10 g/dL — ABNORMAL LOW (ref 13.0–17.0)
MCH: 34.8 pg — ABNORMAL HIGH (ref 26.0–34.0)
MCHC: 34.4 g/dL (ref 30.0–36.0)
MCV: 101.4 fL — ABNORMAL HIGH (ref 80.0–100.0)
Platelets: 117 K/uL — ABNORMAL LOW (ref 150–400)
RBC: 2.87 MIL/uL — ABNORMAL LOW (ref 4.22–5.81)
RDW: 16.2 % — ABNORMAL HIGH (ref 11.5–15.5)
WBC: 4.9 K/uL (ref 4.0–10.5)
nRBC: 0 % (ref 0.0–0.2)

## 2024-03-26 LAB — URINE CULTURE: Culture: NO GROWTH

## 2024-03-26 MED ORDER — INSULIN ASPART 100 UNIT/ML IJ SOLN: 0.0000 [IU] | Freq: Every day | INTRAMUSCULAR | Status: DC

## 2024-03-26 MED ORDER — INSULIN ASPART 100 UNIT/ML IJ SOLN
0.0000 [IU] | Freq: Three times a day (TID) | INTRAMUSCULAR | Status: DC
  Administered 2024-03-26: 3 [IU] via SUBCUTANEOUS
  Administered 2024-03-26 – 2024-03-27 (×3): 2 [IU] via SUBCUTANEOUS
  Administered 2024-03-27: 3 [IU] via SUBCUTANEOUS
  Administered 2024-03-28: 2 [IU] via SUBCUTANEOUS
  Administered 2024-03-28 (×2): 3 [IU] via SUBCUTANEOUS
  Administered 2024-03-29 (×2): 2 [IU] via SUBCUTANEOUS
  Administered 2024-03-29: 3 [IU] via SUBCUTANEOUS
  Administered 2024-03-30: 2 [IU] via SUBCUTANEOUS
  Administered 2024-03-30: 3 [IU] via SUBCUTANEOUS

## 2024-03-26 MED ORDER — FUROSEMIDE 10 MG/ML IJ SOLN
40.0000 mg | Freq: Every day | INTRAMUSCULAR | Status: DC
  Administered 2024-03-26 – 2024-03-30 (×5): 40 mg via INTRAVENOUS
  Filled 2024-03-26 (×6): qty 4

## 2024-03-26 NOTE — Assessment & Plan Note (Signed)
-   Will continue Synthroid .

## 2024-03-26 NOTE — Assessment & Plan Note (Signed)
 The patient will be placed on supplement coverage with NovoLog . - Will continue Glucotrol .

## 2024-03-26 NOTE — Plan of Care (Signed)

## 2024-03-26 NOTE — Assessment & Plan Note (Signed)
Will continue Eliquis. 

## 2024-03-26 NOTE — Progress Notes (Signed)
 PROGRESS NOTE    William Walker  FMW:[id number] DOB: [birth year] DOA: 03/25/2024 PCP: Mancil Pfeiffer, PA-C   Brief Narrative:   89 y.o. Caucasian male with medical history significant for essential hypertension, coronary artery disease, urolithiasis, dyslipidemia, type diabetes mellitus with diabetic neuropathy who presented to the emergency room with acute onset of generalized weakness and worsening right foot and leg swelling with erythema with tenderness since Tuesday.  He was given outpatient antibiotic therapy with p.o. Keflex  which he has been taking without significant improvement.  Being treated with IV antibiotics. Needs PT/OT and possible SNF placement.  Assessment & Plan:  Principal Problem:   Generalized weakness Active Problems:   Cellulitis of right lower extremity   Elevated troponin I level   Acute lower UTI   Type 2 diabetes mellitus with peripheral neuropathy (HCC)   Hypothyroidism   GERD without esophagitis   Status post aortic valve replacement with bioprosthetic valve   BPH (benign prostatic hyperplasia)   Dyslipidemia   Chronic diastolic CHF (congestive heart failure) (HCC)   Generalized weakness/Physical deconditioning, falls at home,POA; - Ordered PT/OT -He already had HHPT but may need SNF placement at this point. Discussed that in length with his daughters at the bedside and they are on board with this plan.   Cellulitis of right lower extremity,POA: failed outpatient oral antibiotics - The patient will be placed on IV vancomycin  and cefepime. - Warm compresses will be utilized. - Pain management will be provided.   Acute lower UTI - f/u urine culture and sensitivity. - This could be contributing to his generalized weakness. - This should be covered by IV cefepime.   Elevated troponin I level - This is likely secondary to demand ischemia and has been coming down.   Chronic diastolic CHF,POA: NYHA III - The patient has no symptoms of clinical  exacerbation. - Will continue Toprol -XL. -continue with lasix  20 mg daily   Dyslipidemia - continue statin therapy.   BPH  - Will continue Flomax.   Status post aortic valve replacement with bioprosthetic valve - Will continue Eliquis .   GERD without esophagitis - Will continue PPI therapy.   Hypothyroidism - Will continue Synthroid .   Type 2 diabetes mellitus with peripheral neuropathy  The patient will be placed on supplement coverage with NovoLog . - Will continue Glucotrol .  Disposition: Lives at home with his wife and has HHPT. May need SNF placement.   DVT prophylaxis: apixaban  (ELIQUIS ) tablet 2.5 mg Start: 03/26/24 1000 apixaban  (ELIQUIS ) tablet 2.5 mg     Code Status: Full Code Family Communication:  daughters at the bedside Status is: Inpatient Remains inpatient appropriate because: cellulitis, falls at home    Subjective:  Discussed with two daughters at the bedside and they said that he has been getting progressively weaker and had multiple falls recently. He has a walker at home. He lives with his 44 years old wife.   Examination:  General exam: Appears calm and comfortable  Respiratory system: Clear to auscultation. Respiratory effort normal. Cardiovascular system: S1 & S2 heard, RRR. No JVD, murmurs, rubs, gallops or clicks. No pedal edema. Gastrointestinal system: Abdomen is nondistended, soft and nontender. No organomegaly or masses felt. Normal bowel sounds heard. Central nervous system: Alert and oriented. No focal neurological deficits. Extremities: Symmetric 5 x 5 power. Skin: b/l LE erythema and tenderness to palpation, b/l feet swelling      Diet Orders (From admission, onward)     Start     Ordered   03/26/24 0455  Diet Carb Modified Fluid consistency: Thin; Room service appropriate? Yes  Diet effective now       Question Answer Comment  Diet-HS Snack? Nothing   Calorie Level Medium 1600-2000   Fluid consistency: Thin   Room service  appropriate? Yes      03/26/24 0454            Objective: Vitals:   03/26/24 0138 03/26/24 0411 03/26/24 0800 03/26/24 0942  BP: (!) 112/58 (!) 117/49 (!) 116/44 111/60  Pulse: 60 (!) 58 60 (!) 59  Resp: 17 16 14    Temp: 98.3 F (36.8 C) 97.9 F (36.6 C) 98.2 F (36.8 C)   TempSrc:  Oral    SpO2: 95% 93% 94%   Weight:      Height:        Intake/Output Summary (Last 24 hours) at 03/26/2024 1006 Last data filed at 03/26/2024 0500 Gross per 24 hour  Intake 240 ml  Output 750 ml  Net -510 ml   Filed Weights   03/25/24 1646 03/25/24 2034  Weight: 92 kg 90.6 kg    Scheduled Meds:  apixaban   2.5 mg Oral BID   ascorbic acid  500 mg Oral Daily   furosemide   40 mg Intravenous Daily   hydrOXYzine  10 mg Oral QHS   insulin  aspart  0-15 Units Subcutaneous TID WC   insulin  aspart  0-5 Units Subcutaneous QHS   loratadine  10 mg Oral QHS   metolazone  2.5 mg Oral Daily   metoprolol  succinate  12.5 mg Oral Daily   multivitamin  1 tablet Oral Daily   multivitamin with minerals  1 tablet Oral Daily   pantoprazole   40 mg Oral Daily   potassium chloride  SA  20 mEq Oral Daily   pravastatin   40 mg Oral QHS   tamsulosin  0.4 mg Oral QPC supper   Continuous Infusions:  ceFEPime (MAXIPIME) IV 2 g (03/26/24 0954)   [START ON 03/27/2024] vancomycin       Nutritional status     Body mass index is 27.09 kg/m.  Data Reviewed:   CBC: Recent Labs  Lab 03/25/24 1729 03/26/24 0436  WBC 6.1 4.9  HGB 10.7* 10.0*  HCT 32.0* 29.1*  MCV 103.9* 101.4*  PLT 131* 117*   Basic Metabolic Panel: Recent Labs  Lab 03/25/24 1729 03/26/24 0436  NA 136 136  K 3.9 3.2*  CL 91* 93*  CO2 30 28  GLUCOSE 181* 158*  BUN 106* 100*  CREATININE 2.06* 1.91*  CALCIUM  9.4 8.9   GFR: Estimated Creatinine Clearance: 25.4 mL/min (A) (by C-G formula based on SCr of 1.91 mg/dL (H)). Liver Function Tests: Recent Labs  Lab 03/25/24 1729  AST 40  ALT 33  ALKPHOS 164*  BILITOT 1.1  PROT  7.0  ALBUMIN  4.2   No results for input(s): LIPASE, AMYLASE in the last 168 hours. No results for input(s): AMMONIA in the last 168 hours. Coagulation Profile: No results for input(s): INR, PROTIME in the last 168 hours. Cardiac Enzymes: No results for input(s): CKTOTAL, CKMB, CKMBINDEX, TROPONINI in the last 168 hours. BNP (last 3 results) No results for input(s): PROBNP in the last 8760 hours. HbA1C: Recent Labs    03/23/24 1454  HGBA1C 7.8*   CBG: Recent Labs  Lab 03/25/24 1659 03/26/24 0732  GLUCAP 198* 139*   Lipid Profile: Recent Labs    03/23/24 1454  CHOL 98*  HDL 44  LDLCALC 39  TRIG 67  CHOLHDL 2.2  Thyroid  Function Tests: No results for input(s): TSH, T4TOTAL, FREET4, T3FREE, THYROIDAB in the last 72 hours. Anemia Panel: No results for input(s): VITAMINB12, FOLATE, FERRITIN, TIBC, IRON, RETICCTPCT in the last 72 hours. Sepsis Labs: Recent Labs  Lab 03/25/24 1729  LATICACIDVEN 1.8    Recent Results (from the past 240 hours)  Resp panel by RT-PCR (RSV, Flu A&B, Covid) Urine, Clean Catch     Status: None   Collection Time: 03/25/24  5:28 PM   Specimen: Urine, Clean Catch; Nasal Swab  Result Value Ref Range Status   SARS Coronavirus 2 by RT PCR NEGATIVE NEGATIVE Final    Comment: (NOTE) SARS-CoV-2 target nucleic acids are NOT DETECTED.  The SARS-CoV-2 RNA is generally detectable in upper respiratory specimens during the acute phase of infection. The lowest concentration of SARS-CoV-2 viral copies this assay can detect is 138 copies/mL. A negative result does not preclude SARS-Cov-2 infection and should not be used as the sole basis for treatment or other patient management decisions. A negative result may occur with  improper specimen collection/handling, submission of specimen other than nasopharyngeal swab, presence of viral mutation(s) within the areas targeted by this assay, and inadequate number of  viral copies(<138 copies/mL). A negative result must be combined with clinical observations, patient history, and epidemiological information. The expected result is Negative.  Fact Sheet for Patients:  BloggerCourse.com  Fact Sheet for Healthcare Providers:  SeriousBroker.it  This test is no t yet approved or cleared by the [place]  FDA and  has been authorized for detection and/or diagnosis of SARS-CoV-2 by FDA under an Emergency Use Authorization (EUA). This EUA will remain  in effect (meaning this test can be used) for the duration of the COVID-19 declaration under Section 564(b)(1) of the Act, 21 U.S.C.section 360bbb-3(b)(1), unless the authorization is terminated  or revoked sooner.       Influenza A by PCR NEGATIVE NEGATIVE Final   Influenza B by PCR NEGATIVE NEGATIVE Final    Comment: (NOTE) The Xpert Xpress SARS-CoV-2/FLU/RSV plus assay is intended as an aid in the diagnosis of influenza from Nasopharyngeal swab specimens and should not be used as a sole basis for treatment. Nasal washings and aspirates are unacceptable for Xpert Xpress SARS-CoV-2/FLU/RSV testing.  Fact Sheet for Patients: BloggerCourse.com  Fact Sheet for Healthcare Providers: SeriousBroker.it  This test is not yet approved or cleared by the [place]  FDA and has been authorized for detection and/or diagnosis of SARS-CoV-2 by FDA under an Emergency Use Authorization (EUA). This EUA will remain in effect (meaning this test can be used) for the duration of the COVID-19 declaration under Section 564(b)(1) of the Act, 21 U.S.C. section 360bbb-3(b)(1), unless the authorization is terminated or revoked.     Resp Syncytial Virus by PCR NEGATIVE NEGATIVE Final    Comment: (NOTE) Fact Sheet for Patients: BloggerCourse.com  Fact Sheet for Healthcare  Providers: SeriousBroker.it  This test is not yet approved or cleared by the [place]  FDA and has been authorized for detection and/or diagnosis of SARS-CoV-2 by FDA under an Emergency Use Authorization (EUA). This EUA will remain in effect (meaning this test can be used) for the duration of the COVID-19 declaration under Section 564(b)(1) of the Act, 21 U.S.C. section 360bbb-3(b)(1), unless the authorization is terminated or revoked.  Performed at [facility], [address], [place], [place] [address]   Culture, blood (routine x 2)     Status: None (Preliminary result)   Collection Time: 03/25/24  5:29 PM  Specimen: BLOOD  Result Value Ref Range Status   Specimen Description BLOOD LEFT ANTECUBITAL  Final   Special Requests   Final    BOTTLES DRAWN AEROBIC AND ANAEROBIC Blood Culture adequate volume   Culture   Final    NO GROWTH < 24 HOURS Performed at [facility], [address] [place], [place] [address]    Report Status PENDING  Incomplete  Culture, blood (routine x 2)     Status: None (Preliminary result)   Collection Time: 03/25/24  5:41 PM   Specimen: BLOOD LEFT FOREARM  Result Value Ref Range Status   Specimen Description BLOOD LEFT FOREARM  Final   Special Requests   Final    BOTTLES DRAWN AEROBIC AND ANAEROBIC Blood Culture adequate volume   Culture   Final    NO GROWTH < 24 HOURS Performed at [facility], [address], [place], [place] [address]    Report Status PENDING  Incomplete         Radiology Studies: CT Head Wo Contrast Result Date: 03/25/2024 CLINICAL DATA:  Altered mental status. Weakness. Fall 5 days ago. Patient on Eliquis . EXAM: CT HEAD WITHOUT CONTRAST TECHNIQUE: Contiguous axial images were obtained from the base of the skull through the vertex without intravenous contrast. RADIATION DOSE REDUCTION: This exam was performed according to the departmental dose-optimization program which includes automated exposure  control, adjustment of the mA and/or kV according to patient size and/or use of iterative reconstruction technique. COMPARISON:  01/10/2024 FINDINGS: Brain: Ventricles, cisterns and other CSF spaces are within normal. There is mild age related atrophic change present. There is mild chronic ischemic microvascular disease. Small old lacunar infarct over the head of left caudate nucleus. Vascular: No hyperdense vessel or unexpected calcification. Skull: Normal. Negative for fracture or focal lesion. Sinuses/Orbits: No acute finding. Other: None. IMPRESSION: 1. No acute findings. 2. Mild age related atrophic change and chronic ischemic microvascular disease. Small old lacunar infarct over the head of the left caudate nucleus. Electronically Signed   By: Toribio Agreste M.D.   On: 03/25/2024 18:16   DG Chest 1 View Result Date: 03/25/2024 CLINICAL DATA:  Weakness. EXAM: CHEST  1 VIEW COMPARISON:  02/15/2024 FINDINGS: Patient slightly rotated to the left. Sternotomy wires unchanged. Prosthetic aortic valve unchanged. Lungs are adequately inflated with hazy bilateral perihilar and bibasilar opacification which may be due to interstitial edema with bilateral effusions/bibasilar atelectasis. Infection in the lung bases is possible. Mild stable cardiomegaly. Remainder of the exam is unchanged. IMPRESSION: 1. Hazy bilateral perihilar and bibasilar opacification likely due to interstitial edema with bilateral effusions/bibasilar atelectasis. Infection in the lung bases is possible. 2. Stable cardiomegaly. Electronically Signed   By: Toribio Agreste M.D.   On: 03/25/2024 17:41           LOS: 1 day   Time spent= 40 mins    Deliliah Room, MD [facility]  If 7PM-7AM, please contact night-coverage  03/26/2024, 10:06 AM

## 2024-03-26 NOTE — Assessment & Plan Note (Addendum)
-   The patient has no symptoms of clinical exacerbation. - Given chest x-ray findings we will place him on IV Lasix  though. - Will continue Toprol -XL.

## 2024-03-26 NOTE — TOC Initial Note (Signed)
 Transition of Care [facility]) - Initial/Assessment Note    Patient Details  Name: William Walker MRN: [id number] Date of Birth: 1927-03-19  [facility]) CM/SW Contact:    Noreen KATHEE Cleotilde ISRAEL Phone Number: 03/26/2024, 3:47 PM  Clinical Narrative:                  CSW spoke with patient daughter [place] and made her aware that PT recommendation has not been made at this time. Daughter shared that between her and her siblings, they help out. Patient has a BSC, shower chair, and rollator- but will need a hospital bed. Patient is currently active with [facility] for HHPT. HH has advised family to look into SNF for short-term rehab per daughter. Daughter stated that patient will likely want to go home but that they will discuss with patient about. CSW will continue to follow.    Barriers to Discharge: Continued Medical Work up   Patient Goals and CMS Choice Patient states their goals for this hospitalization and ongoing recovery are:: Patient wants to return home - family thinks he will benefit from SNF for ST rehab CMS Medicare.gov Compare Post Acute Care list provided to:: Patient Represenative (must comment) (Daughter- [place] Choice offered to / list presented to : Adult Children [place] ownership interest in [facility].provided to:: Adult Children    Expected Discharge Plan and Services In-house Referral: Clinical Social Work Discharge Planning Services: CM Consult Post Acute Care Choice: Durable Medical Equipment, Home Health Living arrangements for the past 2 months: Single Family Home                                      Prior Living Arrangements/Services Living arrangements for the past 2 months: Single Family Home Lives with:: Spouse Patient language and need for interpreter reviewed:: Yes Do you feel safe going back to the place where you live?: Yes      Need for Family Participation in Patient Care: Yes (Comment) Care giver support system  in place?: No (comment) Current home services: DME, Home PT Criminal Activity/Legal Involvement Pertinent to Current Situation/Hospitalization: No - Comment as needed  Activities of Daily Living   ADL Screening (condition at time of admission) Independently performs ADLs?: No Does the patient have a NEW difficulty with bathing/dressing/toileting/self-feeding that is expected to last >3 days?: Yes (Initiates electronic notice to provider for possible OT consult) Does the patient have a NEW difficulty with getting in/out of bed, walking, or climbing stairs that is expected to last >3 days?: Yes (Initiates electronic notice to provider for possible PT consult) Does the patient have a NEW difficulty with communication that is expected to last >3 days?: No Is the patient deaf or have difficulty hearing?: Yes Does the patient have difficulty seeing, even when wearing glasses/contacts?: No Does the patient have difficulty concentrating, remembering, or making decisions?: Yes  Permission Sought/Granted      Share Information with NAME: [place]     Permission granted to share info w Relationship: daughter     Emotional Assessment Appearance:: Appears stated age     Orientation: : Oriented to Self, Oriented to Place Alcohol / Substance Use: Tobacco Use Psych Involvement: No (comment)  Admission diagnosis:  Generalized weakness [R53.1] Patient Active Problem List   Diagnosis Date Noted   Cellulitis of right lower extremity 03/26/2024   Type 2 diabetes mellitus with peripheral neuropathy (HCC) 03/26/2024  Hypothyroidism 03/26/2024   GERD without esophagitis 03/26/2024   Status post aortic valve replacement with bioprosthetic valve 03/26/2024   BPH (benign prostatic hyperplasia) 03/26/2024   Dyslipidemia 03/26/2024   Elevated troponin I level 03/26/2024   Chronic diastolic CHF (congestive heart failure) (HCC) 03/26/2024   Acute lower UTI 03/26/2024   Generalized weakness 03/25/2024    Sleep-wake disorder 03/24/2024   Right foot injury, initial encounter 03/23/2024   Benign prostatic hyperplasia with urinary frequency 03/23/2024   Controlled type 2 diabetes mellitus with hyperglycemia, without long-term current use of insulin  (HCC)    Debility 12/07/2019   S/P aortic valve replacement with bioprosthetic valve 11/22/2019   Bilateral lower extremity edema    Macrocytosis without anemia    Severe aortic stenosis 02/05/2017   Occlusion and stenosis of carotid artery without mention of cerebral infarction 06/17/2013   Neuropathy due to secondary diabetes mellitus (HCC) 06/17/2013   Peripheral vascular disease of foot 04/01/2012   Bilateral carotid artery disease 04/01/2012   CAD, NATIVE VESSEL 01/25/2010   Hyperlipidemia with target LDL less than 70 12/23/2008   Essential hypertension 12/23/2008   PCP:  Mancil Pfeiffer, PA-C Pharmacy:   [facility]) - [place], [place] - [address] [place] [address] Phone: [phone] Fax: [phone]  [facility] [address], [place] - [address] [place] [address] [place] [address] Phone: [phone] Fax: [phone]  [facility] - [place], [place] - [address] [place] [address] Phone: [phone] Fax: [phone]     Social Drivers of Health (SDOH) Social History: SDOH Screenings   Food Insecurity: No Food Insecurity (03/25/2024)  Housing: Low Risk  (03/25/2024)  Transportation Needs: No Transportation Needs (03/25/2024)  Utilities: Not At Risk (03/25/2024)  Depression (PHQ2-9): High Risk (03/23/2024)  Financial Resource Strain: Low Risk  (03/23/2024)  Physical Activity: Inactive (03/23/2024)  Social Connections: Moderately Integrated (03/25/2024)  Recent Concern: Social Connections - Moderately Isolated (03/23/2024)  Stress: Stress Concern Present (03/23/2024)   Tobacco Use: High Risk (03/25/2024)   SDOH Interventions:     Readmission Risk Interventions    03/26/2024    3:45 PM  Readmission Risk Prevention Plan  Transportation Screening Complete  Home Care Screening Complete  Medication Review (RN CM) Complete

## 2024-03-26 NOTE — Assessment & Plan Note (Signed)
-   This is likely secondary to demand ischemia and has been coming down.

## 2024-03-26 NOTE — Assessment & Plan Note (Signed)
-   The patient will be placed on IV vancomycin  and cefepime. - Warm compresses will be utilized. - Pain management will be provided.

## 2024-03-26 NOTE — Assessment & Plan Note (Signed)
-   Will obtain urine culture and sensitivity. - This could be contributing to his generalized weakness. - This should be covered by IV cefepime.

## 2024-03-26 NOTE — Plan of Care (Signed)

## 2024-03-26 NOTE — Assessment & Plan Note (Signed)
 Will continue statin therapy

## 2024-03-26 NOTE — Assessment & Plan Note (Signed)
-   Will continue PPI therapy.

## 2024-03-26 NOTE — Assessment & Plan Note (Signed)
-   The patient will be admitted to the medical telemetry bed. - Physical therapy will be obtained given inability to ambulate. - Management otherwise as below.

## 2024-03-26 NOTE — Assessment & Plan Note (Signed)
Will continue Flomax.

## 2024-03-27 DIAGNOSIS — R531 Weakness: Secondary | ICD-10-CM

## 2024-03-27 LAB — GLUCOSE, CAPILLARY
Glucose-Capillary: 150 mg/dL — ABNORMAL HIGH (ref 70–99)
Glucose-Capillary: 200 mg/dL — ABNORMAL HIGH (ref 70–99)
Glucose-Capillary: 134 mg/dL — ABNORMAL HIGH (ref 70–99)
Glucose-Capillary: 190 mg/dL — ABNORMAL HIGH (ref 70–99)

## 2024-03-27 NOTE — Plan of Care (Signed)

## 2024-03-27 NOTE — Progress Notes (Signed)
 PROGRESS NOTE    DYLIN BREEDEN  FMW:[id number] DOB: 12-12-1926 DOA: 03/25/2024 PCP: Mancil Pfeiffer, PA-C   Brief Narrative:    89 y.o. Caucasian male with medical history significant for essential hypertension, coronary artery disease, urolithiasis, dyslipidemia, type diabetes mellitus with diabetic neuropathy who presented to the emergency room with acute onset of generalized weakness and worsening right foot and leg swelling with erythema with tenderness since Tuesday.  He was given outpatient antibiotic therapy with p.o. Keflex  which he has been taking without significant improvement.  Being treated with IV antibiotics. Needs PT/OT and possible SNF placement and evaluation is still pending.  Assessment & Plan:   Principal Problem:   Generalized weakness Active Problems:   Cellulitis of right lower extremity   Elevated troponin I level   Acute lower UTI   Type 2 diabetes mellitus with peripheral neuropathy (HCC)   Hypothyroidism   GERD without esophagitis   Status post aortic valve replacement with bioprosthetic valve   BPH (benign prostatic hyperplasia)   Dyslipidemia   Chronic diastolic CHF (congestive heart failure) (HCC)  Assessment and Plan:   Generalized weakness/Physical deconditioning, falls at home,POA; - Ordered PT/OT -He already had HHPT but may need SNF placement at this point. Discussed that in length with his daughters at the bedside and they are on board with this plan.   Cellulitis of right lower extremity,POA: failed outpatient oral antibiotics - The patient will be placed on IV vancomycin  and cefepime. - Warm compresses will be utilized. - Pain management will be provided.   Acute lower UTI - No growth noted on cultures - This could be contributing to his generalized weakness. - This should be covered by IV cefepime.   Elevated troponin I level - This is likely secondary to demand ischemia and has been coming down.   Chronic diastolic CHF,POA:  NYHA III - The patient has no symptoms of clinical exacerbation. - Will continue Toprol -XL. -continue with lasix  20 mg daily   Dyslipidemia - continue statin therapy.   BPH  - Will continue Flomax.   Status post aortic valve replacement with bioprosthetic valve - Will continue Eliquis .   GERD without esophagitis - Will continue PPI therapy.   Hypothyroidism - Will continue Synthroid .   Type 2 diabetes mellitus with peripheral neuropathy  The patient will be placed on supplement coverage with NovoLog . - Will continue Glucotrol .   Disposition: Lives at home with his wife and has HHPT. May need SNF placement.    DVT prophylaxis:apixaban  Code Status: Full Family Communication: Daughter at bedside 10/25 Disposition Plan:  Status is: Inpatient Remains inpatient appropriate because: Need for IV medications.   Consultants:  None  Procedures:  None  Antimicrobials:  Anti-infectives (From admission, onward)    Start     Dose/Rate Route Frequency Ordered Stop   03/27/24 2200  vancomycin  (VANCOREADY) IVPB 1500 mg/300 mL        1,500 mg 150 mL/hr over 120 Minutes Intravenous Every 48 hours 03/25/24 2110     03/26/24 0800  ceFEPIme (MAXIPIME) 2 g in sodium chloride  0.9 % 100 mL IVPB        2 g 200 mL/hr over 30 Minutes Intravenous Every 24 hours 03/25/24 2045     03/25/24 2145  vancomycin  (VANCOCIN ) IVPB 1000 mg/200 mL premix  Status:  Discontinued        1,000 mg 200 mL/hr over 60 Minutes Intravenous  Once 03/25/24 2045 03/25/24 2052   03/25/24 2145  vancomycin  (VANCOCIN ) IVPB 1000 mg/200  mL premix       Placed in And Linked Group   1,000 mg 200 mL/hr over 60 Minutes Intravenous  Once 03/25/24 2052 03/26/24 0747   03/25/24 2145  vancomycin  (VANCOCIN ) IVPB 1000 mg/200 mL premix       Placed in And Linked Group   1,000 mg 200 mL/hr over 60 Minutes Intravenous  Once 03/25/24 2052 03/26/24 0747   03/25/24 1930  cefTRIAXone  (ROCEPHIN ) 1 g in sodium chloride  0.9 % 100  mL IVPB        1 g 200 mL/hr over 30 Minutes Intravenous  Once 03/25/24 1922 03/26/24 0747       Subjective: Patient seen and evaluated today with no new acute complaints or concerns. No acute concerns or events noted overnight.  He continues to have lower extremity bilateral erythema with some swelling to his right lower extremity.  He has not yet worked with PT/OT.  Objective: Vitals:   03/26/24 2038 03/27/24 0357 03/27/24 0456 03/27/24 0846  BP: (!) 105/44 (!) 107/46 97/67 (!) 118/53  Pulse: (!) 59 60 (!) 59 60  Resp: 16 16 16    Temp: 98.1 F (36.7 C) 97.8 F (36.6 C) 98 F (36.7 C)   TempSrc: Oral Oral Oral   SpO2: 95% 96% 93%   Weight:      Height:        Intake/Output Summary (Last 24 hours) at 03/27/2024 1139 Last data filed at 03/27/2024 0945 Gross per 24 hour  Intake 340 ml  Output 800 ml  Net -460 ml   Filed Weights   03/25/24 1646 03/25/24 2034  Weight: 92 kg 90.6 kg    Examination:  General exam: Appears calm and comfortable  Respiratory system: Clear to auscultation. Respiratory effort normal. Cardiovascular system: S1 & S2 heard, RRR.  Gastrointestinal system: Abdomen is soft Central nervous system: Alert and awake Extremities: Bilateral lower extremity erythema with swelling to right lower extremity noted. Skin: No significant lesions noted Psychiatry: Flat affect.    Data Reviewed: I have personally reviewed following labs and imaging studies  CBC: Recent Labs  Lab 03/25/24 1729 03/26/24 0436  WBC 6.1 4.9  HGB 10.7* 10.0*  HCT 32.0* 29.1*  MCV 103.9* 101.4*  PLT 131* 117*   Basic Metabolic Panel: Recent Labs  Lab 03/25/24 1729 03/26/24 0436  NA 136 136  K 3.9 3.2*  CL 91* 93*  CO2 30 28  GLUCOSE 181* 158*  BUN 106* 100*  CREATININE 2.06* 1.91*  CALCIUM  9.4 8.9   GFR: Estimated Creatinine Clearance: 25.4 mL/min (A) (by C-G formula based on SCr of 1.91 mg/dL (H)). Liver Function Tests: Recent Labs  Lab 03/25/24 1729   AST 40  ALT 33  ALKPHOS 164*  BILITOT 1.1  PROT 7.0  ALBUMIN  4.2   No results for input(s): LIPASE, AMYLASE in the last 168 hours. No results for input(s): AMMONIA in the last 168 hours. Coagulation Profile: No results for input(s): INR, PROTIME in the last 168 hours. Cardiac Enzymes: No results for input(s): CKTOTAL, CKMB, CKMBINDEX, TROPONINI in the last 168 hours. BNP (last 3 results) No results for input(s): PROBNP in the last 8760 hours. HbA1C: No results for input(s): HGBA1C in the last 72 hours. CBG: Recent Labs  Lab 03/26/24 1152 03/26/24 1549 03/26/24 2035 03/27/24 0730 03/27/24 1104  GLUCAP 155* 193* 197* 150* 200*   Lipid Profile: No results for input(s): CHOL, HDL, LDLCALC, TRIG, CHOLHDL, LDLDIRECT in the last 72 hours. Thyroid  Function Tests: No results for  input(s): TSH, T4TOTAL, FREET4, T3FREE, THYROIDAB in the last 72 hours. Anemia Panel: No results for input(s): VITAMINB12, FOLATE, FERRITIN, TIBC, IRON, RETICCTPCT in the last 72 hours. Sepsis Labs: Recent Labs  Lab 03/25/24 1729  LATICACIDVEN 1.8    Recent Results (from the past 240 hours)  Resp panel by RT-PCR (RSV, Flu A&B, Covid) Urine, Clean Catch     Status: None   Collection Time: 03/25/24  5:28 PM   Specimen: Urine, Clean Catch; Nasal Swab  Result Value Ref Range Status   SARS Coronavirus 2 by RT PCR NEGATIVE NEGATIVE Final    Comment: (NOTE) SARS-CoV-2 target nucleic acids are NOT DETECTED.  The SARS-CoV-2 RNA is generally detectable in upper respiratory specimens during the acute phase of infection. The lowest concentration of SARS-CoV-2 viral copies this assay can detect is 138 copies/mL. A negative result does not preclude SARS-Cov-2 infection and should not be used as the sole basis for treatment or other patient management decisions. A negative result may occur with  improper specimen collection/handling, submission of  specimen other than nasopharyngeal swab, presence of viral mutation(s) within the areas targeted by this assay, and inadequate number of viral copies(<138 copies/mL). A negative result must be combined with clinical observations, patient history, and epidemiological information. The expected result is Negative.  Fact Sheet for Patients:  bloggercourse.com  Fact Sheet for Healthcare Providers:  seriousbroker.it  This test is no t yet approved or cleared by the [place]  FDA and  has been authorized for detection and/or diagnosis of SARS-CoV-2 by FDA under an Emergency Use Authorization (EUA). This EUA will remain  in effect (meaning this test can be used) for the duration of the COVID-19 declaration under Section 564(b)(1) of the Act, 21 U.S.C.section 360bbb-3(b)(1), unless the authorization is terminated  or revoked sooner.       Influenza A by PCR NEGATIVE NEGATIVE Final   Influenza B by PCR NEGATIVE NEGATIVE Final    Comment: (NOTE) The Xpert Xpress SARS-CoV-2/FLU/RSV plus assay is intended as an aid in the diagnosis of influenza from Nasopharyngeal swab specimens and should not be used as a sole basis for treatment. Nasal washings and aspirates are unacceptable for Xpert Xpress SARS-CoV-2/FLU/RSV testing.  Fact Sheet for Patients: bloggercourse.com  Fact Sheet for Healthcare Providers: seriousbroker.it  This test is not yet approved or cleared by the [place]  FDA and has been authorized for detection and/or diagnosis of SARS-CoV-2 by FDA under an Emergency Use Authorization (EUA). This EUA will remain in effect (meaning this test can be used) for the duration of the COVID-19 declaration under Section 564(b)(1) of the Act, 21 U.S.C. section 360bbb-3(b)(1), unless the authorization is terminated or revoked.     Resp Syncytial Virus by PCR NEGATIVE NEGATIVE Final     Comment: (NOTE) Fact Sheet for Patients: bloggercourse.com  Fact Sheet for Healthcare Providers: seriousbroker.it  This test is not yet approved or cleared by the [place]  FDA and has been authorized for detection and/or diagnosis of SARS-CoV-2 by FDA under an Emergency Use Authorization (EUA). This EUA will remain in effect (meaning this test can be used) for the duration of the COVID-19 declaration under Section 564(b)(1) of the Act, 21 U.S.C. section 360bbb-3(b)(1), unless the authorization is terminated or revoked.  Performed at [facility], [address], [place], [place] [address]   Urine Culture     Status: None   Collection Time: 03/25/24  5:28 PM   Specimen: Urine, Random  Result Value Ref Range Status  Specimen Description   Final    URINE, RANDOM Performed at [facility], [address], [place], [place] [address]    Special Requests   Final    NONE Reflexed from [id number] Performed at [facility], [address], [place], [place] [address]    Culture   Final    NO GROWTH Performed at [facility], [address], [place], [place] [address]    Report Status 03/26/2024 FINAL  Final  Culture, blood (routine x 2)     Status: None (Preliminary result)   Collection Time: 03/25/24  5:29 PM   Specimen: BLOOD  Result Value Ref Range Status   Specimen Description BLOOD LEFT ANTECUBITAL  Final   Special Requests   Final    BOTTLES DRAWN AEROBIC AND ANAEROBIC Blood Culture adequate volume   Culture   Final    NO GROWTH 2 DAYS Performed at [facility], [address] [place], [place] [address]    Report Status PENDING  Incomplete  Culture, blood (routine x 2)     Status: None (Preliminary result)   Collection Time: 03/25/24  5:41 PM   Specimen: BLOOD LEFT FOREARM  Result Value Ref Range Status   Specimen Description BLOOD LEFT FOREARM  Final   Special Requests   Final    BOTTLES DRAWN AEROBIC AND  ANAEROBIC Blood Culture adequate volume   Culture   Final    NO GROWTH 2 DAYS Performed at [facility], [address], [place], [place] [address]    Report Status PENDING  Incomplete         Radiology Studies: CT Head Wo Contrast Result Date: 03/25/2024 CLINICAL DATA:  Altered mental status. Weakness. Fall 5 days ago. Patient on Eliquis . EXAM: CT HEAD WITHOUT CONTRAST TECHNIQUE: Contiguous axial images were obtained from the base of the skull through the vertex without intravenous contrast. RADIATION DOSE REDUCTION: This exam was performed according to the departmental dose-optimization program which includes automated exposure control, adjustment of the mA and/or kV according to patient size and/or use of iterative reconstruction technique. COMPARISON:  01/10/2024 FINDINGS: Brain: Ventricles, cisterns and other CSF spaces are within normal. There is mild age related atrophic change present. There is mild chronic ischemic microvascular disease. Small old lacunar infarct over the head of left caudate nucleus. Vascular: No hyperdense vessel or unexpected calcification. Skull: Normal. Negative for fracture or focal lesion. Sinuses/Orbits: No acute finding. Other: None. IMPRESSION: 1. No acute findings. 2. Mild age related atrophic change and chronic ischemic microvascular disease. Small old lacunar infarct over the head of the left caudate nucleus. Electronically Signed   By: Toribio Agreste M.D.   On: 03/25/2024 18:16   DG Chest 1 View Result Date: 03/25/2024 CLINICAL DATA:  Weakness. EXAM: CHEST  1 VIEW COMPARISON:  02/15/2024 FINDINGS: Patient slightly rotated to the left. Sternotomy wires unchanged. Prosthetic aortic valve unchanged. Lungs are adequately inflated with hazy bilateral perihilar and bibasilar opacification which may be due to interstitial edema with bilateral effusions/bibasilar atelectasis. Infection in the lung bases is possible. Mild stable cardiomegaly. Remainder of the exam is  unchanged. IMPRESSION: 1. Hazy bilateral perihilar and bibasilar opacification likely due to interstitial edema with bilateral effusions/bibasilar atelectasis. Infection in the lung bases is possible. 2. Stable cardiomegaly. Electronically Signed   By: Toribio Agreste M.D.   On: 03/25/2024 17:41        Scheduled Meds:  apixaban   2.5 mg Oral BID   ascorbic acid  500 mg Oral Daily  furosemide   40 mg Intravenous Daily   hydrOXYzine  10 mg Oral QHS   insulin  aspart  0-15 Units Subcutaneous TID WC   insulin  aspart  0-5 Units Subcutaneous QHS   loratadine  10 mg Oral QHS   metolazone  2.5 mg Oral Daily   metoprolol  succinate  12.5 mg Oral Daily   multivitamin  1 tablet Oral Daily   multivitamin with minerals  1 tablet Oral Daily   pantoprazole   40 mg Oral Daily   potassium chloride  SA  20 mEq Oral Daily   pravastatin   40 mg Oral QHS   tamsulosin  0.4 mg Oral QPC supper   Continuous Infusions:  ceFEPime (MAXIPIME) IV 2 g (03/27/24 0833)   vancomycin        LOS: 2 days    Time spent: 55 minutes    Shantell Belongia JONETTA Fairly, DO [facility]  If 7PM-7AM, please contact night-coverage [email or web address] 03/27/2024, 11:39 AM ]

## 2024-03-27 NOTE — Evaluation (Signed)
 Physical Therapy Evaluation Patient Details Name: William Walker MRN: [id number] DOB: 1926-06-09 Today's Date: 03/27/2024  History of Present Illness  William Walker is a 89 y.o. Caucasian male with medical history significant for essential hypertension, coronary artery disease, urolithiasis, dyslipidemia, type diabetes mellitus with diabetic neuropathy who presented to the emergency room with acute onset of generalized weakness and worsening right foot and leg swelling with erythema with tenderness since Tuesday.  She was given outpatient antibiotic therapy with p.o. Keflex  which she has been taking without significant improvement.  She admits to urinary frequency and urgency without dysuria or hematuria or flank pain.  No cough or wheezing or dyspnea.  She has no chest pain or palpitations.  The patient has been having recurrent falls.   Clinical Impression  Patient agreeable to PT Evaluation. Patient's daughter present during session and contributes to subjective history. Patient and daughter report prior to Thursday, patient was able to walk in home with RW but since has not been able to even stand. Also reports patient is assisted with ADLs. Patient and daughter report pt was receiving home health PT services who was able to walk with him Monday but unable to on Thursday as well. On this date, pt is max assist (2+ for adequate safety - pt's daughter assists as needed) with all bed mobility, functional transfers and very short ambulation with RW. Pt able to bear weight throughout feet this date but not for more than 3 or so minutes. Both feet are very swollen and red, pt reports not painful to touch this date. Pt returns to bed at end of evaluation, assists with boosting to head of bed with bridging, call button within reach, bed alarm set, and daughter present. Patient will benefit from continued skilled physical therapy acutely and in recommended venue in order to address current deficits and improve  overall function.        If plan is discharge home, recommend the following: A lot of help with walking and/or transfers;A lot of help with bathing/dressing/bathroom;Assist for transportation;Help with stairs or ramp for entrance;Assistance with cooking/housework   Can travel by private vehicle        Equipment Recommendations None recommended by PT  Recommendations for Other Services       Functional Status Assessment Patient has had a recent decline in their functional status and demonstrates the ability to make significant improvements in function in a reasonable and predictable amount of time.     Precautions / Restrictions Precautions Precautions: Fall Recall of Precautions/Restrictions: Intact Restrictions Weight Bearing Restrictions Per Provider Order: No      Mobility  Bed Mobility Overal bed mobility: Needs Assistance Bed Mobility: Supine to Sit, Sit to Supine     Supine to sit: Max assist, +2 for safety/equipment, +2 for physical assistance Sit to supine: Max assist, +2 for physical assistance, +2 for safety/equipment   General bed mobility comments: Assist needed for LE handling as well as trunk, pt able to initiate transfers with verbal cueing but demonstrates tendency to lean posteriorly inc fall risk. PT blocking at knees to ensure safety from sliding forward and giving verbal and tactile cues for forward lean throughout. Pt daughter assists as needed. Pt demo some shakiness during transfers as well, resembling intention tremors.    Transfers Overall transfer level: Needs assistance Equipment used: Rolling walker (2 wheels) Transfers: Sit to/from Stand Sit to Stand: Max assist           General transfer comment: Attmepted STS  with RW from normal height bed, able to stand 2 times during session with RW, elevated bed height, and max assist. pt continues posterior lean when sitting, verbal and tactile cueing for correction to prevent fall risk. lowered bed  height during sit with improvements.    Ambulation/Gait Ambulation/Gait assistance: Max assist Gait Distance (Feet): 2 Feet Assistive device: Rolling walker (2 wheels) Gait Pattern/deviations: Step-to pattern, Decreased step length - right, Decreased step length - left, Leaning posteriorly, Decreased stride length, Staggering right, Knees buckling Gait velocity: Dec     General Gait Details: Limited to a few side steps towards HOB with RW and max assist due to LE weakness, and pain as well as posterior lea throughout. Pt very shaky and unsteady on feet and mild knee buckling occuring throughout.  Stairs            Wheelchair Mobility     Tilt Bed    Modified Rankin (Stroke Patients Only)       Balance Overall balance assessment: Needs assistance Sitting-balance support: Feet supported, Bilateral upper extremity supported Sitting balance-Leahy Scale: Poor Sitting balance - Comments: Seated EOB, can maintain seated balance without support for 10-15 seconds before assist required due to posterior lean Postural control: Posterior lean Standing balance support: During functional activity, Reliant on assistive device for balance, Bilateral upper extremity supported Standing balance-Leahy Scale: Zero Standing balance comment: w/ RW           Pertinent Vitals/Pain Pain Assessment Pain Assessment: No/denies pain    Home Living Family/patient expects to be discharged to:: Private residence Living Arrangements: Spouse/significant other Available Help at Discharge: Family;Available PRN/intermittently Type of Home: House Home Access: Ramped entrance;Stairs to enter Entrance Stairs-Rails: Right Entrance Stairs-Number of Steps: 3 steps in back   Home Layout: Two level;Able to live on main level with bedroom/bathroom Home Equipment: Shower seat;Grab bars - tub/shower;BSC/3in1;Rolling Walker (2 wheels)      Prior Function Prior Level of Function : Needs assist;History of  Falls (last six months)       Physical Assist : Mobility (physical);ADLs (physical) Mobility (physical): Transfers;Gait;Bed mobility ADLs (physical): IADLs;Dressing;Bathing Mobility Comments: pt daughter reports previously, pt was able to ambualte in home with RW but with inc foot pain bilaterally these last few days, pt has not been able to stand, HHPT was able to walk small distance with pt on Monday and unable to get him up on thursday, has had a few falls ADLs Comments: daughter reports pt independent with toileting but assisted with bathing and dressing. Daughter also assists pt's wife with ADLs     Extremity/Trunk Assessment   Upper Extremity Assessment Upper Extremity Assessment: Defer to OT evaluation    Lower Extremity Assessment Lower Extremity Assessment: Generalized weakness (Did not formally test but pt demonstrates generalized LE weakness which may be due to inc pain this date. Pt unable to stand/bear full weight into legs from sitting at lower height, BLE assisted during bed mobility)    Cervical / Trunk Assessment Cervical / Trunk Assessment: Kyphotic  Communication   Communication Communication: No apparent difficulties    Cognition Arousal: Alert Behavior During Therapy: WFL for tasks assessed/performed     Following commands: Impaired Following commands impaired: Follows one step commands inconsistently     Cueing Cueing Techniques: Verbal cues, Tactile cues, Visual cues     General Comments      Exercises     Assessment/Plan    PT Assessment Patient needs continued PT services;All further PT needs can be  met in the next venue of care  PT Problem List Decreased strength;Decreased range of motion;Decreased mobility;Decreased balance;Decreased activity tolerance       PT Treatment Interventions DME instruction;Gait training;Stair training;Therapeutic activities;Therapeutic exercise;Functional mobility training;Patient/family education;Balance  training    PT Goals (Current goals can be found in the Care Plan section)  Acute Rehab PT Goals Patient Stated Goal: Return home following short rehab stay PT Goal Formulation: With patient Time For Goal Achievement: 04/09/24 Potential to Achieve Goals: Good    Frequency Min 3X/week     Co-evaluation               AM-PAC PT 6 Clicks Mobility  Outcome Measure Help needed turning from your back to your side while in a flat bed without using bedrails?: A Lot Help needed moving from lying on your back to sitting on the side of a flat bed without using bedrails?: A Lot Help needed moving to and from a bed to a chair (including a wheelchair)?: A Lot Help needed standing up from a chair using your arms (e.g., wheelchair or bedside chair)?: A Lot Help needed to walk in hospital room?: A Lot Help needed climbing 3-5 steps with a railing? : Total 6 Click Score: 11    End of Session Equipment Utilized During Treatment: Gait belt Activity Tolerance: Patient tolerated treatment well;Patient limited by fatigue Patient left: in bed;with call bell/phone within reach;with bed alarm set;with family/visitor present   PT Visit Diagnosis: Unsteadiness on feet (R26.81);Other abnormalities of gait and mobility (R26.89);History of falling (Z91.81);Muscle weakness (generalized) (M62.81);Difficulty in walking, not elsewhere classified (R26.2)    Time: 9069-9043 PT Time Calculation (min) (ACUTE ONLY): 26 min   Charges:   PT Evaluation $PT Eval Moderate Complexity: 1 Mod   PT General Charges $$ ACUTE PT VISIT: 1 Visit         11:46 AM, 03/27/24 Rosaria Settler, PT, DPT [place] [facility]

## 2024-03-27 NOTE — Plan of Care (Signed)
  Problem: Acute Rehab PT Goals(only PT should resolve) Goal: Pt Will Go Supine/Side To Sit Outcome: Progressing Flowsheets (Taken 03/27/2024 1148) Pt will go Supine/Side to Sit: with moderate assist Goal: Patient Will Perform Sitting Balance Outcome: Progressing Flowsheets (Taken 03/27/2024 1148) Patient will perform sitting balance: with minimal assist Goal: Patient Will Transfer Sit To/From Stand Outcome: Progressing Flowsheets (Taken 03/27/2024 1148) Patient will transfer sit to/from stand: with moderate assist Goal: Pt Will Transfer Bed To Chair/Chair To Bed Outcome: Progressing Flowsheets (Taken 03/27/2024 1148) Pt will Transfer Bed to Chair/Chair to Bed: with mod assist Goal: Pt Will Ambulate Outcome: Progressing Flowsheets (Taken 03/27/2024 1148) Pt will Ambulate:  10 feet  with rolling walker  with minimal assist    11:49 AM, 03/27/24 Rosaria Settler, PT, DPT  with [facility]

## 2024-03-28 DIAGNOSIS — R531 Weakness: Secondary | ICD-10-CM | POA: Diagnosis not present

## 2024-03-28 LAB — BASIC METABOLIC PANEL WITH GFR
Anion gap: 18 — ABNORMAL HIGH (ref 5–15)
BUN: 106 mg/dL — ABNORMAL HIGH (ref 8–23)
CO2: 26 mmol/L (ref 22–32)
Calcium: 9.1 mg/dL (ref 8.9–10.3)
Chloride: 92 mmol/L — ABNORMAL LOW (ref 98–111)
Creatinine, Ser: 2.19 mg/dL — ABNORMAL HIGH (ref 0.61–1.24)
GFR, Estimated: 27 mL/min — ABNORMAL LOW (ref >60)
Glucose, Bld: 145 mg/dL — ABNORMAL HIGH (ref 70–99)
Potassium: 3.5 mmol/L (ref 3.5–5.1)
Sodium: 135 mmol/L (ref 135–145)

## 2024-03-28 LAB — CBC
HCT: 30.5 % — ABNORMAL LOW (ref 39.0–52.0)
Hemoglobin: 10.1 g/dL — ABNORMAL LOW (ref 13.0–17.0)
MCH: 33.9 pg (ref 26.0–34.0)
MCHC: 33.1 g/dL (ref 30.0–36.0)
MCV: 102.3 fL — ABNORMAL HIGH (ref 80.0–100.0)
Platelets: 112 K/uL — ABNORMAL LOW (ref 150–400)
RBC: 2.98 MIL/uL — ABNORMAL LOW (ref 4.22–5.81)
RDW: 16.5 % — ABNORMAL HIGH (ref 11.5–15.5)
WBC: 5.9 K/uL (ref 4.0–10.5)
nRBC: 0 % (ref 0.0–0.2)

## 2024-03-28 LAB — GLUCOSE, CAPILLARY
Glucose-Capillary: 144 mg/dL — ABNORMAL HIGH (ref 70–99)
Glucose-Capillary: 190 mg/dL — ABNORMAL HIGH (ref 70–99)
Glucose-Capillary: 170 mg/dL — ABNORMAL HIGH (ref 70–99)
Glucose-Capillary: 162 mg/dL — ABNORMAL HIGH (ref 70–99)

## 2024-03-28 LAB — MAGNESIUM: Magnesium: 2.4 mg/dL (ref 1.7–2.4)

## 2024-03-28 MED ORDER — NICOTINE 7 MG/24HR TD PT24
7.0000 mg | MEDICATED_PATCH | Freq: Every day | TRANSDERMAL | Status: DC
  Administered 2024-03-28 – 2024-03-30 (×3): 7 mg via TRANSDERMAL
  Filled 2024-03-28 (×3): qty 1

## 2024-03-28 NOTE — TOC Progression Note (Signed)
 Transition of Care [facility]) - Progression Note    Patient Details  Name: William Walker MRN: [id number] Date of Birth: 17-Jan-1927  Transition of Care [facility]) CM/SW Contact  Hoy DELENA Bigness, LCSW Phone Number: 03/28/2024, 1:32 PM  Clinical Narrative:    Pt only oriented x 2. Spoke with pt's daughter, Suzen via t/c to discuss recommendation for SNF. Pt's daughter is agreeable to SNF placement and prefers placement at [facility]. Referrals have been faxed out and currently awaiting bed offers.      Barriers to Discharge: Continued Medical Work up               Expected Discharge Plan and Services In-house Referral: Clinical Social Work Discharge Planning Services: CM Consult Post Acute Care Choice: Horticulturist, Commercial, Home Health Living arrangements for the past 2 months: Single Family Home                                       Social Drivers of Health (SDOH) Interventions SDOH Screenings   Food Insecurity: No Food Insecurity (03/25/2024)  Housing: Low Risk  (03/25/2024)  Transportation Needs: No Transportation Needs (03/25/2024)  Utilities: Not At Risk (03/25/2024)  Depression (PHQ2-9): High Risk (03/23/2024)  Financial Resource Strain: Low Risk  (03/23/2024)  Physical Activity: Inactive (03/23/2024)  Social Connections: Moderately Integrated (03/25/2024)  Recent Concern: Social Connections - Moderately Isolated (03/23/2024)  Stress: Stress Concern Present (03/23/2024)  Tobacco Use: High Risk (03/25/2024)    Readmission Risk Interventions    03/26/2024    3:45 PM  Readmission Risk Prevention Plan  Transportation Screening Complete  Home Care Screening Complete  Medication Review (RN CM) Complete

## 2024-03-28 NOTE — Plan of Care (Signed)

## 2024-03-28 NOTE — Plan of Care (Signed)
   Problem: Education: Goal: Knowledge of General Education information will improve Description: Including pain rating scale, medication(s)/side effects and non-pharmacologic comfort measures Outcome: Progressing   Problem: Clinical Measurements: Goal: Ability to maintain clinical measurements within normal limits will improve Outcome: Progressing   Problem: Nutrition: Goal: Adequate nutrition will be maintained Outcome: Progressing

## 2024-03-28 NOTE — Progress Notes (Signed)
 PROGRESS NOTE    William Walker  FMW:[id number] DOB: [birth year] DOA: 03/25/2024 PCP: Mancil Pfeiffer, PA-C   Brief Narrative:    89 y.o. Caucasian male with medical history significant for essential hypertension, coronary artery disease, urolithiasis, dyslipidemia, type diabetes mellitus with diabetic neuropathy who presented to the emergency room with acute onset of generalized weakness and worsening right foot and leg swelling with erythema with tenderness since Tuesday.  He was given outpatient antibiotic therapy with p.o. Keflex  which he has been taking without significant improvement.  Being treated with IV antibiotics. PT/OT recommending SNF and placement pending.  Cellulitis is improving.  Assessment & Plan:   Principal Problem:   Generalized weakness Active Problems:   Cellulitis of right lower extremity   Elevated troponin I level   Acute lower UTI   Type 2 diabetes mellitus with peripheral neuropathy (HCC)   Hypothyroidism   GERD without esophagitis   Status post aortic valve replacement with bioprosthetic valve   BPH (benign prostatic hyperplasia)   Dyslipidemia   Chronic diastolic CHF (congestive heart failure) (HCC)  Assessment and Plan:   Generalized weakness/Physical deconditioning, falls at home,POA; - PT/OT recommending SNF placement   Cellulitis of right lower extremity,POA: failed outpatient oral antibiotics - The patient will be placed on IV vancomycin  and cefepime. - Warm compresses will be utilized. - Pain management will be provided.   Acute lower UTI - No growth noted on cultures - This could be contributing to his generalized weakness. - This should be covered by IV cefepime.   Elevated troponin I level - This is likely secondary to demand ischemia and has been coming down.   Chronic diastolic CHF,POA: NYHA III - The patient has no symptoms of clinical exacerbation. - Will continue Toprol -XL. -continue with lasix  20 mg daily    Dyslipidemia - continue statin therapy.   BPH  - Will continue Flomax.   Status post aortic valve replacement with bioprosthetic valve - Will continue Eliquis .   GERD without esophagitis - Will continue PPI therapy.   Hypothyroidism - Will continue Synthroid .   Type 2 diabetes mellitus with peripheral neuropathy  The patient will be placed on supplement coverage with NovoLog . - Will continue Glucotrol .   Disposition: Lives at home with his wife and has HHPT. May need SNF placement.    DVT prophylaxis:apixaban  Code Status: Full Family Communication: Daughter at bedside 10/26 Disposition Plan:  Status is: Inpatient Remains inpatient appropriate because: Need for IV medications.   Consultants:  None  Procedures:  None  Antimicrobials:  Anti-infectives (From admission, onward)    Start     Dose/Rate Route Frequency Ordered Stop   03/27/24 2200  vancomycin  (VANCOREADY) IVPB 1500 mg/300 mL        1,500 mg 150 mL/hr over 120 Minutes Intravenous Every 48 hours 03/25/24 2110     03/26/24 0800  ceFEPIme (MAXIPIME) 2 g in sodium chloride  0.9 % 100 mL IVPB        2 g 200 mL/hr over 30 Minutes Intravenous Every 24 hours 03/25/24 2045     03/25/24 2145  vancomycin  (VANCOCIN ) IVPB 1000 mg/200 mL premix  Status:  Discontinued        1,000 mg 200 mL/hr over 60 Minutes Intravenous  Once 03/25/24 2045 03/25/24 2052   03/25/24 2145  vancomycin  (VANCOCIN ) IVPB 1000 mg/200 mL premix       Placed in And Linked Group   1,000 mg 200 mL/hr over 60 Minutes Intravenous  Once 03/25/24 2052 03/26/24 0747  03/25/24 2145  vancomycin  (VANCOCIN ) IVPB 1000 mg/200 mL premix       Placed in And Linked Group   1,000 mg 200 mL/hr over 60 Minutes Intravenous  Once 03/25/24 2052 03/26/24 0747   03/25/24 1930  cefTRIAXone  (ROCEPHIN ) 1 g in sodium chloride  0.9 % 100 mL IVPB        1 g 200 mL/hr over 30 Minutes Intravenous  Once 03/25/24 1922 03/26/24 0747       Subjective: Patient seen  and evaluated today with some agitation noted today as he normally chews on tobacco and family members at bedside requesting nicotine patch.  Cellulitis appears to be improving along with swelling.  PT has recommended SNF.  Objective: Vitals:   03/27/24 0846 03/27/24 1412 03/27/24 2128 03/28/24 0541  BP: (!) 118/53 103/63 132/62 (!) 115/49  Pulse: 60 (!) 59 72 62  Resp:   20 18  Temp:  (!) 97.3 F (36.3 C) 98.1 F (36.7 C) 98.7 F (37.1 C)  TempSrc:  Oral Oral Oral  SpO2:  93% 91% 98%  Weight:      Height:        Intake/Output Summary (Last 24 hours) at 03/28/2024 1025 Last data filed at 03/28/2024 0547 Gross per 24 hour  Intake 240 ml  Output 1400 ml  Net -1160 ml   Filed Weights   03/25/24 1646 03/25/24 2034  Weight: 92 kg 90.6 kg    Examination:  General exam: Appears calm and comfortable  Respiratory system: Clear to auscultation. Respiratory effort normal. Cardiovascular system: S1 & S2 heard, RRR.  Gastrointestinal system: Abdomen is soft Central nervous system: Alert and awake Extremities: Bilateral lower extremity erythema with swelling to right lower extremity noted. Skin: No significant lesions noted Psychiatry: Flat affect.    Data Reviewed: I have personally reviewed following labs and imaging studies  CBC: Recent Labs  Lab 03/25/24 1729 03/26/24 0436 03/28/24 0457  WBC 6.1 4.9 5.9  HGB 10.7* 10.0* 10.1*  HCT 32.0* 29.1* 30.5*  MCV 103.9* 101.4* 102.3*  PLT 131* 117* 112*   Basic Metabolic Panel: Recent Labs  Lab 03/25/24 1729 03/26/24 0436 03/28/24 0457  NA 136 136 135  K 3.9 3.2* 3.5  CL 91* 93* 92*  CO2 30 28 26   GLUCOSE 181* 158* 145*  BUN 106* 100* 106*  CREATININE 2.06* 1.91* 2.19*  CALCIUM  9.4 8.9 9.1  MG  --   --  2.4   GFR: Estimated Creatinine Clearance: 22.1 mL/min (A) (by C-G formula based on SCr of 2.19 mg/dL (H)). Liver Function Tests: Recent Labs  Lab 03/25/24 1729  AST 40  ALT 33  ALKPHOS 164*  BILITOT 1.1   PROT 7.0  ALBUMIN  4.2   No results for input(s): LIPASE, AMYLASE in the last 168 hours. No results for input(s): AMMONIA in the last 168 hours. Coagulation Profile: No results for input(s): INR, PROTIME in the last 168 hours. Cardiac Enzymes: No results for input(s): CKTOTAL, CKMB, CKMBINDEX, TROPONINI in the last 168 hours. BNP (last 3 results) No results for input(s): PROBNP in the last 8760 hours. HbA1C: No results for input(s): HGBA1C in the last 72 hours. CBG: Recent Labs  Lab 03/27/24 0730 03/27/24 1104 03/27/24 1610 03/27/24 2116 03/28/24 0707  GLUCAP 150* 200* 134* 190* 144*   Lipid Profile: No results for input(s): CHOL, HDL, LDLCALC, TRIG, CHOLHDL, LDLDIRECT in the last 72 hours. Thyroid  Function Tests: No results for input(s): TSH, T4TOTAL, FREET4, T3FREE, THYROIDAB in the last 72 hours. Anemia  Panel: No results for input(s): VITAMINB12, FOLATE, FERRITIN, TIBC, IRON, RETICCTPCT in the last 72 hours. Sepsis Labs: Recent Labs  Lab 03/25/24 1729  LATICACIDVEN 1.8    Recent Results (from the past 240 hours)  Resp panel by RT-PCR (RSV, Flu A&B, Covid) Urine, Clean Catch     Status: None   Collection Time: 03/25/24  5:28 PM   Specimen: Urine, Clean Catch; Nasal Swab  Result Value Ref Range Status   SARS Coronavirus 2 by RT PCR NEGATIVE NEGATIVE Final    Comment: (NOTE) SARS-CoV-2 target nucleic acids are NOT DETECTED.  The SARS-CoV-2 RNA is generally detectable in upper respiratory specimens during the acute phase of infection. The lowest concentration of SARS-CoV-2 viral copies this assay can detect is 138 copies/mL. A negative result does not preclude SARS-Cov-2 infection and should not be used as the sole basis for treatment or other patient management decisions. A negative result may occur with  improper specimen collection/handling, submission of specimen other than nasopharyngeal swab, presence of  viral mutation(s) within the areas targeted by this assay, and inadequate number of viral copies(<138 copies/mL). A negative result must be combined with clinical observations, patient history, and epidemiological information. The expected result is Negative.  Fact Sheet for Patients:  bloggercourse.com  Fact Sheet for Healthcare Providers:  seriousbroker.it  This test is no t yet approved or cleared by the [place]  FDA and  has been authorized for detection and/or diagnosis of SARS-CoV-2 by FDA under an Emergency Use Authorization (EUA). This EUA will remain  in effect (meaning this test can be used) for the duration of the COVID-19 declaration under Section 564(b)(1) of the Act, 21 U.S.C.section 360bbb-3(b)(1), unless the authorization is terminated  or revoked sooner.       Influenza A by PCR NEGATIVE NEGATIVE Final   Influenza B by PCR NEGATIVE NEGATIVE Final    Comment: (NOTE) The Xpert Xpress SARS-CoV-2/FLU/RSV plus assay is intended as an aid in the diagnosis of influenza from Nasopharyngeal swab specimens and should not be used as a sole basis for treatment. Nasal washings and aspirates are unacceptable for Xpert Xpress SARS-CoV-2/FLU/RSV testing.  Fact Sheet for Patients: bloggercourse.com  Fact Sheet for Healthcare Providers: seriousbroker.it  This test is not yet approved or cleared by the [place]  FDA and has been authorized for detection and/or diagnosis of SARS-CoV-2 by FDA under an Emergency Use Authorization (EUA). This EUA will remain in effect (meaning this test can be used) for the duration of the COVID-19 declaration under Section 564(b)(1) of the Act, 21 U.S.C. section 360bbb-3(b)(1), unless the authorization is terminated or revoked.     Resp Syncytial Virus by PCR NEGATIVE NEGATIVE Final    Comment: (NOTE) Fact Sheet for  Patients: bloggercourse.com  Fact Sheet for Healthcare Providers: seriousbroker.it  This test is not yet approved or cleared by the [place]  FDA and has been authorized for detection and/or diagnosis of SARS-CoV-2 by FDA under an Emergency Use Authorization (EUA). This EUA will remain in effect (meaning this test can be used) for the duration of the COVID-19 declaration under Section 564(b)(1) of the Act, 21 U.S.C. section 360bbb-3(b)(1), unless the authorization is terminated or revoked.  Performed at [facility], [address], [place], [place] [address]   Urine Culture     Status: None   Collection Time: 03/25/24  5:28 PM   Specimen: Urine, Random  Result Value Ref Range Status   Specimen Description   Final    URINE, RANDOM Performed at  [facility], [address], [place], [place] [address]    Special Requests   Final    NONE Reflexed from [id number] Performed at [facility], [address], [place], [place] [address]    Culture   Final    NO GROWTH Performed at [facility], [address], [place], [place] [address]    Report Status 03/26/2024 FINAL  Final  Culture, blood (routine x 2)     Status: None (Preliminary result)   Collection Time: 03/25/24  5:29 PM   Specimen: BLOOD  Result Value Ref Range Status   Specimen Description BLOOD LEFT ANTECUBITAL  Final   Special Requests   Final    BOTTLES DRAWN AEROBIC AND ANAEROBIC Blood Culture adequate volume   Culture   Final    NO GROWTH 3 DAYS Performed at [facility], [address] [place], [place] [address]    Report Status PENDING  Incomplete  Culture, blood (routine x 2)     Status: None (Preliminary result)   Collection Time: 03/25/24  5:41 PM   Specimen: BLOOD LEFT FOREARM  Result Value Ref Range Status   Specimen Description BLOOD LEFT FOREARM  Final   Special Requests   Final    BOTTLES DRAWN AEROBIC AND ANAEROBIC Blood Culture adequate volume    Culture   Final    NO GROWTH 3 DAYS Performed at [facility], [address], [place], [place] [address]    Report Status PENDING  Incomplete         Radiology Studies: No results found.       Scheduled Meds:  apixaban   2.5 mg Oral BID   ascorbic acid  500 mg Oral Daily   furosemide   40 mg Intravenous Daily   hydrOXYzine  10 mg Oral QHS   insulin  aspart  0-15 Units Subcutaneous TID WC   insulin  aspart  0-5 Units Subcutaneous QHS   loratadine  10 mg Oral QHS   metolazone  2.5 mg Oral Daily   metoprolol  succinate  12.5 mg Oral Daily   multivitamin  1 tablet Oral Daily   multivitamin with minerals  1 tablet Oral Daily   nicotine  7 mg Transdermal Daily   pantoprazole   40 mg Oral Daily   potassium chloride  SA  20 mEq Oral Daily   pravastatin   40 mg Oral QHS   tamsulosin  0.4 mg Oral QPC supper   Continuous Infusions:  ceFEPime (MAXIPIME) IV 2 g (03/28/24 0758)   vancomycin  1,500 mg (03/27/24 2120)     LOS: 3 days    Time spent: 55 minutes    Hang Ammon D Maree, DO [facility]  If 7PM-7AM, please contact night-coverage [email or web address] 03/28/2024, 10:25 AM ]

## 2024-03-28 NOTE — NC FL2 (Signed)
 [place]  MEDICAID [id number] LEVEL OF CARE FORM     IDENTIFICATION  Patient Name: William Walker Birthdate: 1926-10-15 Sex: male Admission Date (Current Location): 03/25/2024  [facility] and [place] Number:  [employer] and Address:  [facility],  [address] [place] [address]      Provider Number: [id number]  Attending Physician Name and Address:  Maree Adron BIRCH, DO  Relative Name and Phone Number:  Georgina Schlatter (Daughter)  [phone]    Current Level of Care: Hospital Recommended Level of Care: Skilled Nursing Facility Prior Approval Number:    Date Approved/Denied:   PASRR Number: [id number]  Discharge Plan: SNF    Current Diagnoses: Patient Active Problem List   Diagnosis Date Noted   Cellulitis of right lower extremity 03/26/2024   Type 2 diabetes mellitus with peripheral neuropathy (HCC) 03/26/2024   Hypothyroidism 03/26/2024   GERD without esophagitis 03/26/2024   Status post aortic valve replacement with bioprosthetic valve 03/26/2024   BPH (benign prostatic hyperplasia) 03/26/2024   Dyslipidemia 03/26/2024   Elevated troponin I level 03/26/2024   Chronic diastolic CHF (congestive heart failure) (HCC) 03/26/2024   Acute lower UTI 03/26/2024   Generalized weakness 03/25/2024   Sleep-wake disorder 03/24/2024   Right foot injury, initial encounter 03/23/2024   Benign prostatic hyperplasia with urinary frequency 03/23/2024   Controlled type 2 diabetes mellitus with hyperglycemia, without long-term current use of insulin  (HCC)    Debility 12/07/2019   S/P aortic valve replacement with bioprosthetic valve 11/22/2019   Bilateral lower extremity edema    Macrocytosis without anemia    Severe aortic stenosis 02/05/2017   Occlusion and stenosis of carotid artery without mention of cerebral infarction 06/17/2013   Neuropathy due to secondary diabetes mellitus (HCC) 06/17/2013   Peripheral vascular disease of foot 04/01/2012   Bilateral  carotid artery disease 04/01/2012   CAD, NATIVE VESSEL 01/25/2010   Hyperlipidemia with target LDL less than 70 12/23/2008   Essential hypertension 12/23/2008    Orientation RESPIRATION BLADDER Height & Weight     Self, Place  Normal Incontinent Weight: 199 lb 11.8 oz (90.6 kg) Height:  6' (182.9 cm)  BEHAVIORAL SYMPTOMS/MOOD NEUROLOGICAL BOWEL NUTRITION STATUS      Continent Diet (Carb modified)  AMBULATORY STATUS COMMUNICATION OF NEEDS Skin   Extensive Assist Verbally Normal                       Personal Care Assistance Level of Assistance  Bathing, Feeding, Dressing Bathing Assistance: Maximum assistance Feeding assistance: Maximum assistance Dressing Assistance: Maximum assistance     Functional Limitations Info  Sight, Hearing, Speech Sight Info: Impaired Hearing Info: Adequate Speech Info: Adequate    SPECIAL CARE FACTORS FREQUENCY  PT (By licensed PT), OT (By licensed OT)     PT Frequency: 5x/wk OT Frequency: 5x/wk            Contractures Contractures Info: Not present    Additional Factors Info  Code Status, Allergies Code Status Info: FULL Allergies Info: Januvia (Sitagliptin), Penicillins           Current Medications (03/28/2024):  This is the current hospital active medication list Current Facility-Administered Medications  Medication Dose Route Frequency Provider Last Rate Last Admin   acetaminophen  (TYLENOL ) tablet 650 mg  650 mg Oral Q6H PRN Mansy, Jan A, MD       Or   acetaminophen  (TYLENOL ) suppository 650 mg  650 mg Rectal Q6H PRN Mansy, Madison LABOR, MD  apixaban  (ELIQUIS ) tablet 2.5 mg  2.5 mg Oral BID Mansy, Jan A, MD   2.5 mg at 03/28/24 9196   artificial tears ophthalmic solution 1 drop  1 drop Both Eyes Daily PRN Mansy, Jan A, MD       ascorbic acid (VITAMIN C) tablet 500 mg  500 mg Oral Daily Mansy, Jan A, MD   500 mg at 03/28/24 0804   ceFEPIme (MAXIPIME) 2 g in sodium chloride  0.9 % 100 mL IVPB  2 g Intravenous Q24H Mansy,  Jan A, MD 200 mL/hr at 03/28/24 0758 2 g at 03/28/24 0758   furosemide  (LASIX ) injection 40 mg  40 mg Intravenous Daily Mansy, Jan A, MD   40 mg at 03/28/24 0805   hydrOXYzine (ATARAX) tablet 10 mg  10 mg Oral QHS Mansy, Jan A, MD   10 mg at 03/27/24 2115   insulin  aspart (novoLOG ) injection 0-15 Units  0-15 Units Subcutaneous TID WC Mansy, Madison LABOR, MD   3 Units at 03/28/24 1158   insulin  aspart (novoLOG ) injection 0-5 Units  0-5 Units Subcutaneous QHS Mansy, Jan A, MD       loratadine (CLARITIN) tablet 10 mg  10 mg Oral QHS Mansy, Jan A, MD   10 mg at 03/27/24 2115   metolazone (ZAROXOLYN) tablet 2.5 mg  2.5 mg Oral Daily Mansy, Jan A, MD   2.5 mg at 03/28/24 9196   metoprolol  succinate (TOPROL -XL) 24 hr tablet 12.5 mg  12.5 mg Oral Daily Mansy, Jan A, MD   12.5 mg at 03/28/24 0804   multivitamin (PROSIGHT) tablet 1 tablet  1 tablet Oral Daily Mansy, Jan A, MD   1 tablet at 03/28/24 0804   multivitamin with minerals tablet 1 tablet  1 tablet Oral Daily Mansy, Jan A, MD   1 tablet at 03/28/24 9196   nicotine (NICODERM CQ - dosed in mg/24 hr) patch 7 mg  7 mg Transdermal Daily Maree, Pratik D, DO   7 mg at 03/28/24 1158   ondansetron  (ZOFRAN ) tablet 4 mg  4 mg Oral Q6H PRN Mansy, Jan A, MD       Or   ondansetron  (ZOFRAN ) injection 4 mg  4 mg Intravenous Q6H PRN Mansy, Jan A, MD       pantoprazole  (PROTONIX ) EC tablet 40 mg  40 mg Oral Daily Mansy, Jan A, MD   40 mg at 03/28/24 0804   potassium chloride  SA (KLOR-CON  M) CR tablet 20 mEq  20 mEq Oral Daily Mansy, Jan A, MD   20 mEq at 03/28/24 0804   pravastatin  (PRAVACHOL ) tablet 40 mg  40 mg Oral QHS Mansy, Jan A, MD   40 mg at 03/27/24 2115   senna-docusate (Senokot-S) tablet 1 tablet  1 tablet Oral QHS PRN Mansy, Jan A, MD       tamsulosin (FLOMAX) capsule 0.4 mg  0.4 mg Oral QPC supper Mansy, Jan A, MD   0.4 mg at 03/27/24 1749   traZODone (DESYREL) tablet 25-50 mg  25-50 mg Oral QHS PRN Mansy, Jan A, MD   50 mg at 03/27/24 2115   vancomycin   (VANCOREADY) IVPB 1500 mg/300 mL  1,500 mg Intravenous Q48H Nazari, Walid A, RPH 150 mL/hr at 03/27/24 2120 1,500 mg at 03/27/24 2120     Discharge Medications: Please see discharge summary for a list of discharge medications.  Relevant Imaging Results:  Relevant Lab Results:   Additional Information SSN: [id number]  Hoy LABOR Bigness, LCSW

## 2024-03-29 DIAGNOSIS — R531 Weakness: Secondary | ICD-10-CM | POA: Diagnosis not present

## 2024-03-29 LAB — GLUCOSE, CAPILLARY
Glucose-Capillary: 132 mg/dL — ABNORMAL HIGH (ref 70–99)
Glucose-Capillary: 166 mg/dL — ABNORMAL HIGH (ref 70–99)
Glucose-Capillary: 138 mg/dL — ABNORMAL HIGH (ref 70–99)
Glucose-Capillary: 162 mg/dL — ABNORMAL HIGH (ref 70–99)

## 2024-03-29 LAB — CREATININE, SERUM
Creatinine, Ser: 2.11 mg/dL — ABNORMAL HIGH (ref 0.61–1.24)
GFR, Estimated: 28 mL/min — ABNORMAL LOW (ref >60)

## 2024-03-29 NOTE — Consult Note (Signed)
 Pharmacy Antibiotic Note  William Walker is a 89 y.o. male admitted on 03/25/2024 with weakness.  Pharmacy has been consulted for Vancomycin  and Cefepime dosing.  Plan:: Continue vancomycin  1500 IV Q 48 hrs.  Continue cefepime 2g Q 24 hours Possible discharge 10/28   Height: 6' (182.9 cm) Weight: 90.6 kg (199 lb 11.8 oz) IBW/kg (Calculated) : 77.6  Temp (24hrs), Avg:97.7 F (36.5 C), Min:97.6 F (36.4 C), Max:97.8 F (36.6 C)  Recent Labs  Lab 03/25/24 1729 03/26/24 0436 03/28/24 0457 03/29/24 0510  WBC 6.1 4.9 5.9  --   CREATININE 2.06* 1.91* 2.19* 2.11*  LATICACIDVEN 1.8  --   --   --     Estimated Creatinine Clearance: 23 mL/min (A) (by C-G formula based on SCr of 2.11 mg/dL (H)).    Allergies  Allergen Reactions   Januvia [Sitagliptin] Other (See Comments)    Unknown    Penicillins Other (See Comments)    Unknown reaction    Antimicrobials this admission: Vancomycin  10/23 >>  Cefepime 10/23 >>  Ceftriaxone  10/23 x1  Dose adjustments this admission: N/A  Microbiology results: 10/23 BCx: ngtd 10/23 UCx: ng   Thank you for allowing pharmacy to be a part of this patient's care.  William Walker 03/29/2024 2:16 PM

## 2024-03-29 NOTE — Plan of Care (Signed)
   Problem: Education: Goal: Knowledge of General Education information will improve Description: Including pain rating scale, medication(s)/side effects and non-pharmacologic comfort measures Outcome: Progressing   Problem: Clinical Measurements: Goal: Ability to maintain clinical measurements within normal limits will improve Outcome: Progressing Goal: Diagnostic test results will improve Outcome: Progressing

## 2024-03-29 NOTE — Plan of Care (Signed)
   Problem: Clinical Measurements: Goal: Diagnostic test results will improve Outcome: Progressing Goal: Respiratory complications will improve Outcome: Progressing

## 2024-03-29 NOTE — Plan of Care (Signed)
  Problem: Acute Rehab OT Goals (only OT should resolve) Goal: Pt. Will Perform Grooming Flowsheets (Taken 03/29/2024 1615) Pt Will Perform Grooming:  with set-up  sitting  with modified independence Goal: Pt. Will Perform Upper Body Bathing Flowsheets (Taken 03/29/2024 1615) Pt Will Perform Upper Body Bathing:  with modified independence  with set-up  sitting Goal: Pt. Will Perform Upper Body Dressing Flowsheets (Taken 03/29/2024 1615) Pt Will Perform Upper Body Dressing:  with modified independence  with set-up  sitting Goal: Pt. Will Transfer To Toilet Flowsheets (Taken 03/29/2024 1615) Pt Will Transfer to Toilet:  with min assist  stand pivot transfer Goal: Pt. Will Perform Toileting-Clothing Manipulation Flowsheets (Taken 03/29/2024 1615) Pt Will Perform Toileting - Clothing Manipulation and hygiene:  with min assist  sitting/lateral leans Goal: Pt/Caregiver Will Perform Home Exercise Program Flowsheets (Taken 03/29/2024 1615) Pt/caregiver will Perform Home Exercise Program:  Increased ROM  Increased strength  Both right and left upper extremity  Independently  Cari Burgo OT, MOT

## 2024-03-29 NOTE — Progress Notes (Signed)
 PROGRESS NOTE    William Walker  FMW:[id number] DOB: 16-Mar-1927 DOA: 03/25/2024 PCP: Mancil Pfeiffer, PA-C   Brief Narrative:    89 y.o. Caucasian male with medical history significant for essential hypertension, coronary artery disease, urolithiasis, dyslipidemia, type diabetes mellitus with diabetic neuropathy who presented to the emergency room with acute onset of generalized weakness and worsening right foot and leg swelling with erythema with tenderness since Tuesday.  He was given outpatient antibiotic therapy with p.o. Keflex  which he has been taking without significant improvement.  Being treated with IV antibiotics. PT/OT recommending SNF and placement pending.  Cellulitis is improving.  Assessment & Plan:   Principal Problem:   Generalized weakness Active Problems:   Cellulitis of right lower extremity   Elevated troponin I level   Acute lower UTI   Type 2 diabetes mellitus with peripheral neuropathy (HCC)   Hypothyroidism   GERD without esophagitis   Status post aortic valve replacement with bioprosthetic valve   BPH (benign prostatic hyperplasia)   Dyslipidemia   Chronic diastolic CHF (congestive heart failure) (HCC)  Assessment and Plan:   Generalized weakness/Physical deconditioning, falls at home,POA; - PT/OT recommending SNF placement   Cellulitis of right lower extremity,POA: failed outpatient oral antibiotics - The patient will be placed on IV vancomycin  and cefepime. - Warm compresses will be utilized. - Pain management will be provided.   Acute lower UTI - No growth noted on cultures - This could be contributing to his generalized weakness. - This should be covered by IV cefepime.   Elevated troponin I level - This is likely secondary to demand ischemia and has been coming down.   Chronic diastolic CHF,POA: NYHA III - The patient has no symptoms of clinical exacerbation. - Will continue Toprol -XL. -continue with lasix  20 mg daily    Dyslipidemia - continue statin therapy.   BPH  - Will continue Flomax.   Status post aortic valve replacement with bioprosthetic valve - Will continue Eliquis .   GERD without esophagitis - Will continue PPI therapy.   Hypothyroidism - Will continue Synthroid .   Type 2 diabetes mellitus with peripheral neuropathy  The patient will be placed on supplement coverage with NovoLog . - Will continue Glucotrol .   Disposition: Lives at home with his wife and has HHPT. May need SNF placement.    DVT prophylaxis:apixaban  Code Status: Full Family Communication: Daughter at bedside 10/27 Disposition Plan:  Status is: Inpatient Remains inpatient appropriate because: Need for IV medications.   Consultants:  None  Procedures:  None  Antimicrobials:  Anti-infectives (From admission, onward)    Start     Dose/Rate Route Frequency Ordered Stop   03/27/24 2200  vancomycin  (VANCOREADY) IVPB 1500 mg/300 mL        1,500 mg 150 mL/hr over 120 Minutes Intravenous Every 48 hours 03/25/24 2110     03/26/24 0800  ceFEPIme (MAXIPIME) 2 g in sodium chloride  0.9 % 100 mL IVPB        2 g 200 mL/hr over 30 Minutes Intravenous Every 24 hours 03/25/24 2045     03/25/24 2145  vancomycin  (VANCOCIN ) IVPB 1000 mg/200 mL premix  Status:  Discontinued        1,000 mg 200 mL/hr over 60 Minutes Intravenous  Once 03/25/24 2045 03/25/24 2052   03/25/24 2145  vancomycin  (VANCOCIN ) IVPB 1000 mg/200 mL premix       Placed in And Linked Group   1,000 mg 200 mL/hr over 60 Minutes Intravenous  Once 03/25/24 2052 03/26/24 0747  03/25/24 2145  vancomycin  (VANCOCIN ) IVPB 1000 mg/200 mL premix       Placed in And Linked Group   1,000 mg 200 mL/hr over 60 Minutes Intravenous  Once 03/25/24 2052 03/26/24 0747   03/25/24 1930  cefTRIAXone  (ROCEPHIN ) 1 g in sodium chloride  0.9 % 100 mL IVPB        1 g 200 mL/hr over 30 Minutes Intravenous  Once 03/25/24 1922 03/26/24 0747       Subjective: Patient seen  and evaluated today with agitation that has improved after initiation of nicotine patch.  No other acute overnight events noted and authorization pending for SNF placement.  Objective: Vitals:   03/28/24 1334 03/28/24 1943 03/29/24 0357 03/29/24 0801  BP: (!) 123/56 (!) 118/49 (!) 113/50   Pulse: (!) 58 61 (!) 59 61  Resp: 17 18 16    Temp: 97.8 F (36.6 C) 97.7 F (36.5 C) 97.6 F (36.4 C)   TempSrc: Oral Oral Oral   SpO2: 93% 97% 96%   Weight:      Height:        Intake/Output Summary (Last 24 hours) at 03/29/2024 1251 Last data filed at 03/29/2024 0500 Gross per 24 hour  Intake 1220 ml  Output 2050 ml  Net -830 ml   Filed Weights   03/25/24 1646 03/25/24 2034  Weight: 92 kg 90.6 kg    Examination:  General exam: Appears calm and comfortable  Respiratory system: Clear to auscultation. Respiratory effort normal. Cardiovascular system: S1 & S2 heard, RRR.  Gastrointestinal system: Abdomen is soft Central nervous system: Alert and awake Extremities: Bilateral lower extremity erythema with swelling to right lower extremity noted, much improved Skin: No significant lesions noted Psychiatry: Flat affect.    Data Reviewed: I have personally reviewed following labs and imaging studies  CBC: Recent Labs  Lab 03/25/24 1729 03/26/24 0436 03/28/24 0457  WBC 6.1 4.9 5.9  HGB 10.7* 10.0* 10.1*  HCT 32.0* 29.1* 30.5*  MCV 103.9* 101.4* 102.3*  PLT 131* 117* 112*   Basic Metabolic Panel: Recent Labs  Lab 03/25/24 1729 03/26/24 0436 03/28/24 0457 03/29/24 0510  NA 136 136 135  --   K 3.9 3.2* 3.5  --   CL 91* 93* 92*  --   CO2 30 28 26   --   GLUCOSE 181* 158* 145*  --   BUN 106* 100* 106*  --   CREATININE 2.06* 1.91* 2.19* 2.11*  CALCIUM  9.4 8.9 9.1  --   MG  --   --  2.4  --    GFR: Estimated Creatinine Clearance: 23 mL/min (A) (by C-G formula based on SCr of 2.11 mg/dL (H)). Liver Function Tests: Recent Labs  Lab 03/25/24 1729  AST 40  ALT 33   ALKPHOS 164*  BILITOT 1.1  PROT 7.0  ALBUMIN  4.2   No results for input(s): LIPASE, AMYLASE in the last 168 hours. No results for input(s): AMMONIA in the last 168 hours. Coagulation Profile: No results for input(s): INR, PROTIME in the last 168 hours. Cardiac Enzymes: No results for input(s): CKTOTAL, CKMB, CKMBINDEX, TROPONINI in the last 168 hours. BNP (last 3 results) No results for input(s): PROBNP in the last 8760 hours. HbA1C: No results for input(s): HGBA1C in the last 72 hours. CBG: Recent Labs  Lab 03/28/24 1107 03/28/24 1612 03/28/24 2037 03/29/24 0725 03/29/24 1105  GLUCAP 190* 170* 162* 132* 166*   Lipid Profile: No results for input(s): CHOL, HDL, LDLCALC, TRIG, CHOLHDL, LDLDIRECT in the last  72 hours. Thyroid  Function Tests: No results for input(s): TSH, T4TOTAL, FREET4, T3FREE, THYROIDAB in the last 72 hours. Anemia Panel: No results for input(s): VITAMINB12, FOLATE, FERRITIN, TIBC, IRON, RETICCTPCT in the last 72 hours. Sepsis Labs: Recent Labs  Lab 03/25/24 1729  LATICACIDVEN 1.8    Recent Results (from the past 240 hours)  Resp panel by RT-PCR (RSV, Flu A&B, Covid) Urine, Clean Catch     Status: None   Collection Time: 03/25/24  5:28 PM   Specimen: Urine, Clean Catch; Nasal Swab  Result Value Ref Range Status   SARS Coronavirus 2 by RT PCR NEGATIVE NEGATIVE Final    Comment: (NOTE) SARS-CoV-2 target nucleic acids are NOT DETECTED.  The SARS-CoV-2 RNA is generally detectable in upper respiratory specimens during the acute phase of infection. The lowest concentration of SARS-CoV-2 viral copies this assay can detect is 138 copies/mL. A negative result does not preclude SARS-Cov-2 infection and should not be used as the sole basis for treatment or other patient management decisions. A negative result may occur with  improper specimen collection/handling, submission of specimen other than  nasopharyngeal swab, presence of viral mutation(s) within the areas targeted by this assay, and inadequate number of viral copies(<138 copies/mL). A negative result must be combined with clinical observations, patient history, and epidemiological information. The expected result is Negative.  Fact Sheet for Patients:  bloggercourse.com  Fact Sheet for Healthcare Providers:  seriousbroker.it  This test is no t yet approved or cleared by the [place]  FDA and  has been authorized for detection and/or diagnosis of SARS-CoV-2 by FDA under an Emergency Use Authorization (EUA). This EUA will remain  in effect (meaning this test can be used) for the duration of the COVID-19 declaration under Section 564(b)(1) of the Act, 21 U.S.C.section 360bbb-3(b)(1), unless the authorization is terminated  or revoked sooner.       Influenza A by PCR NEGATIVE NEGATIVE Final   Influenza B by PCR NEGATIVE NEGATIVE Final    Comment: (NOTE) The Xpert Xpress SARS-CoV-2/FLU/RSV plus assay is intended as an aid in the diagnosis of influenza from Nasopharyngeal swab specimens and should not be used as a sole basis for treatment. Nasal washings and aspirates are unacceptable for Xpert Xpress SARS-CoV-2/FLU/RSV testing.  Fact Sheet for Patients: bloggercourse.com  Fact Sheet for Healthcare Providers: seriousbroker.it  This test is not yet approved or cleared by the [place]  FDA and has been authorized for detection and/or diagnosis of SARS-CoV-2 by FDA under an Emergency Use Authorization (EUA). This EUA will remain in effect (meaning this test can be used) for the duration of the COVID-19 declaration under Section 564(b)(1) of the Act, 21 U.S.C. section 360bbb-3(b)(1), unless the authorization is terminated or revoked.     Resp Syncytial Virus by PCR NEGATIVE NEGATIVE Final    Comment:  (NOTE) Fact Sheet for Patients: bloggercourse.com  Fact Sheet for Healthcare Providers: seriousbroker.it  This test is not yet approved or cleared by the [place]  FDA and has been authorized for detection and/or diagnosis of SARS-CoV-2 by FDA under an Emergency Use Authorization (EUA). This EUA will remain in effect (meaning this test can be used) for the duration of the COVID-19 declaration under Section 564(b)(1) of the Act, 21 U.S.C. section 360bbb-3(b)(1), unless the authorization is terminated or revoked.  Performed at [facility], [address] [place], [place] [address]   Urine Culture     Status: None   Collection Time: 03/25/24  5:28 PM   Specimen: Urine, Random  Result Value Ref Range Status   Specimen Description   Final    URINE, RANDOM Performed at [facility], [address], [place], [place] [address]    Special Requests   Final    NONE Reflexed from [id number] Performed at [facility], [address] [place], [place] [address]    Culture   Final    NO GROWTH Performed at [facility], [address], [place], [place] [address]    Report Status 03/26/2024 FINAL  Final  Culture, blood (routine x 2)     Status: None (Preliminary result)   Collection Time: 03/25/24  5:29 PM   Specimen: BLOOD  Result Value Ref Range Status   Specimen Description BLOOD LEFT ANTECUBITAL  Final   Special Requests   Final    BOTTLES DRAWN AEROBIC AND ANAEROBIC Blood Culture adequate volume   Culture   Final    NO GROWTH 4 DAYS Performed at [facility], [address], [place], [place] [address]    Report Status PENDING  Incomplete  Culture, blood (routine x 2)     Status: None (Preliminary result)   Collection Time: 03/25/24  5:41 PM   Specimen: BLOOD LEFT FOREARM  Result Value Ref Range Status   Specimen Description BLOOD LEFT FOREARM  Final   Special Requests   Final    BOTTLES DRAWN AEROBIC AND ANAEROBIC Blood  Culture adequate volume   Culture   Final    NO GROWTH 4 DAYS Performed at [facility], [address] [place], [place] [address]    Report Status PENDING  Incomplete         Radiology Studies: No results found.       Scheduled Meds:  apixaban   2.5 mg Oral BID   ascorbic acid  500 mg Oral Daily   furosemide   40 mg Intravenous Daily   hydrOXYzine  10 mg Oral QHS   insulin  aspart  0-15 Units Subcutaneous TID WC   insulin  aspart  0-5 Units Subcutaneous QHS   loratadine  10 mg Oral QHS   metolazone  2.5 mg Oral Daily   metoprolol  succinate  12.5 mg Oral Daily   multivitamin  1 tablet Oral Daily   multivitamin with minerals  1 tablet Oral Daily   nicotine  7 mg Transdermal Daily   pantoprazole   40 mg Oral Daily   potassium chloride  SA  20 mEq Oral Daily   pravastatin   40 mg Oral QHS   tamsulosin  0.4 mg Oral QPC supper   Continuous Infusions:  ceFEPime (MAXIPIME) IV 2 g (03/29/24 0750)   vancomycin  1,500 mg (03/27/24 2120)     LOS: 4 days    Time spent: 55 minutes    Marylon Verno D Maree, DO [facility]  If 7PM-7AM, please contact night-coverage [email or web address] 03/29/2024, 12:51 PM ]

## 2024-03-29 NOTE — TOC Progression Note (Signed)
 Transition of Care [facility]) - Progression Note    Patient Details  Name: William Walker MRN: [id number] Date of Birth: 02-21-1927  [facility] of [facility] CM/SW Contact  Hoy DELENA Bigness, LCSW Phone Number: 03/29/2024, 9:19 AM  Clinical Narrative:    Reviewed bed offer with pt's daughter, Suzen, who accepted SNF at [facility]. Insurance auth being requested.   [facility] [address] [place], [place] [address] [phone] Overall rating ?     Barriers to Discharge: Continued Medical Work up               Expected Discharge Plan and Services In-house Referral: Clinical Social Work Discharge Planning Services: CM Consult Post Acute Care Choice: Horticulturist, Commercial, Home Health Living arrangements for the past 2 months: Single Family Home                                       Social Drivers of Health (SDOH) Interventions SDOH Screenings   Food Insecurity: No Food Insecurity (03/25/2024)  Housing: Low Risk  (03/25/2024)  Transportation Needs: No Transportation Needs (03/25/2024)  Utilities: Not At Risk (03/25/2024)  Depression (PHQ2-9): High Risk (03/23/2024)  Financial Resource Strain: Low Risk  (03/23/2024)  Physical Activity: Inactive (03/23/2024)  Social Connections: Moderately Integrated (03/25/2024)  Recent Concern: Social Connections - Moderately Isolated (03/23/2024)  Stress: Stress Concern Present (03/23/2024)  Tobacco Use: High Risk (03/25/2024)    Readmission Risk Interventions    03/26/2024    3:45 PM  Readmission Risk Prevention Plan  Transportation Screening Complete  Home Care Screening Complete  Medication Review (RN CM) Complete

## 2024-03-29 NOTE — Evaluation (Addendum)
 Occupational Therapy Evaluation Patient Details Name: William Walker MRN: [id number] DOB: Oct 24, 1926 Today's Date: 03/29/2024   History of Present Illness   William Walker is a 89 y.o. Caucasian male with medical history significant for essential hypertension, coronary artery disease, urolithiasis, dyslipidemia, type diabetes mellitus with diabetic neuropathy who presented to the emergency room with acute onset of generalized weakness and worsening right foot and leg swelling with erythema with tenderness since Tuesday.  She was given outpatient antibiotic therapy with p.o. Keflex  which she has been taking without significant improvement.  She admits to urinary frequency and urgency without dysuria or hematuria or flank pain.  No cough or wheezing or dyspnea.  She has no chest pain or palpitations.  The patient has been having recurrent falls.     Clinical Impressions Pt agreeable to OT evaluation. Pt required mod A for bed mobility today with labored movement. Max A for sit to stand with RW followed bay max to total for EOB to chair without AD due to B LE buckling during standing. Pt limited in shoulder flexion and generally weak otherwise. Max to total assist for lower body ADL's. RN assessed pt during the session due to pt feeling dizzy with slightly altered cognition after standing. Pt's condition improved after sitting for a few minutes. Pt left in chair initially but later helped back to bed with same level of assist. Pt will benefit from continued OT in the hospital to increase strength, balance, and endurance for safe ADL's.        If plan is discharge home, recommend the following:   A lot of help with walking and/or transfers;A lot of help with bathing/dressing/bathroom;Assistance with cooking/housework;Assist for transportation;Help with stairs or ramp for entrance     Functional Status Assessment   Patient has had a recent decline in their functional status and demonstrates the  ability to make significant improvements in function in a reasonable and predictable amount of time.     Equipment Recommendations   None recommended by OT             Precautions/Restrictions   Precautions Precautions: Fall Recall of Precautions/Restrictions: Impaired Restrictions Weight Bearing Restrictions Per Provider Order: No     Mobility Bed Mobility Overal bed mobility: Needs Assistance Bed Mobility: Supine to Sit     Supine to sit: Mod assist, HOB elevated     General bed mobility comments: labored movement; hand helf assist    Transfers Overall transfer level: Needs assistance Equipment used: Rolling walker (2 wheels) Transfers: Sit to/from Stand, Bed to chair/wheelchair/BSC Sit to Stand: Max assist Stand pivot transfers: Max assist, Total assist         General transfer comment: Sit to stand with RW and max A; B LE buckling during stand with need to block them after some buckling; Max to total assist without RW for SPT to chair.      Balance Overall balance assessment: Needs assistance Sitting-balance support: Feet supported, Bilateral upper extremity supported Sitting balance-Leahy Scale: Fair Sitting balance - Comments: poor to fair seated at EOB; reduced balance in seated position after initial sit to stand with pt reported some light headedness/dizziness.   Standing balance support: During functional activity, Reliant on assistive device for balance, Bilateral upper extremity supported Standing balance-Leahy Scale: Poor Standing balance comment: w/ RW                           ADL either performed or  assessed with clinical judgement   ADL Overall ADL's : Needs assistance/impaired     Grooming: Set up;Contact guard assist;Sitting   Upper Body Bathing: Set up;Contact guard assist;Sitting   Lower Body Bathing: Maximal assistance;Total assistance;Sitting/lateral leans   Upper Body Dressing : Set up;Contact guard  assist;Sitting   Lower Body Dressing: Maximal assistance;Total assistance;Sitting/lateral leans   Toilet Transfer: Maximal assistance;Total assistance;Stand-pivot;Rolling walker (2 wheels) Toilet Transfer Details (indicate cue type and reason): Simulated via sit to stand with RW and EOB to chair without RW. Toileting- Clothing Manipulation and Hygiene: Maximal assistance;Total assistance;Bed level               Vision Baseline Vision/History: 1 Wears glasses Ability to See in Adequate Light: 1 Impaired Patient Visual Report: No change from baseline Vision Assessment?: No apparent visual deficits     Perception Perception: Not tested       Praxis Praxis: Not tested       Pertinent Vitals/Pain Pain Assessment Pain Assessment: Faces Faces Pain Scale: No hurt     Extremity/Trunk Assessment Upper Extremity Assessment Upper Extremity Assessment: RUE deficits/detail;LUE deficits/detail RUE Deficits / Details: 3-/5 shoulder flexion with limited P/ROM of shoulder flexion to ~75% of normal range with hard end feel. Generally weak otherwise with 4+/5 elbow flexion/extension, and gross grasp. LUE Deficits / Details: 3-/5 A/ROM for shoulder flexion with near full P/ROM. Generally weak otherwise.   Lower Extremity Assessment Lower Extremity Assessment: Defer to PT evaluation   Cervical / Trunk Assessment Cervical / Trunk Assessment: Kyphotic   Communication Communication Communication: Impaired Factors Affecting Communication: Other (comment) (Mild difficulty to express self briefly after standing, this improved after a few minutes of sitting.)   Cognition Arousal: Alert Behavior During Therapy: WFL for tasks assessed/performed Cognition: Cognition impaired   Orientation impairments: Time         OT - Cognition Comments: Pt mildly confused on year but did get it correct after a few attempts. Pt became slightly confused and slow to respond after initial stand, but this  improved after some time sitting.                 Following commands: Intact       Cueing  General Comments   Cueing Techniques: Verbal cues;Tactile cues;Gestural cues                 Home Living Family/patient expects to be discharged to:: Private residence Living Arrangements: Spouse/significant other Available Help at Discharge: Family;Available PRN/intermittently Type of Home: House Home Access: Ramped entrance;Stairs to enter Entrance Stairs-Number of Steps: 3 steps in back Entrance Stairs-Rails: Right Home Layout: Two level;Able to live on main level with bedroom/bathroom     Bathroom Shower/Tub: Chief Strategy Officer: Standard     Home Equipment: Shower seat;Grab bars - tub/shower;BSC/3in1;Rolling Environmental Consultant (2 wheels)   Additional Comments: per chart      Prior Functioning/Environment Prior Level of Function : Needs assist;History of Falls (last six months)       Physical Assist : ADLs (physical);Mobility (physical) Mobility (physical): Transfers;Gait;Bed mobility ADLs (physical): Bathing;Dressing;IADLs Mobility Comments: Household ambulator with RW prior to foot pain. ADLs Comments: Assit bathing, dressing, IADL's. Able to complete toileting and feed self.    OT Problem List: Decreased strength;Decreased range of motion;Decreased activity tolerance;Impaired balance (sitting and/or standing);Decreased cognition   OT Treatment/Interventions: Self-care/ADL training;Therapeutic exercise;DME and/or AE instruction;Therapeutic activities;Patient/family education;Balance training      OT Goals(Current goals can be found in the care plan section)  Acute Rehab OT Goals Patient Stated Goal: improve function OT Goal Formulation: With patient/family Time For Goal Achievement: 04/12/24 Potential to Achieve Goals: Fair   OT Frequency:  Min 2X/week                                   End of Session Equipment Utilized During  Treatment: Rolling walker (2 wheels);Gait belt Nurse Communication: Mobility status;Other (comment) (RN also assessed pt after standing and becoming mildly confused and dizzy.)  Activity Tolerance: Patient tolerated treatment well Patient left: in chair;with call bell/phone within reach;with chair alarm set;with family/visitor present  OT Visit Diagnosis: Unsteadiness on feet (R26.81);Other abnormalities of gait and mobility (R26.89);Muscle weakness (generalized) (M62.81);History of falling (Z91.81)                Time: 8569-8541 OT Time Calculation (min): 28 min Charges:  OT General Charges $OT Visit: 1 Visit OT Evaluation $OT Eval Low Complexity: 1 Low  Demica Zook OT, MOT  Jayson Person 03/29/2024, 4:12 PM

## 2024-03-30 DIAGNOSIS — R531 Weakness: Secondary | ICD-10-CM | POA: Diagnosis not present

## 2024-03-30 LAB — GLUCOSE, CAPILLARY
Glucose-Capillary: 125 mg/dL — ABNORMAL HIGH (ref 70–99)
Glucose-Capillary: 167 mg/dL — ABNORMAL HIGH (ref 70–99)

## 2024-03-30 LAB — CULTURE, BLOOD (ROUTINE X 2)
Culture: NO GROWTH
Culture: NO GROWTH
Special Requests: ADEQUATE
Special Requests: ADEQUATE

## 2024-03-30 MED ORDER — DOXYCYCLINE HYCLATE 100 MG PO TABS: 100.0000 mg | ORAL_TABLET | Freq: Two times a day (BID) | ORAL | 0 refills | Status: AC

## 2024-03-30 MED ORDER — NICOTINE 7 MG/24HR TD PT24: 7.0000 mg | MEDICATED_PATCH | Freq: Every day | TRANSDERMAL | 0 refills | Status: AC

## 2024-03-30 MED ORDER — CHLORHEXIDINE GLUCONATE CLOTH 2 % EX PADS
6.0000 | MEDICATED_PAD | Freq: Every day | CUTANEOUS | Status: DC
  Administered 2024-03-30: 6 via TOPICAL

## 2024-03-30 NOTE — Care Management Important Message (Signed)
 Important Message  Patient Details  Name: William Walker MRN: [id number] Date of Birth: [birth year]   Important Message Given:  Yes - Medicare IM     Jakaiden Fill L Aristidis Talerico 03/30/2024, 2:05 PM

## 2024-03-30 NOTE — Discharge Summary (Signed)
 Physician Discharge Summary  William Walker FMW:[id number] DOB: 1927-01-21 DOA: 03/25/2024  PCP: Mancil Pfeiffer, PA-C  Admit date: 03/25/2024  Discharge date: 03/30/2024  Admitted From:Home  Disposition: SNF  Recommendations for Outpatient Follow-up:  Follow up with PCP in 1-2 weeks Remain on doxycycline  as prescribed for 5 more days to complete total 10-day course of treatment for cellulitis Nicotine patch prescribed Continue other home medications as prior Foley catheter to be placed prior to discharge due to significant urinary incontinence issues after discussion with daughter as this has been quite persistent.  Plan to follow-up with his urologist Dr. Sherrilee outpatient for void trial and further considerations.  Home Health: None  Equipment/Devices: Foley Catheter  Discharge Condition:Stable  CODE STATUS: Full  Diet recommendation: Heart Healthy/carb modified  Brief/Interim Summary: 89 y.o. Caucasian male with medical history significant for essential hypertension, coronary artery disease, urolithiasis, dyslipidemia, type diabetes mellitus with diabetic neuropathy who presented to the emergency room with acute onset of generalized weakness and worsening right foot and leg swelling with erythema with tenderness since Tuesday.  He was given outpatient antibiotic therapy with p.o. Keflex  which he has been taking without significant improvement.  He was treated with IV antibiotics with gradual improvement in his cellulitis.  He is now able to transition to oral dosing and finish the rest of the course of treatment.  He was seen by PT/OT with recommendations for SNF placement and this has been arranged at this point.  Finally, further discussion was had with daughter on the day of discharge regarding urinary incontinence issues that have been persistent for quite some time in the setting of BPH and patient will be discharged with Foley catheter and outpatient urology  follow-up.  Discharge Diagnoses:  Principal Problem:   Generalized weakness Active Problems:   Cellulitis of right lower extremity   Elevated troponin I level   Acute lower UTI   Type 2 diabetes mellitus with peripheral neuropathy (HCC)   Hypothyroidism   GERD without esophagitis   Status post aortic valve replacement with bioprosthetic valve   BPH (benign prostatic hyperplasia)   Dyslipidemia   Chronic diastolic CHF (congestive heart failure) (HCC)  Principal discharge diagnosis: Generalized weakness with physical deconditioning with bilateral lower extremity cellulitis.  Discharge Instructions  Discharge Instructions     Ambulatory referral to Urology   Complete by: As directed    Diet - low sodium heart healthy   Complete by: As directed    Increase activity slowly   Complete by: As directed       Allergies as of 03/30/2024       Reactions   Januvia [sitagliptin] Other (See Comments)   Unknown    Penicillins Other (See Comments)   Unknown reaction        Medication List     STOP taking these medications    cephALEXin  500 MG capsule Commonly known as: KEFLEX    clindamycin 300 MG capsule Commonly known as: CLEOCIN   ibuprofen 200 MG tablet Commonly known as: ADVIL       TAKE these medications    acetaminophen  650 MG CR tablet Commonly known as: TYLENOL  Take 650 mg by mouth every 8 (eight) hours as needed for pain.   ascorbic acid 500 MG tablet Commonly known as: VITAMIN C Take 500 mg by mouth daily.   doxycycline  100 MG tablet Commonly known as: VIBRA -TABS Take 1 tablet (100 mg total) by mouth 2 (two) times daily for 5 days.   Eliquis  2.5 MG Tabs  tablet Generic drug: apixaban  TAKE 1 TABLET BY MOUTH TWICE  DAILY   Gemtesa  75 MG Tabs Generic drug: Vibegron  Take 1 tablet by mouth at bedtime.   glipiZIDE  5 MG tablet Commonly known as: GLUCOTROL  Take 5 mg by mouth 2 (two) times daily before a meal.   hydrOXYzine 10 MG tablet Commonly  known as: ATARAX Take 10 mg by mouth at bedtime.   levothyroxine  125 MCG tablet Commonly known as: SYNTHROID  Take 125 mcg by mouth daily.   loratadine 10 MG tablet Commonly known as: CLARITIN Take 10 mg by mouth at bedtime.   metolazone 2.5 MG tablet Commonly known as: ZAROXOLYN Take 2.5 mg by mouth daily.   metoprolol  succinate 25 MG 24 hr tablet Commonly known as: TOPROL -XL TAKE ONE-HALF TABLET BY MOUTH  DAILY   multivitamin with minerals Tabs tablet Take 1 tablet by mouth daily.   nicotine 7 mg/24hr patch Commonly known as: NICODERM CQ - dosed in mg/24 hr Place 1 patch (7 mg total) onto the skin daily. Start taking on: March 31, 2024   pantoprazole  40 MG tablet Commonly known as: PROTONIX  Take 1 tablet (40 mg total) by mouth daily.   potassium chloride  SA 20 MEQ tablet Commonly known as: KLOR-CON  M Take 20 mEq by mouth daily.   pravastatin  40 MG tablet Commonly known as: PRAVACHOL  TAKE 1 TABLET BY MOUTH IN THE  EVENING   PreserVision AREDS Tabs Take 1 tablet by mouth in the morning and at bedtime.   Systane 0.4-0.3 % Soln Generic drug: Polyethyl Glycol-Propyl Glycol Place 1 drop into both eyes daily as needed (dry eyes).   tamsulosin 0.4 MG Caps capsule Commonly known as: FLOMAX Take 1 capsule (0.4 mg total) by mouth daily. What changed: when to take this   torsemide  20 MG tablet Commonly known as: DEMADEX  TAKE 2 TABLETS BY MOUTH DAILY  ALTERNATING WITH 1 TABLET BY  MOUTH DAILY What changed: See the new instructions.   traZODone 50 MG tablet Commonly known as: DESYREL Take 0.5-1 tablets (25-50 mg total) by mouth at bedtime as needed for sleep.        Contact information for after-discharge care     Destination     [facility] .   Service: Skilled Nursing Contact information: [address] [place]  [address] [phone]                    Allergies  Allergen Reactions    Januvia [Sitagliptin] Other (See Comments)    Unknown    Penicillins Other (See Comments)    Unknown reaction    Consultations: None   Procedures/Studies: CT Head Wo Contrast Result Date: 03/25/2024 CLINICAL DATA:  Altered mental status. Weakness. Fall 5 days ago. Patient on Eliquis . EXAM: CT HEAD WITHOUT CONTRAST TECHNIQUE: Contiguous axial images were obtained from the base of the skull through the vertex without intravenous contrast. RADIATION DOSE REDUCTION: This exam was performed according to the departmental dose-optimization program which includes automated exposure control, adjustment of the mA and/or kV according to patient size and/or use of iterative reconstruction technique. COMPARISON:  01/10/2024 FINDINGS: Brain: Ventricles, cisterns and other CSF spaces are within normal. There is mild age related atrophic change present. There is mild chronic ischemic microvascular disease. Small old lacunar infarct over the head of left caudate nucleus. Vascular: No hyperdense vessel or unexpected calcification. Skull: Normal. Negative for fracture or focal lesion. Sinuses/Orbits: No acute finding. Other: None. IMPRESSION: 1. No acute  findings. 2. Mild age related atrophic change and chronic ischemic microvascular disease. Small old lacunar infarct over the head of the left caudate nucleus. Electronically Signed   By: Toribio Agreste M.D.   On: 03/25/2024 18:16   DG Chest 1 View Result Date: 03/25/2024 CLINICAL DATA:  Weakness. EXAM: CHEST  1 VIEW COMPARISON:  02/15/2024 FINDINGS: Patient slightly rotated to the left. Sternotomy wires unchanged. Prosthetic aortic valve unchanged. Lungs are adequately inflated with hazy bilateral perihilar and bibasilar opacification which may be due to interstitial edema with bilateral effusions/bibasilar atelectasis. Infection in the lung bases is possible. Mild stable cardiomegaly. Remainder of the exam is unchanged. IMPRESSION: 1. Hazy bilateral perihilar and  bibasilar opacification likely due to interstitial edema with bilateral effusions/bibasilar atelectasis. Infection in the lung bases is possible. 2. Stable cardiomegaly. Electronically Signed   By: Toribio Agreste M.D.   On: 03/25/2024 17:41   DG Foot Complete Right Result Date: 03/23/2024 EXAM: 3 OR MORE VIEW(S) XRAY OF THE RIGHT FOOT 03/23/2024 03:21:00 PM COMPARISON: None available. CLINICAL HISTORY: FINDINGS: BONES AND JOINTS: No acute fracture. Mild hallux valgus deformity with degenerative changes at the first MTP joint. Second through fifth hammertoe deformities. No joint dislocation. SOFT TISSUES: Vascular calcifications. Marked dorsal soft tissue swelling. IMPRESSION: 1. Marked dorsal soft tissue swelling. 2. No acute fracture or dislocation. 3. Chronic changes as above. Electronically signed by: Waddell Calk MD 03/23/2024 03:52 PM EDT RP Workstation: [id number]     Discharge Exam: Vitals:   03/30/24 0325 03/30/24 0900  BP: (!) 117/54 (!) 114/59  Pulse: 60 (!) 59  Resp: 18 14  Temp: 98.3 F (36.8 C) 97.7 F (36.5 C)  SpO2: 98%    Vitals:   03/29/24 1330 03/29/24 1939 03/30/24 0325 03/30/24 0900  BP: (!) 105/56 (!) 117/58 (!) 117/54 (!) 114/59  Pulse: 62 (!) 59 60 (!) 59  Resp: 16 18 18 14   Temp: 97.8 F (36.6 C) (!) 97.5 F (36.4 C) 98.3 F (36.8 C) 97.7 F (36.5 C)  TempSrc: Oral Oral (P) Oral Axillary  SpO2: 94% 98% 98%   Weight:      Height:        General: Pt is alert, awake, not in acute distress Cardiovascular: RRR, S1/S2 +, no rubs, no gallops Respiratory: CTA bilaterally, no wheezing, no rhonchi Abdominal: Soft, NT, ND, bowel sounds + Extremities: no edema, no cyanosis    The results of significant diagnostics from this hospitalization (including imaging, microbiology, ancillary and laboratory) are listed below for reference.     Microbiology: Recent Results (from the past 240 hours)  Resp panel by RT-PCR (RSV, Flu A&B, Covid) Urine, Clean Catch      Status: None   Collection Time: 03/25/24  5:28 PM   Specimen: Urine, Clean Catch; Nasal Swab  Result Value Ref Range Status   SARS Coronavirus 2 by RT PCR NEGATIVE NEGATIVE Final    Comment: (NOTE) SARS-CoV-2 target nucleic acids are NOT DETECTED.  The SARS-CoV-2 RNA is generally detectable in upper respiratory specimens during the acute phase of infection. The lowest concentration of SARS-CoV-2 viral copies this assay can detect is 138 copies/mL. A negative result does not preclude SARS-Cov-2 infection and should not be used as the sole basis for treatment or other patient management decisions. A negative result may occur with  improper specimen collection/handling, submission of specimen other than nasopharyngeal swab, presence of viral mutation(s) within the areas targeted by this assay, and inadequate number of viral copies(<138 copies/mL). A negative result  must be combined with clinical observations, patient history, and epidemiological information. The expected result is Negative.  Fact Sheet for Patients:  bloggercourse.com  Fact Sheet for Healthcare Providers:  seriousbroker.it  This test is no t yet approved or cleared by the [place]  FDA and  has been authorized for detection and/or diagnosis of SARS-CoV-2 by FDA under an Emergency Use Authorization (EUA). This EUA will remain  in effect (meaning this test can be used) for the duration of the COVID-19 declaration under Section 564(b)(1) of the Act, 21 U.S.C.section 360bbb-3(b)(1), unless the authorization is terminated  or revoked sooner.       Influenza A by PCR NEGATIVE NEGATIVE Final   Influenza B by PCR NEGATIVE NEGATIVE Final    Comment: (NOTE) The Xpert Xpress SARS-CoV-2/FLU/RSV plus assay is intended as an aid in the diagnosis of influenza from Nasopharyngeal swab specimens and should not be used as a sole basis for treatment. Nasal washings  and aspirates are unacceptable for Xpert Xpress SARS-CoV-2/FLU/RSV testing.  Fact Sheet for Patients: bloggercourse.com  Fact Sheet for Healthcare Providers: seriousbroker.it  This test is not yet approved or cleared by the [place]  FDA and has been authorized for detection and/or diagnosis of SARS-CoV-2 by FDA under an Emergency Use Authorization (EUA). This EUA will remain in effect (meaning this test can be used) for the duration of the COVID-19 declaration under Section 564(b)(1) of the Act, 21 U.S.C. section 360bbb-3(b)(1), unless the authorization is terminated or revoked.     Resp Syncytial Virus by PCR NEGATIVE NEGATIVE Final    Comment: (NOTE) Fact Sheet for Patients: bloggercourse.com  Fact Sheet for Healthcare Providers: seriousbroker.it  This test is not yet approved or cleared by the [place]  FDA and has been authorized for detection and/or diagnosis of SARS-CoV-2 by FDA under an Emergency Use Authorization (EUA). This EUA will remain in effect (meaning this test can be used) for the duration of the COVID-19 declaration under Section 564(b)(1) of the Act, 21 U.S.C. section 360bbb-3(b)(1), unless the authorization is terminated or revoked.  Performed at [facility], [address] [place], [place] [address]   Urine Culture     Status: None   Collection Time: 03/25/24  5:28 PM   Specimen: Urine, Random  Result Value Ref Range Status   Specimen Description   Final    URINE, RANDOM Performed at [facility], [address], [place], [place] [address]    Special Requests   Final    NONE Reflexed from [id number] Performed at [facility], [address] [place], [place] [address]    Culture   Final    NO GROWTH Performed at [facility], [address], [place], [place] [address]    Report Status 03/26/2024 FINAL  Final  Culture, blood (routine x  2)     Status: None   Collection Time: 03/25/24  5:29 PM   Specimen: BLOOD  Result Value Ref Range Status   Specimen Description BLOOD LEFT ANTECUBITAL  Final   Special Requests   Final    BOTTLES DRAWN AEROBIC AND ANAEROBIC Blood Culture adequate volume   Culture   Final    NO GROWTH 5 DAYS Performed at [facility], [address] [place], [place] [address]    Report Status 03/30/2024 FINAL  Final  Culture, blood (routine x 2)     Status: None   Collection Time: 03/25/24  5:41 PM   Specimen: BLOOD LEFT FOREARM  Result Value Ref Range Status  Specimen Description BLOOD LEFT FOREARM  Final   Special Requests   Final    BOTTLES DRAWN AEROBIC AND ANAEROBIC Blood Culture adequate volume   Culture   Final    NO GROWTH 5 DAYS Performed at [facility], [address], [place], [place] [address]    Report Status 03/30/2024 FINAL  Final     Labs: BNP (last 3 results) Recent Labs    01/10/24 0823 02/15/24 1648  BNP 544.0* 531.0*   Basic Metabolic Panel: Recent Labs  Lab 03/25/24 1729 03/26/24 0436 03/28/24 0457 03/29/24 0510  NA 136 136 135  --   K 3.9 3.2* 3.5  --   CL 91* 93* 92*  --   CO2 30 28 26   --   GLUCOSE 181* 158* 145*  --   BUN 106* 100* 106*  --   CREATININE 2.06* 1.91* 2.19* 2.11*  CALCIUM  9.4 8.9 9.1  --   MG  --   --  2.4  --    Liver Function Tests: Recent Labs  Lab 03/25/24 1729  AST 40  ALT 33  ALKPHOS 164*  BILITOT 1.1  PROT 7.0  ALBUMIN  4.2   No results for input(s): LIPASE, AMYLASE in the last 168 hours. No results for input(s): AMMONIA in the last 168 hours. CBC: Recent Labs  Lab 03/25/24 1729 03/26/24 0436 03/28/24 0457  WBC 6.1 4.9 5.9  HGB 10.7* 10.0* 10.1*  HCT 32.0* 29.1* 30.5*  MCV 103.9* 101.4* 102.3*  PLT 131* 117* 112*   Cardiac Enzymes: No results for input(s): CKTOTAL, CKMB, CKMBINDEX, TROPONINI in the last 168 hours. BNP: Invalid input(s): POCBNP CBG: Recent Labs  Lab 03/29/24 0725  03/29/24 1105 03/29/24 1609 03/29/24 1941 03/30/24 0746  GLUCAP 132* 166* 138* 162* 125*   D-Dimer No results for input(s): DDIMER in the last 72 hours. Hgb A1c No results for input(s): HGBA1C in the last 72 hours. Lipid Profile No results for input(s): CHOL, HDL, LDLCALC, TRIG, CHOLHDL, LDLDIRECT in the last 72 hours. Thyroid  function studies No results for input(s): TSH, T4TOTAL, T3FREE, THYROIDAB in the last 72 hours.  Invalid input(s): FREET3 Anemia work up No results for input(s): VITAMINB12, FOLATE, FERRITIN, TIBC, IRON, RETICCTPCT in the last 72 hours. Urinalysis    Component Value Date/Time   COLORURINE YELLOW 03/25/2024 1728   APPEARANCEUR CLEAR 03/25/2024 1728   LABSPEC 1.011 03/25/2024 1728   PHURINE 6.0 03/25/2024 1728   GLUCOSEU NEGATIVE 03/25/2024 1728   HGBUR SMALL (A) 03/25/2024 1728   BILIRUBINUR NEGATIVE 03/25/2024 1728   KETONESUR NEGATIVE 03/25/2024 1728   PROTEINUR NEGATIVE 03/25/2024 1728   NITRITE NEGATIVE 03/25/2024 1728   LEUKOCYTESUR SMALL (A) 03/25/2024 1728   Sepsis Labs Recent Labs  Lab 03/25/24 1729 03/26/24 0436 03/28/24 0457  WBC 6.1 4.9 5.9   Microbiology Recent Results (from the past 240 hours)  Resp panel by RT-PCR (RSV, Flu A&B, Covid) Urine, Clean Catch     Status: None   Collection Time: 03/25/24  5:28 PM   Specimen: Urine, Clean Catch; Nasal Swab  Result Value Ref Range Status   SARS Coronavirus 2 by RT PCR NEGATIVE NEGATIVE Final    Comment: (NOTE) SARS-CoV-2 target nucleic acids are NOT DETECTED.  The SARS-CoV-2 RNA is generally detectable in upper respiratory specimens during the acute phase of infection. The lowest concentration of SARS-CoV-2 viral copies this assay can detect is 138 copies/mL. A negative result does not preclude SARS-Cov-2 infection and should not be used as the sole basis for  treatment or other patient management decisions. A negative result may occur with   improper specimen collection/handling, submission of specimen other than nasopharyngeal swab, presence of viral mutation(s) within the areas targeted by this assay, and inadequate number of viral copies(<138 copies/mL). A negative result must be combined with clinical observations, patient history, and epidemiological information. The expected result is Negative.  Fact Sheet for Patients:  bloggercourse.com  Fact Sheet for Healthcare Providers:  seriousbroker.it  This test is no t yet approved or cleared by the [place]  FDA and  has been authorized for detection and/or diagnosis of SARS-CoV-2 by FDA under an Emergency Use Authorization (EUA). This EUA will remain  in effect (meaning this test can be used) for the duration of the COVID-19 declaration under Section 564(b)(1) of the Act, 21 U.S.C.section 360bbb-3(b)(1), unless the authorization is terminated  or revoked sooner.       Influenza A by PCR NEGATIVE NEGATIVE Final   Influenza B by PCR NEGATIVE NEGATIVE Final    Comment: (NOTE) The Xpert Xpress SARS-CoV-2/FLU/RSV plus assay is intended as an aid in the diagnosis of influenza from Nasopharyngeal swab specimens and should not be used as a sole basis for treatment. Nasal washings and aspirates are unacceptable for Xpert Xpress SARS-CoV-2/FLU/RSV testing.  Fact Sheet for Patients: bloggercourse.com  Fact Sheet for Healthcare Providers: seriousbroker.it  This test is not yet approved or cleared by the [place]  FDA and has been authorized for detection and/or diagnosis of SARS-CoV-2 by FDA under an Emergency Use Authorization (EUA). This EUA will remain in effect (meaning this test can be used) for the duration of the COVID-19 declaration under Section 564(b)(1) of the Act, 21 U.S.C. section 360bbb-3(b)(1), unless the authorization is terminated or revoked.      Resp Syncytial Virus by PCR NEGATIVE NEGATIVE Final    Comment: (NOTE) Fact Sheet for Patients: bloggercourse.com  Fact Sheet for Healthcare Providers: seriousbroker.it  This test is not yet approved or cleared by the [place]  FDA and has been authorized for detection and/or diagnosis of SARS-CoV-2 by FDA under an Emergency Use Authorization (EUA). This EUA will remain in effect (meaning this test can be used) for the duration of the COVID-19 declaration under Section 564(b)(1) of the Act, 21 U.S.C. section 360bbb-3(b)(1), unless the authorization is terminated or revoked.  Performed at [facility], [address], [place], [place] [address]   Urine Culture     Status: None   Collection Time: 03/25/24  5:28 PM   Specimen: Urine, Random  Result Value Ref Range Status   Specimen Description   Final    URINE, RANDOM Performed at [facility], [address], [place], [place] [address]    Special Requests   Final    NONE Reflexed from [id number] Performed at [facility], [address], [place], [place] [address]    Culture   Final    NO GROWTH Performed at [facility], [address], [place], [place] [address]    Report Status 03/26/2024 FINAL  Final  Culture, blood (routine x 2)     Status: None   Collection Time: 03/25/24  5:29 PM   Specimen: BLOOD  Result Value Ref Range Status   Specimen Description BLOOD LEFT ANTECUBITAL  Final   Special Requests   Final    BOTTLES DRAWN AEROBIC AND ANAEROBIC Blood Culture adequate volume   Culture   Final    NO GROWTH 5 DAYS Performed at [facility], [address], [place],  [place] [address]    Report Status 03/30/2024 FINAL  Final  Culture, blood (routine x 2)     Status: None   Collection Time: 03/25/24  5:41 PM   Specimen: BLOOD LEFT FOREARM  Result Value Ref Range Status   Specimen Description BLOOD LEFT FOREARM  Final   Special Requests   Final    BOTTLES DRAWN  AEROBIC AND ANAEROBIC Blood Culture adequate volume   Culture   Final    NO GROWTH 5 DAYS Performed at [facility], [address] [place], [place] [address]    Report Status 03/30/2024 FINAL  Final     Time coordinating discharge: 35 minutes  SIGNED:   Adron JONETTA Fairly, DO [facility] 03/30/2024, 10:28 AM  If 7PM-7AM, please contact night-coverage [email or web address]

## 2024-03-30 NOTE — Plan of Care (Signed)

## 2024-03-30 NOTE — Plan of Care (Signed)
 Problem: Education: Goal: Knowledge of General Education information will improve Description: Including pain rating scale, medication(s)/side effects and non-pharmacologic comfort measures 03/30/2024 1208 by Mathews Norleen POUR, RN Outcome: Adequate for Discharge 03/30/2024 0956 by Mathews Norleen POUR, RN Outcome: Progressing   Problem: Health Behavior/Discharge Planning: Goal: Ability to manage health-related needs will improve 03/30/2024 1208 by Mathews Norleen POUR, RN Outcome: Adequate for Discharge 03/30/2024 0956 by Mathews Norleen POUR, RN Outcome: Progressing   Problem: Clinical Measurements: Goal: Ability to maintain clinical measurements within normal limits will improve 03/30/2024 1208 by Mathews Norleen POUR, RN Outcome: Adequate for Discharge 03/30/2024 0956 by Mathews Norleen POUR, RN Outcome: Progressing Goal: Will remain free from infection 03/30/2024 1208 by Mathews Norleen POUR, RN Outcome: Adequate for Discharge 03/30/2024 0956 by Mathews Norleen POUR, RN Outcome: Progressing Goal: Diagnostic test results will improve 03/30/2024 1208 by Mathews Norleen POUR, RN Outcome: Adequate for Discharge 03/30/2024 0956 by Mathews Norleen POUR, RN Outcome: Progressing Goal: Respiratory complications will improve 03/30/2024 1208 by Mathews Norleen POUR, RN Outcome: Adequate for Discharge 03/30/2024 [phone] by Mathews Norleen POUR, RN Outcome: Progressing Goal: Cardiovascular complication will be avoided 03/30/2024 1208 by Mathews Norleen POUR, RN Outcome: Adequate for Discharge 03/30/2024 0956 by Mathews Norleen POUR, RN Outcome: Progressing   Problem: Activity: Goal: Risk for activity intolerance will decrease 03/30/2024 1208 by Mathews Norleen POUR, RN Outcome: Adequate for Discharge 03/30/2024 [phone] by Mathews Norleen POUR, RN Outcome: Progressing   Problem: Nutrition: Goal: Adequate nutrition will be maintained 03/30/2024 1208 by Mathews Norleen POUR, RN Outcome: Adequate for Discharge 03/30/2024 0956 by Mathews Norleen POUR, RN Outcome: Progressing   Problem:  Coping: Goal: Level of anxiety will decrease 03/30/2024 1208 by Mathews Norleen POUR, RN Outcome: Adequate for Discharge 03/30/2024 0956 by Mathews Norleen POUR, RN Outcome: Progressing   Problem: Elimination: Goal: Will not experience complications related to bowel motility 03/30/2024 1208 by Mathews Norleen POUR, RN Outcome: Adequate for Discharge 03/30/2024 0956 by Mathews Norleen POUR, RN Outcome: Progressing Goal: Will not experience complications related to urinary retention 03/30/2024 1208 by Mathews Norleen POUR, RN Outcome: Adequate for Discharge 03/30/2024 0956 by Mathews Norleen POUR, RN Outcome: Progressing   Problem: Pain Managment: Goal: General experience of comfort will improve and/or be controlled 03/30/2024 1208 by Mathews Norleen POUR, RN Outcome: Adequate for Discharge 03/30/2024 0956 by Mathews Norleen POUR, RN Outcome: Progressing   Problem: Safety: Goal: Ability to remain free from injury will improve 03/30/2024 1208 by Mathews Norleen POUR, RN Outcome: Adequate for Discharge 03/30/2024 0956 by Mathews Norleen POUR, RN Outcome: Progressing   Problem: Skin Integrity: Goal: Risk for impaired skin integrity will decrease 03/30/2024 1208 by Mathews Norleen POUR, RN Outcome: Adequate for Discharge 03/30/2024 0956 by Mathews Norleen POUR, RN Outcome: Progressing   Problem: Clinical Measurements: Goal: Ability to avoid or minimize complications of infection will improve 03/30/2024 1208 by Mathews Norleen POUR, RN Outcome: Adequate for Discharge 03/30/2024 0956 by Mathews Norleen POUR, RN Outcome: Progressing   Problem: Skin Integrity: Goal: Skin integrity will improve 03/30/2024 1208 by Mathews Norleen POUR, RN Outcome: Adequate for Discharge 03/30/2024 0956 by Mathews Norleen POUR, RN Outcome: Progressing   Problem: Education: Goal: Ability to describe self-care measures that may prevent or decrease complications (Diabetes Survival Skills Education) will improve 03/30/2024 1208 by Mathews Norleen POUR, RN Outcome: Adequate for Discharge 03/30/2024 0956 by  Mathews Norleen POUR, RN Outcome: Progressing Goal: Individualized Educational Video(s) 03/30/2024 1208 by Mathews Norleen POUR, RN Outcome: Adequate for Discharge 03/30/2024 [phone] by Mathews Norleen POUR, RN  Outcome: Progressing   Problem: Coping: Goal: Ability to adjust to condition or change in health will improve 03/30/2024 1208 by Mathews Norleen POUR, RN Outcome: Adequate for Discharge 03/30/2024 0956 by Mathews Norleen POUR, RN Outcome: Progressing   Problem: Fluid Volume: Goal: Ability to maintain a balanced intake and output will improve 03/30/2024 1208 by Mathews Norleen POUR, RN Outcome: Adequate for Discharge 03/30/2024 0956 by Mathews Norleen POUR, RN Outcome: Progressing   Problem: Health Behavior/Discharge Planning: Goal: Ability to identify and utilize available resources and services will improve 03/30/2024 1208 by Mathews Norleen POUR, RN Outcome: Adequate for Discharge 03/30/2024 0956 by Mathews Norleen POUR, RN Outcome: Progressing Goal: Ability to manage health-related needs will improve 03/30/2024 1208 by Mathews Norleen POUR, RN Outcome: Adequate for Discharge 03/30/2024 0956 by Mathews Norleen POUR, RN Outcome: Progressing   Problem: Metabolic: Goal: Ability to maintain appropriate glucose levels will improve 03/30/2024 1208 by Mathews Norleen POUR, RN Outcome: Adequate for Discharge 03/30/2024 0956 by Mathews Norleen POUR, RN Outcome: Progressing   Problem: Nutritional: Goal: Maintenance of adequate nutrition will improve 03/30/2024 1208 by Mathews Norleen POUR, RN Outcome: Adequate for Discharge 03/30/2024 [phone] by Mathews Norleen POUR, RN Outcome: Progressing Goal: Progress toward achieving an optimal weight will improve 03/30/2024 1208 by Mathews Norleen POUR, RN Outcome: Adequate for Discharge 03/30/2024 [phone] by Mathews Norleen POUR, RN Outcome: Progressing   Problem: Skin Integrity: Goal: Risk for impaired skin integrity will decrease 03/30/2024 1208 by Mathews Norleen POUR, RN Outcome: Adequate for Discharge 03/30/2024 0956 by Mathews Norleen POUR, RN Outcome:  Progressing   Problem: Tissue Perfusion: Goal: Adequacy of tissue perfusion will improve 03/30/2024 1208 by Mathews Norleen POUR, RN Outcome: Adequate for Discharge 03/30/2024 0956 by Mathews Norleen POUR, RN Outcome: Progressing

## 2024-03-30 NOTE — TOC Transition Note (Signed)
 Transition of Care [facility]) - Discharge Note   Patient Details  Name: William Walker MRN: [id number] Date of Birth: Jun 19, 1926  Transition of Care [facility]) CM/SW Contact:  Mcarthur Saddie Kim, LCSW Phone Number: 03/30/2024, 11:24 AM   Clinical Narrative:  Pt d/c today to [facility] Pt's daughter Suzen and facility aware and agreeable. SNF authorization received. D/C summary sent to SNF.  RN given number to call report. Pt will transport via [facility].      Final next level of care: Skilled Nursing Facility Barriers to Discharge: Barriers Resolved   Patient Goals and CMS Choice Patient states their goals for this hospitalization and ongoing recovery are:: Patient wants to return home - family thinks he will benefit from SNF for ST rehab CMS Medicare.gov Compare Post Acute Care list provided to:: Patient Represenative (must comment) (Daughter- Kimberley) Choice offered to / list presented to : Adult Children [place] ownership interest in [facility].provided to:: Adult Children    Discharge Placement                Patient to be transferred to facility by: [facility] Name of family member notified: daughterGLENWOOD Suzen Patient and family notified of of transfer: 03/30/24  Discharge Plan and Services Additional resources added to the After Visit Summary for   In-house Referral: Clinical Social Work Discharge Planning Services: CM Consult Post Acute Care Choice: Durable Medical Equipment, Home Health                               Social Drivers of Health (SDOH) Interventions SDOH Screenings   Food Insecurity: No Food Insecurity (03/25/2024)  Housing: Low Risk  (03/25/2024)  Transportation Needs: No Transportation Needs (03/25/2024)  Utilities: Not At Risk (03/25/2024)  Depression (PHQ2-9): High Risk (03/23/2024)  Financial Resource Strain: Low Risk  (03/23/2024)  Physical Activity: Inactive (03/23/2024)  Social Connections: Moderately  Integrated (03/25/2024)  Recent Concern: Social Connections - Moderately Isolated (03/23/2024)  Stress: Stress Concern Present (03/23/2024)  Tobacco Use: High Risk (03/25/2024)     Readmission Risk Interventions    03/26/2024    3:45 PM  Readmission Risk Prevention Plan  Transportation Screening Complete  Home Care Screening Complete  Medication Review (RN CM) Complete

## 2024-04-01 ENCOUNTER — Telehealth: Payer: Self-pay | Admitting: Physician Assistant

## 2024-04-01 NOTE — Telephone Encounter (Signed)
(  Son) William Walker is requesting a letter medical necessity of ADA for medical equipment. Son William Walker) states it was discussed at his first appointment with you. A sample letter is in your red folder.

## 2024-04-05 ENCOUNTER — Other Ambulatory Visit: Payer: Self-pay | Admitting: Cardiology

## 2024-04-05 DIAGNOSIS — I483 Typical atrial flutter: Secondary | ICD-10-CM

## 2024-04-06 ENCOUNTER — Encounter: Payer: Self-pay | Admitting: Physician Assistant

## 2024-04-06 NOTE — Telephone Encounter (Signed)
 Prescription refill request for Eliquis  received. Indication:aflutter Last office visit:9/25 Scr:2.19  10/25 Age: 89 Weight:90.6  kg  Prescription refilled

## 2024-04-17 ENCOUNTER — Other Ambulatory Visit: Payer: Self-pay | Admitting: Cardiology

## 2024-05-17 ENCOUNTER — Ambulatory Visit

## 2024-06-14 ENCOUNTER — Other Ambulatory Visit: Payer: Self-pay | Admitting: Physician Assistant

## 2024-06-14 DIAGNOSIS — N401 Enlarged prostate with lower urinary tract symptoms: Secondary | ICD-10-CM

## 2024-06-16 ENCOUNTER — Ambulatory Visit: Admitting: Cardiology

## 2024-06-23 ENCOUNTER — Ambulatory Visit: Admitting: Physician Assistant

## 2024-09-03 ENCOUNTER — Ambulatory Visit: Admitting: Urology
